# Patient Record
Sex: Female | Born: 1950 | Race: White | Hispanic: No | Smoking: Former smoker
Health system: Southern US, Community
[De-identification: ages and names within clinical notes are randomized; demographics above are authoritative.]

## PROBLEM LIST (undated history)

## (undated) DIAGNOSIS — I4891 Unspecified atrial fibrillation: Secondary | ICD-10-CM

## (undated) DIAGNOSIS — F419 Anxiety disorder, unspecified: Secondary | ICD-10-CM

## (undated) DIAGNOSIS — I495 Sick sinus syndrome: Secondary | ICD-10-CM

## (undated) DIAGNOSIS — C3490 Malignant neoplasm of unspecified part of unspecified bronchus or lung: Secondary | ICD-10-CM

## (undated) DIAGNOSIS — I219 Acute myocardial infarction, unspecified: Secondary | ICD-10-CM

## (undated) DIAGNOSIS — Z923 Personal history of irradiation: Secondary | ICD-10-CM

## (undated) DIAGNOSIS — J449 Chronic obstructive pulmonary disease, unspecified: Secondary | ICD-10-CM

## (undated) DIAGNOSIS — I2510 Atherosclerotic heart disease of native coronary artery without angina pectoris: Secondary | ICD-10-CM

## (undated) DIAGNOSIS — J45909 Unspecified asthma, uncomplicated: Secondary | ICD-10-CM

## (undated) DIAGNOSIS — C719 Malignant neoplasm of brain, unspecified: Secondary | ICD-10-CM

## (undated) DIAGNOSIS — T7840XA Allergy, unspecified, initial encounter: Secondary | ICD-10-CM

## (undated) DIAGNOSIS — E2740 Unspecified adrenocortical insufficiency: Secondary | ICD-10-CM

## (undated) DIAGNOSIS — R0602 Shortness of breath: Secondary | ICD-10-CM

## (undated) DIAGNOSIS — E782 Mixed hyperlipidemia: Secondary | ICD-10-CM

## (undated) DIAGNOSIS — I10 Essential (primary) hypertension: Secondary | ICD-10-CM

## (undated) DIAGNOSIS — I749 Embolism and thrombosis of unspecified artery: Secondary | ICD-10-CM

## (undated) HISTORY — DX: Allergy, unspecified, initial encounter: T78.40XA

## (undated) HISTORY — DX: Unspecified atrial fibrillation: I48.91

## (undated) HISTORY — DX: Sick sinus syndrome: I49.5

## (undated) HISTORY — DX: Essential (primary) hypertension: I10

## (undated) HISTORY — DX: Mixed hyperlipidemia: E78.2

## (undated) HISTORY — DX: Embolism and thrombosis of unspecified artery: I74.9

## (undated) HISTORY — DX: Malignant neoplasm of unspecified part of unspecified bronchus or lung: C34.90

## (undated) HISTORY — DX: Atherosclerotic heart disease of native coronary artery without angina pectoris: I25.10

## (undated) HISTORY — DX: Personal history of irradiation: Z92.3

## (undated) HISTORY — PX: TONSILLECTOMY AND ADENOIDECTOMY: SUR1326

## (undated) HISTORY — PX: APPENDECTOMY: SHX54

## (undated) HISTORY — DX: Chronic obstructive pulmonary disease, unspecified: J44.9

## (undated) HISTORY — DX: Acute myocardial infarction, unspecified: I21.9

## (undated) HISTORY — DX: Shortness of breath: R06.02

---

## 1973-10-08 HISTORY — PX: TOTAL ABDOMINAL HYSTERECTOMY: SHX209

## 1999-12-11 ENCOUNTER — Encounter: Payer: Self-pay | Admitting: *Deleted

## 1999-12-11 ENCOUNTER — Encounter: Admission: RE | Admit: 1999-12-11 | Discharge: 1999-12-11 | Payer: Self-pay | Admitting: *Deleted

## 2001-05-22 ENCOUNTER — Encounter: Payer: Self-pay | Admitting: *Deleted

## 2001-05-22 ENCOUNTER — Encounter: Admission: RE | Admit: 2001-05-22 | Discharge: 2001-05-22 | Payer: Self-pay | Admitting: Family Medicine

## 2006-11-19 ENCOUNTER — Inpatient Hospital Stay (HOSPITAL_COMMUNITY): Admission: EM | Admit: 2006-11-19 | Discharge: 2006-11-21 | Payer: Self-pay | Admitting: Emergency Medicine

## 2006-11-19 ENCOUNTER — Ambulatory Visit: Payer: Self-pay | Admitting: Internal Medicine

## 2006-11-19 ENCOUNTER — Encounter (INDEPENDENT_AMBULATORY_CARE_PROVIDER_SITE_OTHER): Payer: Self-pay | Admitting: Cardiology

## 2006-11-26 ENCOUNTER — Ambulatory Visit: Payer: Self-pay | Admitting: Cardiology

## 2006-11-28 ENCOUNTER — Ambulatory Visit: Payer: Self-pay

## 2006-12-04 ENCOUNTER — Ambulatory Visit: Payer: Self-pay | Admitting: Internal Medicine

## 2006-12-05 ENCOUNTER — Ambulatory Visit: Payer: Self-pay | Admitting: Cardiology

## 2006-12-19 ENCOUNTER — Ambulatory Visit: Payer: Self-pay | Admitting: Cardiology

## 2007-01-16 ENCOUNTER — Ambulatory Visit: Payer: Self-pay | Admitting: Cardiology

## 2007-02-21 ENCOUNTER — Emergency Department (HOSPITAL_COMMUNITY): Admission: EM | Admit: 2007-02-21 | Discharge: 2007-02-21 | Payer: Self-pay | Admitting: Emergency Medicine

## 2007-04-04 ENCOUNTER — Inpatient Hospital Stay (HOSPITAL_COMMUNITY): Admission: EM | Admit: 2007-04-04 | Discharge: 2007-04-08 | Payer: Self-pay | Admitting: Cardiology

## 2007-04-04 ENCOUNTER — Ambulatory Visit: Payer: Self-pay | Admitting: Internal Medicine

## 2007-04-30 ENCOUNTER — Ambulatory Visit: Payer: Self-pay

## 2007-05-16 ENCOUNTER — Ambulatory Visit: Payer: Self-pay | Admitting: Cardiovascular Disease

## 2007-05-25 ENCOUNTER — Ambulatory Visit: Payer: Self-pay | Admitting: Cardiovascular Disease

## 2007-05-29 ENCOUNTER — Ambulatory Visit: Payer: Self-pay | Admitting: Cardiovascular Disease

## 2007-07-03 ENCOUNTER — Ambulatory Visit: Payer: Self-pay | Admitting: Cardiovascular Disease

## 2007-08-22 ENCOUNTER — Ambulatory Visit: Payer: Self-pay | Admitting: Cardiovascular Disease

## 2007-09-11 ENCOUNTER — Ambulatory Visit: Payer: Self-pay | Admitting: Cardiology

## 2007-10-21 ENCOUNTER — Ambulatory Visit: Payer: Self-pay | Admitting: Cardiology

## 2007-10-30 ENCOUNTER — Ambulatory Visit (HOSPITAL_COMMUNITY): Admission: RE | Admit: 2007-10-30 | Discharge: 2007-10-30 | Payer: Self-pay | Admitting: Family Medicine

## 2007-11-18 ENCOUNTER — Ambulatory Visit: Payer: Self-pay | Admitting: Cardiology

## 2007-11-21 ENCOUNTER — Ambulatory Visit: Payer: Self-pay | Admitting: Cardiovascular Disease

## 2007-11-25 ENCOUNTER — Ambulatory Visit: Payer: Self-pay | Admitting: Cardiovascular Disease

## 2007-12-16 ENCOUNTER — Ambulatory Visit: Payer: Self-pay | Admitting: Cardiology

## 2007-12-20 ENCOUNTER — Ambulatory Visit: Payer: Self-pay | Admitting: *Deleted

## 2007-12-21 ENCOUNTER — Inpatient Hospital Stay (HOSPITAL_COMMUNITY): Admission: EM | Admit: 2007-12-21 | Discharge: 2007-12-21 | Payer: Self-pay | Admitting: Emergency Medicine

## 2007-12-25 ENCOUNTER — Ambulatory Visit: Payer: Self-pay | Admitting: Cardiology

## 2007-12-29 ENCOUNTER — Encounter (HOSPITAL_COMMUNITY): Admission: RE | Admit: 2007-12-29 | Discharge: 2008-01-28 | Payer: Self-pay | Admitting: Cardiology

## 2008-01-05 ENCOUNTER — Ambulatory Visit: Payer: Self-pay | Admitting: Cardiology

## 2008-01-14 ENCOUNTER — Ambulatory Visit: Payer: Self-pay | Admitting: Cardiology

## 2008-01-19 ENCOUNTER — Ambulatory Visit: Payer: Self-pay | Admitting: Cardiovascular Disease

## 2008-02-16 ENCOUNTER — Ambulatory Visit: Payer: Self-pay | Admitting: Cardiology

## 2008-02-19 ENCOUNTER — Ambulatory Visit: Payer: Self-pay | Admitting: Cardiovascular Disease

## 2008-03-15 ENCOUNTER — Ambulatory Visit: Payer: Self-pay | Admitting: Cardiology

## 2008-03-30 ENCOUNTER — Ambulatory Visit: Payer: Self-pay | Admitting: Cardiology

## 2008-03-30 ENCOUNTER — Inpatient Hospital Stay (HOSPITAL_COMMUNITY): Admission: EM | Admit: 2008-03-30 | Discharge: 2008-03-31 | Payer: Self-pay | Admitting: Emergency Medicine

## 2008-04-21 ENCOUNTER — Ambulatory Visit: Payer: Self-pay | Admitting: Cardiology

## 2008-05-12 ENCOUNTER — Ambulatory Visit: Payer: Self-pay | Admitting: Cardiology

## 2008-05-20 ENCOUNTER — Ambulatory Visit: Payer: Self-pay | Admitting: Cardiovascular Disease

## 2008-05-27 ENCOUNTER — Ambulatory Visit: Payer: Self-pay | Admitting: Cardiology

## 2008-06-07 ENCOUNTER — Ambulatory Visit: Payer: Self-pay | Admitting: Cardiology

## 2008-06-08 ENCOUNTER — Observation Stay (HOSPITAL_COMMUNITY): Admission: EM | Admit: 2008-06-08 | Discharge: 2008-06-08 | Payer: Self-pay | Admitting: Emergency Medicine

## 2008-06-17 ENCOUNTER — Ambulatory Visit: Payer: Self-pay | Admitting: Cardiology

## 2008-06-24 ENCOUNTER — Ambulatory Visit: Payer: Self-pay | Admitting: Cardiology

## 2008-06-28 ENCOUNTER — Encounter: Admission: RE | Admit: 2008-06-28 | Discharge: 2008-07-19 | Payer: Self-pay | Admitting: Physician Assistant

## 2008-06-29 ENCOUNTER — Emergency Department (HOSPITAL_COMMUNITY): Admission: EM | Admit: 2008-06-29 | Discharge: 2008-06-30 | Payer: Self-pay | Admitting: Emergency Medicine

## 2008-07-12 ENCOUNTER — Ambulatory Visit: Payer: Self-pay | Admitting: Cardiology

## 2008-08-05 ENCOUNTER — Ambulatory Visit: Payer: Self-pay | Admitting: Cardiology

## 2008-08-08 ENCOUNTER — Emergency Department (HOSPITAL_COMMUNITY): Admission: EM | Admit: 2008-08-08 | Discharge: 2008-08-08 | Payer: Self-pay | Admitting: Emergency Medicine

## 2008-08-10 ENCOUNTER — Emergency Department (HOSPITAL_COMMUNITY): Admission: EM | Admit: 2008-08-10 | Discharge: 2008-08-10 | Payer: Self-pay | Admitting: Emergency Medicine

## 2008-08-12 ENCOUNTER — Ambulatory Visit: Payer: Self-pay | Admitting: Cardiology

## 2008-08-17 ENCOUNTER — Ambulatory Visit: Payer: Self-pay | Admitting: Cardiology

## 2008-08-17 ENCOUNTER — Encounter (HOSPITAL_COMMUNITY): Admission: RE | Admit: 2008-08-17 | Discharge: 2008-09-16 | Payer: Self-pay | Admitting: Cardiology

## 2008-08-19 ENCOUNTER — Ambulatory Visit: Payer: Self-pay | Admitting: Cardiovascular Disease

## 2008-08-25 ENCOUNTER — Ambulatory Visit: Payer: Self-pay | Admitting: Cardiology

## 2008-09-01 ENCOUNTER — Ambulatory Visit: Payer: Self-pay | Admitting: Cardiology

## 2008-09-08 ENCOUNTER — Ambulatory Visit: Payer: Self-pay | Admitting: Cardiology

## 2008-09-13 ENCOUNTER — Ambulatory Visit: Payer: Self-pay | Admitting: Cardiology

## 2008-09-15 ENCOUNTER — Ambulatory Visit: Payer: Self-pay | Admitting: Internal Medicine

## 2008-11-01 ENCOUNTER — Ambulatory Visit: Payer: Self-pay | Admitting: Cardiology

## 2008-11-18 ENCOUNTER — Ambulatory Visit: Payer: Self-pay | Admitting: Cardiovascular Disease

## 2008-11-25 ENCOUNTER — Ambulatory Visit: Payer: Self-pay | Admitting: Cardiology

## 2008-11-25 DIAGNOSIS — I2510 Atherosclerotic heart disease of native coronary artery without angina pectoris: Secondary | ICD-10-CM | POA: Insufficient documentation

## 2008-11-25 DIAGNOSIS — I480 Paroxysmal atrial fibrillation: Secondary | ICD-10-CM | POA: Insufficient documentation

## 2008-11-29 ENCOUNTER — Ambulatory Visit: Payer: Self-pay | Admitting: Cardiology

## 2008-11-30 ENCOUNTER — Ambulatory Visit (HOSPITAL_COMMUNITY): Admission: RE | Admit: 2008-11-30 | Discharge: 2008-11-30 | Payer: Self-pay | Admitting: Family Medicine

## 2008-12-07 ENCOUNTER — Ambulatory Visit: Payer: Self-pay | Admitting: Internal Medicine

## 2009-01-06 ENCOUNTER — Ambulatory Visit: Payer: Self-pay | Admitting: Cardiology

## 2009-01-13 ENCOUNTER — Ambulatory Visit: Payer: Self-pay | Admitting: Cardiology

## 2009-01-24 ENCOUNTER — Encounter (INDEPENDENT_AMBULATORY_CARE_PROVIDER_SITE_OTHER): Payer: Self-pay | Admitting: *Deleted

## 2009-02-10 ENCOUNTER — Ambulatory Visit: Payer: Self-pay | Admitting: Cardiology

## 2009-02-18 ENCOUNTER — Encounter: Payer: Self-pay | Admitting: Cardiology

## 2009-02-23 ENCOUNTER — Ambulatory Visit: Payer: Self-pay | Admitting: Cardiovascular Disease

## 2009-03-10 ENCOUNTER — Ambulatory Visit: Payer: Self-pay | Admitting: Cardiology

## 2009-03-14 ENCOUNTER — Inpatient Hospital Stay (HOSPITAL_COMMUNITY): Admission: EM | Admit: 2009-03-14 | Discharge: 2009-03-15 | Payer: Self-pay | Admitting: Emergency Medicine

## 2009-03-14 ENCOUNTER — Ambulatory Visit: Payer: Self-pay | Admitting: Cardiology

## 2009-03-14 ENCOUNTER — Encounter (INDEPENDENT_AMBULATORY_CARE_PROVIDER_SITE_OTHER): Payer: Self-pay | Admitting: Internal Medicine

## 2009-03-31 ENCOUNTER — Emergency Department (HOSPITAL_COMMUNITY): Admission: EM | Admit: 2009-03-31 | Discharge: 2009-03-31 | Payer: Self-pay | Admitting: Emergency Medicine

## 2009-04-07 ENCOUNTER — Ambulatory Visit: Payer: Self-pay | Admitting: Cardiology

## 2009-04-18 ENCOUNTER — Telehealth (INDEPENDENT_AMBULATORY_CARE_PROVIDER_SITE_OTHER): Payer: Self-pay | Admitting: *Deleted

## 2009-04-20 ENCOUNTER — Telehealth (INDEPENDENT_AMBULATORY_CARE_PROVIDER_SITE_OTHER): Payer: Self-pay | Admitting: *Deleted

## 2009-04-25 ENCOUNTER — Ambulatory Visit: Payer: Self-pay | Admitting: Cardiology

## 2009-05-04 ENCOUNTER — Encounter: Payer: Self-pay | Admitting: Cardiovascular Disease

## 2009-05-05 ENCOUNTER — Encounter: Payer: Self-pay | Admitting: Cardiology

## 2009-05-23 ENCOUNTER — Encounter: Payer: Self-pay | Admitting: *Deleted

## 2009-05-25 ENCOUNTER — Encounter: Payer: Self-pay | Admitting: Physician Assistant

## 2009-05-25 ENCOUNTER — Ambulatory Visit: Payer: Self-pay | Admitting: Cardiovascular Disease

## 2009-05-25 ENCOUNTER — Ambulatory Visit: Payer: Self-pay | Admitting: Cardiology

## 2009-05-25 ENCOUNTER — Encounter: Payer: Self-pay | Admitting: Cardiology

## 2009-05-26 LAB — CONVERTED CEMR LAB
BUN: 16 mg/dL (ref 6–23)
Basophils Absolute: 0 10*3/uL (ref 0.0–0.1)
CO2: 31 meq/L (ref 19–32)
Calcium: 9.2 mg/dL (ref 8.4–10.5)
Creatinine, Ser: 0.8 mg/dL (ref 0.4–1.2)
Eosinophils Absolute: 0.3 10*3/uL (ref 0.0–0.7)
Eosinophils Relative: 2.8 % (ref 0.0–5.0)
GFR calc non Af Amer: 77.92 mL/min (ref >60)
Glucose, Bld: 111 mg/dL — ABNORMAL HIGH (ref 70–99)
Lymphocytes Relative: 16.6 % (ref 12.0–46.0)
Lymphs Abs: 2 10*3/uL (ref 0.7–4.0)
MCHC: 33.2 g/dL (ref 30.0–36.0)
Monocytes Absolute: 0.9 10*3/uL (ref 0.1–1.0)
Monocytes Relative: 7.1 % (ref 3.0–12.0)
Platelets: 176 10*3/uL (ref 150.0–400.0)
Prothrombin Time: 32.2 s — ABNORMAL HIGH (ref 9.1–11.7)
RBC: 4.51 M/uL (ref 3.87–5.11)
Sodium: 145 meq/L (ref 135–145)
WBC: 12.2 10*3/uL — ABNORMAL HIGH (ref 4.5–10.5)

## 2009-05-30 ENCOUNTER — Ambulatory Visit: Payer: Self-pay | Admitting: Cardiology

## 2009-05-31 ENCOUNTER — Inpatient Hospital Stay (HOSPITAL_BASED_OUTPATIENT_CLINIC_OR_DEPARTMENT_OTHER): Admission: RE | Admit: 2009-05-31 | Discharge: 2009-05-31 | Payer: Self-pay | Admitting: Cardiology

## 2009-05-31 ENCOUNTER — Ambulatory Visit: Payer: Self-pay | Admitting: Cardiology

## 2009-06-03 ENCOUNTER — Telehealth: Payer: Self-pay | Admitting: Cardiology

## 2009-06-15 ENCOUNTER — Ambulatory Visit: Payer: Self-pay | Admitting: Cardiovascular Disease

## 2009-06-15 ENCOUNTER — Ambulatory Visit: Payer: Self-pay | Admitting: Cardiology

## 2009-06-15 DIAGNOSIS — F17200 Nicotine dependence, unspecified, uncomplicated: Secondary | ICD-10-CM

## 2009-06-15 DIAGNOSIS — E663 Overweight: Secondary | ICD-10-CM | POA: Insufficient documentation

## 2009-06-15 LAB — CONVERTED CEMR LAB
ALT: 27 units/L (ref 0–35)
Alkaline Phosphatase: 62 units/L (ref 39–117)
Bilirubin, Direct: 0 mg/dL (ref 0.0–0.3)
HDL: 41.4 mg/dL (ref >39.00)
Total Protein: 6.4 g/dL (ref 6.0–8.3)
VLDL: 25 mg/dL (ref 0.0–40.0)

## 2009-06-17 ENCOUNTER — Encounter: Payer: Self-pay | Admitting: Cardiology

## 2009-06-17 ENCOUNTER — Ambulatory Visit: Payer: Self-pay | Admitting: Cardiology

## 2009-06-17 DIAGNOSIS — E785 Hyperlipidemia, unspecified: Secondary | ICD-10-CM

## 2009-07-28 ENCOUNTER — Encounter (INDEPENDENT_AMBULATORY_CARE_PROVIDER_SITE_OTHER): Payer: Self-pay | Admitting: Cardiology

## 2009-08-04 ENCOUNTER — Telehealth (INDEPENDENT_AMBULATORY_CARE_PROVIDER_SITE_OTHER): Payer: Self-pay | Admitting: *Deleted

## 2009-08-08 ENCOUNTER — Ambulatory Visit: Payer: Self-pay | Admitting: Cardiology

## 2009-08-08 LAB — CONVERTED CEMR LAB: POC INR: 3.3

## 2009-09-13 ENCOUNTER — Encounter: Payer: Self-pay | Admitting: Cardiology

## 2009-09-19 ENCOUNTER — Encounter (INDEPENDENT_AMBULATORY_CARE_PROVIDER_SITE_OTHER): Payer: Self-pay | Admitting: *Deleted

## 2009-09-19 ENCOUNTER — Encounter: Payer: Self-pay | Admitting: Cardiology

## 2009-09-19 LAB — CONVERTED CEMR LAB
ALT: 19 units/L
ALT: 19 units/L
Albumin: 4.3 g/dL
Cholesterol: 116 mg/dL
HDL: 41 mg/dL
LDL Cholesterol: 46 mg/dL
Triglycerides: 143 mg/dL

## 2009-09-20 LAB — CONVERTED CEMR LAB
Alkaline Phosphatase: 58 units/L (ref 39–117)
Cholesterol: 116 mg/dL (ref 0–200)
HDL: 41 mg/dL (ref >39)
Indirect Bilirubin: 0.3 mg/dL (ref 0.0–0.9)
Total Bilirubin: 0.4 mg/dL (ref 0.3–1.2)
Total CHOL/HDL Ratio: 2.8
Total Protein: 6.6 g/dL (ref 6.0–8.3)
VLDL: 29 mg/dL (ref 0–40)

## 2009-09-21 ENCOUNTER — Encounter (INDEPENDENT_AMBULATORY_CARE_PROVIDER_SITE_OTHER): Payer: Self-pay | Admitting: *Deleted

## 2009-09-22 ENCOUNTER — Encounter (INDEPENDENT_AMBULATORY_CARE_PROVIDER_SITE_OTHER): Payer: Self-pay | Admitting: Cardiology

## 2009-10-02 ENCOUNTER — Emergency Department (HOSPITAL_COMMUNITY): Admission: EM | Admit: 2009-10-02 | Discharge: 2009-10-02 | Payer: Self-pay | Admitting: Emergency Medicine

## 2009-10-03 ENCOUNTER — Emergency Department (HOSPITAL_COMMUNITY): Admission: EM | Admit: 2009-10-03 | Discharge: 2009-10-03 | Payer: Self-pay | Admitting: Emergency Medicine

## 2009-10-24 ENCOUNTER — Encounter (INDEPENDENT_AMBULATORY_CARE_PROVIDER_SITE_OTHER): Payer: Self-pay | Admitting: *Deleted

## 2009-10-26 ENCOUNTER — Encounter (INDEPENDENT_AMBULATORY_CARE_PROVIDER_SITE_OTHER): Payer: Self-pay | Admitting: Cardiology

## 2009-11-01 ENCOUNTER — Ambulatory Visit: Payer: Self-pay | Admitting: Cardiology

## 2009-11-01 DIAGNOSIS — J449 Chronic obstructive pulmonary disease, unspecified: Secondary | ICD-10-CM

## 2009-11-02 ENCOUNTER — Encounter: Payer: Self-pay | Admitting: Adult Health

## 2009-11-10 ENCOUNTER — Encounter: Payer: Self-pay | Admitting: Cardiology

## 2009-11-14 ENCOUNTER — Ambulatory Visit: Payer: Self-pay | Admitting: Cardiology

## 2009-11-14 LAB — CONVERTED CEMR LAB: POC INR: 2.9

## 2009-11-16 ENCOUNTER — Telehealth (INDEPENDENT_AMBULATORY_CARE_PROVIDER_SITE_OTHER): Payer: Self-pay | Admitting: *Deleted

## 2009-11-17 ENCOUNTER — Encounter (INDEPENDENT_AMBULATORY_CARE_PROVIDER_SITE_OTHER): Payer: Self-pay | Admitting: *Deleted

## 2009-11-25 ENCOUNTER — Encounter: Payer: Self-pay | Admitting: Cardiology

## 2009-11-25 ENCOUNTER — Ambulatory Visit: Payer: Self-pay | Admitting: Cardiology

## 2009-11-29 ENCOUNTER — Telehealth: Payer: Self-pay | Admitting: Cardiology

## 2009-11-29 ENCOUNTER — Ambulatory Visit: Payer: Self-pay | Admitting: Cardiology

## 2009-12-01 ENCOUNTER — Ambulatory Visit (HOSPITAL_COMMUNITY): Admission: RE | Admit: 2009-12-01 | Discharge: 2009-12-01 | Payer: Self-pay | Admitting: Cardiology

## 2009-12-05 ENCOUNTER — Encounter: Payer: Self-pay | Admitting: Cardiology

## 2009-12-05 ENCOUNTER — Ambulatory Visit: Payer: Self-pay | Admitting: Internal Medicine

## 2009-12-06 HISTORY — PX: INSERT / REPLACE / REMOVE PACEMAKER: SUR710

## 2009-12-08 LAB — CONVERTED CEMR LAB
BUN: 15 mg/dL (ref 6–23)
Chloride: 102 meq/L (ref 96–112)
Creatinine, Ser: 0.88 mg/dL (ref 0.40–1.20)
Eosinophils Absolute: 0.2 10*3/uL (ref 0.0–0.7)
Eosinophils Relative: 2 % (ref 0–5)
Glucose, Bld: 96 mg/dL (ref 70–99)
INR: 3.42 — ABNORMAL HIGH (ref <1.50)
Lymphocytes Relative: 20 % (ref 12–46)
Lymphs Abs: 2.2 10*3/uL (ref 0.7–4.0)
Monocytes Absolute: 0.7 10*3/uL (ref 0.1–1.0)
Monocytes Relative: 6 % (ref 3–12)
Neutro Abs: 8.3 10*3/uL — ABNORMAL HIGH (ref 1.7–7.7)
Neutrophils Relative %: 72 % (ref 43–77)
Platelets: 198 10*3/uL (ref 150–400)
RDW: 14.7 % (ref 11.5–15.5)
Sodium: 139 meq/L (ref 135–145)
WBC: 11.5 10*3/uL — ABNORMAL HIGH (ref 4.0–10.5)
aPTT: 47 s — ABNORMAL HIGH (ref 24–37)

## 2009-12-09 ENCOUNTER — Ambulatory Visit: Payer: Self-pay | Admitting: Internal Medicine

## 2009-12-09 ENCOUNTER — Inpatient Hospital Stay (HOSPITAL_COMMUNITY): Admission: RE | Admit: 2009-12-09 | Discharge: 2009-12-10 | Payer: Self-pay | Admitting: Internal Medicine

## 2009-12-10 ENCOUNTER — Encounter: Payer: Self-pay | Admitting: Internal Medicine

## 2009-12-19 ENCOUNTER — Ambulatory Visit: Payer: Self-pay | Admitting: Cardiology

## 2009-12-22 ENCOUNTER — Encounter: Payer: Self-pay | Admitting: Internal Medicine

## 2009-12-29 ENCOUNTER — Ambulatory Visit: Payer: Self-pay | Admitting: Cardiology

## 2009-12-29 ENCOUNTER — Ambulatory Visit: Payer: Self-pay | Admitting: Internal Medicine

## 2009-12-29 LAB — CONVERTED CEMR LAB: POC INR: 2.5

## 2010-01-23 ENCOUNTER — Ambulatory Visit: Payer: Self-pay | Admitting: Cardiovascular Disease

## 2010-01-23 LAB — CONVERTED CEMR LAB: POC INR: 2.5

## 2010-02-20 ENCOUNTER — Ambulatory Visit: Payer: Self-pay | Admitting: Cardiology

## 2010-02-21 ENCOUNTER — Encounter (INDEPENDENT_AMBULATORY_CARE_PROVIDER_SITE_OTHER): Payer: Self-pay | Admitting: *Deleted

## 2010-03-08 ENCOUNTER — Ambulatory Visit: Payer: Self-pay | Admitting: Cardiology

## 2010-03-15 ENCOUNTER — Ambulatory Visit: Payer: Self-pay | Admitting: Internal Medicine

## 2010-03-15 DIAGNOSIS — Z950 Presence of cardiac pacemaker: Secondary | ICD-10-CM | POA: Insufficient documentation

## 2010-03-16 ENCOUNTER — Encounter: Payer: Self-pay | Admitting: Internal Medicine

## 2010-03-27 ENCOUNTER — Encounter (INDEPENDENT_AMBULATORY_CARE_PROVIDER_SITE_OTHER): Payer: Self-pay | Admitting: *Deleted

## 2010-04-06 ENCOUNTER — Encounter (INDEPENDENT_AMBULATORY_CARE_PROVIDER_SITE_OTHER): Payer: Self-pay | Admitting: Pharmacist

## 2010-04-13 ENCOUNTER — Ambulatory Visit: Payer: Self-pay | Admitting: Cardiology

## 2010-04-13 DIAGNOSIS — I495 Sick sinus syndrome: Secondary | ICD-10-CM | POA: Insufficient documentation

## 2010-04-18 ENCOUNTER — Encounter (INDEPENDENT_AMBULATORY_CARE_PROVIDER_SITE_OTHER): Payer: Self-pay

## 2010-05-24 ENCOUNTER — Encounter (INDEPENDENT_AMBULATORY_CARE_PROVIDER_SITE_OTHER): Payer: Self-pay | Admitting: Pharmacist

## 2010-05-24 ENCOUNTER — Telehealth (INDEPENDENT_AMBULATORY_CARE_PROVIDER_SITE_OTHER): Payer: Self-pay

## 2010-06-14 ENCOUNTER — Encounter (HOSPITAL_COMMUNITY): Admission: RE | Admit: 2010-06-14 | Discharge: 2010-07-14 | Payer: Self-pay | Admitting: Orthopaedic Surgery

## 2010-06-16 ENCOUNTER — Telehealth: Payer: Self-pay | Admitting: Internal Medicine

## 2010-06-21 ENCOUNTER — Ambulatory Visit: Payer: Self-pay | Admitting: Cardiology

## 2010-06-21 LAB — CONVERTED CEMR LAB: POC INR: 2.1

## 2010-07-19 ENCOUNTER — Ambulatory Visit: Payer: Self-pay | Admitting: Cardiology

## 2010-07-19 LAB — CONVERTED CEMR LAB: POC INR: 2.4

## 2010-08-17 ENCOUNTER — Ambulatory Visit: Payer: Self-pay | Admitting: Cardiology

## 2010-09-14 ENCOUNTER — Ambulatory Visit: Payer: Self-pay | Admitting: Cardiology

## 2010-09-14 LAB — CONVERTED CEMR LAB: POC INR: 2.2

## 2010-10-16 ENCOUNTER — Ambulatory Visit: Admission: RE | Admit: 2010-10-16 | Discharge: 2010-10-16 | Payer: Self-pay | Source: Home / Self Care

## 2010-10-16 LAB — CONVERTED CEMR LAB: POC INR: 2.3

## 2010-10-29 ENCOUNTER — Encounter: Payer: Self-pay | Admitting: Internal Medicine

## 2010-10-31 ENCOUNTER — Other Ambulatory Visit (HOSPITAL_COMMUNITY): Payer: Self-pay | Admitting: Family Medicine

## 2010-10-31 ENCOUNTER — Other Ambulatory Visit (HOSPITAL_COMMUNITY): Payer: Self-pay | Admitting: *Deleted

## 2010-10-31 DIAGNOSIS — Z139 Encounter for screening, unspecified: Secondary | ICD-10-CM

## 2010-11-01 ENCOUNTER — Ambulatory Visit: Admit: 2010-11-01 | Payer: Self-pay | Admitting: Cardiology

## 2010-11-07 NOTE — Medication Information (Signed)
Summary: ccr-lr at pacer appt  Anticoagulant Therapy  Managed by: Vashti Hey, RN PCP: Dr. Margit Hanks MD: Diona Browner MD, Remi Deter Indication 1: Atrial Fibrillation (ICD-427.31) Lab Used: [facility] [place] Site: [place] INR POC 2.5  Dietary changes: no    Health status changes: no    Bleeding/hemorrhagic complications: no    Recent/future hospitalizations: no    Any changes in medication regimen? no    Recent/future dental: no  Any missed doses?: no       Is patient compliant with meds? yes       Allergies: No Known Drug Allergies  Anticoagulation Management History:      The patient is taking warfarin and comes in today for a routine follow up visit.  Negative risk factors for bleeding include an age less than 4 years old.  The bleeding index is 'low risk'.  Negative CHADS2 values include Age > 21 years old.  The start date was 11/01/2006.  Her last INR was 3.42.  Anticoagulation responsible provider: Diona Browner MD, Remi Deter.  INR POC: 2.5.  Cuvette Lot#: [id number].  Exp: 01/2011.    Anticoagulation Management Assessment/Plan:      The patient's current anticoagulation dose is Coumadin 5 mg tabs: Take 1 tablet by mouth as directed.  The target INR is 2 - 3.  The next INR is due 01/19/2010.  Anticoagulation instructions were given to patient.  Results were reviewed/authorized by Vashti Hey, RN.  She was notified by Vashti Hey RN.         Prior Anticoagulation Instructions: INR 3.5 Hold coumadin tonight then decrease dose to 5mg  once daily except 2.5mg  on Mondays  Current Anticoagulation Instructions: INR 2.5 Continue coumadin 5mg  once daily except 2.5mg  on Mondays

## 2010-11-07 NOTE — Medication Information (Signed)
Summary: 2 WK PROTIME PER CHECKUOT ON 11/01/09/TG  Anticoagulant Therapy  Managed by: Vashti Hey, RN PCP: Dr. Aleen Campi Supervising MD: Dietrich Pates MD, Molly Maduro Indication 1: Atrial Fibrillation (ICD-427.31) Lab Used: [facility] [place] Site: [place] INR POC 2.9  Dietary changes: no    Health status changes: no    Bleeding/hemorrhagic complications: no    Recent/future hospitalizations: no    Any changes in medication regimen? no    Recent/future dental: no  Any missed doses?: no       Is patient compliant with meds? yes       Allergies: No Known Drug Allergies  Anticoagulation Management History:      The patient is taking warfarin and comes in today for a routine follow up visit.  Negative risk factors for bleeding include an age less than 58 years old.  The bleeding index is 'low risk'.  Negative CHADS2 values include Age > 62 years old.  The start date was 11/01/2006.  Her last INR was 3.1 ratio.  Anticoagulation responsible provider: Dietrich Pates MD, Molly Maduro.  INR POC: 2.9.  Cuvette Lot#: [id number].  Exp: 01/2011.    Anticoagulation Management Assessment/Plan:      The patient's current anticoagulation dose is Coumadin 5 mg tabs: Take 1 tablet by mouth as directed.  The target INR is 2 - 3.  The next INR is due 12/12/2009.  Anticoagulation instructions were given to patient.  Results were reviewed/authorized by Vashti Hey, RN.  She was notified by Vashti Hey RN.         Prior Anticoagulation Instructions: INR 3.6 TODAY HOLD TODAYS DOSE THEN RESUME 5MG  DAILY   Current Anticoagulation Instructions: INR 2.9 Continue coumadin 5mg  once daily  Continue greens

## 2010-11-07 NOTE — Assessment & Plan Note (Signed)
Summary: F3M   Visit Type:  Follow-up Primary Provider:  Dr. Aleen Campi  CC:  no cardiology complaints .  History of Present Illness: [name] returns today for followup of her PAF and symptomatic tachy-brady syndrome.  The patient has  had a h/o CAD but no significant residual stenosis and preserved LV function.  She has begun having breakthrough palpitations as well as dizzy spells without frank syncope.  She wore a cardiac monitor and was found to have long pauses and rapid atrial fib with the pauses greater than 3 seconds and the ventricular rate over 120/min.  This was despite therapy with beta blockers and flecainide. She underwent PPM insertion several months ago and returns today for followup.  Her symptoms are much improved.  No c/p or sob. Minimal palpitations.  Current Medications (verified): 1)  Amlodipine Besylate 5 Mg Tabs (Amlodipine Besylate) .... Take 1 Tablet By Mouth Once A Day 2)  Coumadin 5 Mg Tabs (Warfarin Sodium) .... Take 1 Tablet By Mouth As Directed 3)  Klor-Con M20 20 Meq Cr-Tabs (Potassium Chloride Crys Cr) .... Take 1 Tablet By Mouth Two Times A Day 4)  Lisinopril 20 Mg Tabs (Lisinopril) .... Take 1 Tablet By Mouth Once A Day 5)  Metoprolol Tartrate 50 Mg Tabs (Metoprolol Tartrate) .... Take 1 Tablet By Mouth Three Times A Day 6)  Hydrochlorothiazide 25 Mg Tabs (Hydrochlorothiazide) .... Take One Daily 7)  Nitroglycerin 0.4 Mg Subl (Nitroglycerin) .... Take As Needed 8)  Albuterol Sulfate (5 Mg/ml) 0.5% Nebu (Albuterol Sulfate) .... As Needed 9)  Aspir-Low 81 Mg Tbec (Aspirin) .... Take One Daily 10)  Flecainide Acetate 100 Mg Tabs (Flecainide Acetate) .... Take 1 By Mouth Two Times A Day 11)  Antivert 25 Mg Tabs (Meclizine Hcl) .... Take 1 Tablet By Mouth Three Times A Day As Needed 12)  Crestor 40 Mg Tabs (Rosuvastatin Calcium) .... Take 1 Tablet By Mouth Once Daily 13)  Albuterol Sulfate (2.5 Mg/36ml) 0.083% Nebu (Albuterol Sulfate) .... Take 1 Treatment Three  Times A Day  Allergies (verified): No Known Drug Allergies  Past History:  Past Medical History: Last updated: 11/25/2008 Atrial Fibrillation CAD C O P D Hyperlipidemia Hypertension Myocardial Infarction  Past Surgical History: Last updated: 11/25/2008 Appendectomy TAH  Review of Systems  The patient denies chest pain, syncope, dyspnea on exertion, and peripheral edema.    Vital Signs:  Patient profile:   60 year old female Weight:      198 pounds BMI:     36.35 Pulse rate:   60 / minute BP sitting:   131 / 65  (right arm)  Vitals Entered By: Dreama Saa, CNA (March 15, 2010 1:43 PM)  Physical Exam  General:  Obese, well developed, well nourished, in no acute distress.  HEENT: normal Neck: supple. No JVD. Carotids 2+ bilaterally no bruits Cor: RRR no rubs, gallops or murmur Lungs: CTA with minimal basilar wheezes and rales. Well healed PPM incision. Ab: Obese, soft, nontender. nondistended. No HSM. Good bowel sounds Ext: warm. no cyanosis, clubbing or edema Neuro: alert and oriented. Grossly nonfocal. affect pleasant    PPM Specifications Following MD:  Lewayne Bunting, MD     PPM Vendor:  [employer] Jude     PPM Serial Number:  [id number] PPM DOI:  12/09/2009     PPM Implanting MD:  Lewayne Bunting, MD  Lead 1    Location: RA     DOI: 12/09/2009     Model #: [id number]  Serial #: [id number]     Status: active Lead 2    Location: RV     DOI: 12/09/2009     Model #: [id number]     Serial #: [id number]     Status: active  Magnet Response Rate:  BOL 100 ERI 85  Indications:  Tachy-brady syndrome   PPM Follow Up Remote Check?  No Battery Voltage:  2.98 V     Battery Est. Longevity:  9.3 years     Pacer Dependent:  No       PPM Device Measurements Atrium  Amplitude: 5 mV, Impedance: 460 ohms, Threshold: 0.5 V at 0.5 msec Right Ventricle  Amplitude: 12 mV, Impedance: 580 ohms, Threshold: 0.75 V at 0.5 msec  Episodes MS Episodes:  0     Percent Mode Switch:  0     Coumadin:   Yes Atrial Pacing:  66%     Ventricular Pacing:  <1%  Parameters Mode:  DDDR     Lower Rate Limit:  60     Upper Rate Limit:  120 Paced AV Delay:  200     Sensed AV Delay:  200 Next Cardiology Appt Due:  12/07/2010 Tech Comments:  Rate response activated.  RA reprogrammed for chronic thresholds and A cap confirm on.  ROV 3/12 with Dr. [name] in [facility]. Altha Harm, LPN  March 15, 6577 1:54 PM  MD Comments:  Agree with above.  Impression & Recommendations:  Problem # 1:  ATRIAL FIBRILLATION (ICD-427.31) She has had no atrial fib. since her PPM was interogated.  Continue meds as below. Her updated medication list for this problem includes:    Coumadin 5 Mg Tabs (Warfarin sodium) .[name]... Take 1 tablet by mouth as directed    Metoprolol Tartrate 50 Mg Tabs (Metoprolol tartrate) .[name]... Take 1 tablet by mouth three times a day    Aspir-low 81 Mg Tbec (Aspirin) .[name]... Take one daily    Flecainide Acetate 100 Mg Tabs (Flecainide acetate) .[name]... Take 1 by mouth two times a day  Problem # 2:  CARDIAC PACEMAKER IN SITU (ICD-V45.01) Her device is working normally.  Will recheck in several months.  Problem # 3:  TOBACCO ABUSE (ICD-305.1) I discussed the importance of smoking cessation.  She will try to cut back.  Problem # 4:  OVERWEIGHT/OBESITY (ICD-278.02) I discussed the importance of weight loss.  Ways to reduce her calorie intake were given.  Patient Instructions: 1)  Your physician recommends that you schedule a follow-up appointment in: 9 months

## 2010-11-07 NOTE — Progress Notes (Signed)
Summary: critical tracing from [employer]  Phone Note Outgoing Call   Call placed by: Teressa Lower RN,  November 16, 2009 4:38 PM Call placed to: Patient Details for Reason: s/s of arrthymia  Summary of Call: [employer] sent physcian notification for pauses on pt.  I called and pt has been feeling hot, short of breath , light headedness, nausea.  On 11/15/2009 the day of the critical notification pt had new and unusual pain behind right breast.  I showed the tracing to Dr. Dietrich Pates.  He stopped her metoprolol tart 50mg  three times a day, pt is scheduled to see KL on 11/29/2009.  I instructed no driving  and call for any further symptoms. If you have any other recommendations, please give me orders. Initial call taken by: Teressa Lower RN,  November 16, 2009 4:41 PM  Follow-up for Phone Call        Suggest cut metoprolol to 25 mg two times a day instead of total cessation, since also on Flecainide.  She has seen Dr. [name] for EP evaluation before.  Keep regular scheduled visit with [place] - can be referred back to Dr. [name] if needed. Follow-up by: Loreli Slot, MD, [facility],  November 17, 2009 11:58 AM  Additional Follow-up for Phone Call Additional follow up Details #1::        appt to see Dr. [name] 12/02/2009 11:15pm Additional Follow-up by: Teressa Lower RN,  November 17, 2009 12:56 PM    New/Updated Medications: METOPROLOL TARTRATE 25 MG TABS (METOPROLOL TARTRATE) Take one tablet by mouth twice a day

## 2010-11-07 NOTE — Miscellaneous (Signed)
Summary: labs lipid,liver 09/19/2009  Clinical Lists Changes  Observations: Added new observation of ALBUMIN: 4.3 g/dL (16/07/9603 54:09) Added new observation of PROTEIN, TOT: 6.6 g/dL (81/19/1478 29:56) Added new observation of SGPT (ALT): 19 units/L (09/19/2009 16:54) Added new observation of SGOT (AST): 17 units/L (09/19/2009 16:54) Added new observation of ALK PHOS: 58 units/L (09/19/2009 16:54) Added new observation of BILI DIRECT: 0.1 mg/dL (21/30/8657 84:69) Added new observation of LDL: 46 mg/dL (62/95/2841 32:44) Added new observation of HDL: 41 mg/dL (10/10/7251 66:44) Added new observation of TRIGLYC TOT: 143 mg/dL (03/47/4259 56:38) Added new observation of CHOLESTEROL: 116 mg/dL (75/64/3329 51:88)

## 2010-11-07 NOTE — Assessment & Plan Note (Signed)
Summary: rov more then 3 sec pauses   Visit Type:  Follow-up Primary Provider:  Dr. Aleen Campi  CC:  no complaints today.  History of Present Illness: [name] returns today for followup of her PAF and symptomatic tachy-brady syndrome.  The patient has  had a h/o CAD but no significant residual stenosis and preserved LV function.  She has begun having breakthrough palpitations as well as dizzy spells without frank syncope.  She wore a cardiac monitor and was found to have long pauses and rapid atrial fib with the pauses greater than 3 seconds and the ventricular rate over 120/min.  This was despite therapy with beta blockers and flecainide.  She is referred today for additional evaluation.  Current Medications (verified): 1)  Amlodipine Besylate 5 Mg Tabs (Amlodipine Besylate) .... Take 1 Tablet By Mouth Once A Day 2)  Coumadin 5 Mg Tabs (Warfarin Sodium) .... Take 1 Tablet By Mouth As Directed 3)  Klor-Con M20 20 Meq Cr-Tabs (Potassium Chloride Crys Cr) .... Take 1 Tablet By Mouth Two Times A Day 4)  Lisinopril 20 Mg Tabs (Lisinopril) .... Take 1 Tablet By Mouth Once A Day 5)  Metoprolol Tartrate 50 Mg Tabs (Metoprolol Tartrate) .... Take 1 Tablet By Mouth Three Times A Day 6)  Hydrochlorothiazide 25 Mg Tabs (Hydrochlorothiazide) .... Take One Daily 7)  Nitroglycerin 0.4 Mg Subl (Nitroglycerin) .... Take As Needed 8)  Albuterol Sulfate (5 Mg/ml) 0.5% Nebu (Albuterol Sulfate) .... As Needed 9)  Aspir-Low 81 Mg Tbec (Aspirin) .... Take One Daily 10)  Flecainide Acetate 100 Mg Tabs (Flecainide Acetate) .... Take 1 By Mouth Two Times A Day 11)  Antivert 25 Mg Tabs (Meclizine Hcl) .... Take 1 Tablet By Mouth Three Times A Day As Needed 12)  Crestor 40 Mg Tabs (Rosuvastatin Calcium) .... Take 1 Tablet By Mouth Once Daily 13)  Albuterol Sulfate (2.5 Mg/45ml) 0.083% Nebu (Albuterol Sulfate) .... Take 1 Treatment Three Times A Day 14)  Metoprolol Tartrate 25 Mg Tabs (Metoprolol Tartrate) .... Take One  Tablet By Mouth Twice A Day  Allergies (verified): No Known Drug Allergies  Past History:  Past Medical History: Last updated: 11/25/2008 Atrial Fibrillation CAD C O P D Hyperlipidemia Hypertension Myocardial Infarction  Past Surgical History: Last updated: 11/25/2008 Appendectomy TAH  Family History: Last updated: 11/25/2008 Family History of Coronary Artery Disease  Social History: Last updated: 11/25/2008 Tobacco Use - Yes.  Alcohol Use - no  Review of Systems       All systems reviewed and negative except as noted in the HPI.  Vital Signs:  Patient profile:   60 year old female Weight:      201 pounds Pulse rate:   57 / minute BP sitting:   130 / 58  (right arm)  Vitals Entered By: Dreama Saa, CNA (December 05, 2009 10:04 AM)  Physical Exam  General:  Obese, well developed, well nourished, in no acute distress.  HEENT: normal Neck: supple. No JVD. Carotids 2+ bilaterally no bruits Cor: RRR no rubs, gallops or murmur Lungs: CTA with minimal basilar wheezes and rales. Ab: Obese, soft, nontender. nondistended. No HSM. Good bowel sounds Ext: warm. no cyanosis, clubbing or edema Neuro: alert and oriented. Grossly nonfocal. affect pleasant    Event Monitor  Procedure date:  12/05/2009  Findings:      Normal sinus rhythm.  Pauses of over 3 seconds. Atrial fib with a RVR over 120/min.  Impression & Recommendations:  Problem # 1:  ATRIAL FIBRILLATION (ICD-427.31)  The patient has persistent symptoms and now brady and tachy with her atrial fib.  I have recommended proceeding with PPM.  The risks/benefits/goals/expectations of PPM have been discussed with the patient and she wishes to proceed. After her PPM, she will require uptitration of her AV nodal blocking drugs and will consider switching heart medications. Her updated medication list for this problem includes:    Coumadin 5 Mg Tabs (Warfarin sodium) .[name]... Take 1 tablet by mouth as directed     Metoprolol Tartrate 50 Mg Tabs (Metoprolol tartrate) .[name]... Take 1 tablet by mouth three times a day    Aspir-low 81 Mg Tbec (Aspirin) .[name]... Take one daily    Flecainide Acetate 100 Mg Tabs (Flecainide acetate) .[name]... Take 1 by mouth two times a day    Metoprolol Tartrate 25 Mg Tabs (Metoprolol tartrate) .[name]... Take one tablet by mouth twice a day  Orders: T-Basic Metabolic Panel [phone]) T-CBC w/Diff [phone]) T-Protime, Auto ([id number]) T-PTT ([id number]) Bi-V Pacer (Bi-V Pacer)  Problem # 2:  OVERWEIGHT/OBESITY (ICD-278.02) I have encouraged her reduced by mouth intake and exercise.  Problem # 3:  CORONARY ATHEROSCLEROSIS NATIVE CORONARY ARTERY (ICD-414.01) She has no symptoms at this time. Her updated medication list for this problem includes:    Amlodipine Besylate 5 Mg Tabs (Amlodipine besylate) .[name]... Take 1 tablet by mouth once a day    Coumadin 5 Mg Tabs (Warfarin sodium) .[name]... Take 1 tablet by mouth as directed    Lisinopril 20 Mg Tabs (Lisinopril) .[name]... Take 1 tablet by mouth once a day    Metoprolol Tartrate 50 Mg Tabs (Metoprolol tartrate) .[name]... Take 1 tablet by mouth three times a day    Nitroglycerin 0.4 Mg Subl (Nitroglycerin) .[name]... Take as needed    Aspir-low 81 Mg Tbec (Aspirin) .[name]... Take one daily    Metoprolol Tartrate 25 Mg Tabs (Metoprolol tartrate) .[name]... Take one tablet by mouth twice a day  Patient Instructions: 1)  Your physician recommends that you schedule a follow-up appointment in: after pacemaker 2)  Your physician has recommended that you have a pacemaker inserted.  A pacemaker is a small device that is placed under the skin of your chest or abdomen to help control abnormal heart rhythms. This device uses electrical pulses to prompt the heart to beat at a normal rate. Pacemakers are used to treat heart rhythms that are too slow. Wires (leads) are attached to the pacemaker that goes into the chambers of your heart. This is done in the hospital and  usually requires an overnight stay. Please see the instruction sheet given to you today for more information. 3)  You are scheduled for 03-04-2011for pacemaker insertion. Be there at 1:30pm.  4)  Your physician recommends that you return for lab work in: Today  Appended Document: rov more then 3 sec pauses All notes faxed to EP lab [phone]) and short stay ([phone]) for pacer placement scheduled for 12-26-09 @ 3:30 with Dr. [name].        {USER.REALNAME}  {DATETIMESTAMP()}

## 2010-11-07 NOTE — Medication Information (Signed)
Summary: ccr-lr  Anticoagulant Therapy  Managed by: Vashti Hey, RN PCP: Dr. Margit Hanks MD: Dietrich Pates MD, Molly Maduro Indication 1: Atrial Fibrillation (ICD-427.31) Lab Used: [facility] [place] Site: [place] INR POC 3.6  Dietary changes: no    Health status changes: no    Bleeding/hemorrhagic complications: no    Recent/future hospitalizations: no    Any changes in medication regimen? yes       Details: takin pain pills for knee  3 or more a day x 2 weeks  Recent/future dental: no  Any missed doses?: no       Is patient compliant with meds? yes       Allergies: No Known Drug Allergies  Anticoagulation Management History:      The patient is taking warfarin and comes in today for a routine follow up visit.  Negative risk factors for bleeding include an age less than 59 years old.  The bleeding index is 'low risk'.  Negative CHADS2 values include Age > 68 years old.  The start date was 11/01/2006.  Her last INR was 3.42.  Anticoagulation responsible provider: Dietrich Pates MD, Molly Maduro.  INR POC: 3.6.  Cuvette Lot#: [id number].  Exp: 01/2011.    Anticoagulation Management Assessment/Plan:      The patient's current anticoagulation dose is Coumadin 5 mg tabs: Take 1 tablet by mouth as directed.  The target INR is 2 - 3.  The next INR is due 03/08/2010.  Anticoagulation instructions were given to patient.  Results were reviewed/authorized by Vashti Hey, RN.  She was notified by Vashti Hey RN.         Prior Anticoagulation Instructions: INR 2.5 Continue coumadin 5mg  once daily except 2.5mg  on Mondays  Current Anticoagulation Instructions: INR 3.6 Hold coumadin tonight then decrease dose to 5mg  once daily except 2.5mg  on Mondays and Thursdays

## 2010-11-07 NOTE — Miscellaneous (Signed)
Summary: Device preload  Clinical Lists Changes  Observations: Added new observation of PPM INDICATN: Tachy-brady syndrome (12/22/2009 13:35) Added new observation of MAGNET RTE: BOL 100 ERI 85 (12/22/2009 13:35) Added new observation of PPMLEADSTAT2: active (12/22/2009 13:35) Added new observation of PPMLEADSER2: [id number] (12/22/2009 13:35) Added new observation of PPMLEADMOD2: [id number] (12/22/2009 13:35) Added new observation of PPMLEADLOC2: RV (12/22/2009 13:35) Added new observation of PPMLEADSTAT1: active (12/22/2009 13:35) Added new observation of PPMLEADSER1: [id number] (12/22/2009 13:35) Added new observation of PPMLEADMOD1: [id number] (12/22/2009 13:35) Added new observation of PPMLEADLOC1: RA (12/22/2009 13:35) Added new observation of PPMLEADDOI2: 12/09/2009 (12/22/2009 13:35) Added new observation of PPMLEADDOI1: 12/09/2009 (12/22/2009 13:35) Added new observation of PPM IMP MD: Lewayne Bunting, MD (12/22/2009 13:35) Added new observation of PPM DOI: 12/09/2009 (12/22/2009 13:35) Added new observation of PPM SERL#: [id number]  (12/22/2009 13:35) Added new observation of PACEMAKERMFG: St Jude  (12/22/2009 13:35) Added new observation of PACEMAKER MD: Lewayne Bunting, MD  (12/22/2009 13:35)      PPM Specifications Following MD:  Lewayne Bunting, MD     PPM Vendor:  [employer]     PPM Serial Number:  [id number] DOI:  12/09/2009     PPM Implanting MD:  Lewayne Bunting, MD  Lead 1    Location: [facility]     DOI: 12/09/2009     Model #: [id number]     Serial #: [id number]     Status: active Lead 2    Location: RV     DOI: 12/09/2009     Model #: [id number]     Serial #: [id number]     Status: active  Magnet Response Rate:  BOL 100 ERI 85  Indications:  Tachy-brady syndrome

## 2010-11-07 NOTE — Progress Notes (Signed)
Summary: refill med  Phone Note Refill Request Call back at Home Phone [phone] Message from:  Patient on June 16, 2010 3:22 PM  Refills Requested: Medication #1:  FLECAINIDE ACETATE 100 MG TABS Take 1 by mouth two times a day [facility] [phone]   Method Requested: Fax to Local Pharmacy Initial call taken by: Lorne Skeens,  June 16, 2010 3:22 PM    Prescriptions: FLECAINIDE ACETATE 100 MG TABS (FLECAINIDE ACETATE) Take 1 by mouth two times a day  #60 x 6   Entered by:   Laurance Flatten CMA   Authorized by:   Laren Boom, MD, [facility]   Signed by:   Laurance Flatten CMA on 06/16/2010   Method used:   Electronically to        [facility] (retail)       [address]       [place], [place]  [address]       Ph: [phone]       Fax: [phone]   RxID:   [id number]

## 2010-11-07 NOTE — Assessment & Plan Note (Signed)
Summary: 1 MTH F/U PER CHECKOUT ON 11/01/09/TG   Visit Type:  Follow-up Primary Provider:  Dr. Aleen Campi  CC:  palpitations.  History of Present Illness: [name] is a 60 CF patient of Dr. Diona Browner who is here for follow-up appointment. She has a history of Atrial fibrillation and is followed by Dr. [name].  On last visit, she was complaining of palpatations, racing HR, feeling bad.  I placed a cardionet on the patient to evaluate for tachyarrythmias.  Review of notes from nurses, showed that she had a sinus arrest (3.7 sec pause) that was symptomatic with chest pain, sob and flushing on 11/14/2009.  Dr. Dietrich Pates reviewed the strip and her metoprolol 50mg  three times a day was discontinued.  The information was routed to Dr. Diona Browner who ordered her to restart Metoprolol 25mg  two times a day on Nov 16, 2009.  Since restarting her metoprolol at the lower dose, she has complaints of HR racing with associated dizziness.  This occurs when she is laying on the couch or walking in her home.    Review of cardionet, since restarting Metoprolol at lower dose of atrial tachycardia HR > 100 bpm.  The report HAS NOT been officially read by cardiologist at the time of this documentation. The records are to be reevaluated by Dr. Diona Browner when he is in the office tomorrow.  I discussed this with Dr. Dietrich Pates who is here in the clinic today.  He states that Dr. Diona Browner should follow-up with the patient to make more recommendations, as he follows her primarily.  I asked if I should make sure that Dr. [name] sees her, but he did not think this was necessary.  I called Dr. Diona Browner to inform him of the patien'ts symptoms.  He orginially suggested that she be placed on Metoprolol 25mg  three times a day.  I informed him that she was placed on his schedule for tomorrow 11/30/2009 for him to see on follow-up. Althought he felt this was unnessary as we have just seen her today, he agreed to see her and review the cardionet.      I instructed the patient that she would return to see Dr. Diona Browner tomorrow and have made the cardionet documentation available for his review.  Current Medications (verified): 1)  Amlodipine Besylate 5 Mg Tabs (Amlodipine Besylate) .... Take 1 Tablet By Mouth Once A Day 2)  Coumadin 5 Mg Tabs (Warfarin Sodium) .... Take 1 Tablet By Mouth As Directed 3)  Klor-Con M20 20 Meq Cr-Tabs (Potassium Chloride Crys Cr) .... Take 1 Tablet By Mouth Two Times A Day 4)  Lisinopril 20 Mg Tabs (Lisinopril) .... Take 1 Tablet By Mouth Once A Day 5)  Metoprolol Tartrate 50 Mg Tabs (Metoprolol Tartrate) .... Take 1 Tablet By Mouth Three Times A Day 6)  Hydrochlorothiazide 25 Mg Tabs (Hydrochlorothiazide) .... Take One Daily 7)  Nitroglycerin 0.4 Mg Subl (Nitroglycerin) .... Take As Needed 8)  Albuterol Sulfate (5 Mg/ml) 0.5% Nebu (Albuterol Sulfate) .... As Needed 9)  Aspir-Low 81 Mg Tbec (Aspirin) .... Take One Daily 10)  Flecainide Acetate 100 Mg Tabs (Flecainide Acetate) .... Take 1 By Mouth Two Times A Day 11)  Antivert 25 Mg Tabs (Meclizine Hcl) .... Take 1 Tablet By Mouth Three Times A Day As Needed 12)  Crestor 40 Mg Tabs (Rosuvastatin Calcium) .... Take 1 Tablet By Mouth Once Daily 13)  Albuterol Sulfate (2.5 Mg/20ml) 0.083% Nebu (Albuterol Sulfate) .... Take 1 Treatment Three Times A Day 14)  Metoprolol Tartrate  25 Mg Tabs (Metoprolol Tartrate) .... Take One Tablet By Mouth Twice A Day  Allergies (verified): No Known Drug Allergies  Vital Signs:  Patient profile:   60 year old female Weight:      204 pounds Pulse rate:   71 / minute BP sitting:   143 / 66  (right arm)  Vitals Entered By: Dreama Saa, CNA (November 29, 2009 1:57 PM)  Appended Document: 1 MTH F/U PER CHECKOUT ON 11/01/09/TG

## 2010-11-07 NOTE — Medication Information (Signed)
Summary: ccr-lr  Anticoagulant Therapy  Managed by: Vashti Hey, RN PCP: Kristian Covey, PA-C Supervising MD: Daleen Squibb MD, Maisie Fus Indication 1: Atrial Fibrillation (ICD-427.31) Lab Used: [facility] [place] Site: [place] INR POC 2.2  Dietary changes: no    Health status changes: no    Bleeding/hemorrhagic complications: no    Recent/future hospitalizations: no    Any changes in medication regimen? no    Recent/future dental: no  Any missed doses?: no       Is patient compliant with meds? yes       Allergies: No Known Drug Allergies  Anticoagulation Management History:      The patient is taking warfarin and comes in today for a routine follow up visit.  Negative risk factors for bleeding include an age less than 36 years old.  The bleeding index is 'low risk'.  Negative CHADS2 values include Age > 73 years old.  The start date was 11/01/2006.  Her last INR was 3.42.  Anticoagulation responsible provider: Daleen Squibb MD, Maisie Fus.  INR POC: 2.2.  Cuvette Lot#: [id number].  Exp: 01/2011.    Anticoagulation Management Assessment/Plan:      The patient's current anticoagulation dose is Coumadin 5 mg tabs: 5mg  once daily except 2.5mg  on Tuesdays, Thursdays and Saturdays.  The target INR is 2 - 3.  The next INR is due 10/12/2010.  Anticoagulation instructions were given to patient.  Results were reviewed/authorized by Vashti Hey, RN.  She was notified by Vashti Hey RN.         Prior Anticoagulation Instructions: INR 2.8 Continue coumadin 5mg  once daily except 2.5mg  on Tuesdays, Thursdays and Saturdays  Current Anticoagulation Instructions: INR 2.2 Continue coumadin 5mg  once daily except 2.5mg  on Tuesdays, Thursdays and Saturdays

## 2010-11-07 NOTE — Medication Information (Signed)
Summary: ccr-lr  Anticoagulant Therapy  Managed by: Vashti Hey, RN PCP: Dr. Margit Hanks MD: Dietrich Pates MD, Molly Maduro Indication 1: Atrial Fibrillation (ICD-427.31) Lab Used: [facility] [place] Site: [place] INR POC 3.5  Dietary changes: no    Health status changes: yes       Details: decreased appetite  Bleeding/hemorrhagic complications: no    Recent/future hospitalizations: yes       Details: S/P pacer implant  Any changes in medication regimen? no    Recent/future dental: no  Any missed doses?: no       Is patient compliant with meds? yes       Allergies: No Known Drug Allergies  Anticoagulation Management History:      The patient is taking warfarin and comes in today for a routine follow up visit.  Negative risk factors for bleeding include an age less than 69 years old.  The bleeding index is 'low risk'.  Negative CHADS2 values include Age > 69 years old.  The start date was 11/01/2006.  Her last INR was 3.42.  Anticoagulation responsible provider: Dietrich Pates MD, Molly Maduro.  INR POC: 3.5.  Cuvette Lot#: [id number].  Exp: 01/2011.    Anticoagulation Management Assessment/Plan:      The patient's current anticoagulation dose is Coumadin 5 mg tabs: Take 1 tablet by mouth as directed.  The target INR is 2 - 3.  The next INR is due 12/29/2009.  Anticoagulation instructions were given to patient.  Results were reviewed/authorized by Vashti Hey, RN.  She was notified by Vashti Hey RN.         Prior Anticoagulation Instructions: INR 2.9 Continue coumadin 5mg  once daily  Continue greens  Current Anticoagulation Instructions: INR 3.5 Hold coumadin tonight then decrease dose to 5mg  once daily except 2.5mg  on Mondays

## 2010-11-07 NOTE — Letter (Signed)
Summary: Custom - Delinquent Coumadin 2  [facility]  [address], [place] [address]   Phone: [phone]  Fax: [phone]     October 26, 2009 MRN: [id number]   [facility] [address] [place], [place]  [address]   Dear Ms. Desrochers,  We have attempted to contact you by phone and letter on multiple occasions to contact our office for important blood work associated with the blood thinner, warfarin (Coumadin).  Warfarin is a very important drug that can cause life threatening side effects including, bleeding, and thus requires close laboratory monitoring.  We are unable to accept responsibility for blood thinner-related health problems you may develop because you have not followed our recommendations for appropriate monitoring.  These may include abnormal bleeding occurrences and/or development of blood clots (stroke, heart attack, blood clots in legs or lungs, etc.).  We need for you to contact this office at the number listed above to schedule and complete this very important blood work.  Thank you for your assistance in this urgent matter.  Sincerely, Vashti Hey RN [facility] Team   Please let our office know if you are no longer taking coumadin or if it is being managed by another physican so we can update our records.

## 2010-11-07 NOTE — Medication Information (Signed)
Summary: ccr-lr  Anticoagulant Therapy  Managed by: Vashti Hey, RN PCP: Kristian Covey, PA-C Supervising MD: Dietrich Pates MD, Molly Maduro Indication 1: Atrial Fibrillation (ICD-427.31) Lab Used: [facility] [place] Site: [place] INR POC 2.1  Dietary changes: no    Health status changes: no    Bleeding/hemorrhagic complications: no    Recent/future hospitalizations: yes       Details: had arthroscopic knee surgery on Rt  Any changes in medication regimen? no    Recent/future dental: no  Any missed doses?: yes     Details: Off coumadin x 5 days for procedure    Allergies: No Known Drug Allergies  Anticoagulation Management History:      The patient is taking warfarin and comes in today for a routine follow up visit.  Negative risk factors for bleeding include an age less than 46 years old.  The bleeding index is 'low risk'.  Negative CHADS2 values include Age > 19 years old.  The start date was 11/01/2006.  Her last INR was 3.42.  Anticoagulation responsible provider: Dietrich Pates MD, Molly Maduro.  INR POC: 2.1.  Cuvette Lot#: [id number].  Exp: 01/2011.    Anticoagulation Management Assessment/Plan:      The patient's current anticoagulation dose is Coumadin 5 mg tabs: Take 1 tablet by mouth as directed.  The target INR is 2 - 3.  The next INR is due 07/19/2010.  Anticoagulation instructions were given to patient.  Results were reviewed/authorized by Vashti Hey, RN.  She was notified by Vashti Hey RN.         Prior Anticoagulation Instructions: INR 3.7 Hold coumadin tonight then decrease dose to 5mg  once daily except 2.5mg  on Tuesdays, Thursdays and Saturdays  Current Anticoagulation Instructions: INR 2.1 Continue coumadin 5mg  once daily except 2.5mg  on Tuesday, Thursdays and Saturdays

## 2010-11-07 NOTE — Miscellaneous (Signed)
Summary: labs lipid,liver 09/19/2009  Clinical Lists Changes  Observations: Added new observation of ALBUMIN: 4.3 g/dL (29/56/2130 86:57) Added new observation of PROTEIN, TOT: 6.6 g/dL (84/69/6295 28:41) Added new observation of SGPT (ALT): 19 units/L (09/19/2009 16:18) Added new observation of SGOT (AST): 17 units/L (09/19/2009 16:18) Added new observation of ALK PHOS: 58 units/L (09/19/2009 16:18) Added new observation of BILI DIRECT: 0.1 mg/dL (32/44/0102 72:53) Added new observation of LDL: 46 mg/dL (66/44/0347 42:59) Added new observation of HDL: 41 mg/dL (56/38/7564 33:29) Added new observation of TRIGLYC TOT: 143 mg/dL (51/88/4166 06:30) Added new observation of CHOLESTEROL: 116 mg/dL (16/10/930 35:57)

## 2010-11-07 NOTE — Letter (Signed)
Summary: Appointment - Missed  [facility] [place]  [address], [place] [address]   Phone: [phone]  Fax: [phone]     March 27, 2010 MRN: [id number]   [facility] [place] [address] [place], [place]  [address]   Dear Ms. Fullenwider,  Our records indicate you missed your appointment on       03/27/10 COUMADIN CLINIC            It is very important that we reach you to reschedule this appointment. We look forward to participating in your health care needs. Please contact us at the number listed above at your earliest convenience to reschedule this appointment.     Sincerely,    Glass blower/designer

## 2010-11-07 NOTE — Progress Notes (Signed)
Summary: ASPRIN ORDER  Phone Note From Other Clinic Call back at [phone] X [phone]   Caller: Rodney Cruise Summary of Call: [phone] FAX   NEED ORDER FOR ASPRIN INSTRUCTION PT IS HAVING SURGERY NEXT WEEK AND THEY HAVE GOT THE ORDER ON COUMADIN AND WHEN TO STOP BUT NEED TIO KNOW ABOUT PT ASPRIN. Initial call taken by: Faythe Ghee,  May 24, 2010 2:29 PM  Follow-up for Phone Call        Left detailed message on voice mail that per K. Lawrence, NP, pt. is not to stop taking asa before planned elective right knee arthroscopic surgery.

## 2010-11-07 NOTE — Medication Information (Signed)
Summary: ccr-lr  Anticoagulant Therapy  Managed by: Vashti Hey, RN PCP: Dr. Margit Hanks MD: Dietrich Pates MD, Molly Maduro Indication 1: Atrial Fibrillation (ICD-427.31) Lab Used: [facility] [place] Site: [place] INR POC 3.7  Dietary changes: no    Health status changes: no    Bleeding/hemorrhagic complications: no    Recent/future hospitalizations: no    Any changes in medication regimen? no    Recent/future dental: no  Any missed doses?: no       Is patient compliant with meds? yes       Allergies: No Known Drug Allergies  Anticoagulation Management History:      The patient is taking warfarin and comes in today for a routine follow up visit.  Negative risk factors for bleeding include an age less than 89 years old.  The bleeding index is 'low risk'.  Negative CHADS2 values include Age > 12 years old.  The start date was 11/01/2006.  Her last INR was 3.42.  Anticoagulation responsible provider: Dietrich Pates MD, Molly Maduro.  INR POC: 3.7.  Cuvette Lot#: [id number].  Exp: 01/2011.    Anticoagulation Management Assessment/Plan:      The patient's current anticoagulation dose is Coumadin 5 mg tabs: Take 1 tablet by mouth as directed.  The target INR is 2 - 3.  The next INR is due 03/27/2010.  Anticoagulation instructions were given to patient.  Results were reviewed/authorized by Vashti Hey, RN.  She was notified by Vashti Hey RN.         Prior Anticoagulation Instructions: INR 3.6 Hold coumadin tonight then decrease dose to 5mg  once daily except 2.5mg  on Mondays and Thursdays  Current Anticoagulation Instructions: INR 3.7 Hold coumadin tonight then decrease dose to 5mg  once daily except 2.5mg  on Tuesdays, Thursdays and Saturdays

## 2010-11-07 NOTE — Miscellaneous (Signed)
Summary: Rehab Report/ CARDIAC REHAB PROGRESS REPORT  Rehab Report/ CARDIAC REHAB PROGRESS REPORT   Imported By: Dorise Hiss 11/11/2009 09:38:16  _____________________________________________________________________  External Attachment:    Type:   Image     Comment:   External Document

## 2010-11-07 NOTE — Progress Notes (Signed)
  Phone Note Outgoing Call   Call placed by: Dr. Diona Browner Call placed to: Dr. [name] Summary of Call: I spoke with [name] earlier today regarding her office visit today in [place] with [name].  The patient has been experiencing more palpitations on lowered dose metoprolol, and continues on flecainide and coumadin otherwise.  I am told that cardiac monitoring has picked up more rapid SVT/AF, and no pauses.  The patient had prior 3 second pause (no syncope) that was documented.  [name] planned to have the patient come back to the office tomorrow to see me and formulate a plan going forward.  I was able to speak with Dr. [name] regarding the patient and we discussed the case this afternoon.  Plan for now is to resume prior dose of metoprolol at 50 mg by mouth three times a day and continue other medications.  It was never entirely clear that the prior pause was symptomatic, and she will likely require more beta blocker dose while on flecainide.  She already has a visit scheduled to see Dr. [name] on 2/28 and at that point can discuss further options (such as change in antiarrhythmic versus ablation versus device therapy).  Will hold off return visit tomorrow for now.  Plan was conveyed to [name]. Initial call taken by: Kari Slot, MD, [facility],  November 29, 2009 4:31 PM     Appended Document:  I left message 11/29/2009. Today I spoke with pt, she verbalized understanding to increase metoprolol to three times a day, and continue all other meds, keep appt to see Dr. [name] on Monday 12/05/2009

## 2010-11-07 NOTE — Letter (Signed)
Summary: Appointment - Reminder 2  [facility] [place]. [address], [place] [address]   Phone: [phone]  Fax: [phone]     Feb 21, 2010 MRN: [id number]   [facility] [place] [address] [place], [place]  [address]   Dear [name],  Our records indicate that it is time to schedule a follow-up appointment.  Dr.   Diona Browner       recommended that you follow up with [place] in       12/2009     . It is very important that we reach you to schedule this appointment. We look forward to participating in your health care needs. Please contact us at the number listed above at your earliest convenience to schedule your appointment.  If you are unable to make an appointment at this time, give [place] a call so we can update our records.     Sincerely,   Glass blower/designer

## 2010-11-07 NOTE — Assessment & Plan Note (Signed)
Summary: ROV/SURG CLEARANCE   Visit Type:  Follow-up Referring Provider:  Dr. Norlene Campbell Primary Provider:  Kristian Covey, PA-C   History of Present Illness: 60 year old woman presents for followup and preoperative consultation. She is being considered for elective arthroscopic right knee surgery by Dr. Cleophas Dunker this summer. From a cardiac perspective she reports feeling "good" with no significant palpitations or chest pain. She has been stable status post pacemaker placement by Dr. [name] earlier in the year, as outlined below. Medical regimen was reviewed. She has had no bleeding problems on Coumadin.  Her most recent cardiac studies including echocardiogram and cardiac catheterization from last year were reviewed and outlined below. She had mild nonobstructive CAD at that time and overall normal LVEF.  She reports no new functional limitations, mainly being limited by bilateral knee pain.  Current Medications (verified): 1)  Amlodipine Besylate 5 Mg Tabs (Amlodipine Besylate) .... Take 1 Tablet By Mouth Once A Day 2)  Coumadin 5 Mg Tabs (Warfarin Sodium) .... Take 1 Tablet By Mouth As Directed 3)  Klor-Con M20 20 Meq Cr-Tabs (Potassium Chloride Crys Cr) .... Take 1 Tablet By Mouth Two Times A Day 4)  Lisinopril 20 Mg Tabs (Lisinopril) .... Take 1 Tablet By Mouth Once A Day 5)  Metoprolol Tartrate 50 Mg Tabs (Metoprolol Tartrate) .... Take 1 Tablet By Mouth Three Times A Day 6)  Hydrochlorothiazide 25 Mg Tabs (Hydrochlorothiazide) .... Take One Daily 7)  Nitroglycerin 0.4 Mg Subl (Nitroglycerin) .... Take As Needed 8)  Albuterol Sulfate (5 Mg/ml) 0.5% Nebu (Albuterol Sulfate) .... As Needed 9)  Aspir-Low 81 Mg Tbec (Aspirin) .... Take One Daily 10)  Flecainide Acetate 100 Mg Tabs (Flecainide Acetate) .... Take 1 By Mouth Two Times A Day 11)  Antivert 25 Mg Tabs (Meclizine Hcl) .... Take 1 Tablet By Mouth Three Times A Day As Needed 12)  Crestor 40 Mg Tabs (Rosuvastatin Calcium)  .... Take 1 Tablet By Mouth Once Daily 13)  Albuterol Sulfate (2.5 Mg/26ml) 0.083% Nebu (Albuterol Sulfate) .... Take 1 Treatment Three Times A Day  Allergies (verified): No Known Drug Allergies  Past History:  Social History: Last updated: 11/25/2008 Tobacco Use - Yes.  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 Jude dual-chamber pacemaker, 3/11 (Dr. [name])  Clinical Review Panels:  Echocardiogram Echocardiogram   Study Conclusions    1. Left ventricle: The cavity size was normal. There was mild      concentric hypertrophy. Systolic function was vigorous. The      estimated ejection fraction was in the range of 65% to 70%. Wall      motion was normal; there were no regional wall motion      abnormalities.   2. Aortic valve: Mild regurgitation.   3. Left atrium: The atrium was mildly dilated.   4. Right ventricle: The cavity size was normal. Wall thickness was      increased.   Impressions:    - Compared to the prior study of 11/19/06: no segmental wall motion     abnormality appreciated on the current study.   Echocardiography. M-mode, complete 2D, spectral Doppler, and color   Doppler. Patient status: Inpatient. Location: Bedside.   [place] Bing, MD   [id number] (03/14/2009)  Cardiac Imaging Cardiac Cath Findings   CONCLUSION:   1. Mild nonobstructive coronary artery disease with no significant  obstruction in the LAD, 20% narrowing in the proximal circumflex       artery, 20% to 30% narrowing in the proximal right coronary artery       and anterolateral wall hypokinesis with an estimated ejection       fraction of 60%.   2. IVUS study as part of the statin trial demonstrating mild plaque in       the proximal right coronary artery.  (05/31/2009)    Review of Systems       The patient complains of peripheral edema.  The patient denies anorexia, fever, chest pain, syncope, dyspnea on exertion, prolonged cough, headaches, hemoptysis, melena, and hematochezia.         Otherwise reviewed and negative except as outlined.  Vital Signs:  Patient profile:   60 year old female Weight:      200 pounds Pulse rate:   82 / minute BP sitting:   137 / 74  (right arm)  Vitals Entered By: Kari Saa, CNA (April 13, 2010 2:05 PM)  Physical Exam  Additional Exam:  Obese woman in no acute distress. HEENT: Conjunctivae and lids normal, oropharynx clear. Neck: Supple, no elevated jugular venous pressure or loud bruits. Lungs: Clear to auscultation, nonlabored. Cardiac: Indistinct PMI, regular rate and rhythm, no S3 gallop. Abdomen: Obese, unable to palpate liver edge, bowel sounds present, nontender. Extremities: Trace peripheral edema, distal pulses one plus. Skin: Warm and dry. Musculoskeletal: No kyphosis noted. Neuropsychiatric: Alert and oriented x3, affect appropriate.   EKG  Procedure date:  04/13/2010  Findings:      Sinus rhythm at 67 beats per minute, decreased anterior R-wave progression, nonspecific T wave changes.  PPM Specifications Following MD:  Lewayne Bunting, MD     PPM Vendor:  St Jude     PPM Serial Number:  [id number] PPM DOI:  12/09/2009     PPM Implanting MD:  Lewayne Bunting, MD  Lead 1    Location: RA     DOI: 12/09/2009     Model #: [id number]     Serial #: [id number]     Status: active Lead 2    Location: RV     DOI: 12/09/2009     Model #: [id number]     Serial #: [id number]     Status: active  Magnet Response Rate:  BOL 100 ERI 85  Indications:  Tachy-brady syndrome   PPM Follow Up Pacer Dependent:  No      Episodes Coumadin:  Yes  Parameters Mode:  DDDR     Lower Rate Limit:  60     Upper Rate Limit:  120 Paced AV Delay:  200     Sensed AV Delay:  200  Impression & Recommendations:  Problem  # 1:  PREOPERATIVE EXAMINATION (ICD-V72.84)  From a cardiac perspective [name] has been stable, with no anginal symptoms or significant palpitations. She has been stable status post placement of a St. Jude pacemaker by Dr. [name] back in March related to tachycardia-bradycardia syndrome with paroxysmal atrial fibrillation. She should be able to temporarily discontinue Coumadin for elective right knee arthroscopic surgery as requested. Would otherwise plan to continue her regular medications and would expect that she should have a relatively low perioperative cardiovascular risk. We can see her for perioperative consultation if needed, otherwise I will anticipate an office visit in the next 6 months.  Problem # 2:  ATRIAL FIBRILLATION (ICD-427.31)  Paroxysmal atrial fibrillation, well-controlled symptomatically on present regimen which includes  Flecainide and Coumadin.  Her updated medication list for this problem includes:    Coumadin 5 Mg Tabs (Warfarin sodium) .[name]... Take 1 tablet by mouth as directed    Metoprolol Tartrate 50 Mg Tabs (Metoprolol tartrate) .[name]... Take 1 tablet by mouth three times a day    Aspir-low 81 Mg Tbec (Aspirin) .[name]... Take one daily    Flecainide Acetate 100 Mg Tabs (Flecainide acetate) .[name]... Take 1 by mouth two times a day  Problem # 3:  BRADYCARDIA-TACHYCARDIA SYNDROME (ICD-427.81)  Stable status post placement of St. Jude pacemaker by Dr. [name] back in March of this year.  Her updated medication list for this problem includes:    Amlodipine Besylate 5 Mg Tabs (Amlodipine besylate) .[name]... Take 1 tablet by mouth once a day    Coumadin 5 Mg Tabs (Warfarin sodium) .[name]... Take 1 tablet by mouth as directed    Lisinopril 20 Mg Tabs (Lisinopril) .[name]... Take 1 tablet by mouth once a day    Metoprolol Tartrate 50 Mg Tabs (Metoprolol tartrate) .[name]... Take 1 tablet by mouth three times a day    Nitroglycerin 0.4 Mg Subl (Nitroglycerin) .[name]... Take as needed    Aspir-low 81 Mg  Tbec (Aspirin) .[name]... Take one daily    Flecainide Acetate 100 Mg Tabs (Flecainide acetate) .[name]... Take 1 by mouth two times a day  Problem # 4:  CORONARY ATHEROSCLEROSIS NATIVE CORONARY ARTERY (ICD-414.01)  Asymptomatic with documentation of mild, nonobstructive CAD at catheterization last year.  Her updated medication list for this problem includes:    Amlodipine Besylate 5 Mg Tabs (Amlodipine besylate) .[name]... Take 1 tablet by mouth once a day    Coumadin 5 Mg Tabs (Warfarin sodium) .[name]... Take 1 tablet by mouth as directed    Lisinopril 20 Mg Tabs (Lisinopril) .[name]... Take 1 tablet by mouth once a day    Metoprolol Tartrate 50 Mg Tabs (Metoprolol tartrate) .[name]... Take 1 tablet by mouth three times a day    Nitroglycerin 0.4 Mg Subl (Nitroglycerin) .[name]... Take as needed    Aspir-low 81 Mg Tbec (Aspirin) .[name]... Take one daily  Patient Instructions: 1)  Your physician recommends that you schedule a follow-up appointment in: 6 months 2)  Your physician recommends that you continue on your current medications as directed. Please refer to the Current Medication list given to you today.  Appended Document: ROV/SURG CLEARANCE Pre-operative form and all cardiac testing results faxed to [facility] attn: Dr. Cleophas Dunker at [phone].

## 2010-11-07 NOTE — Letter (Signed)
Summary: SURGICAL CLEARANCE  SURGICAL CLEARANCE   Imported By: Faythe Ghee 04/13/2010 16:15:20  _____________________________________________________________________  External Attachment:    Type:   Image     Comment:   External Document

## 2010-11-07 NOTE — Letter (Signed)
Summary: Implantable Device Instructions  [facility] [place]  [address], [place] [address]   Phone: [phone]  Fax: [phone]      Implantable Device Instructions  You are scheduled for:  ___X__ Permanent Transvenous Pacemaker _____ Implantable Cardioverter Defibrillator _____ Implantable Loop Recorder _____ Generator Change  on _03-04-2011___ with Dr. _Taylor____.  1.  Please arrive at the Short Stay Center at [facility] at __1:30___ on the day of your procedure.  2.  Do not eat or drink the night before your procedure.  3.  Do NOT take these medications for ____ days prior to your procedure:  _________________________.  Take your last dose of Coumadin on ________.  4.  Plan for an overnight stay.  Bring your insurance cards and a list of your medications.  5.  Wash your chest and neck with antibacterial soap (any brand) the evening before and the morning of your procedure.  Rinse well.  6.  Education material received:     Pacemaker __X___           ICD _____           Arrhythmia _____  *If you have ANY questions after you get home, please call the office [phone].  *Every attempt is made to prevent procedures from being rescheduled.  Due to the nauture of Electrophysiology, rescheduling can happen.  The physician is always aware and directs the staff when this occurs.

## 2010-11-07 NOTE — Letter (Signed)
Summary: Custom - Delinquent Coumadin 1  [facility]  [address] [place] [address]   Phone: [phone]  Fax: [phone]     April 06, 2010 MRN: [id number]   [facility] [place] [address] [place], [place]  [address]   Dear [name],  This letter is being sent to you as a reminder that it is necessary for you to get your INR/PT checked regularly so that we can optimize your care.  Our records indicate that you were scheduled to have a test done recently.  As of today, we have not received the results of this test.  It is very important that you have your INR checked.  Please call our office at the number listed above to schedule an appointment at your earliest convenience.    If you have recently had your protime checked or have discontinued this medication, please contact our office at the above phone number to clarify this issue.  Thank you for this prompt attention to this important health care matter.  Sincerely, Vashti Hey RN  [facility] Cardiovascular Risk Reduction [facility] Team

## 2010-11-07 NOTE — Medication Information (Signed)
Summary: protime/tg  Anticoagulant Therapy  Managed by: Vashti Hey, RN PCP: Dr. Margit Hanks MD: Eden Emms MD, Theron Arista Indication 1: Atrial Fibrillation (ICD-427.31) Lab Used: [facility] [place] Site: [place] INR POC 2.5  Dietary changes: no    Health status changes: no    Bleeding/hemorrhagic complications: no    Recent/future hospitalizations: no    Any changes in medication regimen? no    Recent/future dental: no  Any missed doses?: no       Is patient compliant with meds? yes       Allergies: No Known Drug Allergies  Anticoagulation Management History:      The patient is taking warfarin and comes in today for a routine follow up visit.  Negative risk factors for bleeding include an age less than 18 years old.  The bleeding index is 'low risk'.  Negative CHADS2 values include Age > 61 years old.  The start date was 11/01/2006.  Her last INR was 3.42.  Anticoagulation responsible provider: Eden Emms MD, Theron Arista.  INR POC: 2.5.  Cuvette Lot#: [id number].  Exp: 01/2011.    Anticoagulation Management Assessment/Plan:      The patient's current anticoagulation dose is Coumadin 5 mg tabs: Take 1 tablet by mouth as directed.  The target INR is 2 - 3.  The next INR is due 02/20/2010.  Anticoagulation instructions were given to patient.  Results were reviewed/authorized by Vashti Hey, RN.  She was notified by Vashti Hey RN.         Prior Anticoagulation Instructions: INR 2.5 Continue coumadin 5mg  once daily except 2.5mg  on Mondays  Current Anticoagulation Instructions: Same as Prior Instructions.

## 2010-11-07 NOTE — Medication Information (Signed)
Summary: CCR ADD ON SAME DAY/TMJ  Anticoagulant Therapy  Managed by: Teressa Lower, RN PCP: Dr. Aleen Campi Supervising MD: Dietrich Pates MD, Molly Maduro Indication 1: Atrial Fibrillation (ICD-427.31) Lab Used: [facility] [place] Site:  INR POC 3.6  Dietary changes: no    Health status changes: no    Bleeding/hemorrhagic complications: no    Recent/future hospitalizations: no    Any changes in medication regimen? no    Recent/future dental: no  Any missed doses?: no       Is patient compliant with meds? yes       Current Medications (verified): 1)  Amlodipine Besylate 5 Mg Tabs (Amlodipine Besylate) .... Take 1 Tablet By Mouth Once A Day 2)  Coumadin 5 Mg Tabs (Warfarin Sodium) .... Take 1 Tablet By Mouth As Directed 3)  Klor-Con M20 20 Meq Cr-Tabs (Potassium Chloride Crys Cr) .... Take 1 Tablet By Mouth Once Daily 4)  Lisinopril 20 Mg Tabs (Lisinopril) .... Take 1 Tablet By Mouth Once A Day 5)  Metoprolol Tartrate 50 Mg Tabs (Metoprolol Tartrate) .... Take 1 Tablet By Mouth Three Times A Day 6)  Hydrochlorothiazide 25 Mg Tabs (Hydrochlorothiazide) .... Take One Daily 7)  Nitroglycerin 0.4 Mg Subl (Nitroglycerin) .... Take As Needed 8)  Albuterol Sulfate (5 Mg/ml) 0.5% Nebu (Albuterol Sulfate) .... As Needed 9)  Aspir-Low 81 Mg Tbec (Aspirin) .... Take One Daily 10)  Flecainide Acetate 100 Mg Tabs (Flecainide Acetate) .... Take 1 By Mouth Two Times A Day 11)  Antivert 25 Mg Tabs (Meclizine Hcl) .... Take 1 Tablet By Mouth Three Times A Day As Needed 12)  Crestor 40 Mg Tabs (Rosuvastatin Calcium) .... Take 1 Tablet By Mouth Once Daily 13)  Coumadin 5 Mg Tabs (Warfarin Sodium) .... Sunday - 1 Tab, Monday - 1 Tab, Tuesday - 1 Tab, Wednesday - 1 Tab, Thursday - 1 Tab, Friday - 1 Tab, Saturday - 1 Tab  Allergies (verified): No Known Drug Allergies  Anticoagulation Management History:      The patient is taking warfarin and comes in today for a  routine follow up visit.  Negative risk factors for bleeding include an age less than 63 years old.  The bleeding index is 'low risk'.  Negative CHADS2 values include Age > 70 years old.  The start date was 11/01/2006.  Her last INR was 3.1 ratio.  Anticoagulation responsible provider: Dietrich Pates MD, Molly Maduro.  INR POC: 3.6.  Cuvette Lot#: [id number].  Exp: 01/2011.    Anticoagulation Management Assessment/Plan:      The patient's current anticoagulation dose is Coumadin 5 mg tabs: Take 1 tablet by mouth as directed, Coumadin 5 mg tabs: Sunday - 1 tab, Monday - 1 tab, Tuesday - 1 tab, Wednesday - 1 tab, Thursday - 1 tab, Friday - 1 tab, Saturday - 1 tab.  The target INR is 2 - 3.  The next INR is due 11/14/2009.  Anticoagulation instructions were given to patient.  Results were reviewed/authorized by Teressa Lower, RN.  She was notified by Teressa Lower RN.         Prior Anticoagulation Instructions: INR 3.3 today Take coumadin 1/2 tablet tonight then resume 1 tablet once daily   Current Anticoagulation Instructions: INR 3.6 TODAY HOLD TODAYS DOSE THEN RESUME 5MG  DAILY

## 2010-11-07 NOTE — Medication Information (Signed)
Summary: ccr-lr  Anticoagulant Therapy  Managed by: Vashti Hey, RN PCP: Kristian Covey, PA-C Supervising MD: Diona Browner MD, Remi Deter Indication 1: Atrial Fibrillation (ICD-427.31) Lab Used: [facility] [place] Site: [place] INR POC 2.8  Dietary changes: no    Health status changes: no    Bleeding/hemorrhagic complications: no    Recent/future hospitalizations: no    Any changes in medication regimen? no    Recent/future dental: no  Any missed doses?: no       Is patient compliant with meds? yes       Allergies: No Known Drug Allergies  Anticoagulation Management History:      The patient is taking warfarin and comes in today for a routine follow up visit.  Negative risk factors for bleeding include an age less than 88 years old.  The bleeding index is 'low risk'.  Negative CHADS2 values include Age > 65 years old.  The start date was 11/01/2006.  Her last INR was 3.42.  Anticoagulation responsible provider: Diona Browner MD, Remi Deter.  INR POC: 2.8.  Cuvette Lot#: [id number].  Exp: 01/2011.    Anticoagulation Management Assessment/Plan:      The patient's current anticoagulation dose is Coumadin 5 mg tabs: 5mg  once daily except 2.5mg  on Tuesdays, Thursdays and Saturdays.  The target INR is 2 - 3.  The next INR is due 09/14/2010.  Anticoagulation instructions were given to patient.  Results were reviewed/authorized by Vashti Hey, RN.  She was notified by Vashti Hey RN.         Prior Anticoagulation Instructions: INR 2.4 Continue coumadin 5mg  once daily except 2.5mg  on Tuesdays, Thursdays and Saturdays  Current Anticoagulation Instructions: INR 2.8 Continue coumadin 5mg  once daily except 2.5mg  on Tuesdays, Thursdays and Saturdays

## 2010-11-07 NOTE — Letter (Signed)
Summary: Custom - Delinquent Coumadin 1  [facility]  [address], [place] [address]   Phone: [phone]  Fax: [phone]     May 24, 2010 MRN: [id number]   [facility] [place] [address] [place], [place]  [address]   Dear [name],  This letter is being sent to you as a reminder that it is necessary for you to get your INR/PT checked regularly so that we can optimize your care.  Our records indicate that you were scheduled to have a test done recently.  As of today, we have not received the results of this test.  It is very important that you have your INR checked.  Please call our office at the number listed above to schedule an appointment at your earliest convenience.    If you have recently had your protime checked or have discontinued this medication, please contact our office at the above phone number to clarify this issue.  Thank you for this prompt attention to this important health care matter.  Sincerely, Kari Hey, RN  [facility] Cardiovascular Risk Reduction Clinic Team

## 2010-11-07 NOTE — Procedures (Signed)
Summary: WOUND CHECK   Current Medications (verified): 1)  Amlodipine Besylate 5 Mg Tabs (Amlodipine Besylate) .... Take 1 Tablet By Mouth Once A Day 2)  Coumadin 5 Mg Tabs (Warfarin Sodium) .... Take 1 Tablet By Mouth As Directed 3)  Klor-Con M20 20 Meq Cr-Tabs (Potassium Chloride Crys Cr) .... Take 1 Tablet By Mouth Two Times A Day 4)  Lisinopril 20 Mg Tabs (Lisinopril) .... Take 1 Tablet By Mouth Once A Day 5)  Metoprolol Tartrate 50 Mg Tabs (Metoprolol Tartrate) .... Take 1 Tablet By Mouth Three Times A Day 6)  Hydrochlorothiazide 25 Mg Tabs (Hydrochlorothiazide) .... Take One Daily 7)  Nitroglycerin 0.4 Mg Subl (Nitroglycerin) .... Take As Needed 8)  Albuterol Sulfate (5 Mg/ml) 0.5% Nebu (Albuterol Sulfate) .... As Needed 9)  Aspir-Low 81 Mg Tbec (Aspirin) .... Take One Daily 10)  Flecainide Acetate 100 Mg Tabs (Flecainide Acetate) .... Take 1 By Mouth Two Times A Day 11)  Antivert 25 Mg Tabs (Meclizine Hcl) .... Take 1 Tablet By Mouth Three Times A Day As Needed 12)  Crestor 40 Mg Tabs (Rosuvastatin Calcium) .... Take 1 Tablet By Mouth Once Daily 13)  Albuterol Sulfate (2.5 Mg/30ml) 0.083% Nebu (Albuterol Sulfate) .... Take 1 Treatment Three Times A Day  Allergies (verified): No Known Drug Allergies  PPM Specifications Following MD:  Lewayne Bunting, MD     PPM Vendor:  St Jude     PPM Serial Number:  [id number] PPM DOI:  12/09/2009     PPM Implanting MD:  Lewayne Bunting, MD  Lead 1    Location: RA     DOI: 12/09/2009     Model #: [id number]     Serial #: [id number]     Status: active Lead 2    Location: RV     DOI: 12/09/2009     Model #: [id number]     Serial #: [id number]     Status: active  Magnet Response Rate:  BOL 100 ERI 85  Indications:  Tachy-brady syndrome   PPM Follow Up Remote Check?  No Battery Voltage:  3.02 V     Battery Est. Longevity:  7.2 years     Pacer Dependent:  No       PPM Device Measurements Atrium  Amplitude: 5 mV, Impedance: 440 ohms, Threshold: 0.5 V at 0.5  msec Right Ventricle  Amplitude: 12 mV, Impedance: 530 ohms, Threshold: 0.625 V at 0.5 msec  Episodes MS Episodes:  0     Percent Mode Switch:  0     Coumadin:  Yes Atrial Pacing:  59%     Ventricular Pacing:  <1%  Parameters Mode:  DDD     Lower Rate Limit:  60     Upper Rate Limit:  120 Paced AV Delay:  200     Sensed AV Delay:  200 Next Cardiology Appt Due:  03/08/2010 Tech Comments:  Steri strips removed, no redness, minimal edema although the patient c/o some, along with soreness in the am if she sleeps on her left side.  Ventricular autocapture programmed on. Rate response somewhat blunted but adequate for her level of activity.  I have encouraged her to increase her activity and she is to start cardiac rehab.  ROV 3 months with Dr [name] in [place]. Altha Harm, LPN  December 29, 2009 10:28 AM  MD Comments:  Agree with above.

## 2010-11-07 NOTE — Cardiovascular Report (Signed)
Summary: Office Visit   Office Visit   Imported By: Roderic Ovens 01/13/2010 13:50:56  _____________________________________________________________________  External Attachment:    Type:   Image     Comment:   External Document

## 2010-11-07 NOTE — Assessment & Plan Note (Signed)
Summary: rov sob and nausia   Visit Type:  Follow-up   Preventive Screening-Counseling & Management  Alcohol-Tobacco     Alcohol drinks/day: 0     Smoking Status: current     Smoking Cessation Counseling: yes     Smoke Cessation Stage: precontemplative     Packs/Day: 0.5  Problems Prior to Update: 1)  Palpitations  (ICD-785.1) 2)  Hyperlipidemia  (ICD-272.4) 3)  Tobacco Abuse  (ICD-305.1) 4)  Overweight/obesity  (ICD-278.02) 5)  Dizziness  (ICD-780.4) 6)  Unspecified Chronic Ischemic Heart Disease  (ICD-414.9) 7)  Coronary Atherosclerosis Native Coronary Artery  (ICD-414.01) 8)  Atrial Fibrillation  (ICD-427.31)  Current Problems (verified): 1)  Palpitations  (ICD-785.1) 2)  Hyperlipidemia  (ICD-272.4) 3)  Tobacco Abuse  (ICD-305.1) 4)  Overweight/obesity  (ICD-278.02) 5)  Dizziness  (ICD-780.4) 6)  Unspecified Chronic Ischemic Heart Disease  (ICD-414.9) 7)  Coronary Atherosclerosis Native Coronary Artery  (ICD-414.01) 8)  Atrial Fibrillation  (ICD-427.31)  Current Medications (verified): 1)  Amlodipine Besylate 5 Mg Tabs (Amlodipine Besylate) .... Take 1 Tablet By Mouth Once A Day 2)  Coumadin 5 Mg Tabs (Warfarin Sodium) .... Take 1 Tablet By Mouth As Directed 3)  Klor-Con M20 20 Meq Cr-Tabs (Potassium Chloride Crys Cr) .... Take 1 Tablet By Mouth Once Daily 4)  Lisinopril 20 Mg Tabs (Lisinopril) .... Take 1 Tablet By Mouth Once A Day 5)  Metoprolol Tartrate 50 Mg Tabs (Metoprolol Tartrate) .... Take 1 Tablet By Mouth Three Times A Day 6)  Hydrochlorothiazide 25 Mg Tabs (Hydrochlorothiazide) .... Take One Daily 7)  Nitroglycerin 0.4 Mg Subl (Nitroglycerin) .... Take As Needed 8)  Albuterol Sulfate (5 Mg/ml) 0.5% Nebu (Albuterol Sulfate) .... As Needed 9)  Aspir-Low 81 Mg Tbec (Aspirin) .... Take One Daily 10)  Flecainide Acetate 100 Mg Tabs (Flecainide Acetate) .... Take 1 By Mouth Two Times A Day 11)  Antivert 25 Mg Tabs (Meclizine Hcl) .... Take 1 Tablet By Mouth  Three Times A Day As Needed 12)  Crestor 40 Mg Tabs (Rosuvastatin Calcium) .... Take 1 Tablet By Mouth Once Daily 13)  Albuterol Sulfate (2.5 Mg/62ml) 0.083% Nebu (Albuterol Sulfate) .... Take 1 Treatment Three Times A Day  Allergies (verified): No Known Drug Allergies  Past History:  Past medical, surgical, family and social histories (including risk factors) reviewed, and no changes noted (except as noted below).  Past Medical History: Reviewed history from 11/25/2008 and no changes required. Atrial Fibrillation CAD C O P D Hyperlipidemia Hypertension Myocardial Infarction  Past Surgical History: Reviewed history from 11/25/2008 and no changes required. Appendectomy TAH  Family History: Reviewed history from 11/25/2008 and no changes required. Family History of Coronary Artery Disease  Social History: Reviewed history from 11/25/2008 and no changes required. Tobacco Use - Yes.  Alcohol Use - no Packs/Day:  0.5 Alcohol drinks/day:  0  Review of Systems       palpatations, nausea, weight gain. All other systems have been reviewed and are negative unless stated above.   Vital Signs:  Patient profile:   60 year old female Weight:      203 pounds BMI:     37.26 Pulse rate:   70 / minute BP sitting:   129 / 74  (right arm)  Vitals Entered By: Dreama Saa, CNA (November 01, 2009 2:49 PM)  Physical Exam  General:  Well developed, well nourished, in no acute distress. Lungs:  Mild expiratory wheezes with prolonged expiratory phase.  Heart:  Non-displaced PMI, chest non-tender; regular rate  and rhythm, S1, S2 without murmurs, rubs or gallops. Carotid upstroke normal, no bruit. Normal abdominal aortic size, no bruits. Femorals normal pulses, no bruits. Pedals normal pulses. No edema, no varicosities. Abdomen:  Obese, NT 2+ BS Msk:  Back normal, normal gait. Muscle strength and tone normal. Extremities:  trace left pedal edema and trace right pedal edema.     Neurologic:  Alert and oriented x 3. Psych:  Normal affect.   EKG  Procedure date:  11/01/2009  Findings:      Normal sinus rhythm with rate of:  64bpm  Impression & Recommendations:  Problem # 1:  CHRONIC OBSTRUCTIVE ASTHMA UNSPECIFIED (ICD-493.20) Mrs. Wynns is newly placed on albuterol inhalers which is using two times a day to three times a day.  She states they have helped her breathing status.  But she fears that they have also caused her to gain weight. I reassured her that the steroids would play a part in weight gain. I have advised her to stop smoking. Her updated medication list for this problem includes:    Albuterol Sulfate (5 Mg/ml) 0.5% Nebu (Albuterol sulfate) .[name]... As needed    Albuterol Sulfate (2.5 Mg/8ml) 0.083% Nebu (Albuterol sulfate) .[name]... Take 1 treatment three times a day  Problem # 2:  PALPITATIONS (ICD-785.1) I believe this is multifactorial  She is on inhaled steriods which may be adding to irriatibility of heart rhythm. We will place a cardionet monitor on her for evaluation of frequency of palpations and morphology.  Smoking, using nicotine may also contribute.   EKG does not show any arrythmia at this time.  No changes in her medications until we evaluate the rhythum.   The following medications were removed from the medication list:    Coumadin 5 Mg Tabs (Warfarin sodium) ..... Sunday - 1 tab, monday - 1 tab, tuesday - 1 tab, wednesday - 1 tab, thursday - 1 tab, friday - 1 tab, saturday - 1 tab Her updated medication list for this problem includes:    Amlodipine Besylate 5 Mg Tabs (Amlodipine besylate) .[name]... Take 1 tablet by mouth once a day    Coumadin 5 Mg Tabs (Warfarin sodium) .[name]... Take 1 tablet by mouth as directed    Lisinopril 20 Mg Tabs (Lisinopril) .[name]... Take 1 tablet by mouth once a day    Metoprolol Tartrate 50 Mg Tabs (Metoprolol tartrate) .[name]... Take 1 tablet by mouth three times a day    Nitroglycerin 0.4 Mg Subl (Nitroglycerin) .[name]... Take as  needed    Aspir-low 81 Mg Tbec (Aspirin) .[name]... Take one daily    Flecainide Acetate 100 Mg Tabs (Flecainide acetate) .[name]... Take 1 by mouth two times a day  Orders: Cardionet/Event Monitor (Cardionet/Event)  Problem # 3:  ATRIAL FIBRILLATION (ICD-427.31) Pt INR was checked in the office today with elevated INR of 3.6.  Coumadin has been adjusted.  Hemoccult cards provided. The following medications were removed from the medication list:    Coumadin 5 Mg Tabs (Warfarin sodium) ..... Sunday - 1 tab, monday - 1 tab, tuesday - 1 tab, wednesday - 1 tab, thursday - 1 tab, friday - 1 tab, saturday - 1 tab Her updated medication list for this problem includes:    Coumadin 5 Mg Tabs (Warfarin sodium) .[name]... Take 1 tablet by mouth as directed    Metoprolol Tartrate 50 Mg Tabs (Metoprolol tartrate) .[name]... Take 1 tablet by mouth three times a day    Aspir-low 81 Mg Tbec (Aspirin) .[name]... Take one daily  Flecainide Acetate 100 Mg Tabs (Flecainide acetate) .[name]... Take 1 by mouth two times a day  Orders: Cardionet/Event Monitor (Cardionet/Event)  Patient Instructions: 1)  Your physician recommends that you schedule a follow-up appointment in: 1 month 2)  Your physician recommends that you continue on your current medications as directed. Please refer to the Current Medication list given to you today. 3)  Your physician has recommended that you wear an event monitor.  Event monitors are medical devices that record the heart's electrical activity. Doctors most often use these monitors to diagnose arrhythmias. Arrhythmias are problems with the speed or rhythm of the heartbeat. The monitor is a small, portable device. You can wear one while you do your normal daily activities. This is usually used to diagnose what is causing palpitations/syncope (passing out). 4)  Your physician discussed the hazards of tobacco use.  Tobacco use cessation is recommended and techniques and options to help you quit were discussed.

## 2010-11-07 NOTE — Procedures (Signed)
Summary: Holter and Event  Holter and Event   Imported By: Faythe Ghee 12/01/2009 09:39:13  _____________________________________________________________________  External Attachment:    Type:   Image     Comment:   External Document

## 2010-11-07 NOTE — Miscellaneous (Signed)
**Note De-Identified  Obfuscation** Summary: Orders Update: hemoccult  Clinical Lists Changes  Problems: Added new problem of COUMADIN THERAPY (ICD-V58.61) Orders: Added new Test order of Hemoccult Cards (Take Home) (Hemoccult Cards) - Signed

## 2010-11-07 NOTE — Medication Information (Signed)
Summary: ccr-lr  Anticoagulant Therapy  Managed by: Vashti Hey, RN PCP: Kristian Covey, PA-C Supervising MD: Dietrich Pates MD, Molly Maduro Indication 1: Atrial Fibrillation (ICD-427.31) Lab Used: [facility] Site:  INR POC 2.4  Dietary changes: no    Health status changes: no    Bleeding/hemorrhagic complications: no    Recent/future hospitalizations: no    Any changes in medication regimen? no    Recent/future dental: no  Any missed doses?: no       Is patient compliant with meds? yes       Allergies: No Known Drug Allergies  Anticoagulation Management History:      The patient is taking warfarin and comes in today for a routine follow up visit.  Negative risk factors for bleeding include an age less than 50 years old.  The bleeding index is 'low risk'.  Negative CHADS2 values include Age > 58 years old.  The start date was 11/01/2006.  Her last INR was 3.42.  Anticoagulation responsible [place]: Dietrich Pates MD, Molly Maduro.  INR POC: 2.4.  Cuvette Lot#: [id number].  Exp: 01/2011.    Anticoagulation Management Assessment/Plan:      The patient's current anticoagulation dose is Coumadin 5 mg tabs: Take 1 tablet by mouth as directed.  The target INR is 2 - 3.  The next INR is due 08/17/2010.  Anticoagulation instructions were given to patient.  Results were reviewed/authorized by Vashti Hey, RN.  She was notified by Vashti Hey RN.         Prior Anticoagulation Instructions: INR 2.1 Continue coumadin 5mg  once daily except 2.5mg  on Tuesday, Thursdays and Saturdays  Current Anticoagulation Instructions: INR 2.4 Continue coumadin 5mg  once daily except 2.5mg  on Tuesdays, Thursdays and Saturdays

## 2010-11-09 NOTE — Medication Information (Signed)
Summary: ccr-lr  Anticoagulant Therapy  Managed by: Vashti Hey, RN PCP: Kristian Covey, PA-C Supervising MD: Dietrich Pates MD, Molly Maduro Indication 1: Atrial Fibrillation (ICD-427.31) Lab Used: [facility] [place] Site: [place] INR POC 2.3  Dietary changes: no    Health status changes: no    Bleeding/hemorrhagic complications: no    Recent/future hospitalizations: no    Any changes in medication regimen? no    Recent/future dental: no  Any missed doses?: no       Is patient compliant with meds? yes       Allergies: No Known Drug Allergies  Anticoagulation Management History:      The patient is taking warfarin and comes in today for a routine follow up visit.  Negative risk factors for bleeding include an age less than 24 years old.  The bleeding index is 'low risk'.  Negative CHADS2 values include Age > 50 years old.  The start date was 11/01/2006.  Her last INR was 3.42.  Anticoagulation responsible provider: Dietrich Pates MD, Molly Maduro.  INR POC: 2.3.  Cuvette Lot#: [id number].  Exp: 01/2011.    Anticoagulation Management Assessment/Plan:      The patient's current anticoagulation dose is Coumadin 5 mg tabs: 5mg  once daily except 2.5mg  on Tuesdays, Thursdays and Saturdays.  The target INR is 2 - 3.  The next INR is due 11/13/2010.  Anticoagulation instructions were given to patient.  Results were reviewed/authorized by Vashti Hey, RN.  She was notified by Vashti Hey RN.         Prior Anticoagulation Instructions: INR 2.2 Continue coumadin 5mg  once daily except 2.5mg  on Tuesdays, Thursdays and Saturdays  Current Anticoagulation Instructions: INR 2.3 Continue coumadin 5mg  once daily except 2.5mg  on Tuesdays, Thursdays and Saturdays

## 2010-11-16 ENCOUNTER — Encounter (INDEPENDENT_AMBULATORY_CARE_PROVIDER_SITE_OTHER): Payer: Self-pay | Admitting: *Deleted

## 2010-11-23 NOTE — Letter (Signed)
Summary: Appointment - Missed  [facility] [place]  [address] [place] [address]   Phone: [phone]  Fax: [phone]     November 16, 2010 MRN: [id number]   [name] [address] [place], [place]  [address]   Dear Ms. Dunshee,  Our records indicate you missed your appointment on  11/13/10                      with Coumadin clinic      .                                    It is very important that we reach you to reschedule this appointment. We look forward to participating in your health care needs. Please contact us at the number listed above at your earliest convenience to reschedule this appointment.     Sincerely,    Glass blower/designer

## 2010-12-04 ENCOUNTER — Encounter: Payer: Self-pay | Admitting: Cardiology

## 2010-12-04 ENCOUNTER — Ambulatory Visit (INDEPENDENT_AMBULATORY_CARE_PROVIDER_SITE_OTHER): Payer: Medicaid Other | Admitting: Cardiology

## 2010-12-04 ENCOUNTER — Ambulatory Visit (HOSPITAL_COMMUNITY): Payer: Medicaid Other

## 2010-12-04 DIAGNOSIS — I495 Sick sinus syndrome: Secondary | ICD-10-CM

## 2010-12-04 DIAGNOSIS — I10 Essential (primary) hypertension: Secondary | ICD-10-CM

## 2010-12-04 DIAGNOSIS — I4891 Unspecified atrial fibrillation: Secondary | ICD-10-CM

## 2010-12-05 ENCOUNTER — Encounter: Payer: Self-pay | Admitting: Cardiology

## 2010-12-11 ENCOUNTER — Telehealth (INDEPENDENT_AMBULATORY_CARE_PROVIDER_SITE_OTHER): Payer: Self-pay

## 2010-12-14 NOTE — Assessment & Plan Note (Signed)
Summary: [name]   Visit Type:  Follow-up Primary Provider:  Kristian Covey, PA-C   History of Present Illness: 60 year old woman presents for followup. She missed her last scheduled visit. She was last seen in July 2011, for preoperative assessment prior to elective arthroscopic right knee surgery by Dr. Cleophas Dunker. She tells me that she underwent this procedure without cardiac difficulties, apparently has significant knee arthritis, being managed conservatively.  She reports no problems with significant palpitations. Continues to tolerate Coumadin. She has not had a device followup since last June.  She reports stable leg discoloration related to significant venous insufficiency, varicose veins. No claudication symptoms.  Current Medications (verified): 1)  Amlodipine Besylate 5 Mg Tabs (Amlodipine Besylate) .... Take 1 Tablet By Mouth Once A Day 2)  Coumadin 5 Mg Tabs (Warfarin Sodium) .... 5mg  Once Daily Except 2.5mg  On Tuesdays, Thursdays and Saturdays 3)  Klor-Con M20 20 Meq Cr-Tabs (Potassium Chloride Crys Cr) .... Take 1 Tablet Daily 4)  Lisinopril 20 Mg Tabs (Lisinopril) .... Take 1 Tablet By Mouth Once A Day 5)  Metoprolol Tartrate 50 Mg Tabs (Metoprolol Tartrate) .... Take 1 Tablet By Mouth Three Times A Day 6)  Hydrochlorothiazide 25 Mg Tabs (Hydrochlorothiazide) .... Take One Daily 7)  Nitroglycerin 0.4 Mg Subl (Nitroglycerin) .... Take As Needed 8)  Albuterol Sulfate (5 Mg/ml) 0.5% Nebu (Albuterol Sulfate) .... As Needed 9)  Aspir-Low 81 Mg Tbec (Aspirin) .... Take One Daily 10)  Flecainide Acetate 100 Mg Tabs (Flecainide Acetate) .... Take 1 By Mouth Two Times A Day 11)  Antivert 25 Mg Tabs (Meclizine Hcl) .... Take As Needed 12)  Crestor 40 Mg Tabs (Rosuvastatin Calcium) .... Take 1 Tablet By Mouth Once Daily 13)  Combivent 18-103 Mcg/act Aero (Ipratropium-Albuterol) .... 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 Jude dual-chamber pacemaker, 3/11 (Dr. [name])  Social History: Last updated: 11/25/2008 Tobacco Use - Yes.  Alcohol Use - no  Review of Systems  The patient denies anorexia, fever, chest pain, syncope, dyspnea on exertion, melena, and hematochezia.         Otherwise reviewed and negative except as outlined.  Vital Signs:  Patient profile:   60 year old female Weight:      197 pounds BMI:     36.16 Pulse rate:   84 / minute BP sitting:   113 / 76  (left arm)  Vitals Entered By: Dreama Saa, CNA (December 04, 2010 3:07 PM)  Physical Exam  Additional Exam:  Obese woman in no acute distress. HEENT: Conjunctivae and lids normal, oropharynx clear. Neck: Supple, no elevated jugular venous pressure or loud bruits. Lungs: Clear to auscultation, nonlabored. Cardiac: Indistinct PMI, regular rate and rhythm, no S3 gallop. Abdomen: Obese, unable to palpate liver edge, bowel sounds present, nontender. Extremities: Trace peripheral edema, distal pulses one plus. Skin: Warm and dry. Musculoskeletal: No kyphosis noted. Neuropsychiatric: Alert and oriented x3, affect appropriate.   EKG  Procedure date:  12/04/2010  Findings:      Atrial paced rhythm at 67 beats per minute. Poor R-wave progression.  PPM Specifications Following MD:  Lewayne Bunting, MD  PPM Vendor:  [employer]     PPM Serial Number:  [id number] PPM DOI:  12/09/2009     PPM Implanting MD:  Lewayne Bunting, MD  Lead 1    Location: [facility]     DOI: 12/09/2009     Model #: [id number]     Serial #: [id number]     Status:  active Lead 2    Location: RV     DOI: 12/09/2009     Model #: [id number]     Serial #: [id number]     Status: active  Magnet Response Rate:  BOL 100 ERI 85  Indications:  Tachy-brady syndrome   PPM Follow Up Pacer Dependent:  No      Episodes Coumadin:  Yes  Parameters Mode:  DDDR     Lower Rate Limit:  60     Upper Rate Limit:  120 Paced AV Delay:  200     Sensed AV Delay:  200  Impression & Recommendations:  Problem # 1:  ATRIAL FIBRILLATION (ICD-427.31)  Symptomatically stable, continues on Coumadin.  Her updated medication list for this problem includes:    Coumadin 5 Mg Tabs (Warfarin sodium) ..... 5mg  once daily except 2.5mg  on tuesdays, thursdays and saturdays    Metoprolol Tartrate 50 Mg Tabs (Metoprolol tartrate) .[name]... Take 1 tablet by mouth three times a day    Aspir-low 81 Mg Tbec (Aspirin) .[name]... Take one daily    Flecainide Acetate 100 Mg Tabs (Flecainide acetate) .[name]... Take 1 by mouth two times a day  Problem # 2:  BRADYCARDIA-TACHYCARDIA SYNDROME (ICD-427.81)  Status post pacemaker placement. Patient due for followup with Dr. [name] for device interrogation.  Her updated medication list for this problem includes:    Amlodipine Besylate 5 Mg Tabs (Amlodipine besylate) .[name]... Take 1 tablet by mouth once a day    Coumadin 5 Mg Tabs (Warfarin sodium) ..... 5mg  once daily except 2.5mg  on tuesdays, thursdays and saturdays    Lisinopril 20 Mg Tabs (Lisinopril) .[name]... Take 1 tablet by mouth once a day    Metoprolol Tartrate 50 Mg Tabs (Metoprolol tartrate) .[name]... Take 1 tablet by mouth three times a day    Nitroglycerin 0.4 Mg Subl (Nitroglycerin) .[name]... Take as needed    Aspir-low 81 Mg Tbec (Aspirin) .[name]... Take one daily    Flecainide Acetate 100 Mg Tabs (Flecainide acetate) .[name]... Take 1 by mouth two times a day  Problem # 3:  ESSENTIAL HYPERTENSION, BENIGN (ICD-401.1)  Blood pressure well controlled today.  Her updated medication list for this problem includes:     Amlodipine Besylate 5 Mg Tabs (Amlodipine besylate) .[name]... Take 1 tablet by mouth once a day    Lisinopril 20 Mg Tabs (Lisinopril) .[name]... Take 1 tablet by mouth once a day    Metoprolol Tartrate 50 Mg Tabs (Metoprolol tartrate) .[name]... Take 1 tablet by mouth three times a day    Hydrochlorothiazide 25 Mg Tabs (Hydrochlorothiazide) .[name]... Take one daily    Aspir-low 81 Mg Tbec (Aspirin) .[name]... Take one daily  Patient Instructions: 1)  Your physician recommends that you schedule a follow-up appointment in: 6 months 2)  Your physician recommends that you continue on your current medications as directed. Please refer to the Current Medication list given to you today.

## 2010-12-15 ENCOUNTER — Encounter: Payer: Self-pay | Admitting: *Deleted

## 2010-12-19 NOTE — Progress Notes (Signed)
**Note De-Identified [name] Obfuscation** Summary: PT OUT OF RX  Phone Note Call from Patient Call back at Home Phone [phone]   Caller: PT Reason for Call: Refill Medication Summary of Call: LISINOPRIL AND HTCZ NEEDS CALLED IN TO [facility] [place]. Initial call taken by: Faythe Ghee,  December 11, 2010 10:28 AM    Prescriptions: HYDROCHLOROTHIAZIDE 25 MG TABS (HYDROCHLOROTHIAZIDE) take one daily  #30 x 6   Entered by:   Larita Fife Damario Gillie LPN   Authorized by:   Loreli Slot, MD, [facility]   Signed by:   Larita Fife Enzio Buchler LPN on 14/78/2956   Method used:   Electronically to        [facility] (retail)       [address]       [place], [place]  [address]       Ph: [phone]       Fax: [phone]   RxID:   [id number] LISINOPRIL 20 MG TABS (LISINOPRIL) Take 1 tablet by mouth once a day  #30 x 6   Entered by:   Larita Fife Chevy Virgo LPN   Authorized by:   Loreli Slot, MD, [facility]   Signed by:   Larita Fife Telesha Deguzman LPN on 25/95/6387   Method used:   Electronically to        [name] (retail)       [address]       [place], [place]  [address]       Ph: [phone]       Fax: [phone]   RxID:   [id number]

## 2010-12-20 ENCOUNTER — Encounter: Payer: Self-pay | Admitting: Pharmacist

## 2010-12-26 NOTE — Letter (Signed)
Summary: [facility] - Delinquent Coumadin 1  [facility]  [address] [place] [address]   Phone: [phone]  Fax: [phone]     December 20, 2010 MRN: [id number]   [facility] [address] [place], [place]  [address]   Dear Ms. Jolley,  This letter is being sent to you as a reminder that it is necessary for you to get your INR/PT checked regularly so that we can optimize your care.  Our records indicate that you were scheduled to have a test done recently.  As of today, we have not received the results of this test.  It is very important that you have your INR checked.  Please call our office at the number listed above to schedule an appointment at your earliest convenience.    If you have recently had your protime checked or have discontinued this medication, please contact our office at the above phone number to clarify this issue.  Thank you for this prompt attention to this important health care matter.  Sincerely, Vashti Hey RN  [facility] Team

## 2010-12-31 LAB — PROTIME-INR
INR: 1.42 (ref 0.00–1.49)
INR: 1.58 — ABNORMAL HIGH (ref 0.00–1.49)
Prothrombin Time: 17.2 seconds — ABNORMAL HIGH (ref 11.6–15.2)

## 2011-01-01 ENCOUNTER — Other Ambulatory Visit (HOSPITAL_COMMUNITY): Payer: Self-pay | Admitting: *Deleted

## 2011-01-01 DIAGNOSIS — Z139 Encounter for screening, unspecified: Secondary | ICD-10-CM

## 2011-01-05 ENCOUNTER — Other Ambulatory Visit: Payer: Self-pay | Admitting: Cardiology

## 2011-01-08 ENCOUNTER — Other Ambulatory Visit: Payer: Self-pay

## 2011-01-08 LAB — BASIC METABOLIC PANEL
BUN: 9 mg/dL (ref 6–23)
CO2: 27 mEq/L (ref 19–32)
Calcium: 8.9 mg/dL (ref 8.4–10.5)
Calcium: 9.4 mg/dL (ref 8.4–10.5)
Chloride: 100 mEq/L (ref 96–112)
Creatinine, Ser: 0.8 mg/dL (ref 0.4–1.2)
GFR calc Af Amer: >60 mL/min (ref >60)
GFR calc non Af Amer: >60 mL/min (ref >60)
GFR calc non Af Amer: >60 mL/min (ref >60)
Sodium: 136 mEq/L (ref 135–145)

## 2011-01-08 LAB — POCT CARDIAC MARKERS
CKMB, poc: 3.3 ng/mL (ref 1.0–8.0)
Troponin i, poc: <0.05 ng/mL (ref 0.00–0.09)

## 2011-01-08 LAB — CBC
Hemoglobin: 13.4 g/dL (ref 12.0–15.0)
MCV: 93.4 fL (ref 78.0–100.0)
Platelets: 166 10*3/uL (ref 150–400)
RBC: 4.2 MIL/uL (ref 3.87–5.11)
WBC: 11.4 10*3/uL — ABNORMAL HIGH (ref 4.0–10.5)

## 2011-01-08 LAB — DIFFERENTIAL
Basophils Relative: 0 % (ref 0–1)
Eosinophils Absolute: 0.2 10*3/uL (ref 0.0–0.7)
Lymphocytes Relative: 12 % (ref 12–46)
Lymphs Abs: 0.8 10*3/uL (ref 0.7–4.0)
Lymphs Abs: 1.2 10*3/uL (ref 0.7–4.0)
Monocytes Absolute: 1.2 10*3/uL — ABNORMAL HIGH (ref 0.1–1.0)
Monocytes Relative: 11 % (ref 3–12)
Neutro Abs: 7.8 10*3/uL — ABNORMAL HIGH (ref 1.7–7.7)
Neutrophils Relative %: 81 % — ABNORMAL HIGH (ref 43–77)

## 2011-01-13 ENCOUNTER — Other Ambulatory Visit: Payer: Self-pay | Admitting: *Deleted

## 2011-01-13 MED ORDER — FLECAINIDE ACETATE 100 MG PO TABS: 100.0000 mg | ORAL_TABLET | Freq: Two times a day (BID) | ORAL | Status: DC

## 2011-01-15 LAB — DIFFERENTIAL
Basophils Absolute: 0.1 10*3/uL (ref 0.0–0.1)
Basophils Absolute: 0 10*3/uL (ref 0.0–0.1)
Basophils Relative: 0 % (ref 0–1)
Basophils Relative: 0 % (ref 0–1)
Eosinophils Absolute: 0 10*3/uL (ref 0.0–0.7)
Eosinophils Absolute: 0.2 10*3/uL (ref 0.0–0.7)
Eosinophils Relative: 0 % (ref 0–5)
Eosinophils Relative: 2 % (ref 0–5)
Lymphocytes Relative: 16 % (ref 12–46)
Lymphs Abs: 0.5 10*3/uL — ABNORMAL LOW (ref 0.7–4.0)
Monocytes Absolute: 0.8 10*3/uL (ref 0.1–1.0)
Monocytes Absolute: 0.9 10*3/uL (ref 0.1–1.0)
Monocytes Relative: 6 % (ref 3–12)
Monocytes Relative: 1 % — ABNORMAL LOW (ref 3–12)
Monocytes Relative: 8 % (ref 3–12)
Neutro Abs: 9.8 10*3/uL — ABNORMAL HIGH (ref 1.7–7.7)

## 2011-01-15 LAB — CBC
HCT: 37.7 % (ref 36.0–46.0)
Hemoglobin: 13.7 g/dL (ref 12.0–15.0)
Hemoglobin: 12.9 g/dL (ref 12.0–15.0)
Hemoglobin: 13.5 g/dL (ref 12.0–15.0)
MCHC: 35.3 g/dL (ref 30.0–36.0)
MCHC: 35.7 g/dL (ref 30.0–36.0)
MCV: 93.1 fL (ref 78.0–100.0)
RBC: 4.14 MIL/uL (ref 3.87–5.11)
RBC: 3.92 MIL/uL (ref 3.87–5.11)
RDW: 13.9 % (ref 11.5–15.5)
WBC: 14.8 10*3/uL — ABNORMAL HIGH (ref 4.0–10.5)

## 2011-01-15 LAB — BLOOD GAS, ARTERIAL
Bicarbonate: 23.4 mEq/L (ref 20.0–24.0)
O2 Content: 2 L/min
O2 Saturation: 98.5 %
O2 Saturation: 98.1 %
Patient temperature: 37
TCO2: 20.8 mmol/L (ref 0–100)
pCO2 arterial: 33 mmHg — ABNORMAL LOW (ref 35.0–45.0)
pO2, Arterial: 113 mmHg — ABNORMAL HIGH (ref 80.0–100.0)
pO2, Arterial: 124 mmHg — ABNORMAL HIGH (ref 80.0–100.0)

## 2011-01-15 LAB — BASIC METABOLIC PANEL
Calcium: 8.8 mg/dL (ref 8.4–10.5)
Creatinine, Ser: 0.73 mg/dL (ref 0.4–1.2)
GFR calc non Af Amer: >60 mL/min (ref >60)
Glucose, Bld: 106 mg/dL — ABNORMAL HIGH (ref 70–99)
Sodium: 137 mEq/L (ref 135–145)

## 2011-01-15 LAB — COMPREHENSIVE METABOLIC PANEL
ALT: 22 U/L (ref 0–35)
AST: 24 U/L (ref 0–37)
Alkaline Phosphatase: 57 U/L (ref 39–117)
CO2: 25 mEq/L (ref 19–32)
Calcium: 8.7 mg/dL (ref 8.4–10.5)
GFR calc Af Amer: >60 mL/min (ref >60)
GFR calc non Af Amer: >60 mL/min (ref >60)
Glucose, Bld: 202 mg/dL — ABNORMAL HIGH (ref 70–99)
Potassium: 3.9 mEq/L (ref 3.5–5.1)
Sodium: 142 mEq/L (ref 135–145)

## 2011-01-15 LAB — POCT I-STAT, CHEM 8
BUN: 17 mg/dL (ref 6–23)
Calcium, Ion: 1.1 mmol/L — ABNORMAL LOW (ref 1.12–1.32)
Chloride: 107 mEq/L (ref 96–112)
Creatinine, Ser: 0.8 mg/dL (ref 0.4–1.2)
TCO2: 23 mmol/L (ref 0–100)

## 2011-01-15 LAB — POCT CARDIAC MARKERS
CKMB, poc: <1 ng/mL — ABNORMAL LOW (ref 1.0–8.0)
Myoglobin, poc: 56 ng/mL (ref 12–200)
Troponin i, poc: <0.05 ng/mL (ref 0.00–0.09)

## 2011-01-15 LAB — BRAIN NATRIURETIC PEPTIDE: Pro B Natriuretic peptide (BNP): 97.9 pg/mL (ref 0.0–100.0)

## 2011-01-15 LAB — CK TOTAL AND CKMB (NOT AT ARMC)
CK, MB: 2.1 ng/mL (ref 0.3–4.0)
Total CK: 101 U/L (ref 7–177)

## 2011-01-15 LAB — TSH: TSH: 0.245 u[IU]/mL — ABNORMAL LOW (ref 0.350–4.500)

## 2011-01-15 LAB — CARDIAC PANEL(CRET KIN+CKTOT+MB+TROPI)
CK, MB: 2.2 ng/mL (ref 0.3–4.0)
CK, MB: 2.8 ng/mL (ref 0.3–4.0)
Relative Index: INVALID (ref 0.0–2.5)
Total CK: 90 U/L (ref 7–177)

## 2011-01-15 LAB — PROTIME-INR
INR: 2.5 — ABNORMAL HIGH (ref 0.00–1.49)
Prothrombin Time: 28.3 seconds — ABNORMAL HIGH (ref 11.6–15.2)

## 2011-01-18 ENCOUNTER — Encounter: Payer: Self-pay | Admitting: Cardiology

## 2011-01-18 DIAGNOSIS — Z7901 Long term (current) use of anticoagulants: Secondary | ICD-10-CM

## 2011-01-18 DIAGNOSIS — I4891 Unspecified atrial fibrillation: Secondary | ICD-10-CM

## 2011-01-23 ENCOUNTER — Ambulatory Visit (HOSPITAL_COMMUNITY)
Admission: RE | Admit: 2011-01-23 | Discharge: 2011-01-23 | Disposition: A | Discharge status: Home / Self Care | Payer: Medicare Other | Source: Ambulatory Visit | Attending: Family Medicine | Admitting: Family Medicine

## 2011-01-23 ENCOUNTER — Encounter: Payer: Self-pay | Admitting: Internal Medicine

## 2011-01-23 ENCOUNTER — Ambulatory Visit (INDEPENDENT_AMBULATORY_CARE_PROVIDER_SITE_OTHER): Payer: Medicare Other | Admitting: Internal Medicine

## 2011-01-23 DIAGNOSIS — Z1231 Encounter for screening mammogram for malignant neoplasm of breast: Secondary | ICD-10-CM | POA: Insufficient documentation

## 2011-01-23 DIAGNOSIS — I4891 Unspecified atrial fibrillation: Secondary | ICD-10-CM

## 2011-01-23 DIAGNOSIS — Z950 Presence of cardiac pacemaker: Secondary | ICD-10-CM

## 2011-01-23 DIAGNOSIS — F17200 Nicotine dependence, unspecified, uncomplicated: Secondary | ICD-10-CM

## 2011-01-23 DIAGNOSIS — I495 Sick sinus syndrome: Secondary | ICD-10-CM

## 2011-01-23 DIAGNOSIS — Z139 Encounter for screening, unspecified: Secondary | ICD-10-CM

## 2011-01-23 NOTE — Assessment & Plan Note (Signed)
Her symptoms are well controlled. Will continue her current meds as she is maintaining NSR very nicely.

## 2011-01-23 NOTE — Assessment & Plan Note (Signed)
Her device is working normally. Will recheck in several months. 

## 2011-01-23 NOTE — Progress Notes (Signed)
HPI [name] returns today for followup. She is a pleasant, obese woman with HTN, PAF, symptomatic bradycardia s/p PPM. She denies c/p or sob but has not been able to lose weight or stop smoking. No syncope. She has rare palpitations. No peripheral edema. No Known Allergies   Current Outpatient Prescriptions  Medication Sig Dispense Refill  . albuterol (PROVENTIL) (2.5 MG/3ML) 0.083% nebulizer solution Take 2.5 mg by nebulization every 6 (six) hours as needed.        [name] albuterol-ipratropium (COMBIVENT) 18-103 MCG/ACT inhaler Inhale 2 puffs into the lungs every 6 (six) hours as needed.        [name] amLODipine (NORVASC) 5 MG tablet Take 5 mg by mouth daily.        [name] aspirin 81 MG tablet Take 81 mg by mouth daily.        . flecainide (TAMBOCOR) 100 MG tablet Take 1 tablet (100 mg total) by mouth 2 (two) times daily.  60 tablet  5  . hydrochlorothiazide 25 MG tablet Take 25 mg by mouth daily.        [name] lisinopril (PRINIVIL,ZESTRIL) 20 MG tablet Take 20 mg by mouth daily.        . meclizine (ANTIVERT) 25 MG tablet Take 25 mg by mouth as needed.        . metoprolol (LOPRESSOR) 50 MG tablet Take 50 mg by mouth 3 (three) times daily.        . nitroGLYCERIN (NITROSTAT) 0.4 MG SL tablet Place 0.4 mg under the tongue every 5 (five) minutes as needed.        . potassium chloride SA (KLOR-CON M20) 20 MEQ tablet        . rosuvastatin (CRESTOR) 40 MG tablet Take 40 mg by mouth daily.        [name] warfarin (COUMADIN) 5 MG tablet Take 5 mg by mouth daily. As directed       . DISCONTD: KLOR-CON M20 20 MEQ tablet TAKE 1 TABLET BY MOUTH TWICE A DAY  60 tablet  2     Past Medical History  Diagnosis Date  . Arrhythmia     atrial fibrillation  . CAD (coronary artery disease)     non-obstructive LVEF 60%  . COPD (chronic obstructive pulmonary disease)   . Hyperlipidemia   . Hypertension   . Myocardial infarction   . Tachycardia-bradycardia syndrome     ROS:   All systems reviewed and negative except as noted  in the HPI.   Past Surgical History  Procedure Date  . Appendectomy   . Total abdominal hysterectomy   . Insert / replace / remove pacemaker 12/2009     St.Jude dual chamber pacemaker      No family history on file.   History   Social History  . Marital Status: Widowed    Spouse Name: N/A    Number of Children: N/A  . Years of Education: N/A   Occupational History  . unemployed    Social History Main Topics  . Smoking status: Current Everyday Smoker  . Smokeless tobacco: Never Used  . Alcohol Use: No  . Drug Use: No  . Sexually Active: Not on file   Other Topics Concern  . Not on file   Social History Narrative  . No narrative on file     BP 122/69  Pulse 75  Ht 5\' 2"  (1.575 m)  Wt 196 lb (88.905 kg)  BMI 35.85 kg/m2  Physical Exam:  Well appearing NAD  HEENT: Unremarkable Neck:  No JVD, no thyromegally Lymphatics:  No adenopathy Back:  No CVA tenderness Lungs:  Clear. Well healed PPM incision HEART:  Regular rate rhythm, no murmurs, no rubs, no clicks Abd:  Flat, positive bowel sounds, no organomegally, no rebound, no guarding Ext:  2 plus pulses, no edema, no cyanosis, no clubbing Skin:  No rashes no nodules Neuro:  CN II through XII intact, motor grossly intact  DEVICE  Normal device function.  See PaceArt for details.   Assess/Plan:

## 2011-01-23 NOTE — Assessment & Plan Note (Signed)
Today we discussed the importance of smoking cessation. She has agreed to try and reduce her intake.

## 2011-01-23 NOTE — Patient Instructions (Signed)
Your physician recommends that you schedule a follow-up appointment in: 1 year Dr. [name]  6 months Gunnar Fusi

## 2011-02-14 ENCOUNTER — Ambulatory Visit (INDEPENDENT_AMBULATORY_CARE_PROVIDER_SITE_OTHER): Payer: Medicare Other | Admitting: *Deleted

## 2011-02-14 DIAGNOSIS — I4891 Unspecified atrial fibrillation: Secondary | ICD-10-CM

## 2011-02-14 DIAGNOSIS — Z7901 Long term (current) use of anticoagulants: Secondary | ICD-10-CM

## 2011-02-16 ENCOUNTER — Encounter: Payer: Self-pay | Admitting: Adult Health

## 2011-02-17 ENCOUNTER — Encounter: Payer: Self-pay | Admitting: Adult Health

## 2011-02-20 NOTE — Assessment & Plan Note (Signed)
[facility]                       [place] CARDIOLOGY OFFICE NOTE   AUDRINNA, SHERMAN                         MRN:          [id number]  DATE:07/03/2007                            DOB:          1951-06-12    [name] returns today for followup. She has had PAF in the hospital  with palpitations. She had a cath with no significant coronary disease  and good LV function. She continues to have exertional dyspnea which is  probably functional secondary to her weight. We were concerned about  recurrent paroxysmal atrial fibrillation. She had an event monitor which  primarily showed PACs and PVCs. In general, her baseline heart rate  still remains high. I think she continues to need to be on Coumadin. We  will increase her metoprolol to 50 b.i.d. Otherwise, she is stable. Her  review of systems is remarkable for a lack of primary care followup. She  used to see Dr.  Sherral Hammers in [place], but has not been seen in over a  year and a half. I encouraged her to followup.   CURRENT MEDICATIONS:  1. Warfarin as directed.  2. Lisinopril 20 a day.  3. Metoprolol 50 b.i.d.  4. Klor-Con 20 a day.  5. Hydrochlorothiazide 25 a day.  6. Clonidine 0.1 b.i.d.   PHYSICAL EXAMINATION:  Is remarkable for an overweight white female in  no distress. Weight is 186, blood pressure 142/78. Pulse 80 and regular.  Respiratory rate is 14. She is afebrile.  HEENT: Is normal. Carotids are normal without bruits. No JVP elevation.  There is no thyromegaly. No lymphadenopathy.  LUNGS:  Are clear with good diaphragmatic motion. No wheezing.  There is an S1, S2 with normal heart sounds. PMI is normal.  ABDOMEN: Is benign. Bowel sounds positive. No hepatosplenomegaly. No  hepatojugular reflux. No tenderness.  Femorals are +3 bilaterally without bruits. PT's are +2. There is trace  lower extremity edema.  NEURO: Is nonfocal.  SKIN: Is warm and dry.  There is no muscular weakness. Her  baseline EKG shows sinus rhythm with  LVH.   IMPRESSION:  1. History of paroxysmal atrial fibrillation. Continue warfarin and      increase beta-blocker. CardioNet monitor showing recurrences but      merely PACs and PVCs. Given the fact that she does not have      coronary disease and has good left ventricular function, I think      all we need to do is increase her beta-blocker.  2. Hypertension, currently well-controlled, continue current      medications including clonidine 0.1 b.i.d. and low salt diet.  3. Trace lower extremity edema. Continue hydrochlorothiazide 25 a day.      Currently stable.  4. Exertional dyspnea, functional.  Good left ventricular function.      Nutrition consult. The patient      needs to lose weight. She knows that her heart is functionally      sound and she can increase her activity levels without worry. We      will see her back in six months. She will call us  if she has any      prolonged episodes of palpitations.     [name]. Eden Emms, MD, [facility]  Electronically Signed    PCN/MedQ  DD: 07/03/2007  DT: 07/03/2007  Job #: [id number]

## 2011-02-20 NOTE — Cardiovascular Report (Signed)
[name], [name]                ACCOUNT NO.:  [id number]   MEDICAL RECORD NO.:  [id number]          PATIENT TYPE:  INP   LOCATION:  2033                         FACILITY:  [facility]   PHYSICIAN:  Bruce R. Juanda Chance, MD, FACCDATE OF BIRTH:  [birth year]   DATE OF PROCEDURE:  04/07/2007  DATE OF DISCHARGE:                            CARDIAC CATHETERIZATION   CLINICAL HISTORY:  [name]  is 60 years old and has a history of MI 10  years ago treated with TPA with subsequent angiography which showed  normal coronary angiography.  She also has paroxysmal atrial  fibrillation and is on chronic Coumadin therapy for this.  She also has  COPD.  She is admitted the hospital with increasing chest pain and  palpitations, and her Coumadin was held. She was scheduled for  evaluation angiography.   PROCEDURE NOTE:  The procedure was performed via the right femoral using  arterial sheath 5-French preformed coronary catheters.  A front wall  arterial puncture was performed. Omnipaque contrast was used.  After  completing the angiographic study, we decided to enroll the patient in  the SATURN trial for which she had been previously consented.  We  switched to a 6-French sheath.  We used a 6-French JR-4 guiding catheter  with side holes and a Prowater wire.  The patient was given weight-  adjusted heparin for ACT greater than 20 seconds.  We passed a Prowater  wire down the right coronary without difficulty.  We then passed an  Atlantis IVUS catheter down the right coronary artery and did automatic  pullback for greater than 40 mm.  The IVUS catheter and wire were then  removed, and final diagnostic study was performed through the guiding  catheter.  Right femoral artery was closed with Angio-Seal at the end of  the procedure.  The patient tolerated the procedure well.  She did  develop hypotension and bradycardia after the diagnostic study just  prior to the IVUS study and prior to any wire placement.  This  was  treated with IV fluids and dopamine and responded and then resolved.   RESULTS:  Left main coronary artery:  The left main coronary artery was  free of significant disease.   Left anterior descending artery:  The left anterior descending artery  gave rise to two diagonal branch and three septal perforators.  The LAD  was irregular with no major obstruction.   Circumflex artery:  The circumflex artery gives rise to a marginal  branch and posterolateral branch.  There is 30% narrowing in the  proximal mid circumflex artery.   Right coronary artery:  The right side is a moderate-size vessel which  gives rise to a conus branch, a right ventricular branch, posterior  descending branch, and three posterolateral branches.  There was  irregularity and 30% narrowing in the proximal right coronary artery.   Left ventriculogram:  The left ventriculogram performed in the RAO  projection showed good wall motion with no areas of hypokinesis.   The intravascular ultrasound of the proximal right coronary artery  showed a mild to moderate amount of  plaque which was nonobstructive.   CONCLUSION:  Nonobstructive coronary artery disease with irregularities  in the left anterior descending artery, 30% narrowing in the proximal  mid circumflex artery, 30% narrowing in the proximal right coronary  artery, and normal LV function.   RECOMMENDATIONS:  Reassurance.  The patient was enrolled in the SATURN  trial, and hopefully she will qualify for randomization with either  Crestor or Lipitor.  We will plan discharge tomorrow.  The etiology of  her pain is not clear.  By history today, some is exertional,and she has  a borderline abnormal stress test, so it is possible it could be  microvascular angina.  She does not have typical symptoms of reflux and  may give an empiric trial of a proton pump inhibitor.   INDICATIONS:  Prudence Davidson Juanda Chance, MD, [facility]  Electronically Signed      BRB/MEDQ  D:  04/07/2007  T:  04/07/2007  Job:  [id number]   cc:   [name]. [name], MD  Cardiopulmonary Lab

## 2011-02-20 NOTE — Consult Note (Signed)
NAMEJANNET, [name]                ACCOUNT NO.:  [id number]   MEDICAL RECORD NO.:  [id number]          PATIENT TYPE:  INP   LOCATION:  A319                          FACILITY:  [facility]   PHYSICIAN:  Gerrit Friends. Dietrich Pates, MD, FACCDATE OF BIRTH:  1951-08-24   DATE OF CONSULTATION:  03/31/2008  DATE OF DISCHARGE:  03/31/2008                                 CONSULTATION   REFERRING PHYSICIAN:  Dr. Elige Radon.   HISTORY OF PRESENT ILLNESS:  A 59 year old woman with a history of  previous myocardial infarction without focal coronary disease and a  history of paroxysmal atrial fibrillation admitted with chest  discomfort.  [name] presented approximately 10 years ago with what  reportedly was an acute myocardial infarction.  She was treated with  thrombolytic therapy, but subsequent cardiac catheterization revealed no  focal coronary disease.  She subsequently did well until recent years  when she developed paroxysmal atrial fibrillation.  This has responded  well to medical therapy.  She was admitted to the hospital in February  of 2008 with severe chest discomfort.  Cardiac catheterization at that  time revealed no progression of disease.  Hypertension was out of  control and responded to an increase in medical therapy.  She was  evaluated for palpitations earlier this year and underwent a stress  Myoview study in March that was, for the most part, negative.  There  were small perfusion abnormalities that may have represented scarring or  artifact.  There was no ischemia.   She now returns with recurrent chest discomfort.  The onset was while  she was at work, but not associated with any substantial exertion.  The  pain was moderate in intensity, intermittent, had a squeezing quality,  was located in the left chest just to the left of the sternal border.  There was some associated nausea and diaphoresis and perhaps some mild  dyspnea.  Chest discomfort is quite unusual for her.  EMS was  summoned  and transported her to the emergency department, from whence she was  admitted.  Myocardial infarction has been ruled out.  No EKG  abnormalities were identified.  She was not given sublingual  nitroglycerin, but symptoms improved after she was given transdermal  nitroglycerin in the emergency department.   PAST MEDICAL HISTORY:  Notable for hypertension that has been fairly  well controlled.  She also is a cigarette smoker with a diagnosis of  COPD.  Her most recent echocardiogram was in February of 2008, at which  time she had mild hypokinesis of the mid and distal septum with a low  normal overall ejection fraction.  LVH was present.   PAST SURGICAL HISTORY:  Remote appendectomy, hysterectomy and  laparoscopy.   ALLERGIES:  None.   CURRENT MEDICATIONS:  Lisinopril 20 mg daily, metoprolol 50 mg b.i.d.,  KCl 20 mEq daily, HCTZ 25 mg daily, clonidine 0.1 mg b.i.d., warfarin  7.5 mg 2 days with 5 mg 5 days.   FAMILY HISTORY:  Mother suffered a myocardial infarction in her 57s,  which was fatal.  Father died as a result of complications  of spinal  surgery.  One brother has COPD and coronary artery disease.   SOCIAL HISTORY:  Widowed and lives alone in [place].  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  A 30-pack-year history of cigarette  smoking.  No excessive use of alcohol.   REVIEW OF SYSTEMS:  Class II-III dyspnea on exertion; eats a regular  diet; intermittent constipation; impaired vision requiring corrective  lenses continuously; remote history of depression treated with Paxil;  all other systems reviewed and are negative.   PHYSICAL EXAMINATION:  On exam a pleasant woman in no acute distress.  The temperature is 98.2, heart rate 55 and regular, respirations 20,  blood pressure 120/55, O2 saturation 96% on room air.  Weight 82.6 kg,  height 62 inches.  HEENT:  Anicteric sclerae; normal lids and conjunctivae; normal oral  mucosa.  NECK:  No jugular  venous distention; normal carotid upstrokes without  bruits.  CARDIAC:  Normal first and second heart sounds; no murmur nor gallop  appreciated.  LUNGS:  Clear to auscultation; resonant to percussion.  ABDOMEN:  Soft and nontender; no masses; no organomegaly.  EXTREMITIES:  No edema; distal pulses intact.  NEUROLOGICAL:  Symmetric strength and tone; normal cranial nerves.   EKG shows no acute abnormalities.  CBC, chemistry profile and cardiac  markers are negative.  TSH is normal.  The lipid profile is good in  terms of a very low cholesterol of 97 and LDL of 54, but HDL is 26.   IMPRESSION:  [name] has experienced an episode of recurrent chest  discomfort.  There is nothing to suggest recurrent atrial fibrillation.  With a negative catheterization 1 year ago and a negative stress test a  few months ago, the likelihood of progression of coronary disease is  remote.  Coronary spasm is a possibility.  A noncardiac etiology is more  likely.  She should stay to complete her rule out myocardial infarction  protocol.  Otherwise, she can be discharged for office followup for  adjustment of medical therapy.      Gerrit Friends. Dietrich Pates, MD, [facility]  Electronically Signed     RMR/MEDQ  D:  04/02/2008  T:  04/02/2008  Job:  [id number]

## 2011-02-20 NOTE — Procedures (Signed)
[facility]                              EXERCISE [name], Kari                       MRN:          [id number]  DATE:01/06/2009                            DOB:          [birth year]    CARDIOLOGIST:  Jonelle Sidle, MD   ELECTROPHYSIOLOGIST:  [name]. [name], MD   HISTORY:  [name] is a 60 year old female with a history of coronary  artery disease and paroxysmal atrial fibrillation.  She had a recent  cardiac catheterization in June 2008, with nonobstructive disease.  She  had a followup Myoview study in November 2009, that demonstrated no  ischemia and her ejection fraction was in the normal range.  She has  been plagued by paroxysmal atrial fibrillation and recently saw Dr.  [name] who placed her on flecainide.  She titrated up from 50 mg to 100  mg twice a day and presents today for an exercise treadmill test to rule  out exercise-induced ventricular arrhythmias.   EXERCISE TREADMILL TEST:  The patient exercised according to Bruce  protocol for 3 minutes and 1 second.  She achieved a work level of 4.6  mets.  Her resting heart rate rose from 55 beats per minute to a maximum  of 94 beats per minute.  This represented 58% of her maximal age-  predicted heart rate.  Her blood pressure rose from 132/72 to a maximum  of 152/60.  The test was stopped secondary to exhaustion.   ELECTROCARDIOGRAM:  She remained in sinus rhythm throughout the test.  She had no ventricular arrhythmias.  No PVCs were noted.   IMPRESSION:  Submaximal graded exercise treadmill test.  No exercise-  induced ventricular arrhythmias at maximal exercise for the patient  while on flecainide therapy.   RECOMMENDATIONS:  The patient had no exercise-induced ventricular  arrhythmias at her maximal exercise level while on flecainide.  She will  continue on her flecainide as prescribed and followup with Dr. [name] as  directed.     Tereso Newcomer, PA-C  Electronically Signed      Jonelle Sidle, MD  Electronically Signed   SW/MedQ  DD: 01/06/2009  DT: 01/07/2009  Job #: [id number]   cc:   [name]. [name], MD

## 2011-02-20 NOTE — Assessment & Plan Note (Signed)
[facility]                       [place] CARDIOLOGY OFFICE NOTE   [name], [name]                       MRN:          [id number]  DATE:08/25/2008                            DOB:          [birth year]    ELECTROPHYSIOLOGIST:  [name]. [name], MD   REASON FOR VISIT:  Followup symptomatic paroxysmal atrial fibrillation.   HISTORY OF PRESENT ILLNESS:  I saw [name] earlier in the month.  Her  history is detailed in the previous note.  I referred her for both  CardioNet monitor and ischemic testing via a Myoview, given symptoms of  intermittent palpitations, nausea, and dyspnea on exertion.  Fortunately, her Myoview looked quite good revealing normal left  ventricular systolic function with no clear evidence of ischemia.  Her  CardioNet did demonstrate paroxysmal rapid atrial fibrillation with  heart rates up to the 130-150 range briefly.  She did have one 4-beat  wide complex rhythm that I suspect is most likely aberrantly conducted  atrial fibrillation, given the fact that she had no frank ischemia  documented recently and normal left ventricular systolic function.  I do  see that she had lab work done with one of her emergency department  visits and on the August 10, 2008, showing a potassium of 2.9.  This  has not been followed up as best I can tell.  I reviewed this  information with the patient and we talked about some possible  medication adjustments.  We also discussed referral back to Dr. [name]  to discuss the possibility of admission for Tikosyn initiation and  perhaps even the possibility of her being a candidate for atrial  fibrillation ablation down the road if this is not effective.   PRESENT MEDICATIONS:  1. Coumadin as directed by the [facility].  2. Lisinopril 20 mg p.o. daily.  3. Klor-Con 20 mg p.o. daily.  4. Hydrochlorothiazide 25 mg p.o. daily.  5. Metoprolol succinate 50 mg p.o. t.i.d. (change made at last visit  -      the patient has not been taking it this way, however).  6. Amlodipine 10 mg p.o. daily.  7. SATURN study drug.  8. Sublingual nitroglycerin 0.4 mg p.r.n.   REVIEW OF SYSTEMS:  As described in the history of present illness.  Otherwise, negative.   PHYSICAL EXAMINATION:  VITAL SIGNS:  Blood pressure is 110/70, heart  rate is 66, and weight is 185 pounds.  GENERAL:  The patient is comfortable and in no acute distress.  NECK:  No elevated jugular venous pressure.  No loud bruits.  LUNGS:  Clear with diminished breath sounds.  CARDIAC:  Regular rate and rhythm.  Soft systolic murmur.  No  pericardial rub or S3 gallop.  ABDOMEN:  Soft, nontender, and nondistended.  EXTREMITIES:  Exhibit no frank pitting edema.  Distal pulses are 2+.  SKIN:  Warm and dry.  MUSCULOSKELETAL:  No kyphosis noted.  NEUROPSYCHIATRIC:  The patient is alert and oriented x3.  Affect is  appropriate.   IMPRESSION/RECOMMENDATIONS:  Symptomatic paroxysmal atrial fibrillation  in the setting of known cardiovascular disease, status post previous  anterior wall myocardial infarction.  She had a recent followup Myoview  that was reassuring demonstrating normal left ventricular systolic  function with no evidence of frank ischemia.  She continues on Coumadin.  We discussed the matter and we will plan to increase her metoprolol to  TID dosing as previously recommened.  We did look into the possiblity of  changing to Toprol XL at BID dosing. however she felt like the cost was  too much, even for the generic.  I will also schedule a visit to see Dr.  [name] as I suspect she will likely ultimately come to antiarrhythmic  therapy, possibly Tikosyn through hospital admission, or perhaps later  on to an atrial fibrillation ablation.  She remains symptomatic.  Otherwise, I will have her come back over the next few months.     Jonelle Sidle, MD  Electronically Signed    SGM/MedQ  DD: 08/25/2008  DT: 08/26/2008   Job #: [id number]   cc:   [name]. [name], MD

## 2011-02-20 NOTE — Assessment & Plan Note (Signed)
[name]                       [place] [facility] OFFICE NOTE   [name], [name]                       MRN:          [id number]  DATE:01/19/2008                            DOB:          1951/03/07    [name] is a patient who was recently hospitalized on November 27, 2007.   She has a history of distant anterior wall myocardial infarction.  Cath  last year by Dr. Juanda Chance showed no critical coronary artery disease.  She  was seen in the hospital by Dr. Daleen Squibb.  She had recurrent PAF, and her  beta blocker was increased.  She subsequently was seen by Dr. Jens Som  in the [place] office on the 19th, and this is the first time that I  have seen her.   I had to review quite a few records on her.  In talking to the patient,  she has been doing fairly well.  Her Coumadin levels have been  therapeutic.  She has hypertension which probably contributes to her  PAF.  She had a stress Myoview study done on January 05, 2008 which was  read by Dr. Dietrich Pates, and it was essentially read as a probably negative  nuclear stress test with an EF of 53%.   In talking to the patient, she has had occasional flutters, nothing  lasting any length of time.  She is improved since hospital discharge.  She is not having any significant chest pain, PND, orthopnea.  There is  no shortness of breath.  She is actively working.   REVIEW OF SYSTEMS:  Otherwise negative.   CURRENT MEDICATIONS:  1. Coumadin for PAF.  2. Lisinopril 20 a day.  3. Klor-Con 20 a day.  4. Hydrochlorothiazide 25 a day.  5. Clonidine 0.1 b.i.d.  6. Metoprolol 75 b.i.d.  7. An aspirin a day.   PHYSICAL EXAMINATION:  GENERAL:  An overweight middle-aged white female  in no distress.  VITAL SIGNS:  Weight is 187.  Blood pressure 130/74, pulse 16 and  regular, respiratory 14, afebrile.  HEENT:  Unremarkable.  NECK:  Carotids are without bruit.  No lymphadenopathy, thyromegaly, JVP  elevation.  LUNGS:  Clear to diaphragmatic motion.  No wheezing.  CARDIOVASCULAR:  S1-S2, normal heart sounds.  PMI normal.  ABDOMEN:  Benign.  Bowel sounds positive.  No AAA, no tenderness.  No  hepatosplenomegaly or hepatojugular reflux.  EXTREMITIES:  Distal pulses are intact.  No edema.  NEUROLOGIC:  Nonfocal.  SKIN:  Warm and dry.  No muscular weakness.   IMPRESSION:  1. Coronary disease, distant history of anterior wall myocardial      infarction, ejection fraction preserved.  Catheterization in June      of 2008 showing no significant disease.  Recent stress test being      low-risk per Dr. Dietrich Pates.  2. Hypertension, currently well-controlled.  Continue current dose of      lisinopril and hydrochlorothiazide.  Consider combination drug in      the future.  3. Paroxysmal atrial fibrillation, infrequent recurrences.  I did talk      to her bit about  either using antiarrhythmics in the future.      Hopefully, she will not need these.  We will continue her high-dose      of beta blocker.  She will be seen in the Coumadin clinic.  4. In terms of her risk factors, the patient needs a lipid and liver      profile at some point in the future.   We will see her back in 3 months to see how her palpitations are.     [name]. Eden Emms, MD, [facility]  Electronically Signed    PCN/MedQ  DD: 01/19/2008  DT: 01/19/2008  Job #: [id number]

## 2011-02-20 NOTE — Discharge Summary (Signed)
Kari [name], [name]                ACCOUNT NO.:  [id number]   MEDICAL RECORD NO.:  [id number]          PATIENT TYPE:  INP   LOCATION:  A319                          FACILITY:  [facility]   PHYSICIAN:  Skeet Latch, DO    DATE OF BIRTH:  Jun 27, 1951   DATE OF ADMISSION:  03/30/2008  DATE OF DISCHARGE:  06/24/2009LH                               DISCHARGE SUMMARY   DISCHARGE DIAGNOSIS:  1. Chest pain.  2. Tobacco abuse.  3. History of paroxysmal atrial fibrillation.  4. History of previous myocardial infarction.  5. History of chronic obstructive pulmonary disease.   BRIEF HOSPITAL COURSE:  A 60 year old Caucasian female with history of  proximal atrial fibrillation, previous myocardial infarction,  palpitations, PACs, and PVCs who presented to [name] with chest  pain.  The patient states she started having chest discomfort while at  work.  She did not have any strenuous activities and started to have a  chest discomfort.  At the time of acute pain, she states it is 5 out of  10, and her coworkers called 911.  The patient did have some nausea and  diaphoresis and squeezing sensation on the left side of the chest when  the pain started.  By the time I saw the patient, her chest pain had  resolved.  She was on a nitroglycerin patch.  Initial EKG showed normal  sinus rhythm, rate of 78 bpm.  Repeat EKG showed sinus bradycardia with  questionable septal infarct, age undetermined.  Initial labs showed a  white count 8.6, hemoglobin 12.4, hematocrit 35.9.  Troponin was 0.01,  total CK 144, CK-MB 2.0.  Her electrolytes were unremarkable.  Her BUN  13, creatinine was 1.67.  Chest x-ray was unremarkable.  She was  admitted for chest pain.  Cardiology consult was obtained.  They felt  there is nothing that suggests recurrent atrial fibrillation.  She did  have a negative catheterization 1 year prior and negative stress test a  few months prior, so we thought the likelihood of progression of  coronary artery disease was remote.  She could have a coronary spasm.  The patient stayed overnight.  Again, they felt with a negative cath and  negative stress test, the patient was okay to be discharged.  She has a  followup cardiology appointment within the next 1-2 weeks.   VITALS ON DISCHARGE:  Temperature is 98.2, pulse 53, respirations 20,  blood pressure 119/54.   LABS ON DISCHARGE:  TSH was 0.580.  PT was 15.6, INR 1.2, sodium 139,  potassium 3.7, chloride 104, CO2 29, glucose 103, BUN 19, creatinine  0.78, white count 8.6, hemoglobin 12.4, hematocrit 35.9, platelet count  137.   MEDICATIONS ON DISCHARGE:  1. Hydrochlorothiazide 25 mg once a day.  2. Lisinopril 20 mg daily.  3. Metoprolol 50 mg 1/2 tablet twice daily.  4. Potassium chloride 20 mEq daily.  5. Norvasc 1 tablet daily.  6. Aspirin 325 mg daily.   CONDITION ON DISCHARGE:  Stable.   DISPOSITION:  The patient was discharged to home.   DISCHARGE INSTRUCTIONS:  The patient  to maintain a heart-healthy diet.  She is told to follow with her primary care physician and also told to  follow with [name] on April 12, 2008, for followup  appointment.  The patient was told to return to emergency room if she  had any severe recurrence of chest pain or call 911.      Skeet Latch, DO  Electronically Signed     SM/MEDQ  D:  04/17/2008  T:  04/18/2008  Job:  [id number]

## 2011-02-20 NOTE — Discharge Summary (Signed)
[name], [name]                ACCOUNT NO.:  [id number]   MEDICAL RECORD NO.:  [id number]          PATIENT TYPE:  INP   LOCATION:  2033                         FACILITY:  [facility]   PHYSICIAN:  Jesse Sans. Wall, MD, FACCDATE OF BIRTH:  1951/05/05   DATE OF ADMISSION:  04/04/2007  DATE OF DISCHARGE:  04/08/2007                               DISCHARGE SUMMARY   PRIMARY CARDIOLOGIST:  Dr. Lewayne Bunting   DISCHARGE DIAGNOSIS:  Chest pain.   SECONDARY DIAGNOSES:  1. Nonobstructive coronary artery disease.  2. Paroxysmal atrial fibrillation on chronic Coumadin therapy.  3. Prior history of myocardial infarction status post thrombolytic      therapy at age 67.  4. Tobacco abuse.  5. Chronic obstructive pulmonary disease.  6. Hypertension.  7. Status post appendectomy.  8. Hyperlipidemia.   ALLERGIES:  No known drug allergies.   PROCEDURES:  Left heart cardiac catheterization.   HISTORY OF PRESENT ILLNESS:  A 60 year old Caucasian female with a prior  history of MI approximately ten years ago which was treated successfully  with thrombolytic therapy.  She was in her usual state of health until  the morning of April 04, 2007 when she awoke from sleep with substernal  chest discomfort associated with nausea and diaphoresis as well as  shortness of breath.  She took some aspirin and oral Lopressor at home  and then called 911.  She was taken to the [facility] Emergency Room  where she was in sinus tachycardia with a blood pressure of 197/97.  She  was treated with nitroglycerin, morphine, heparin and then nitroglycerin  infusion with improvement in symptoms.  She was transferred to [facility] for further evaluation.   HOSPITAL COURSE:  Ms. Ruffino ruled out for MI by cardiac markers.  Her  admission INR was therapeutic at 2.0 and decision was made to pursue  left heart cardiac catheterization and; therefore, her Coumadin was held  over the weekend.  She underwent left heart cardiac  catheterization June  30 revealing nonobstructive coronary artery disease with an EF of 55%.  There was no evidence of coronary vasospasm on catheterization.  She was  reinitiated on her Coumadin and also placed on proton pump inhibitor  therapy without recurrent symptoms.  She was also enrolled in the [facility], comparing rosuvastatin to atorvastatin, and is otherwise being  discharged home today in satisfactory condition.   DISCHARGE LABS:  Hemoglobin 12.4, hematocrit 36.7, WBC 9.0, platelets  172, MCV 91.4.  Sodium 148, potassium 4.2, chloride 103, CO2 29, BUN 16,  creatinine 0.89, glucose 107.  PT 13.3, INR 1.0.  Total bilirubin 0.4,  alkaline phosphatase 69, AST 26, ALT 24, albumin 3.3.  CK 140, MB 30.2,  troponin I 0.03.  Calcium 8.9.  BNP was less than 30.  TSH 0.951.   DISPOSITION:  Patient is being discharged home today in good condition.   FOLLOWUP PLANS AND APPOINTMENTS:  She is to follow up with Dr. Lubertha Basque  nurse practitioner on July 11 at 3:45 p.m.   DISCHARGE MEDICATIONS:  1. SAT[employer] Research study drug.  2.  Omeprazole OTC 20 mg q.d.  3. Coumadin as previously prescribed.  4. Lisinopril 20 mg q.d.  5. HCTZ 25 mg q.d.  6. Klor-Con 20 mEq q.d.  7. Lopressor 50 mg b.i.d.  8. Clonidine 0.1 mg b.i.d.  9. Nitroglycerin 0.4 mg sublingual p.r.n. chest pain.   OUTSTANDING LABS/STUDIES:  None.   Duration of discharge encounter 41 minutes including physician time.      [name], [name]      Jesse Sans. Daleen Squibb, MD, [facility]  Electronically Signed    CB/MEDQ  D:  04/08/2007  T:  04/08/2007  Job:  [id number]

## 2011-02-20 NOTE — Assessment & Plan Note (Signed)
[facility]                         ELECTROPHYSIOLOGY OFFICE NOTE   SANYIAH, KANZLER                       MRN:          [id number]  DATE:12/07/2008                            DOB:          September 03, 1951    Ms. Dethlefs returns today for followup.  She is a very pleasant middle-aged  woman with a history of paroxysmal atrial fibrillation, hypertension,  and palpitations.  The patient returns today for followup.  I saw her  several months ago, and at that time because of her symptomatic AFib I  recommend that she start flecainide therapy.  With this done, she is  here today for followup.  The patient notes that she did not start  taking her medication because of inability to pay for her flecainide  which was over 50 dollars a month.  Otherwise, she notes that she  continues to have palpitations and bursts of atrial fibrillation.   Her medications include:  1. Warfarin as directed.  2. Lisinopril 20 a day.  3. Potassium 20 a day.  4. Hydrochlorothiazide 25 a day.  5. Metoprolol 50 three times a day.  6. Amlodipine 5 a day.  7. Aspirin 81 a day.   On physical examination, she is a pleasant middle-aged woman in no  distress.  Blood pressure was 140/78, the pulse 66 and regular,  respirations were 18.  The weight was 180 pounds.  Neck revealed no  jugular venous distention.  The lungs were clear bilaterally to  auscultation.  No wheezes, rales, or rhonchi are present.  There was no  increased work of breathing.  The cardiovascular exam revealed a regular  rate and rhythm.  Normal S1 and S2.  There are no murmurs, rubs, or  gallops present.  Abdominal exam was soft and nontender.  There was no  organomegaly.  Extremities demonstrated no cyanosis, clubbing, or edema.  The pulses were 2+ and symmetric.   IMPRESSION:  1. Paroxysmal atrial fibrillation.  2. Hypertension.  3. Obesity.   DISCUSSION:  Ms. Botello is currently trying to obtain disability, and  if  she does I have recommended that she start on flecainide, and I have  given her instructions about starting this at 50 twice a day and  increasing of 100 twice a day followed by exercise treadmill testing 2  weeks after initiation of flecainide.  If she by chance goes out of  rhythm and she is not on  flecainide, she can take extra metoprolol as needed.  I will see the  patient back in several months.     [name]. [name], MD  Electronically Signed    GWT/MedQ  DD: 12/07/2008  DT: 12/08/2008  Job #: [id number]

## 2011-02-20 NOTE — H&P (Signed)
[name], [name]                ACCOUNT NO.:  [id number]   MEDICAL RECORD NO.:  [id number]          PATIENT TYPE:  INP   LOCATION:  2913                         FACILITY:  [facility]   PHYSICIAN:  [name]. [name], MD    DATE OF BIRTH:  1951-01-23   DATE OF ADMISSION:  04/04/2007  DATE OF DISCHARGE:                              HISTORY & PHYSICAL   CODE STATUS:  The patient is a full code.   CARDIOLOGIST:  Dr. Lewayne Bunting.   HISTORIAN:  The patient; she was a good historian.   TOTAL VISIT TIME:  Approximately 45 minutes.   CHIEF COMPLAINT:  Chest pain.   HISTORY OF PRESENT ILLNESS:  [name] is a 60 year old female with  history of coronary artery disease, status post myocardial infarction by  medical report in December 1998 and the patient had lytic therapy at  that time with subsequent left heart catheterization procedure showing  no critical stenosis.  She has a history of atrial fibrillation,  paroxysmal episode, in February 2008.  She has a history of  anticoagulation for atrial fibrillation.  She presents with chest pain  and palpitation.  Since her myocardial infarction, which at the time was  associated with terrible chest pain that radiated to bilateral shoulders  and down the arms and was associated with diaphoresis, she notes since  that chest pain episode associated with her MI, a chest pain once a week  on average that is mild, associated with over-exertion.  She notes that  her chest pain woke her up from sleep approximately midnight on the  morning of admission and it was described as a hurt that she could not  specify any further; this was associated with nausea and sweats.  It was  substernal with no radiation and was associated with shortness of  breath.  The patient took 12.5 mg of Lopressor by mouth and she also  took an aspirin, a full dose, that did not help the pain much.  The pain  was associated with significant tachy-palpitations and she felt that she  was going to pass out.  She call 911 at approximately 12:30 a.m.  The  patient notes she checked her blood pressure and it was 197/97 by a  machine and her pulse was greater than 120.  The patient, in the  emergency room at [facility], once transferred by EMS, was given  sublingual nitroglycerin, morphine, heparin drip and nitroglycerin drip.  She was given a heparin bolus prior to the nitroglycerin drip.  The  patient was also given Lopressor 25 mg by mouth.  The patient notes that  she has been having what sounds like intermittent paroxysmal nocturnal  dyspnea that has not worsened and she has baseline longstanding  orthopnea.  The patient had 10/10 pain initially when the chest pain  began and it was 9/10 in the emergency room at [facility] 4/10 chest  pain here; she was given 5 of Lopressor IV x3, with chest pain  decreasing to 0/10.  The patient notes a chronic cough and notes feeling  feverish today.  PAST MEDICAL AND SURGICAL HISTORY:  1. Coronary artery disease, status post MI at the age of 52; she was      given a lytic, then catheterized with no residual stenosis      subsequently.  2. She has a history of chest palpitations and paroxysmal atrial      fibrillation episode in February 2008.  She has a history of      anticoagulant therapy.  3. History of hypertension.  4. History of smoking, history long-standing.  5. History of COPD.  6. She is status post appendectomy.  7. Her echocardiogram on November 19, 2006 showed left ventricular      ejection fraction of 55%, possibly mildly reduced, hypokinetic,      mid, distal and septal wall.  Left ventricular hypertrophy was      noted and the left atrium was mildly dilated at 41 mm.   ALLERGIES:  No known drug allergies.   MEDICATIONS:  1. Warfarin 5 mg by mouth daily.  2. Klor-Con M20 daily.  3. Hydrochlorothiazide 25 mg daily.  4. Lisinopril 20 mg by mouth daily.  5. Metoprolol 50 mg twice a day.  6. Clonidine 0.1 mg  twice a day.   SOCIAL HISTORY:  She lives in [place].  She lives alone.  She is a  widow.  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  She  smokes 1 pack per day and has been doing so for the last 30 years.  No  alcohol or illicit drug use.   FAMILY HISTORY:  Positive for the mother who passed away of an MI at the  age of 60.  Father passed away due to complications of a spinal surgery  at 48.  One brother with COPD and coronary artery disease.   REVIEW OF SYSTEMS:  Ten-point review of systems is negative, other than  stated above.   PHYSICAL EXAMINATION:  VITAL SIGNS:  Temperature is 99.1 with a blood  pressure of 156/76, respiratory rate of 14, pulse of 64 and SATs of 99%  on room air.  GENERAL:  She is an obese female lying in bed in no acute distress with  incline on the bed at 30 degrees.  HEENT:  Eyes:  Her pupils are equal, round and reactive to light.  Extraocular movements are intact.  Oropharynx:  Shows no oropharyngeal  lesions.  NECK:  Supple with no lymphadenopathy or thyromegaly.  CARDIOVASCULAR:  Exam discloses a regular rhythm and rate on  auscultation with no murmurs, rubs, or gallops, normal S1 and S2, no S3  or S4, no carotid bruits, no jugulovenous distention, no radial pulse  abnormalities.  Pulses are 2+ bilaterally.  Her dorsalis pedis pulses  are 2+ bilaterally as well with no asymmetry.  ABDOMEN:  Obese, soft, nontender and non-distended with normoactive  bowel sounds.  LUNGS:  Clear to auscultation bilaterally.  EXTREMITIES:  No clubbing, cyanosis, or edema.  NEUROLOGIC:  She is alert and oriented x4 with cranial nerves II-XII  grossly intact.  Strength and sensation grossly intact.   OUTSIDE HOSPITAL LABORATORY DATA:  White count of 8.9 with a hemoglobin  of 13.1, segs of 66%, hematocrit is 37.8, platelets 182,000.  Sodium 137  with a potassium of 3.4, chloride 103, bicarb 26, BUN 13, creatinine  0.63, glucose 121.  INR of 2.0, PT 23.9.   BNP less than 30.   EKG in May 2007 was sinus rhythm at 66 with a short P-R interval  with  questionable septal infarct.  There are concave ST-T segments  inferolaterally with PVCs noted.   EKG performed at the ER at time of arrival there showed normal sinus  rhythm at 75 with a short P-R interval, normal axis, normal intervals  with concave ST segments inferolaterally.   EKG here showed normal sinus rhythm at 71 with a short P-R interval with  inferolateral ST abnormalities, possible slight ST depression in those  areas.   Her chest x-ray is pending.   ASSESSMENT AND PLAN:  This is a patient with a history of coronary  artery disease, status post myocardial infarction, with a history of  paroxysmal atrial fibrillation and now chest pain and palpitations, with  concerns for acute coronary syndrome and/or atrial fibrillation with  rapid ventricular response, since resolved and undocumented.   1. For coronary syndrome, we will rule out myocardial infarction with      cardiac markers x3.  We will place her on a beta blocker therapy      orally.  She will be on aspirin.  Lipid panel will be ordered and      we will start a statin.  We will continue her lisinopril.  Heparin      drip and nitroglycerin drip will be weaned.  She will be held      nothing-by-mouth for possible evaluation of her coronary blood      flow.  2. For her palpitations, we will place her on telemetry.  She is      status post Lopressor intravenously x3, five milligrams each.  We      will place her on 50 mg p.o. q.6 h.  We will check a TSH.  3. For her tobacco abuse, she was counseled concerning discontinuing      tobacco use.  4. For her hypokalemia, we will replete, as this may be lower her      threshold for arrhythmias.  5. Her gastrointestinal prophylaxis will be with Protonix.  6. For deep venous thrombosis prophylaxis, she is on heparin drip.      Darryl D. Prime, MD   Electronically Signed      ______________________________  [name]. [name], MD    DDP/MEDQ  D:  04/04/2007  T:  04/04/2007  Job:  [id number]

## 2011-02-20 NOTE — Consult Note (Signed)
NAMEHIRAL, [name]                ACCOUNT NO.:  [id number]   MEDICAL RECORD NO.:  [id number]          PATIENT TYPE:  INP   LOCATION:  A319                          FACILITY:  [facility]   PHYSICIAN:  Gerrit Friends. Dietrich Pates, MD, FACCDATE OF BIRTH:  January 01, 1951   DATE OF CONSULTATION:  03/31/2008  DATE OF DISCHARGE:  03/31/2008                                 CONSULTATION   REFERRING PHYSICIAN:  Dr. Dorris Singh, DO.   PRIMARY CARE PHYSICIAN:  [facility] Department.   PRIMARY CARDIOLOGIST:  Dr. Theron Arista C. Eden Emms, MD, [facility].      Gerrit Friends. Dietrich Pates, MD, [facility]  Electronically Signed     RMR/MEDQ  D:  04/02/2008  T:  04/02/2008  Job:  [id number]

## 2011-02-20 NOTE — Consult Note (Signed)
[name], [name]                ACCOUNT NO.:  [id number]   MEDICAL RECORD NO.:  [id number]          PATIENT TYPE:  OBV   LOCATION:  IC06                          FACILITY:  [facility]   PHYSICIAN:  Gerrit Friends. Dietrich Pates, MD, FACCDATE OF BIRTH:  May 19, 1951   DATE OF CONSULTATION:  06/08/2008  DATE OF DISCHARGE:  06/08/2008                                 CONSULTATION   REFERRING PHYSICIAN:  Dr. Lilian Kapur of [facility].   CARDIOLOGIST:  She was previously followed by Dr. Charlton Haws.  She  will be followed in our [place] office now by Dr. Simona Huh.   PRIMARY CARE PHYSICIAN:  None.   REASON FOR CONSULTATION:  Chest pain.   HISTORY OF PRESENT ILLNESS:  [name] is a 60 year old female patient  with a history of coronary artery disease who presents to [facility] now with recurrent chest pain, palpitations and dizziness.  Her  history includes a myocardial infarction in 1998 treated with TPA.  She  did undergo recent cardiac catheterization in June 2008, that  demonstrated a proximal to mid 30% lesion in the circumflex  and a  proximal 30% lesion in the RCA.  She subsequently underwent a Myoview  study in March 2009 that revealed no ischemia and an EF of 53%.  She is  also on Coumadin therapy for a history of paroxysmal atrial  fibrillation.  She has had extensive workup recently with CardioNet  monitoring that revealed PACs and PVCs and nonsustained ventricular  tachycardia.  Dr. Eden Emms apparently reviewed this with Dr. [name].  Given the fact that she had normal LV function, it was not felt that  further workup was necessary for her nonsustained ventricular  tachycardia.   She now presents with recurrent chest pain, palpitations and dizziness.  She was at rest yesterday when her symptoms began.  She noted associated  shortness of breath but denied any chest pain or arm or jaw pain.  She  did feel somewhat nauseated and diaphoretic.  She took one sublingual  nitroglycerin with relief.  Her most troubling symptom is that of  dizziness.  She describes a spinning sensation made worse with turning  her head to the right for over a year now.  She says she feels like she  is going to fall.  She seems to relate her palpitations and chest  discomfort to this symptom as well.   PAST MEDICAL HISTORY:  As outlined above.  In addition:  1. COPD.  2. Hypertension.  3. Hyperlipidemia.  4. She is status post appendectomy and total abdominal hysterectomy.   MEDICATIONS PRIOR TO ADMISSION:  1. Aspirin 161 mg daily.  2. Coumadin as directed.  3. Metoprolol 75 mg b.i.d.  4. Potassium 20 mEq b.i.d.  5. Amlodipine 5 mg daily.  6. Lisinopril 20 mg daily.  7. Hydrochlorothiazide 25 mg daily.   ALLERGIES:  No known drug allergies.   SOCIAL HISTORY:  The patient lives in [place], [place].  She is  a widow.  She has a 40-pack-year history of smoking, continues to  smoke  cigarettes per  day.  She works at a [employer].   FAMILY HISTORY:  Significant for CAD.  Her mother died from myocardial  infarction.  Her father is deceased, did have a history of hypertension.   REVIEW OF SYSTEMS:  Please see HPI.  Denies fever, chills, headache,  sore throat, rash, dysuria, hematuria, melena, hematochezia, dysphagia.  She does have a nonproductive cough that is chronic.  She denies  orthopnea or PND.  She denies pedal edema.  She does note shortness of  breath with exertion and describes NYHA class IIB to III symptoms.  She  also notes occasional chest pain with overexertion.  This is chronic  without change over the last several years.  Rest of the review of  systems are negative.   PHYSICAL EXAM:  GENERAL APPEARANCE:  She is a well-nourished, well-  developed female in no acute distress.  VITAL SIGNS:  Blood pressure is 132/61, pulse 60, respirations 15,  temperature 97.6, oxygen saturation 97% room air.  HEENT:  Normal.  NECK:  Without JVD.  LYMPHS:   Without lymphadenopathy.  ENDOCRINE:  Without thyromegaly.  CARDIAC:  Normal S1 and S2.  Regular rate and rhythm without murmur.  No  gallops.  LUNGS:  Decreased breath sounds bilaterally.  Expiratory wheezes  throughout.  SKIN:  Without rash.  ABDOMEN:  Soft, nontender with normoactive bowel sounds, no  organomegaly.  EXTREMITIES:  Without clubbing, cyanosis or edema.  MUSCULOSKELETAL:  Without joint deformity.  NEUROLOGIC:  She is alert and oriented x3.  Cranial nerves II-XII  grossly intact.  Modified Dix-Hallpike maneuver does elicit dizziness  when she turns her  head to the right.  I cannot appreciate any  nystagmus.   Chest x-ray reveals no acute disease.   EKG reveals normal sinus rhythm with heart rate of 62, normal axis, no  acute changes.   LABORATORY DATA:  White count 10,800, hemoglobin 12.7, platelet count  164,000.  Potassium 3.5, creatinine 0.66, glucose 118.  D-dimer 0.31.  INR 3.2.  Cardiac markers negative x3.   ASSESSMENT/PLAN:  1. Chest pain, palpitations and dizziness in a 60 year old female with      a history of myocardial infarction in 1998, treated with TPA and a      recent cardiac catheterization revealing nonobstructive coronary      artery disease and recent Myoview study negative for ischemia.  As      outlined above, she has had a thorough workup recently.  In the      hospital, she has had some bradycardia noted on telemetry monitor      with heart rates in the 30s, but this was while she was asleep,      possible possibly related to apnea.  No medication changes will be      made at this time.  She should be okay for discharge to home if her      subsequent cardiac markers are negative.  Her dizziness is most      consistent with vertigo.  Meclizine will be added to her medical      regimen at 12.5 mg 3 times a day.  We will also try to refer her      for vestibular rehabilitation.  The patient will be brought back in      follow-up in our  office in the next 1 month.  2. Dizziness.  As outlined above.  3. Paroxysmal atrial fibrillation on Coumadin therapy.  She will  continue follow-up in the Coumadin clinic.  4. Hyperlipidemia.  She has been enrolled in the SATURN study in the      past.  She will continue with this.   DISPOSITION:  Thank you very much for this consultation.  I will happy  to follow the patient throughout the remainder of this admission.      Tereso Newcomer, PA-C      Gerrit Friends. Dietrich Pates, MD, [facility]  Electronically Signed    SW/MEDQ  D:  06/09/2008  T:  06/09/2008  Job:  [id number]

## 2011-02-20 NOTE — Assessment & Plan Note (Signed)
[facility]                       [place] CARDIOLOGY OFFICE NOTE   [name], [name]                       MRN:          [id number]  DATE:08/12/2008                            DOB:          Nov 19, 1950    ELECTROPHYSIOLOGIST:  [name]. [name], MD   REASON FOR VISIT:  Worsening palpitations.   HISTORY OF PRESENT ILLNESS:  I just recently met [name] in the office  back in September.  Her history is detailed in my previous note.  She  has subsequently had increasing episodes of palpitations resulting in  evaluation in the emergency department.  She states that she begins to  feel tired followed by a flushed sensation in her face and then a sense  of rapid palpitations, followed ultimately by nausea.  These are  sporadic, perhaps lasting several minutes at a time at most and  occurring once weekly at least.  She has had no frank syncope.  She also  has more breathlessness with activity.  In reviewing her chart, I see an  electrocardiogram done from emergency department stay recently in which  she was in sinus rhythm.  Electrocardiogram today is also showing sinus  rhythm at 77 beats per minute with evidence of previous anteroseptal  infarct.  She has had a CardioNet monitor done several months ago that  demonstrated paroxysmal atrial fibrillation, but she states that her  symptoms are much worse since she underwent that monitoring.   MEDICATIONS:  1. Coumadin as directed by the Coumadin Clinic.  2. Lisinopril 20 mg p.o. daily.  3. Klor-Con 20 mEq p.o. daily.  4. Hydrochlorothiazide 25 mg p.o. daily.  5. Metoprolol 75 mg p.o. b.i.d.  6. Amlodipine 5 mg p.o. daily.  7. SATURN study drug.  8. Sublingual nitroglycerin 0.4 mg p.r.n.   REVIEW OF SYSTEMS:  As per in the history of present illness.  Otherwise, negative.   PHYSICAL EXAMINATION:  VITAL SIGNS:  Blood pressure is 140/80, heart  rate is 77, and weight is 184 pounds.  GENERAL:  The  patient is in no acute distress.  HEENT:  Conjunctiva is normal.  Oropharynx is clear.  NECK:  Supple.  No elevated jugular venous pressure.  No loud bruits.  No thyromegaly noted.  LUNGS:  Clear with diminished breath sounds.  CARDIAC:  Regular rate and rhythm.  Soft systolic murmur.  No  pericardial rub or S3 gallop.  ABDOMEN:  Soft and nontender.  EXTREMITIES:  Exhibit no frank pitting edema. Distal pulses are 2+.  SKIN:  Warm and dry.  MUSCULOSKELETAL:  No kyphosis noted.  NEUROPSYCHIATRIC:  The patient is alert and oriented x3.  Affect is  appropriate.   IMPRESSION AND RECOMMENDATIONS:  1. Worsening palpitations with previously documented paroxysmal atrial      fibrillation, although recent electrocardiograms are showing sinus      rhythm.  I suspect it is likely she is having breakthrough rapid      atrial fibrillation, although I would like to document this,      particularly as the possibility of an antiarrhythmic would be a  reasonable next step for symptom management.  She is already on      Coumadin.  I have asked her to change her metoprolol dosing to 50      mg p.o. t.i.d.  A CardioNet monitor will also be provided.  If she      is clearly having symptomatic breakthrough atrial fibrillation, I      will get her back to see Dr. [name] and likely have her considered      for admission to initiate Tikosyn therapy.  Failing this, an atrial      fibrillation ablation could be considered.  2. Known cardiovascular disease status post previous anterior wall      myocardial infarction with preserved left ventricular systolic      function and mild atherosclerosis at followup angiography in 2008.      She had a Myoview done very early in the year, although does report      worsening shortness of breath.  As we work through her potential      arrhythmia issues, we will plan a followup Myoview to exclude any      obvious progression and ischemic burden.  3. Followup will be  over the next few weeks.     Jonelle Sidle, MD  Electronically Signed    SGM/MedQ  DD: 08/12/2008  DT: 08/13/2008  Job #: [id number]

## 2011-02-20 NOTE — H&P (Signed)
NAMEFARHA, DANO                ACCOUNT NO.:  [id number]   MEDICAL RECORD NO.:  [id number]          PATIENT TYPE:  INP   LOCATION:  3703                         FACILITY:  [facility]   PHYSICIAN:  Rod Holler, MD     DATE OF BIRTH:  [birth year]   DATE OF ADMISSION:  12/20/2007  DATE OF DISCHARGE:  12/21/2007                              HISTORY & PHYSICAL   PRIMARY CARDIOLOGIST:  Charlton Haws, M.D.   CHIEF COMPLAINT:  Palpitations.   HISTORY OF PRESENT ILLNESS:  Ms. Clingenpeel is a 60 year old female with  history of paroxysmal atrial fibrillation, history of palpitations in  the past with PACs and PVCs who presented to the emergency department  with complaint of palpitations.  Tonight, she had one hour of  palpitations that she described as symptoms that were similar to her  previous episodes of atrial fibrillation.  During this episode, she had  a squeezing sensation in her heart, diaphoresis, nausea and shortness of  breath.  After arrival of EMS, she began to feel improved.  Currently,  she is at her baseline.  She has had no recent chest pain, no PND or  orthopnea, no lower extremity swelling, no syncope or presyncope.  She  is wearing a heart monitor due to recurrent complaints of palpitations.   PAST MEDICAL HISTORY:  1. History of paroxysmal atrial fibrillation, heart monitor in 2008      with PACs and PVCs.  2. History of MI, status post lytics, subsequent cardiac      catheterization at that time showed no residual stenosis, last      catheterization in June 2008 with 30% proximal circumflex, 30%      proximal RCA.  3. Hypertension.  4. Tobacco use.  5. COPD.  6. Status post appendectomy.   MEDICINES:  1. Lisinopril 20 mg p.o. daily.  2. Lopressor 50 mg p.o. b.i.d.  3. Potassium chloride 20 mEq p.o. daily.  4. Hydrochlorothiazide 25 mg p.o. daily.  5. Clonidine 0.1 mg p.o. b.i.d.  6. Coumadin 7.5 mg on Tuesday and Thursday, 5 mg every other day of      the week.   ALLERGIES:  No known drug allergies.   SOCIAL HISTORY:  The patient smokes one-half pack per day.   FAMILY HISTORY:  Mother with history of MI in her 10s.   REVIEW OF SYSTEMS:  All systems reviewed in detail and are negative  except as noted in the history of present illness.   PHYSICAL EXAMINATION:  VITAL SIGNS:  Temperature 97.4, blood pressure  115/53, heart rate 66, respiratory rate 40, oxygen saturation 98%.  GENERAL:  Obese female, alert and oriented x3, no apparent distress.  HEENT:  Atraumatic, normocephalic.  Pupils equal, round and react to  light.  Extraocular movements intact.  NECK:  Supple.  No adenopathy, no JVD, no carotid bruits.  CHEST:  Decreased breath sounds throughout with faint wheezing  throughout.  CORONARY:  Regular rhythm, normal rate.  Normal S1, S2.  No murmurs,  rubs or gallops.  ABDOMEN:  Soft, nontender, nondistended.  Active bowel sounds.  EXTREMITIES:  Trace lower extremity edema without clubbing or cyanosis.  NEUROLOGIC:  No focal deficits.   EKG shows normal sinus rhythm with nonspecific ST changes.   LABORATORIES:  White blood cell count 14.3, hematocrit 38, platelet  count 167.  CK-MB 1.9, troponin less than 0.05, myoglobin 110.  INR 2.8,  PTT 45.  Sodium 141, potassium 3.6, chloride 106, bicarb 30, BUN 9,  creatinine 0.8, glucose 113.   IMPRESSION AND PLAN:  A 60 year old female with a history of paroxysmal  atrial fibrillation who presented with 1 hour of palpitations similar to  previous atrial fibrillation symptoms.  The patient has on a heart  monitor but has not transmitted this episode.   PLAN:  1. Cardiovascular:  Admit the patient to a telemetry bed, home      cardiovascular medicines, rule out with serial cardiac enzymes,      daily EKG, transmit episode tonight for review.  2. Pulmonary:  Tobacco cessation consult.  3. Hematologic:  Daily INR.  4. Fluids, electrolytes nutrition:  Cardiac diet.      Rod Holler, MD   Electronically Signed     TRK/MEDQ  D:  12/20/2007  T:  12/21/2007  Job:  [id number]

## 2011-02-20 NOTE — Assessment & Plan Note (Signed)
[facility]                       [place] CARDIOLOGY OFFICE NOTE   THERESIA, [name]                         MRN:          [id number]  DATE:04/30/2007                            DOB:          August 06, 1951    [name] is seen today as a new patient by me.  She has previously been  seen by Dr. [name].  She was just discharged from the hospital about 2-  1/2 weeks ago.  The patient has a history of paroxysmal atrial  fibrillation on chronic Coumadin.  She has hypertension, mild lower  extremity edema.  She was admitted with substernal chest pain.  She was  cathed by Dr. Juanda Chance.  She had no significant epicardial coronary  disease.  Kari was a question of microvascular disease.   The patient's echo showed some left ventricular hypertrophy with  diastolic dysfunction, but she had good left ventricular function.   She was discharged from the hospital on her current medications  including Lopressor 25 b.i.d.  Since leaving the hospital, the patient  has had continued complaints.  First of all, she was not quite sure why  she was given nitroglycerin if her arteries looked fine.  She actually  wanted to see her coronary arteriogram.  It took me approximately 15  minutes to pull her pictures up on the computer, and we actually went  over them together.  She was very happy about this.   I explained to her that Dr. Juanda Chance thought she may have small vessel  disease, and that is why he gave her nitroglycerin.  Personally, I am  not sure of the diagnosis, and I did not encourage her to take  nitroglycerin if she had chest pain.   The patient also complains of continued dyspnea.  She does have some LVH  from her hypertension and may have some diastolic dysfunction.  Her  mitral inflow pattern suggested this.  She is currently on low-dose  hydrochlorothiazide, and this may need to be changed in the future to  Lasix.   She also continues to complain of  intermittent palpitations.  She say  that she continues to go in and out of atrial fibrillation.  It seems as  though she had a short burst of atrial fibrillation while in the  hospital.  I told her that this was important to know.  She is protected  from having a stroke on chronic Coumadin; however, I think she may be a  candidate for antiarrhythmics, possibly Betapace, and she does not have  coronary artery disease.   I told her the best approach to this would be to give her an event  monitor.   REVIEW OF SYSTEMS:  Otherwise negative.   CURRENT MEDICATIONS:  1. Warfarin as directed.  She needs a follow up appointment in 3      weeks.  2. Lisinopril 20 a day.  3. Lopressor 25 b.i.d.  4. Klor-Con 20 a day.  5. Hydrochlorothiazide 25 a day.  6. Clonidine 0.1 b.i.d.   PHYSICAL EXAMINATION:  VITAL SIGNS:  Weight of 182.  Blood pressure  132/78,  pulse 78 and regular.  She is afebrile.  Respiratory rate is 14.  HEENT:  Normal.  NECK:  Thyroid is normal without nodules.  Kari is no JVP elevation.  No bruits.  No lymphadenopathy.  LUNGS:  Clear with good diaphragmatic motion.  No wheezing.  HEART:  Kari is an S1 and S2 with normal heart sounds.  PMI is normal.  ABDOMEN:  Benign.  Bowel sounds positive.  Kari are no renal bruits.  No abdominal aortic aneurysm.  No tenderness.  No hepatosplenomegaly, no  hepatojugular reflux.  EXTREMITIES:  Distal pulses are intact.  Kari is mild edema with  varicosities, particular spider veins in the ankles and feet.  PTs are  +3.  NEUROLOGIC:  Nonfocal.  Kari is no muscular weakness.   ELECTROCARDIOGRAM:  Her current EKG is essentially normal, in sinus  rhythm, with LVH.   IMPRESSION:  1. Recent hospitalization for chest pain.  No epicardial coronary      disease.  She does have nitroglycerin to take in case she gets      chest pain.  I doubt that she has spasm or microvascular disease,      as this is a fairly rare diagnosis.  Overall, her  risk of problems      in the future are low.  2. Exertional dyspnea.  Question left ventricular hypertrophy with      diastolic dysfunction.  Continue low-dose diuretic and therapy for      blood pressure.  Possibly check BNP in the future.  3. Paroxysmal atrial fibrillation.  Currently stable in sinus rhythm      on beta blocker.  CardioNet monitor for the next 8 weeks.  If she      has recurrent paroxysmal atrial fibrillation at home, she will need      to be hospitalized for possible institution of antiarrhythmic      therapy.  4. Hypertension, currently under good control.  Continue beta blocker,      lisinopril and diuretic, as well as clonidine.  5. Lower extremity edema, likely secondary to salt intake, as well as      varicosities and spider veins.  Continue elevation of the legs at      the end of the day.  Low salt diet.  Continue hydrochlorothiazide      with potassium replacement.   I will see the patient back in 8-10 weeks to further assess her rhythm  by CardioNet monitor.     Kari Pick. Eden Emms, MD, [facility]  Electronically Signed    PCN/MedQ  DD: 04/30/2007  DT: 05/01/2007  Job #: [id number]

## 2011-02-20 NOTE — Assessment & Plan Note (Signed)
[facility]                       [place] CARDIOLOGY OFFICE NOTE   ZAHRIA, DING                       MRN:          [id number]  DATE:11/25/2008                            DOB:          Dec 04, 1950    REASON FOR VISIT:  Routine scheduled followup.   HISTORY OF PRESENT ILLNESS:  I have last saw Ms. Homesley back in November  2009.  She has a history of cardiovascular disease, status post previous  anterior wall myocardial infarction with overall normal left ventricular  systolic function noted in followup as well as mild atherosclerosis,  based on angiography from June 2008.  She had no residual ischemia on  followup Myoview in November 2009 with an ejection fraction in the  normal range.  Symptomatically, she has not been bothered by any  substantial degree of angina.  Her main complaints over the last few  months has been progressive palpitations with a known history of  paroxysmal atrial fibrillation on Coumadin and beta-blocker therapy.  I  referred her back to see Dr. [name] in December and he discussed placing  her on low-dose flecainide 50 mg p.o. b.i.d. to see if this would lead  to better symptom control.  Ms. Sagona states that she has not started the  medication and reports only 2 brief episodes of breakthrough  palpitations, since I saw her.  She actually states that she feels  fairly good in terms of her symptom control at this point on the present  regimen.  She is due to see Dr. [name] back in follow up in March.  Otherwise, she continues on Coumadin through our Coumadin Clinic with an  INR of 3.4 in late January.  She had a fall on the recent snow and had  an ecchymosis in her buttock area, which is resolving.  She had no major  bleeding problems, otherwise.  Today's electrocardiogram shows sinus  bradycardia at 55 beats per minute with evidence of previous  anteroseptal myocardial infarction.   ALLERGIES:  No known drug  allergies.   MEDICATIONS:  1. Coumadin as directed by the Coumadin Clinic to achieve an goal INR      of 2.0-3.0.  2. Lisinopril 20 mg p.o. daily.  3. Klor-Con 20 mEq p.o. daily.  4. Hydrochlorothiazide 25 mg p.o. daily.  5. Metoprolol 50 mg p.o. t.i.d.  6. Amlodipine 5 mg p.o. daily.  7. Nitroglycerin 0.4 mg p.r.n.   REVIEW OF SYSTEMS:  As outlined above.  She has occasional headaches.  No orthopnea or PND.  No claudication.  Otherwise, negative.   PHYSICAL EXAMINATION:  VITAL SIGNS:  Blood pressure is 140/80, heart  rate is 55, weight is 179 pounds, down from 183 in December.  GENERAL:  The patient is in no acute distress.  NECK:  No elevated jugular venous pressure.  No loud bruits.  No  thyromegaly.  LUNGS:  Diminished breath sounds.  No wheezing.  CARDIAC:  Regular rate and rhythm with a soft systolic murmur at the  base.  No S3 gallop.  EXTREMITIES:  No frank pitting edema and with 2+ distal pulses.  IMPRESSION AND RECOMMENDATIONS:  1. Cardiovascular disease, status post previous anterior wall      myocardial infarction with subsequent reassuring evaluation      including mild atherosclerosis at angiography in 2008 as well as no      ischemia by Myoview in November 2009.  Left ventricular ejection      fraction has been normal.  Medical therapy is recommended.  Smoking      cessation has also been discussed overtime.  She is to start back      on a 81 mg aspirin daily.  Otherwise, continue her medical regimen      with follow up over the next 6 months.  She has been in the SATURN      study for lipid management.  2. Paroxysmal atrial fibrillation, less bothered by symptoms of the      last few months.  She never initiated flecainide 50 mg p.o. b.i.d.      as was recommended by Dr. [name] in December.  We talked about this      some today and she will plan to keep her follow up with him in      March.  Otherwise, she is to follow up with the Coumadin Clinic.      Jonelle Sidle, MD  Electronically Signed    SGM/MedQ  DD: 11/25/2008  DT: 11/25/2008  Job #: [id number]   cc:   [name]. [name], MD

## 2011-02-20 NOTE — H&P (Signed)
[name], [name]                ACCOUNT NO.:  [id number]   MEDICAL RECORD NO.:  [id number]          PATIENT TYPE:  INP   LOCATION:  A319                          FACILITY:  [facility]   PHYSICIAN:  Skeet Latch, DO    DATE OF BIRTH:  05/12/1951   DATE OF ADMISSION:  03/30/2008  DATE OF DISCHARGE:  LH                              HISTORY & PHYSICAL   CHIEF COMPLAINT:  Chest pain.   PRIMARY CARDIOLOGIST:  [name], MD.   HISTORY OF PRESENT ILLNESS:  [name] is a 59 year old Caucasian female  who has a history of paroxysmal atrial fibrillation, previous myocardial  infarction, palpitations, PACs and PVCs, who presented with chest pain.  The patient states that she began having chest discomfort while at work.  The patient states that she was not having any strenuous activity during  the episode of chest pain.  She states it was approximately 5/10 when it  started.  The patient states at that time she sat down and her co-  workers called 9-1-1.  The patient did have some nausea and diaphoresis  and some squeezing sensation in her left chest when the pain started.  The patient stated that it waxes and wanes.  It is located in the left  side of her chest.  The patient denies any radiation and, as stated,  describes it as a 5/10.  At this time the patient states that her chest  pain is resolved.  The patient was given a nitroglycerin patch in the  emergency room.   PAST MEDICAL HISTORY:  1. Paroxysmal atrial fibrillation with a heart monitor in 2008 with      PACs and PVCs.  2. History of myocardial infarction approximately 10 years prior.  3. History of hypertension.  4. History of tobacco abuse.  5. History of COPD.   PAST SURGICAL HISTORY:  1. Cardiac catheterization, the last one in June 2008 with 30%      proximal circumflex, 30% proximal RCA.  2. Appendectomy.   SOCIAL HISTORY:  The patient was a 40+ year smoker, states that she quit  approximately 12 weeks prior.  No  history of alcohol or illicit drug  user.   HOME MEDICATIONS:  1. Lisinopril 20 mg daily.  2. Lopressor 50 mg twice a day.  3. Potassium chloride 20 mEq daily.  4. Hydrochlorothiazide 25 mg daily.  5. Clonidine 0.1 mg twice a day.  6. Coumadin 7.5 mg on Tuesday and Thursday, 5 mg every other day.   ALLERGIES:  No known drug allergies.   FAMILY HISTORY:  Mother with history of MI in her 64s.   REVIEW OF SYSTEMS:  Negative except for:  CARDIOVASCULAR:  Positive for  some chest pain.  GI:  She did have some nausea.  No vomiting.   PHYSICAL EXAM:  VITAL SIGNS:  Temperature is 98.0, pulse 61,  respirations 20, blood pressure 118/61.  She is saturating 94% on room  air.  GENERAL:  She is well-nourished, well-hydrated, well-developed, in no  acute distress.  She is alert and oriented x3.  HEENT:  Head is atraumatic, normocephalic.  Pupils are PERRLA, EOMI.  NECK:  Soft, supple, nontender, nondistended.  CARDIOVASCULAR:  Regular rate and rhythm.  No rubs, gallops or murmurs.  RESPIRATORY:  Lungs are clear to auscultation bilaterally.  No rales,  rhonchi or wheezing.  ABDOMEN:  Soft, nontender, nondistended.  Positive bowel sounds.  No  rigidity or guarding.  EXTREMITIES:  No clubbing, cyanosis, or edema.  NEUROLOGIC:  She is alert and oriented x3.  Cranial nerves II-XII are  grossly intact.   Initial EKG showed normal sinus rhythm with a rate of 78 bpm.  Repeat  EKG showed sinus bradycardia with a questionable septal infarct, age  undetermined.   LABS:  CBC:  White count 8.6, hemoglobin 12.4, hematocrit 35.9, platelet  count is 137,000.  Last troponin was less than 0.01, total CK 144, CK-MB  2.0.  PT 16.9, INR is 1.3.  Sodium 139, potassium 3.9, chloride 101, CO2  32, glucose 128, BUN 13, creatinine 1.67.   RADIOLOGIC STUDIES:  Chest x-ray was normal.   IMPRESSION:  1. Chest pain.  2. History of tobacco abuse.  3. History of paroxysmal atrial fibrillation.  4. History of  previous myocardial infarction.   PLAN:  1. The patient will be admitted to the service of [facility] to a      telemetry bed.  2. For her chest pain, the patient continues to have nitroglycerin      paste in place.  It seems to be improving her chest discomfort.      The patient will be continued on a beta blocker at this time.  We      will obtain a [facility] consult also at this time.  The      patient did have a previous, I believe, negative stress study back      in March 2009.  3. The patient's cardiac enzymes seem to be within normal limits.  We      will await any recommendations with cardiology at this time.  4. For hypertension, the patient will be placed on her home clonidine,      hydrochlorothiazide and her ACE      inhibitor.  5. The patient will be continued on DVT and GI prophylaxis at this      time.  6. The patient needs to be informed she probably needs a primary care      physician in the near future.      Skeet Latch, DO  Electronically Signed     SM/MEDQ  D:  03/31/2008  T:  03/31/2008  Job:  [id number]

## 2011-02-20 NOTE — H&P (Signed)
[name], [name]                ACCOUNT NO.:  [id number]   MEDICAL RECORD NO.:  [id number]          PATIENT TYPE:  INP   LOCATION:  A304                          FACILITY:  [facility]   PHYSICIAN:  Lonia Blood, M.D.      DATE OF BIRTH:  1951-03-29   DATE OF ADMISSION:  03/14/2009  DATE OF DISCHARGE:  LH                              HISTORY & PHYSICAL   PRIMARY CARE PHYSICIAN:  The patient is unassigned but follows with  cardiology.   PRESENTING COMPLAINT:  Shortness of breath.   HISTORY OF PRESENT ILLNESS:  The patient is a 60 year old female with  known history of coronary artery disease and atrial fibrillation who is  being followed by cardiology who now presents to the emergency room with  history of shortness of breath.  She has had some apparent atrial  fibrillation that was there transiently but resolved before she came to  the emergency room.  She felt the symptoms getting worse when she laid  down but no chest pain.  She has had some diaphoresis and palpitations.  She denies any cough.  No nausea, vomiting or diarrhea.  The patient was  also wheezing.  She tried some inhaler's but did not work.  She received  some breathing treatments in the emergency room that seemed to have  helped some.   PAST MEDICAL HISTORY:  Her past medical history is significant for:  1. Coronary artery disease.  2. Hypertension.  3. Atrial fibrillation.  4. Emphysema.  5. Dyslipidemia.  6. History of hypokalemia.  7. Status post hysterectomy.   ALLERGIES:  She has no known drug allergies.   MEDICATIONS:  1. Coumadin, currently at 5 mg daily.  2. Lipitor one tablet daily.  3. Potassium chloride 20 mEq twice a day.  4. Aspirin 81 mg daily.  5. Flecainide 100 mg b.i.d.  6. Metoprolol 50 mg 3 tablets daily.  7. Lisinopril 20 mg daily.  8. Hydrochlorothiazide 25 mg daily.  9. Amlodipine 5 mg daily.   SOCIAL HISTORY:  The patient lives in town in [place]. She smokes  about 1/2 to 1 pack  per day.  Denies any alcohol or IV drug use.   FAMILY HISTORY:  Noncontributory, mainly hypertension.   REVIEW OF SYSTEMS:  A 14 point review of systems is negative except in  HPI.   PHYSICAL EXAMINATION:  VITAL SIGNS:  Temperature is 98.9, blood pressure  130/56, pulse 54, respiratory rate 20, saturation 99% on room air.  GENERAL:  She is awake, alert, oriented, in no acute distress.  HEENT: PERL, EOMI.  NECK:  Supple.  No JVD.  No lymphadenopathy.  RESPIRATORY:  Shows fair air entry bilaterally with mild expiratory  wheezes.  CARDIOVASCULAR:  S1, S2, no murmurs.  ABDOMEN:  Soft, nontender.  Positive bowel sounds.  EXTREMITIES:  No cyanosis, clubbing or edema.   LABORATORY DATA:  White count 10.8 with a left shift. ANC of 8.2.  Hemoglobin 10.5, platelet count 143,000.  ABG showed pH 7.375, pCO2  48.9, pO2 124.  Initial cardiac enzymes are negative.  Chest x-ray shows  questionable COPD but no acute abnormalities.   ASSESSMENT:  This is a 60 year old female with known history of chronic  tobacco smoking and emphysema presenting with what appears to be acute  chronic obstructive pulmonary disease exacerbation.  The patient is  apparently not on any formal therapy for her chronic obstructive  pulmonary disease.  No chest pain.  Although she has history of coronary  artery disease, there does not appear to be any evidence of an acute  myocardial infarction.   PLAN:  1. Chronic obstructive pulmonary disease exacerbation.  Will admit the      patient and start her on nebulizer's, IV antibiotics and steroids.      Put her on some oxygen.  Once she is getting better we will titrate      her steroids off.  2. Hypertension.  Will continue her home medicines as much as      possible.  3. Atrial fibrillation.  Will continue with her Coumadin and also rate      control medications.  4. Coronary artery disease.  I will cycle her cardiac enzymes,      although she has no chest pain.  5.  Thrombocytopenia.  Seems to be stable and will continue to monitor      her closely.  6. Tobacco abuse.  The patient has been counseled and will offer her a      Nicotine patch in addition.  7. Bradycardia.  The patient's bradycardia probably is related to      medications.  She is on tons of medicines, including 3 pills of      metoprolol.  Will hold that and see how her heart rate does.  If      she becomes symptomatic, we will get cardiology to see her.      Lonia Blood, M.D.  Electronically Signed     LG/MEDQ  D:  03/15/2009  T:  03/15/2009  Job:  [id number]

## 2011-02-20 NOTE — Cardiovascular Report (Signed)
NAMEENRIKA, AGUADO                ACCOUNT NO.:  [id number]   MEDICAL RECORD NO.:  [id number]          PATIENT TYPE:  OIB   LOCATION:  1961                         FACILITY:  [facility]   PHYSICIAN:  Bruce R. Juanda Chance, MD, FACCDATE OF BIRTH:  Jan 18, 1951   DATE OF PROCEDURE:  05/31/2009  DATE OF DISCHARGE:                            CARDIAC CATHETERIZATION   CLINICAL HISTORY:  Ms. Maund is 60 years old and had a history of an MI  in 1998 treated with tPA with subsequent normal coronary angiography.  She also has paroxysmal atrial fibrillation on Coumadin.  Two years ago,  she was evaluated with angiography for chest pain and had nonobstructive  disease.  She was enrolled in the statin trial, which randomizes her to  Crestor 40 mg versus atorvastatin 80 mg.  She now returns for followup  angiography.   PROCEDURE:  Procedure was performed via the right femoral artery using  arterial sheath and 6-French preformed coronary catheters.  A front wall  arterial puncture was performed and Omnipaque contrast was used.  We did  the diagnostic study of the left coronary artery and then the left  ventriculogram and then we proceeded with a JR-4 guiding catheter with  side holes.  The patient was given weight adjusted heparin following ACT  greater than 200 seconds.  We passed a Prowater wire down the right  coronary artery without difficulty.  After giving intracoronary  nitroglycerin, we passed an Atlantis IVUS catheter down to the distal  vessel just around the ends and then we did automatic pullback.  Final  diagnostic study was then performed using guiding catheter.  The right  femoral artery was closed with Angio-Seal at the end of the procedure.  The patient tolerated the procedure well and left the laboratory in  satisfactory condition.   RESULTS:  Left main coronary artery.  The left main coronary artery is  free of significant disease.   Left anterior descending artery.  The left anterior  descending artery  gave rise to first diagonal branch, septal perforators, and several more  septal perforators, and small diagonal branches.  This vessel was  removed with no significant obstruction.   The circumflex artery.  The circumflex artery was a moderate-sized  vessel that gave rise to a marginal branch and a posterolateral branch.  There was a 20% stenosis in the proximal portion of the vessel.  This  was described as 30% on the previous study.   The right coronary artery.  The right coronary artery is a moderate-  sized vessel that gave rise to a conus branch, right ventricular branch,  posterior branch, and posterolateral branch.  There was 20% to 30%  narrowing in the proximal right coronary artery.  The vessel beds were  smooth and free of obstruction.   The left ventriculogram.  The left ventriculogram performed in the RAO  projection showed a small area of hypokinesis of the anterolateral wall.  The overall wall motion was good and the estimated ejection fraction was  60%.   The intravascular ultrasound study showed some mild-to-moderate  nonobstructive plaque in the  right coronary artery.  The rest of the  artery appeared mostly free of plaque.   CONCLUSION:  1. Mild nonobstructive coronary artery disease with no significant      obstruction in the LAD, 20% narrowing in the proximal circumflex      artery, 20% to 30% narrowing in the proximal right coronary artery      and anterolateral wall hypokinesis with an estimated ejection      fraction of 60%.  2. IVUS study as part of the statin trial demonstrating mild plaque in      the proximal right coronary artery.   RECOMMENDATIONS:  Reassurance.  We will need to transition the patient  to a prescribed statin following completion of the statin study.      Bruce Elvera Lennox Juanda Chance, MD, [facility]  Electronically Signed     BRB/MEDQ  D:  05/31/2009  T:  05/31/2009  Job:  [id number]

## 2011-02-20 NOTE — H&P (Signed)
NAMEMERIDA, Kari                ACCOUNT NO.:  [id number]   MEDICAL RECORD NO.:  [id number]          PATIENT TYPE:  OBV   LOCATION:  IC06                          FACILITY:  [facility]   PHYSICIAN:  [name], MD     DATE OF BIRTH:  November 25, 1950   DATE OF ADMISSION:  06/07/2008  DATE OF DISCHARGE:  LH                              HISTORY & PHYSICAL   CARDIOLOGIST:  Theron Arista C. Eden Emms, M.D., [facility]   PRIMARY CARE PHYSICIAN:  The patient does not have a primary medical  doctor.   ADMISSION DIAGNOSES:  1. Chest pain likely secondary to acid reflux disease.  2. Rule out acute coronary syndrome.  3. History of coronary artery disease.  4. History of emphysema.  5. Hypertension.   CHIEF COMPLAINT:  Chest pain and palpitations since this evening.   HISTORY OF THE PRESENT ILLNESS:  The patient is a 60 year old Caucasian  female with a past medical history of coronary artery disease more than  10 years ago, which resolved with thromboembolics.  She does not have  any focal CAD.  She also has a history of paroxysmal atrial fibrillation  for which she is on Coumadin.  The patient has a history of chronic  chest pain for which she has been evaluated in the form of a cardiac  cath one year ago, which was negative for any obstruction.  She had a  stress test  in March of this year, which was a low-risk study.  The  patient was again admitted in June of this year with chest pains and was  seen by a cardiologist.   The patient presented today after she experienced pressure-like  sensation in the retrosternal area of about 6/10 in intensity this last  evening at about 8:30 P.M.  This was a nonexertional pain.  There were  no other aggravating or alleviating factors.  The did tell that she does  have acid reflux because of the potassium pills that she takes on a  daily basis.  The patient did not experience any shortness of breath  that is more than usual.  She did not have any lightheadedness or  dizziness.  She did have some nausea.  She did have some palpitations,  which is chronic for her.   MEDICATIONS:  The medications at home that she is taking include:  1. Aspirin 162 mg once a day.  2. Coumadin 5 mg once a day.  3. Metoprolol 75 mg twice a day.  4. Klor-Con 20 mEq daily.  5. Amlodipine 5 mg daily.  6. Lisinopril 20 mg daily.  7. Hydrochlorothiazide 25 mg daily.  8. The patient is on a statin study by [facility] for statin.   ALLERGIES:  No known drug allergies.   PAST MEDICAL HISTORY:  The past medical history is  positive for:  1. Myocardial infarction more than 10 years ago with nonocclusive      coronary artery disease.  Cardiac cath in June 2008 showed mild      narrowing of about 30%, but nothing of concern.  She also had a low-  risk stress test in March 2009.  2. The patient has paroxysmal atrial fibrillation,  3. Emphysema; and,  4. Hypertension as well.   PAST SURGICAL HISTORY:  The patient's surgical history includes:  1. Hysterectomy.  2. Appendectomy.   SOCIAL HISTORY:  The patient lives in [place].  She smoke less than  one pack of cigarettes on a daily basis.  No alcohol use.  No illicit  drug use.   FAMILY HISTORY:  The family history is positive for heart disease, lung  disease and hypertension.   REVIEW OF SYSTEMS:  CONSTITUTIONAL:  The review of systems is positive  for a chronic cough.  HEENT:  Unremarkable.  CARDIOVASCULAR:  As noted  in the HPI.  RESPIRATORY:  As noted in the HPI.  GASTROINTESTINAL:  Unremarkable, except for heartburn.  GENITOURINARY:  Unremarkable.  NEUROLOGIC:  Unremarkable.  PSYCHIATRIC:.  Unremarkable.  DERMATOLOGIC:  Unremarkable.  MUSCULOSKELETAL:  Unremarkable.  LYMPHATICS:  Unremarkable.  ENDOCRINE:  Unremarkable.  All other systems at  unremarkable.   PHYSICAL EXAMINATION:  VITAL SIGNS:  Temperature 98.1, blood pressure  114/75, heart rate 74, respiratory rate 20, and saturation 98% on room  air.  GENERAL  APPEARANCE: On general exam this is an overweight white female  in no distress.  HEENT:  There is no pallor.  No icterus.  Oral mucous membranes are  moist.  No oral lesions are noted.  LUNGS:  The lungs are clear to auscultation bilaterally.  No wheezes,  rales or rhonchi.  HEART:  Cardiovascular - S1 and S2 are normal.  No murmurs are  appreciated.  No S3 or S4.  No rubs.  No bruits.  ABDOMEN:  The abdomen is soft, nontender and nondistended.  Bowel sounds  are present.  No mass or organomegaly is appreciated.  EXTREMITIES:  The extremities show no edema.  Peripheral pulses are  palpable.  NEUROLOGIC EXAMINATION:  No focal neurological deficit is present.  GENITALIA:  The genital is unremarkable.  The remainder of the examination is unremarkable.   LABORATORY DATA:  White count is 10.8 and the rest of the parameters are  normal.  INR is 3.4.  D-dimer is 0.31.  Potassium is 3.1 and glucose is  112.  Cardiac markers are negative times two.  LFTs are not available at  this time.  Her chest x-ray did not show any acute cardiopulmonary  process.  EKG shows T wave inversion in V1 and V2, otherwise sinus  rhythm with normal axis.  Intervals are in the normal range.  There are  no other concerning ST changes are noted.   ASSESSMENT:  This is a 60 year old white female who presents with chest  pain and palpitations.  These symptoms are chronic.  She has been  extensively tested in the form of a cardiac catheterization one year ago  as well as a low-rise stress test earlier this year.  I think her chest  pressure is noncardiac.  She does report heartburn, so this could be  related to acid reflux.  Liver function tests will be checked to make  sure that there is not a biliary process involved here.   PLAN:  1. Chest pain.  I have reassured the patient.  I was trying to send      this patient home.  I discussed the case with Dr. Oralia Rud with      [facility].  He prefers the patient be  seen by a      cardiologist before she  goes home.  So we will admit the patient to      the hospital on an observation basis.  We will consult [facility] in the morning to see this patient.  In the meantime I      will start her on Prilosec, check LFTs and repeat EKG in the      morning.  2. History of paroxysmal atrial fibrillation.  Coumadin will be held      tonight.  INR will be checked tomorrow morning.  3. Hypokalemia.  This will be repeated.  4. Hypertension.  We will continue with her antihypertensive agents.   I am anticipating this patient will be able to go home by tomorrow  evening once she rules out for an acute coronary syndrome.   NOTE:  This is a preliminary report until signing of this dictation.      [name], MD  Electronically Signed     GK/MEDQ  D:  06/08/2008  T:  06/08/2008  Job:  [id number]   cc:   Gerrit Friends. Dietrich Pates, MD, [facility]  [address]  [place], [place] [address]   [name], MD, [facility]  [address]  [place]  [place] [address]

## 2011-02-20 NOTE — Assessment & Plan Note (Signed)
[facility]                        CARDIOLOGY OFFICE NOTE   [name], [name]                       MRN:          [id number]  DATE:06/17/2008                            DOB:          12-01-1950    ELECTROPHYSIOLOGIST:  Kari Canning. [name], MD   REASON FOR VISIT:  Routine followup.   HISTORY OF PRESENT ILLNESS:  This is my first meeting with [name].  She was last seen in the office in April 2009 by Dr. Eden [name].  Her  history includes paroxysmal atrial fibrillation maintained with a  strategy of heart rate control on anticoagulation, hypertension, and  cardiovascular disease status post previous anterior wall myocardial  infarction with normal ejection fraction and followup angiography in  June 2008 demonstrating mild coronary atherosclerosis.  She is in the  SATURN study and continues followup through our [facility].  She  was recently admitted to the hospital with chest pain and ruled out for  myocardial infarction.  She was seen by Dr. Dietrich [name] in consultation and  comes in today for a followup.  Her last ischemic assessment was via  Myoview in March 2009, which demonstrated no frank ischemia and an  ejection fraction of 53%.  She states that her chest pain has resolved.  She also tells that she was referred for physical therapy to address  vertigo later in the month.  I reviewed with her the long-term issues  associated with atrial fibrillation.  I note that she wore event  recorder earlier in the year that demonstrated paroxysms of atrial  fibrillation and atrial tachycardia associated with palpitations.  Obviously, antiarrhythmics or perhaps even an ablation could be a  consideration if her symptoms progress, and we are unable to adequately  manage these with conservative methods.  In that case, I would refer her  back to Dr. Ladona [name].  She seems most comfortable with the present course  at this time.   MEDICATIONS:  1. Coumadin  as directed by the [facility].  2. Lisinopril 20 mg p.o. daily.  3. Klor-Con 20 mEq p.o. daily.  4. Hydrochlorothiazide 25 mg p.o. daily.  5. Metoprolol 75 mg p.o. b.i.d.  6. Aspirin 81 mg p.o. b.i.d.  7. Amlodipine 5 mg p.o. daily.  8. Omeprazole 20 mg p.o. daily.  9. SATURN study drug.  10.Sublingual nitroglycerin 0.4 mg p.r.n.   REVIEW OF SYSTEMS:  As per history of present illness.  No bleeding  problems.  Otherwise, negative.   PHYSICAL EXAMINATION:  VITAL SIGNS:  Blood pressure today 108/68, heart  rate is 60 and regular, weight is 182 pounds.  GENERAL:  The patient is comfortable, in no acute distress.  NECK:  No elevated jugular venous pressure.  No loud bruits.  No  thyromegaly is noted.  LUNGS:  Clear without labored breathing at rest.  CARDIAC:  Regular rate and rhythm.  Soft systolic murmur.  No S3 or  pericardial rub.  EXTREMITIES:  Exhibit no significant pitting edema.   IMPRESSION AND RECOMMENDATIONS:  1. Paroxysmal atrial fibrillation, well documented.  At this point, we      will continue  present medications including Coumadin as well as      metoprolol for heart rate control.  If she manifests progressive      palpitations that we are not able to manage with conservative      measures, I will refer her back to see Dr. Ladona [name] and discuss      antiarrhythmics or the possibility of an ablation.  2. History of coronary artery disease status post previous anterior      wall myocardial infarction with overall normal left ventricular      systolic function, mild atherosclerosis by followup angiography      last year, and no marked ischemia by Myoview earlier this year.  3. Recent presentation with chest pain resulted in no clear evidence      of acute coronary artery syndrome and normal cardiac markers.  We      will plan to continue medical therapy and I will see her back in      the next 6 months.     Kari Sidle, MD  Electronically Signed     SGM/MedQ  DD: 06/17/2008  DT: 06/18/2008  Job #: [id number]   cc:   Kari Canning. [name], MD

## 2011-02-20 NOTE — Discharge Summary (Signed)
NAMEIASIA, [name]                ACCOUNT NO.:  [id number]   MEDICAL RECORD NO.:  [id number]          PATIENT TYPE:  INP   LOCATION:  3703                         FACILITY:  [facility]   PHYSICIAN:  Thomas C. Wall, MD, FACCDATE OF BIRTH:  06-10-1951   DATE OF ADMISSION:  12/20/2007  DATE OF DISCHARGE:  12/20/2007                               DISCHARGE SUMMARY   PROCEDURES:  None.   PRIMARY FINAL DISCHARGE:  Paroxysmal atrial fibrillation with  palpitations.   SECONDARY DIAGNOSES:  1. Status post cardiac catheterization in June of 2008 with left main      and left anterior descending no significant disease, circumflex      30%, right coronary artery 30% and preserved ejection fraction.  2. History of myocardial infarction in 1998 treated at [facility] with      lytic therapy.  3. Chronic anticoagulation with Coumadin.  4. Ongoing tobacco use.  5. Hypertension.  6. Chronic obstructive pulmonary disease.  7. Family history of coronary artery disease  8. Status post hysterectomy and right oophorectomy.   TIME OF DISCHARGE:  Thirty-four 34 minutes.   HOSPITAL COURSE:  [name] is a 60 year old female with a history of  paroxysmal atrial fibrillation on Coumadin and MI.  She reported  tachypalpitations.  EMS was called, and she was significantly  hypertensive, although no further details are available.  She also  complained of chest pain and was given sublingual nitroglycerin and  transported to the emergency room.  In the emergency room, cardiac  markers were negative, and vital signs were within normal limits.  Her  EKG on arrival showed sinus rhythm.  She was admitted overnight.   Her cardiac enzymes remained negative serially.  Her symptoms resolved.  Her telemetry showed only sinus rhythm.  Her INR was therapeutic at 2.8.  Her white count was slightly elevated at 14,000, but she was afebrile  and had no dysuria.  A chest x-ray showed no active disease.   On December 21, 2007, she  was evaluated by Dr. Daleen Squibb.  He felt that since  her cardiac enzymes remained negative and her symptoms had resolved, she  could be safely discharged home and is to keep all outpatient follow-up  appointments.  She was strongly encouraged to quit smoking and stick to  a heart-healthy diet.   DISCHARGE INSTRUCTIONS:  Her activity level is to be as tolerated.  She  is to stick to a low-sodium heart-healthy diet and not use tobacco.  She  is to follow up with Dr. Eden Emms on December 29, 2007 as scheduled and with  the [facility] Department as needed.   DISCHARGE MEDICATIONS:  1. Metoprolol 50 mg 1-1/2 tablets b.i.d.  2. Lisinopril 20 mg a day.  3. Aspirin 81 mg 2 tablets daily.  4. Clonidine 0.1 mg b.i.d.  5. Coumadin 5 mg 1 tablet daily except 1-1/2 tablets on Tuesday and      Thursday.  6. HCTZ 25 mg a day.  7. Potassium 20 mEq a day.  8. Sublingual nitroglycerin p.r.n.      Theodore Demark, PA-C  Thomas C. Daleen Squibb, MD, [facility]  Electronically Signed    RB/MEDQ  D:  12/21/2007  T:  12/21/2007  Job:  [id number]   cc:   [facility]

## 2011-02-20 NOTE — Assessment & Plan Note (Signed)
[facility]                            CARDIOLOGY OFFICE NOTE   [name], [name]                       MRN:          [id number]  DATE:12/24/2007                            DOB:          [birth year]    [name] was recently hospitalized and cardiac catheterization was  done.  She had preserved LV function.  I do not know the exact  ejection fraction.  She had only minimal coronary disease.  There is  history of an MI in 1998 treated in [facility] with lytic therapy.   The patient is wearing an CardioNet recorder.  There are strips that I  am seeing at this time that shows what appears to be one burst of  supraventricular tachycardia.  There is another episode with a wider  complex tachycardia that may be ventricular tachycardia.  There are  approximately 8 beats.  The rate is in the range of 180.  The patient  did not have any symptoms documented related to this episode.  She had  had some feeling of lightheadedness during the 24-hour period and had  sinus rhythm with sinus tachycardia at that time.   We are in touch with the patient as I am reviewing these strips.  It is  possible that this could be ventricular tachycardia.  Because she has no  high-grade coronary disease and because her LV function is reported as  normal, and because she is already on a beta-blocker, I feel that it is  safe for her to be at home today and to be seen in the office tomorrow.  It is possible with this rhythm that she may need an electrophysiology  study with documented coronary disease.  However, I believe that this is  stable and she will be seen in the office tomorrow in [place].     Luis Abed, MD, [facility]  Electronically Signed    JDK/MedQ  DD: 12/24/2007  DT: 12/24/2007  Job #: [id number]

## 2011-02-20 NOTE — Assessment & Plan Note (Signed)
[place] [facility]                         ELECTROPHYSIOLOGY OFFICE NOTE   Kari, [name]                       MRN:          [id number]  DATE:09/15/2008                            DOB:          1951-09-24    [name] returns today for followup.  She is a very pleasant middle-aged  woman who I saw last year and half ago with palpitations and documented  atrial fibrillation.  She also has hypertension and ongoing tobacco use.  She is on Coumadin therapy.  She returns today for followup.  Dr.  Diona Browner saw her back a month ago and she was having increasingly  frequent episodes of palpitations and atrial fibrillation.  The patient  states that in the last week her palpitations have improvement, but in  general she is very symptomatic from these.  She has never had any frank  syncope from them, but continues to be bothered by her spells.  No other  complaints to speak of today.  She does continue to work on trying to  stop smoking.   Current medications include:  1. Warfarin as directed.  2. Lisinopril 20 a day.  3. Potassium 20 a day.  4. Hydrochlorothiazide 25 a day.  5. Metoprolol 50 mg 3 times a day.  6. Amlodipine 5 mg daily.   On physical exam, she is a pleasant well-appearing middle-aged woman no  distress.  Blood pressure today was 123/65, the pulse 60 and regular,  the respirations were 18, and the weight was 193 pounds.  Neck revealed  no jugular venous distention.  Lungs clear bilaterally to auscultation.  No wheezes, rales, or rhonchi are present.  There is no increased work  of breathing.  The cardiovascular exam revealed a regular rate and  rhythm.  Normal S1 and S2.  The abdominal exam was soft and nontender.  Extremities demonstrated no edema.   IMPRESSION:  1. Paroxysmal atrial fibrillation.  2. Hypertension.   DISCUSSION:  The patient's symptoms of AFib have increased in frequency  and severity.  I have recommend we start  low-dose flecainide 50 mg twice  daily.  We will see her back in approximately 2 months.  If she has  continuation of her symptoms, that is if flecainide at low dose does not  help then we will plan to increase to 100 twice a day and have her  undergo exercise treadmill testing to rule out proarrhythmic.     [name]. [name], MD  Electronically Signed    GWT/MedQ  DD: 09/15/2008  DT: 09/16/2008  Job #: [id number]

## 2011-02-20 NOTE — Discharge Summary (Signed)
NAMEPURVI, [name]                ACCOUNT NO.:  [id number]   MEDICAL RECORD NO.:  [id number]          PATIENT TYPE:  OBV   LOCATION:  IC06                          FACILITY:  [facility]   PHYSICIAN:  [name], MD     DATE OF BIRTH:  Dec 12, 1950   DATE OF ADMISSION:  06/08/2008  DATE OF DISCHARGE:  09/01/2009LH                               DISCHARGE SUMMARY   Please review my H&P dictated earlier today for details regarding the  patient's presenting illness.   DISCHARGE DIAGNOSES:  1. Chest pain, likely secondary to noncardiac issue, possible      gastroesophageal reflux disease.  2. History of myocardial infarction in the past.  3. History of emphysema.  4. Hypertension.   BRIEF HOSPITAL COURSE:  Briefly, this is a 60 year old Caucasian female  who presented with chest pressure-like sensation for the past 1 day.  The patient has been having these symptoms on and off for many weeks  together.  She has had extensive cardiac testing on the last year and  half including a negative or a low risk of headache cardiac cath 1 year  ago, as well as a low risk stress test earlier this year in March.  The  patient did not have any acute EKG findings.  Her D-dimer was normal.  Her chest x-ray was unremarkable.  The patient was observed in the  hospital overnight.  She was seen by [facility] in the morning,  and she was cleared for discharge.  Her cardiac enzymes were negative.  All of her cardiac markers are negative.  She was sent home.   She also has a history of paroxysmal AFib for which she is on Coumadin.  She was asked to not take a Coumadin tonight because of an elevated INR  and was asked to resume her usual dose from tomorrow.   It is felt that her chest pain could be from acid reflux disease, so she  was prescribed omeprazole.  It is also felt that emphysema could be  playing a role, so she was written a prescription for albuterol inhaler  to be used as needed.  The  patient was having some wheezing.  She was  also told to quit smoking.  Cardiology also felt that some of the  patient's symptoms could be from vertigo, so they prescribed her  meclizine.   So, on the day of discharge, the patient is feeling better except for  some wheezing for which we gave a nebulizer treatment.  Her vital signs  otherwise remained stable.  Chest pressure has resolved with 1 dose of  nitroglycerin earlier yesterday.  So, the patient was stable for  discharge.   DISCHARGE MEDICATIONS:  1. Albuterol MDI 2 puffs every 4-6 hours as needed for wheezing or      cough.  2. Omeprazole 20 mg once daily for a month.  3. I have told her to discuss her use of aspirin with her cardiologist      before continuing it.  Otherwise, she may continue Coumadin 5 mg      from tomorrow.  She is also on 7.5 mg alternating with 5 mg.  4. Metoprolol 75 mg b.i.d.  5. Klor-Con 20 mEq daily.  6. Amlodipine 5 mg daily.  7. Lisinopril 20 mg daily.  8. Hydrochlorothiazide 25 mg daily.  9. She is on a statin study on by [facility].   Followup with Charlton Haws as scheduled sometime this month.  Diet as  before.  Physical activity as before.   The patient was counseled on smoking cessation.   She was also hypokalemic, which was corrected by p.o. potassium.   Total time on this discharge encounter less than 30 minutes.      [name], MD  Electronically Signed     GK/MEDQ  D:  06/08/2008  T:  06/09/2008  Job:  [id number]   cc:   [name], MD, [facility]  [address]  [address]  [place]  [place] [address]

## 2011-02-20 NOTE — Assessment & Plan Note (Signed)
[facility]                       [facility] CARDIOLOGY OFFICE NOTE   [name]                       MRN:          [id number]  DATE:12/25/2007                            DOB:          [birth year]    [name] is a pleasant patient of Dr. Fabio Bering who is 60 years old and  has a history of atrial fibrillation as well as myocardial infarction.  Her cardiac history dates back to approximately ten years ago. She  apparently presented with a myocardial infarction in [place] in  December 1998 and was treated with lytic therapy. Left heart  catheterization showed no critical stenosis. She had a heart  catheterization last performed on April 07, 2007. At that time she was  having chest pain and shortness of breath. She was found to have no  obstructive coronary disease, and her LV function was normal. Her most  recent echocardiogram was performed on November 19, 2006. At that time  she was found to have normal LV function. She was recently admitted to  [facility] with complaints of palpitations. She was  hypertensive when EMS arrived and ultimately was admitted. Her cardiac  markers apparently were negative. She was discharged and outpatient  cardiac monitor was arranged. Note at the time of her admission she  states that she was eating and felt a warm sensation all over with mild  dizziness and then her heart was fluttering. She has had minimal  fluttering since then. She does not have exertional chest pain or  shortness of breath. There is no pedal edema. She does state that her  chest feels heavy at times, but this improves with activities. On her  CardioNet monitor, it was noted that she was having episodes of wide  complex tachycardia. One episode was nine beats, and she also had a run  of PAT. Because of the wide complex tachycardia, Dr. Myrtis Ser added her onto  the schedule today.   MEDICATIONS:  1. Coumadin as directed.  2. Lisinopril  20 mg p.o. every day.  3. Potassium 20 mEq p.o. every day.  4. Hydrochlorothiazide 25 mg p.o. every day.  5. Clonidine 0.1 mg p.o. b.i.d.  6. Lopressor 75 mg p.o. b.i.d.  7. Aspirin 81 mg p.o. every day.   PHYSICAL EXAMINATION:  VITAL SIGNS:  Blood pressure 120/80, and her  pulse is 60. She weighs 186 pounds.  HEENT:  Normal.  NECK:  Supple.  CHEST:  Clear.  CARDIOVASCULAR:  Regular rate.  ABDOMINAL:  No tenderness.  EXTREMITIES:  No edema.   Her electrocardiogram today shows a sinus rhythm at a rate of 60. A  prior septal infarct cannot be excluded. There are no ST changes noted.   DIAGNOSES:  1. Wide complex tachycardia. I did review the strips with Dr. [name].      It appears to be possibly nonsustained ventricular tachycardia.      However, I have also reviewed the patient's previous monitor from      August. She also had brief nonsustained ventricular tachycardia at      that time. Her left ventricular function is  normal, and her      catheterization back in the summer showed no obstructive coronary      disease. We will continue with her beta-blocker for now, and I will      also check electrolytes including a magnesium and potassium. We      could consider increasing her Lopressor in the future, but at      present her baseline heart rate is 60.  2. Atypical chest pain. The patient is having mild chest heaviness.      However, she states this is nothing like her previous infarct pain.      It also improves with exertion. She feels that it may be stress      related. Note she does not have exertional chest pain. I will      schedule her to have a Myoview to reassess her left ventricular      function and also to exclude ischemia. We will then have her follow      up with Dr. Eden Emms in 2-4 weeks to review her symptoms and her      above studies.  3. History of atrial fibrillation. She will continue on her Lopressor      as well as her Coumadin.  4. Hypertension. Her blood  pressure is adequately controlled on her      present medications.     [name], MD, [facility]  Electronically Signed    BSC/MedQ  DD: 12/25/2007  DT: 12/25/2007  Job #: [id number]

## 2011-02-23 NOTE — Discharge Summary (Signed)
[name], [name]                ACCOUNT NO.:  [id number]   MEDICAL RECORD NO.:  [id number]          PATIENT TYPE:  INP   LOCATION:  3707                         FACILITY:  [facility]   PHYSICIAN:  Isidor Holts, M.D.  DATE OF BIRTH:  November 10, 1950   DATE OF ADMISSION:  11/18/2006  DATE OF DISCHARGE:  11/21/2006                               DISCHARGE SUMMARY   PRIMARY M.D.:  Dr. Consuello Masse, [place], [place].   DISCHARGE DIAGNOSES:  1. Paroxysmal atrial fibrillation.  2. Anticoagulation treatment.  3. Hypertension.  4. Smoking history.  5. COPD.  6. History of coronary artery disease.   DISCHARGE MEDICATIONS:  1. Metoprolol 25 mg p.o. b.i.d.  2. Coumadin per INR. Currently on 5 mg p.o. q.6 p.m.  3. Enteric-coated Aspirin 325 mg p.o. daily.  4. Hydrochlorothiazide 25 mg p.o. daily.  5. K-Dur 20 mEq p.o. daily.  6. Lisinopril 20 mg p.o. daily.  7. Clonidine 0.1 mg p.o. b.i.d.  8. Nicoderm CQ (21 mg/24 hours) one patch to the skin daily.   PROCEDURES:  1. Portable chest x-ray dated November 19, 2006. This showed no active      cardiopulmonary disease.  2. 2D echocardiogram dated November 19, 2006. This showed left      ventricular size at upper limits of normal. LV function was normal.      EF 55%. Findings suggestive of hypokinesis of the mid distal septal      wall. LV thickness was moderately increased. Doppler parameters      were consistent with high left ventricular filling pressure. There      was trivial aortic valvular regurgitation. Left atrium was mildly      dilated.   CONSULTATIONS:  Dr. [name], [facility] cardiology.   For admission History, see H&P notes of November 19, 2006 dictated by  Dr. Elliot Cousin. However, in brief this is a 60 year old female, with  known history of hypertension, COPD, coronary artery disease status post  previous MI, smoking history, who presents with chest pressure and  palpitations which commenced when she got up from the  couch to go to the  kitchen. She sat back down. Symptoms abated somewhat. She called her  sister-in-law who called EMS, who arrived, found her to have tachycardia  of 180 per minute which was successfully cardioverted with intravenous  adenosine. By the time she arrived in the emergency department she was  back in normal sinus rhythm with heart rate 68 per minute. She was  admitted for evaluation, investigation, and management.   CLINICAL COURSE:  1. Paroxysmal atrial fibrillation.  For details of presentation, refer      to above admission history. Patient had no further recurrences of      supraventricular tachyarrhythmia throughout the course of her      hospitalization. Review of 12-lead EKG demonstrated a somewhat      short PR interval necessitating calling cardiology consultation,      which was kindly provided by Dr. [name], [facility], who      has reviewed both admission telemetric rhythm strip as well as 12-  lead EKG, and concluded that patient did indeed have paroxysmal      atrial fibrillation and no evidence of WPW syndrome. Continued beta      blocker treatment and anticoagulation, was recommended. A stress      Myoview is recommended on an outpatient basis. As of November 21, 2006, the patient has remained in sinus rhythm, has had no      recurrence of chest pain and was not clinically in heart failure.   1. Smoking history.  The patient has been counseled appropriately, and      commenced on Nicoderm CQ patch.   1. Hypertension.  At time of presentation, patient was found to be      hypertensive with a blood pressure of 171/83 mmHg. BP has been      addressed with a combination of beta blocker, HCTZ, ACE inhibitor,      and Clonidine. We expect the patient's primary MD to continue to      follow up her blood pressure, and titrate medications as indicated.   1. History of COPD. There were no symptoms referable to this, during      the course of  patient's hospitalization.   DISPOSITION:  The patient was considered sufficiently clinically  recovered and stable to be discharged on November 21, 2006.   DIET:  Healthy heart diet.   ACTIVITY:  As tolerated.   WOUND CARE:  Not applicable.   FOLLOWUP:  The patient is instructed to follow up with Dr. [name],  [facility], on January 03, 2007 at 10:45 a.m. She is to follow up  with her primary M.D., Dr. Consuello Masse, in [place], [place],  within two weeks of discharge. In addition the following followup  arrangements have been made for the patient, i.e., Coumadin clinic at  the [facility] office on Tuesday, February 19, at 9:30 a.m., stress  Myoview on Thursday, February 21, at 1:30 a.m. at the [place] office.  She has been instructed to have nothing to eat after midnight on  November 27, 2006, although she may have clear liquids until 7 a.m.  November 28, 2006. All this have been communicated to the patient. She  verbalized understanding.      Isidor Holts, M.D.  Electronically Signed     CO/MEDQ  D:  11/21/2006  T:  11/21/2006  Job:  [id number]   cc:   Mora Bellman, M.D.  [name]. [name], MD

## 2011-02-23 NOTE — H&P (Signed)
[name], [name]                ACCOUNT NO.:  [id number]   MEDICAL RECORD NO.:  [id number]          PATIENT TYPE:  EMS   LOCATION:  MAJO                         FACILITY:  [facility]   PHYSICIAN:  Elliot Cousin, M.D.    DATE OF BIRTH:  1950/10/22   DATE OF ADMISSION:  11/19/2006  DATE OF DISCHARGE:                              HISTORY & PHYSICAL   PRIMARY CARE PHYSICIAN:  Dr. Shelah Lewandowsky, [place].   CHIEF COMPLAINT:  Chest pain and palpitations.   HISTORY OF PRESENT ILLNESS:  The patient is a 60 year old woman with a  past medical history significant for a myocardial infarction  approximately 10 years ago, hypertension, and COPD, who presents to the  emergency department with a chief complaint of chest pain and  palpitations.  Her symptoms started last night.  She was sitting on the  couch and got up to go to the kitchen.  On the way back, she felt  lightheaded, as if she was going to pass out.  She felt a sudden onset  of chest pain in the central chest area and associated shortness of  breath.  The chest pain was described as  pressure-like pain and as if  someone was sitting on my chest.  The patient sat back down and the  symptoms abated a little.  However, she called her sister-in-law, who  then called EMS.  When EMS arrived, the telemetry monitor revealed that  she had a heart rate of 180 beats per minute and supraventricular  tachycardia.  The patient was subsequently given 6 mg followed by 12 mg  of adenosine.  When the patient arrived to the emergency department, an  EKG was performed and revealed normal sinus rhythm with a heart rate of  63 beats per minute.  She was given 1 sublingual nitroglycerin and 5 mg  of IV metoprolol.  Currently, the patient is chest-pain-free and has no  complaints, other than being a little frightened.   In her review of systems, the patient admits to intermittent  palpitations throughout the last 2-3 years.  She has also had  accompanying chest pain that was always transient.  The patient drinks  two 8-ounce cups of coffee daily.  She smokes not quite a pack of  cigarettes per day.  With her symptoms above, she did have some  transient nausea and shortness of breath, but no radiation of the pain.   PAST MEDICAL HISTORY:  1. Myocardial infarction, status post thrombolytics approximately 10      years ago.  2. History of chest palpitations.  3. Hypertension (not medically treated).  4. COPD with ongoing tobacco use.  5. Status post hysterectomy and right oophorectomy secondary to      ovarian cyst in the past.   MEDICATIONS:  Aspirin 325 mg daily.   ALLERGIES:  No known drug allergies.   SOCIAL HISTORY:  The patient is widowed.  [place]  She has 1 child.  She is employed as a Firefighter at [employer].  She smokes three-quarters of a  pack of  cigarettes per day and has been doing so for more than 30 years.  She  denies alcohol and illicit drug use.   FAMILY HISTORY:  Her mother died of a heart attack at 52 years of age.  Her father died at 48 years of age secondary to complications of spinal  surgery.  She has 1 brother who is relatively healthy and another  brother who has emphysema and coronary artery disease.   REVIEW OF SYSTEMS:  As above in the history of present illness.   PHYSICAL EXAMINATION:  VITAL SIGNS:  Temperature 98.3, blood pressure  171/83, pulse 74, respiratory rate 21, oxygen saturation 99% on 2 L of  nasal cannula oxygen.  GENERAL:  The patient is a pleasant, overweight 60 year old Caucasian  woman who is currently lying in bed in no acute distress.  HEENT:  Head is normocephalic and nontraumatic.  Pupils are equal, round  and reactive to light.  Extraocular movements are intact.  Conjunctivae  are clear.  Sclerae are white.  Nasal mucosa is moist.  No sinus  tenderness.  Oropharynx reveals upper dentures.  Mucous membranes are   mildly dry.  No posterior exudates or erythema.  NECK:  Supple.  No adenopathy, no thyromegaly, no bruit, no JVD.  LUNGS:  Rare wheezes in the upper lobes and decreased breath sounds in  the bases.  Breathing is nonlabored.  HEART:  S1 and S2 with an ectopic beat.  ABDOMEN:  Obese.  Positive bowel sounds.  Soft, nontender and non-  distended.  No hepatosplenomegaly.  No masses palpated.  EXTREMITIES:  She has several varicosities of her lower extremities,  particularly around her ankles bilaterally.  No pretibial edema and no  pedal edema.  Pedal pulses are 2+ bilaterally.  NEUROLOGIC:  The patient is alert and oriented x3.  Cranial nerves II-  XII are intact.  Strength is 5/5 throughout.  Sensation is intact.   ADMISSION LABORATORY DATA:  Chest x-ray reveals no active  cardiopulmonary disease.   EKG reveals normal sinus rhythm with heart rate of 63 beats per minute  and nonspecific ST-T wave abnormalities in the inferior and lateral  leads.   WBC 8.9, hemoglobin 14, platelets 176,000.  CK-MB 3.4, troponin I less  than 0.05, myoglobin 87.7.  Sodium 143, potassium 3.4, chloride 105, CO2  25, glucose 152, BUN 8, creatinine 0.67, total bilirubin 0.4, alkaline  phosphatase 77, SGOT 28, SGPT 28, total protein 5.8, albumin 3.5,  calcium 8.8.   ASSESSMENT:  1. Supraventricular tachycardia.  As indicated above, the patient's      heart rate was 180 beats per minute per the EKG obtained by      emergency medical technician prior to the patient's arrival to the      hospital.  She is status post a total of 18 mg of adenosine.  She      is currently in normal sinus rhythm and she is hemodynamically      stable.  2. Chest pain.  The chest pain may be simply a consequence of      supraventricular tachycardia.  However, given her history of      coronary artery disease, myocardial infarction will need to be      ruled out. 3. Hypertension.  The patient's blood pressure is 171/83.  She says       that she has not been treated with an antihypertensive medication      in years.  The patient admits  that she has not seen her primary      care physician in over 1 year.  4. Hypokalemia.  The patient's serum potassium is 3.4.  Magnesium      deficiency will need to be ruled out.  5. Chronic obstructive pulmonary disease with ongoing tobacco abuse.      The patient has rare bronchospasms on exam.  Her breathing is      nonlabored.  6. Hyperglycemia.  The patient's venous glucose is 152.  She gives no      history of diabetes mellitus.  The patient may have been given      dextrose by the emergency medical technician en route to the      emergency department.   PLAN:  1. The patient will be admitted for further evaluation and management.  2. We will check cardiac enzymes q.8 h. x3 to evaluate for myocardial      infarction.  3. We will check a TSH to rule out thyroid disease.  We will also      check a 2-D echocardiogram to evaluate for valvular abnormalities,      etc.  4. We will check a magnesium level to rule out magnesium deficiency.      We will replete potassium chloride in the IV fluids and orally.  5. We will consult Cardiology if needed.  6. We will add Toprol-XL 12.5 mg b.i.d.  We will discontinue Toprol if      the patient begins to have more bronchospasms.  7. We will place a nicotine patch.  Tobacco cessation counseling.  8. We will treat the patient's pain with as-needed nitroglycerin and      as-needed morphine.  9. We will add prophylactic Lovenox for now.  10.Obviously, if the patient rules in for a myocardial infarction, she      will be placed on standing-dose nitroglycerin and full-dose      Lovenox.      Elliot Cousin, M.D.  Electronically Signed     DF/MEDQ  D:  11/19/2006  T:  11/19/2006  Job:  [id number]

## 2011-02-23 NOTE — Assessment & Plan Note (Signed)
[facility]                         ELECTROPHYSIOLOGY OFFICE NOTE   [name], [name]                         MRN:          [id number]  DATE:12/04/2006                            DOB:          Dec 10, 1950    [name] returns today for followup. She is a very pleasant middle-aged  woman with coronary disease status post anterior MI many years ago with  mild LV dysfunction. She has a history of atrial fibrillation, tobacco  abuse, COPD, obesity and diabetes. The patient was in the emergency room  approximately two weeks ago with atrial fibrillation and rapid  ventricular response and return to sinus rhythm spontaneously. At that  time, a 2-D echocardiogram demonstrated preserved LV function with  trivial AI and moderate LV thickness. The patient has had no significant  tachy palpitations since discharge from the hospital. She notes that her  main complaint today is that of heaviness in her left and right arm. She  notes that several days ago she did some extensive work around her house  with her arms and since then has felt the heaviness. She denies chest  pain. She denies worsening shortness of breath.   On physical exam, she is a pleasant, obese, middle-aged woman in no  acute distress. The blood pressure was 155/87. The pulse was 67 and  regular. The respirations were 18. The weight was 178 pounds.  The neck revealed no jugular venous distention.  LUNGS:  Were clear bilaterally to auscultation.  CARDIOVASCULAR EXAM:  With a regular rate and rhythm with normal S1 and  S2.  The extremities demonstrated no edema.   Her medications include:  1. Warfarin as directed.  2. Lisinopril 20 a day.  3. Metoprolol 50 half tablet twice daily.  4. Potassium.  5. Hydrochlorothiazide.  6. Clonidine.   IMPRESSION:  1. Paroxysmal atrial fibrillation.  2. Hypertension.  3. Chronic obstructive pulmonary disease.  4. Tobacco abuse.  5. Chronic Coumadin  therapy.   DISCUSSION:  I have discussed treatment options with the patient. I have  recommended that we continue her on her beta blocker therapy and her  Coumadin. If she has breakthrough of atrial fibrillation, I have  recommended that she increase her metoprolol, taking a whole extra  tablet daily. She will continue her other medications. We also talked  about the possibility of antiarrhythmic drug therapy for this patient.  Because of her known coronary disease, sotalol or Tikosyn will be the  only other options besides the amiodarone, and I have offered her  Tikosyn if  her palpitations increase in frequency or severity. We will plan to see  her back in several  months. Of note, the patient did have a stress test on the 21th of  February demonstrating prior small anterior scar with very minimal peri-  infarct ischemia.     [name]. [name], MD  Electronically Signed    GWT/MedQ  DD: 12/04/2006  DT: 12/04/2006  Job #: [id number]

## 2011-02-23 NOTE — H&P (Signed)
NAMEVERNIE, [name]                ACCOUNT NO.:  [id number]   MEDICAL RECORD NO.:  [id number]          PATIENT TYPE:  INP   LOCATION:  3707                         FACILITY:  [facility]   PHYSICIAN:  [name]. [name], MD    DATE OF BIRTH:  Sep 26, 1951   DATE OF ADMISSION:  11/19/2006  DATE OF DISCHARGE:  11/21/2006                              HISTORY & PHYSICAL   PRIMARY CARE GIVER:  Dr. Roxan Hockey in [place].   ELECTROPHYSIOLOGY CONSULT:  Dr. Lewayne Bunting.   ALLERGIES:  The patient has no known drug allergies.   HISTORY OF PRESENT ILLNESS:  [name] is a 60 year old female who had a  myocardial infarction at age 27.  She had thrombus which responded to  living therapy.  Subsequent catheterization then showed arteries without  residual stenosis.  She was discharged on aspirin and Plavix and took  the Plavix until her prescription ran out.  Her past medical history  also includes hypertension, COPD, ongoing tobacco and family history of  coronary artery disease.   The patient has been aware of racing events in her chest for the last 3-  4 weeks.  She always feel them when she is in bed.  Never when she is up  exercising walking around.  They last a couple of hours.  The patient  when she felt these coming on would relax, fall asleep and then she  would be fine in the morning.   On the evening, February 11, the patient had gotten a drink from her  kitchen and brought it into the living room.  As she sat down she felt  as if she had gotten kicked by a [place].  She felt her heart racing.  She actually saw her shirt vibrating.  She was extremely lightheaded.  She had an increased work of breathing.  She had chest heaviness with no  diaphoresis.  She said, I thought I was going to die.  Managed to call  911.  When they arrived striped showed a rapid rate with heart rates in  the 160-170 range per minute.  She was given 6 mg of IV adenosine and 12  mg.  This was done at home even before  placing her in the ambulance.  The strips actually showed atrial fibrillation with rapid ventricular  rate during adenosine challenge, AV block did not show flutter wings.  At some time during transport to [facility] the patient lapsed  into sinus rhythm and at the time she came to the emergency room she was  feeling much better.  At the current time she does continue to have  hypertension.  She is on metoprolol 25 mg b.i.d.  She had an  echocardiogram November 19, 2001 showing an ejection fraction of 55%  with hypokinesis in the mid to distal septal wall.   MEDICATIONS:  1. Aspirin 325 mg daily.  2. She is on Lovenox 40 mg every 24 hours.  3. Protonix 40 mg daily.  4. Nicoderm transdermal patch 21 mg for 24 hours.  5. Potassium chloride 20 mEq a day.  6.  Metoprolol 25 mg b.i.d.  7. Hydrochlorothiazide 25 mg daily.  8. ACE, lisinopril 20 mg daily.   SOCIAL HISTORY:  Patient lives in [place].  She is a widow.  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  She does smoke almost 1-pack-per-  day and has done so for 30 years.  She does not partake of alcoholic  beverages or recreational drugs.   FAMILY HISTORY:  Mother died of myocardial infarction at age 30.  Her  father died of complications of spinal surgery at age 72.  She has 1  brother who is healthy and 1 brother with emphysema and history of  coronary artery disease.   REVIEW OF SYSTEMS:  The patient is not experiencing fevers, chills,  night sweats or weight change in the last 6 months.  HEENT:  No  hepatosis.  No hoariness.  No vertigo.  No photophobia.  INTEGUMENT:  No  rashes or unhealing ulcerations to the lower extremities.  CARDIOPULMONARY:  Patient had chest heaviness not particularly chest  pain.  Patient describes chest pain with her myocardial infarction as  being across the shoulders and radiating to the arms and also having a  profuse diaphoresis.  This time she describes the experience with the   palpitation as a heaviness and a difficulty getting a deep breath.  She  is not particularly orthopneic.  Nor does she has paroxysmal nocturnal  dyspnea.  She is not complaining of peripheral edema and she has never  had an episode of syncope although she felt very presyncopal at the time  of the onset of palpitations Monday evening February 11.  UROGENITAL:  She did not having any dysuria, hematuria or nocturia.  NEUROLOGIC:  No  weakness, numbness, depression or anxiety.  GASTROINTESTINAL:  No  reflux.  No history of GI bleeding.  ENDOCRINE:  No history of thyroid  problems or diabetes.  In fact her thyroid simulating hormone this  admission is 1.273.  Her hemoglobin A1c is 5.9.  She is not complaining  of arthralgias or effusions in the joints.  All other systems are  negative.   PHYSICAL EXAMINATION:  GENERAL:  This is an alert, oriented x3 female in  no acute distress.  Mildly obese.  Temperature 98.5.  Pulse is 70 and  regular.  Blood pressure is 185/94.  Respirations rate 20 and oxygen  saturation 96% on room air.  Her systolic blood pressures have run like  this [id number].  HEENT:  Normocephalic, atraumatic.  Eyes pupils equal round and reactive  to light.  Extraocular movements intact.  Sclera are clear.  NECK:  Is  supple without carotid bruits.  No thyromegaly.  No jugular venous  distention.  HEART:  Regular rate and rhythm without murmur.  LUNGS:  Clear to auscultation bilaterally.  ABDOMEN:  Soft, mildly obese.  No guarding or rebound.  No  hepatosplenomegaly.  The abdominal aorta is not palpated.  EXTREMITIES:  Show no evidence of clubbing, cyanosis or edema.  She has  dorsalis pedis pulses 4/4 bilaterally.  Radial pulses are 4/4  bilaterally.  MUSCULOSKELETAL:  Showed no joint deformity or effusions.  NEUROLOGIC:  Cranial nerves II-XII are grossly intact.  No neurologic deficits are evidenced.  Telemetry strips taken by EMS show atrial  fibrillation, rapid  ventricular rate.  Electrocardiogram November 19, 2006 shows sinus rhythm.  Her PR-interval is short, 120 milliseconds but  there is no preexcitation.  There is evidence of left ventricular  hypertrophy.  LABORATORY STUDIES:  The troponin studies were 0.06, then 0.05, 0.04.  PT was 14.2.  INR 1.1.  PTT was 35.  White cells 8.9.  Hemoglobin 14.  Hematocrit 40.3 and platelets are 176.  Serum electrolytes:  Sodium 137.  Potassium 3.9.  Chloride 106.  Carbonate 27.  BUN 9.  Creatinine 0.73  and fasting glucose 91.  Alkaline phosphatase is 77.  SGOT is 28.  SGPT  is 28.  The BNP is 230.  TSH 1.273.  HgbA1c 5.9.   IMPRESSION:  1. Admitted with palpitations/dizziness/dyspnea.      a.     Atrial fibrillation rapid ventricular rate.      b.     History of palpitations for the last 3-4 weeks.  2. Hypertension requiring 3 antihypertensives so far.  3. History of myocardial infarction.  She underwent lytic therapy for      a thrombus and was placed on Plavix and aspirin.  A follow up      catheterization showed no evidence of flow limiting stenosis.  4. Echocardiogram November 19, 2006.  Ejection fraction 55%.  5. Ongoing tobacco/chronic obstructive pulmonary disease.  Patient      says that she will quit.  6. Status post hysterectomy and right oophorectomy.   PLAN:  As formulated by Dr. [name] is that the patient will stay on  metoprolol 25 mg twice daily.  She will stay on aspirin 325 mg daily.  She will start Coumadin 5 mg daily.  She will have an outpatient Myoview  study at [facility].  Dr. [name] mentioned that she could be  discharged without a therapeutic INR.  Her duration of atrial  fibrillation was less than 24 hours.  She will also see Dr. [name] in  the outpatient clinic after the stress Myoview.      Maple Mirza, PA      [name]. [name], MD  Electronically Signed    GM/MEDQ  D:  11/20/2006  T:  11/21/2006  Job:  [id number]

## 2011-02-26 ENCOUNTER — Ambulatory Visit (INDEPENDENT_AMBULATORY_CARE_PROVIDER_SITE_OTHER): Payer: Medicare Other | Admitting: Adult Health

## 2011-02-26 ENCOUNTER — Encounter: Payer: Self-pay | Admitting: Adult Health

## 2011-02-26 DIAGNOSIS — I2510 Atherosclerotic heart disease of native coronary artery without angina pectoris: Secondary | ICD-10-CM

## 2011-02-26 DIAGNOSIS — I10 Essential (primary) hypertension: Secondary | ICD-10-CM

## 2011-02-26 DIAGNOSIS — I4891 Unspecified atrial fibrillation: Secondary | ICD-10-CM

## 2011-02-26 DIAGNOSIS — I495 Sick sinus syndrome: Secondary | ICD-10-CM

## 2011-02-26 DIAGNOSIS — J449 Chronic obstructive pulmonary disease, unspecified: Secondary | ICD-10-CM

## 2011-02-26 NOTE — Patient Instructions (Signed)
**Note De-Identified  Obfuscation** Your physician has requested that you have an echocardiogram. Echocardiography is a painless test that uses sound waves to create images of your heart. It provides your doctor with information about the size and shape of your heart and how well your heart's chambers and valves are working. This procedure takes approximately one hour. There are no restrictions for this procedure.  Your physician has recommended that you have a pulmonary function test. Pulmonary Function Tests are a group of tests that measure how well air moves in and out of your lungs.  Your physician recommends that you schedule a follow-up appointment in: March 15, 2011

## 2011-02-26 NOTE — Assessment & Plan Note (Signed)
She has a diagnosis of COPD and is on inhalers.  She is having worsening symptoms subjectively. Will repeat PFT's to evaluate for worsening lung fx and possible need for pulmonary referral. She has stopped smoking as of 9 days ago, but states she is feeling worse.

## 2011-02-26 NOTE — Assessment & Plan Note (Signed)
Her symptoms do not appear to be cardiac related, but more pulmonary as she complains most of fatigue and increased work of breathing.  It has been 2 years since last ECHO 6/10 at which time she had normal EF of 60%.  Will repeat this to evaluate for changes in LV fx, increased pulmonary pressures, or changes in valvular structure.  She will follow-up with Dr. Diona Browner to discuss results.

## 2011-02-26 NOTE — Progress Notes (Signed)
HPI:  [name] is a 60 y/o CF patient of Dr. 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  She was recently seen at [facility] for recurrent chest pressure and fatigue with DOE on 5/211/2012.  She was ruled out for MI and diagnosed with atypical chest pain.  She was to follow-up with cardiology as an outpatient.  Since discharge she continues to have DOE, unable to complete chores at home without fatigue. She quit smoking 9 days ago after 1/2 ppd for over 71yrs.  She states that she feel worse concerning her breathing after stopping smoking than when she was smoking. She also complains of LEE.  No further chest pain, but pressure with shortness of breath.  No Known Allergies  Current Outpatient Prescriptions  Medication Sig Dispense Refill  . albuterol (PROVENTIL) (2.5 MG/3ML) 0.083% nebulizer solution Take 2.5 mg by nebulization every 6 (six) hours as needed.        [name] albuterol-ipratropium (COMBIVENT) 18-103 MCG/ACT inhaler Inhale 2 puffs into the lungs every 6 (six) hours as needed.        [name] amLODipine (NORVASC) 5 MG tablet Take 5 mg by mouth daily.        [name] aspirin 81 MG tablet Take 81 mg by mouth daily.        . flecainide (TAMBOCOR) 100 MG tablet Take 1 tablet (100 mg total) by mouth 2 (two) times daily.  60 tablet  5  . hydrochlorothiazide 25 MG tablet Take 25 mg by mouth daily.        [name] lisinopril (PRINIVIL,ZESTRIL) 20 MG tablet Take 20 mg by mouth daily.        . meclizine (ANTIVERT) 25 MG tablet Take 25 mg by mouth as needed.        . metoprolol (LOPRESSOR) 50 MG tablet Take 50 mg by mouth 3 (three) times daily.        . nitroGLYCERIN (NITRO-DUR) 0.2 mg/hr Place 1 patch onto the skin daily.        . nitroGLYCERIN (NITROSTAT) 0.4 MG SL tablet Place 0.4 mg under the tongue every 5 (five) minutes as  needed.        . potassium chloride SA (KLOR-CON M20) 20 MEQ tablet Take 20 mEq by mouth daily.       . rosuvastatin (CRESTOR) 40 MG tablet Take 40 mg by mouth daily.        [name] warfarin (COUMADIN) 5 MG tablet Take 5 mg by mouth daily. As directed         Past Medical History  Diagnosis Date  . Arrhythmia     atrial fibrillation  . CAD (coronary artery disease)     non-obstructive LVEF 60%  . COPD (chronic obstructive pulmonary disease)   . Hyperlipidemia   . Hypertension   . Myocardial infarction   . Tachycardia-bradycardia syndrome     Past Surgical History  Procedure Date  . Appendectomy   . Total abdominal hysterectomy   . Insert / replace / remove pacemaker 12/2009     St.Jude dual chamber pacemaker     ZOX:[id number] of systems complete and found to be negative unless listed abovePHYSICAL EXAM BP 136/75  Pulse 88  Ht 5\' 2"  (1.575 m)  Wt 193 lb (87.544 kg)  BMI 35.30 kg/m2  SpO2 95%  General: Well developed, well nourished, in no acute  distress Head: Eyes PERRLA, No xanthomas.   Normal cephalic and atramatic  Lungs:  Mild bilateral wheezes, without rales. Mildly prolonged expiratory phase Heart: HRRR S1 S2,  Pulses are 2+ & equal.            No carotid bruit. No JVD.  No abdominal bruits. No femoral bruits. Abdomen: Bowel sounds are positive, abdomen soft and non-tender without masses or                  Hernia's noted. Msk:  Back normal, normal gait. Normal strength and tone for age. Extremities: + clubbing,  No cyanosis or edema.  DP +1 Neuro: Alert and oriented X 3. Psych:  Good affect, responds appropriately EKG: Paced rhythm  ASSESSMENT AND PLAN

## 2011-02-28 ENCOUNTER — Encounter: Payer: Self-pay | Admitting: Adult Health

## 2011-02-28 ENCOUNTER — Ambulatory Visit (HOSPITAL_COMMUNITY)
Admission: RE | Admit: 2011-02-28 | Discharge: 2011-02-28 | Disposition: A | Discharge status: Home / Self Care | Payer: Medicare Other | Source: Ambulatory Visit | Attending: Cardiology | Admitting: Cardiology

## 2011-02-28 ENCOUNTER — Encounter: Payer: Self-pay | Admitting: Cardiology

## 2011-02-28 ENCOUNTER — Other Ambulatory Visit (HOSPITAL_COMMUNITY): Payer: Medicare Other

## 2011-02-28 DIAGNOSIS — R0609 Other forms of dyspnea: Secondary | ICD-10-CM | POA: Insufficient documentation

## 2011-02-28 DIAGNOSIS — R0989 Other specified symptoms and signs involving the circulatory and respiratory systems: Secondary | ICD-10-CM | POA: Insufficient documentation

## 2011-02-28 LAB — PULMONARY FUNCTION TEST

## 2011-02-28 LAB — BLOOD GAS, ARTERIAL
Acid-Base Excess: 1.5 mmol/L (ref 0.0–2.0)
Bicarbonate: 25.3 mEq/L — ABNORMAL HIGH (ref 20.0–24.0)
TCO2: 22 mmol/L (ref 0–100)
pCO2 arterial: 38.2 mmHg (ref 35.0–45.0)
pH, Arterial: 7.436 — ABNORMAL HIGH (ref 7.350–7.400)

## 2011-03-06 ENCOUNTER — Ambulatory Visit (HOSPITAL_COMMUNITY)
Admission: RE | Admit: 2011-03-06 | Discharge: 2011-03-06 | Disposition: A | Discharge status: Home / Self Care | Payer: Medicare Other | Source: Ambulatory Visit | Attending: Cardiology | Admitting: Cardiology

## 2011-03-06 DIAGNOSIS — I359 Nonrheumatic aortic valve disorder, unspecified: Secondary | ICD-10-CM

## 2011-03-06 DIAGNOSIS — I10 Essential (primary) hypertension: Secondary | ICD-10-CM | POA: Insufficient documentation

## 2011-03-06 DIAGNOSIS — I4891 Unspecified atrial fibrillation: Secondary | ICD-10-CM | POA: Insufficient documentation

## 2011-03-09 ENCOUNTER — Other Ambulatory Visit: Payer: Self-pay | Admitting: *Deleted

## 2011-03-09 MED ORDER — AMLODIPINE BESYLATE 5 MG PO TABS: 5.0000 mg | ORAL_TABLET | Freq: Every day | ORAL | Status: DC

## 2011-03-14 ENCOUNTER — Encounter: Payer: Medicare Other | Admitting: *Deleted

## 2011-03-15 ENCOUNTER — Ambulatory Visit (INDEPENDENT_AMBULATORY_CARE_PROVIDER_SITE_OTHER): Payer: Medicare Other | Admitting: *Deleted

## 2011-03-15 ENCOUNTER — Ambulatory Visit (INDEPENDENT_AMBULATORY_CARE_PROVIDER_SITE_OTHER): Payer: Medicare Other | Admitting: Cardiology

## 2011-03-15 ENCOUNTER — Encounter: Payer: Self-pay | Admitting: Cardiology

## 2011-03-15 VITALS — BP 132/71 | HR 88 | Wt 194.0 lb

## 2011-03-15 DIAGNOSIS — I4891 Unspecified atrial fibrillation: Secondary | ICD-10-CM

## 2011-03-15 DIAGNOSIS — F17200 Nicotine dependence, unspecified, uncomplicated: Secondary | ICD-10-CM

## 2011-03-15 DIAGNOSIS — I495 Sick sinus syndrome: Secondary | ICD-10-CM

## 2011-03-15 DIAGNOSIS — I2510 Atherosclerotic heart disease of native coronary artery without angina pectoris: Secondary | ICD-10-CM

## 2011-03-15 DIAGNOSIS — Z7901 Long term (current) use of anticoagulants: Secondary | ICD-10-CM

## 2011-03-15 DIAGNOSIS — J449 Chronic obstructive pulmonary disease, unspecified: Secondary | ICD-10-CM

## 2011-03-15 LAB — POCT INR: INR: 3.7

## 2011-03-15 MED ORDER — NITROGLYCERIN 0.2 MG/HR TD PT24: 1.0000 | MEDICATED_PATCH | Freq: Every day | TRANSDERMAL | Status: DC

## 2011-03-15 NOTE — Progress Notes (Signed)
Clinical Summary [name] is a 60 y.o.female presenting for followup. She was seen most recently by Dr. [name], then Ms. Lawrence. She was referred for followup testing, noted below.  She has evidence of significant COPD with bronchodilator improvement. She has not however had formal Pulmonary consultation and management over the years. She continues to struggle with complete smoking cessation. She has cut back significantly, and goes periods of time without smoking any cigarettes.  Today we reviewed the results of her echocardiogram which were overall reassuring, also the pulmonary function tests.  She denies any significant palpitations, has had good rhythm control on the medicines outlined below. No reported bleeding problems on Coumadin.   No Known Allergies  Current outpatient prescriptions:albuterol (PROVENTIL) (2.5 MG/3ML) 0.083% nebulizer solution, Take 2.5 mg by nebulization every 6 (six) hours as needed.  , Disp: , Rfl: ;  albuterol-ipratropium (COMBIVENT) 18-103 MCG/ACT inhaler, Inhale 2 puffs into the lungs every 6 (six) hours as needed.  , Disp: , Rfl: ;  amLODipine (NORVASC) 5 MG tablet, Take 1 tablet (5 mg total) by mouth daily., Disp: 30 tablet, Rfl: 6 aspirin 81 MG tablet, Take 81 mg by mouth daily.  , Disp: , Rfl: ;  flecainide (TAMBOCOR) 100 MG tablet, Take 1 tablet (100 mg total) by mouth 2 (two) times daily., Disp: 60 tablet, Rfl: 5;  hydrochlorothiazide 25 MG tablet, Take 25 mg by mouth daily.  , Disp: , Rfl: ;  lisinopril (PRINIVIL,ZESTRIL) 20 MG tablet, Take 20 mg by mouth daily.  , Disp: , Rfl: ;  meclizine (ANTIVERT) 25 MG tablet, Take 25 mg by mouth as needed.  , Disp: , Rfl:  metoprolol (LOPRESSOR) 50 MG tablet, Take 50 mg by mouth 3 (three) times daily.  , Disp: , Rfl: ;  nitroGLYCERIN (NITRO-DUR) 0.2 mg/hr, Place 1 patch onto the skin daily.  , Disp: , Rfl: ;  nitroGLYCERIN (NITROSTAT) 0.4 MG SL tablet, Place 0.4 mg under the tongue every 5 (five) minutes as needed.  ,  Disp: , Rfl: ;  potassium chloride SA (KLOR-CON M20) 20 MEQ tablet, Take 20 mEq by mouth daily. , Disp: , Rfl:  rosuvastatin (CRESTOR) 40 MG tablet, Take 40 mg by mouth daily.  , Disp: , Rfl: ;  warfarin (COUMADIN) 5 MG tablet, Take 5 mg by mouth daily. As directed , Disp: , Rfl:   Past Medical History  Diagnosis Date  . Atrial fibrillation   . Coronary atherosclerosis of native coronary artery     Nonobstructive, LVEF 60%  . COPD (chronic obstructive pulmonary disease)   . Mixed hyperlipidemia   . Essential hypertension, benign   . Myocardial infarction   . Tachycardia-bradycardia syndrome     Past Surgical History  Procedure Date  . Appendectomy   . Total abdominal hysterectomy   . Insert / replace / remove pacemaker 3/11     St. Jude - Dr. [name]    Family History  Problem Relation Age of Onset  . Hypertension      Social History [name] reports that she has been smoking Cigarettes.  She has quit using smokeless tobacco. [name] reports that she does not drink alcohol.  Review of Systems No palpitations or chest pain. Occasional dizziness. No syncope. Otherwise reviewed and negative.  Physical Examination Filed Vitals:   03/15/11 1304  BP: 132/71  Pulse: 88  Additional Exam: Obese woman in no acute distress.  HEENT: Conjunctivae and lids normal, oropharynx clear.  Neck: Supple, no elevated jugular venous pressure  or loud bruits.  Lungs: Clear to auscultation, nonlabored.  Cardiac: Indistinct PMI, regular rate and rhythm, no S3 gallop.  Abdomen: Obese, unable to palpate liver edge, bowel sounds present, nontender.  Extremities: Trace peripheral edema, distal pulses one plus.  Skin: Warm and dry.  Musculoskeletal: No kyphosis noted.  Neuropsychiatric: Alert and oriented x3, affect appropriate.   Studies Echocardiogram 03/06/2011: - Left ventricle: The cavity size was normal. Wall thickness was     normal. Systolic function was normal. The estimated ejection      fraction was 55%. Wall motion was normal; there were no regional     wall motion abnormalities.   - Aortic valve: Mildly calcified annulus. Trileaflet; mildly     thickened leaflets. Very mild regurgitation.   - Mitral valve: Calcified annulus.   - Left atrium: The atrium was mildly dilated.   - Right ventricle: The cavity size was normal. Wall thickness was     mildly to moderately increased.   - Atrial septum: No defect or patent foramen ovale was identified.  Pulmonary function tests 03/01/2011:  1. Spirometry shows a severe ventilatory defect with evidence of       airflow obstruction.   2. Lung volumes show air trapping.   3. DLCO is moderately to severely reduced.   4. Arterial blood gas is normal.   5. There is significant bronchodilator improvement.   6. This study is consistent with the clinical diagnosis of COPD.  Problem List and Plan

## 2011-03-15 NOTE — Progress Notes (Signed)
Addended by: Augusto Gamble on: 03/15/2011 01:32 PM   Modules accepted: Orders

## 2011-03-15 NOTE — Assessment & Plan Note (Signed)
Nonobstructive by prior assessment, normal LVEF, no active angina.

## 2011-03-15 NOTE — Assessment & Plan Note (Signed)
Status post St. Jude pacemaker placement by Dr. [name].

## 2011-03-15 NOTE — Assessment & Plan Note (Signed)
Reportedly significant based on recent coronary function tests. Importance of smoking cessation was reviewed as noted. She does have nebulizer and MDI therapies available. Formal consultation with Pulmonary medicine is also being requested.

## 2011-03-15 NOTE — Assessment & Plan Note (Signed)
Symptomatically well controlled on present regimen including antiarrhythmic therapy and Coumadin. No changes made today.

## 2011-03-15 NOTE — Assessment & Plan Note (Signed)
We continue to discuss complete smoking cessation.

## 2011-03-15 NOTE — Patient Instructions (Signed)
Your physician recommends that you schedule a follow-up appointment in:6 months Referral to [facility]monology- we will contact you with an appt

## 2011-03-26 ENCOUNTER — Telehealth: Payer: Self-pay | Admitting: Cardiology

## 2011-03-26 MED ORDER — ROSUVASTATIN CALCIUM 40 MG PO TABS: 40.0000 mg | ORAL_TABLET | Freq: Every day | ORAL | Status: DC

## 2011-03-29 ENCOUNTER — Other Ambulatory Visit: Payer: Self-pay | Admitting: Cardiology

## 2011-04-02 ENCOUNTER — Other Ambulatory Visit: Payer: Self-pay | Admitting: *Deleted

## 2011-04-02 ENCOUNTER — Encounter: Payer: Self-pay | Admitting: Pulmonary Disease

## 2011-04-02 MED ORDER — WARFARIN SODIUM 5 MG PO TABS: ORAL_TABLET | ORAL | Status: DC

## 2011-04-03 ENCOUNTER — Institutional Professional Consult (permissible substitution): Payer: Medicare Other | Admitting: Pulmonary Disease

## 2011-04-05 ENCOUNTER — Ambulatory Visit (INDEPENDENT_AMBULATORY_CARE_PROVIDER_SITE_OTHER): Payer: Medicare Other | Admitting: *Deleted

## 2011-04-05 DIAGNOSIS — I4891 Unspecified atrial fibrillation: Secondary | ICD-10-CM

## 2011-04-05 DIAGNOSIS — Z7901 Long term (current) use of anticoagulants: Secondary | ICD-10-CM

## 2011-04-25 ENCOUNTER — Institutional Professional Consult (permissible substitution): Payer: Medicare Other | Admitting: Pulmonary Disease

## 2011-05-03 ENCOUNTER — Encounter: Payer: Medicare Other | Admitting: *Deleted

## 2011-05-07 ENCOUNTER — Ambulatory Visit (INDEPENDENT_AMBULATORY_CARE_PROVIDER_SITE_OTHER): Payer: Medicare Other | Admitting: *Deleted

## 2011-05-07 DIAGNOSIS — Z7901 Long term (current) use of anticoagulants: Secondary | ICD-10-CM

## 2011-05-07 DIAGNOSIS — I4891 Unspecified atrial fibrillation: Secondary | ICD-10-CM

## 2011-05-10 ENCOUNTER — Other Ambulatory Visit: Payer: Self-pay | Admitting: Medical

## 2011-06-04 ENCOUNTER — Ambulatory Visit (INDEPENDENT_AMBULATORY_CARE_PROVIDER_SITE_OTHER): Payer: Medicare Other | Admitting: *Deleted

## 2011-06-04 DIAGNOSIS — I4891 Unspecified atrial fibrillation: Secondary | ICD-10-CM

## 2011-06-04 DIAGNOSIS — Z7901 Long term (current) use of anticoagulants: Secondary | ICD-10-CM

## 2011-06-29 ENCOUNTER — Other Ambulatory Visit: Payer: Self-pay | Admitting: Cardiology

## 2011-07-02 ENCOUNTER — Ambulatory Visit (INDEPENDENT_AMBULATORY_CARE_PROVIDER_SITE_OTHER): Payer: Medicare Other | Admitting: *Deleted

## 2011-07-02 DIAGNOSIS — Z7901 Long term (current) use of anticoagulants: Secondary | ICD-10-CM

## 2011-07-02 DIAGNOSIS — I4891 Unspecified atrial fibrillation: Secondary | ICD-10-CM

## 2011-07-02 LAB — POCT CARDIAC MARKERS
CKMB, poc: 1.9
CKMB, poc: 2.1
Myoglobin, poc: 110
Operator id: 151321
Operator id: 222501
Troponin i, poc: <0.05
Troponin i, poc: <0.05
Troponin i, poc: <0.05

## 2011-07-02 LAB — CARDIAC PANEL(CRET KIN+CKTOT+MB+TROPI)
Relative Index: 2.1
Total CK: 159
Troponin I: <0.01

## 2011-07-02 LAB — CBC
HCT: 38.1
Hemoglobin: 13
MCV: 90.8
RBC: 4.2
WBC: 14.3 — ABNORMAL HIGH

## 2011-07-02 LAB — URINALYSIS, ROUTINE W REFLEX MICROSCOPIC
Bilirubin Urine: NEGATIVE
Hgb urine dipstick: NEGATIVE
Ketones, ur: NEGATIVE
Nitrite: NEGATIVE
Protein, ur: NEGATIVE
Specific Gravity, Urine: 1.008
Urobilinogen, UA: 1

## 2011-07-02 LAB — PROTIME-INR
INR: 2.8 — ABNORMAL HIGH
Prothrombin Time: 30.9 — ABNORMAL HIGH

## 2011-07-02 LAB — CK TOTAL AND CKMB (NOT AT ARMC)
Relative Index: 1.9
Total CK: 175

## 2011-07-02 LAB — TROPONIN I: Troponin I: <0.01

## 2011-07-02 LAB — DIFFERENTIAL
Basophils Absolute: 0.4 — ABNORMAL HIGH
Basophils Relative: 3 — ABNORMAL HIGH
Eosinophils Absolute: 0.1
Monocytes Relative: 6
Neutro Abs: 11.6 — ABNORMAL HIGH
Neutrophils Relative %: 81 — ABNORMAL HIGH

## 2011-07-02 LAB — COMPREHENSIVE METABOLIC PANEL
ALT: 28
Alkaline Phosphatase: 64
BUN: 9
CO2: 30
Calcium: 9.1
GFR calc non Af Amer: >60
Glucose, Bld: 113 — ABNORMAL HIGH
Potassium: 3.6
Total Protein: 5.9 — ABNORMAL LOW

## 2011-07-02 LAB — URINE CULTURE

## 2011-07-02 LAB — APTT: aPTT: 45 — ABNORMAL HIGH

## 2011-07-05 LAB — BASIC METABOLIC PANEL
BUN: 17
BUN: 19
CO2: 32
Calcium: 8.8
Creatinine, Ser: 0.78
Creatinine, Ser: 1.27 — ABNORMAL HIGH
GFR calc Af Amer: 52 — ABNORMAL LOW
GFR calc non Af Amer: >60
Glucose, Bld: 103 — ABNORMAL HIGH

## 2011-07-05 LAB — POCT CARDIAC MARKERS
CKMB, poc: 2.1
Operator id: 221061
Troponin i, poc: <0.05
Troponin i, poc: <0.05

## 2011-07-05 LAB — DIFFERENTIAL
Basophils Absolute: 0
Basophils Absolute: 0
Basophils Relative: 0
Eosinophils Absolute: 0.2
Lymphocytes Relative: 30
Lymphs Abs: 2.6
Monocytes Relative: 7
Neutro Abs: 7.7
Neutrophils Relative %: 59
Neutrophils Relative %: 75

## 2011-07-05 LAB — CBC
MCHC: 34.7
Platelets: 137 — ABNORMAL LOW
Platelets: 162
RBC: 4.08
RDW: 14.2
WBC: 8.6

## 2011-07-05 LAB — LIPID PANEL
Cholesterol: 97
HDL: 26 — ABNORMAL LOW
Triglycerides: 85

## 2011-07-05 LAB — PROTIME-INR
INR: 1.2
INR: 1.3
Prothrombin Time: 15.6 — ABNORMAL HIGH
Prothrombin Time: 16.9 — ABNORMAL HIGH

## 2011-07-05 LAB — TSH: TSH: 0.58

## 2011-07-05 LAB — CK TOTAL AND CKMB (NOT AT ARMC)
CK, MB: 2
Relative Index: 1.4
Total CK: 144

## 2011-07-05 LAB — TROPONIN I: Troponin I: <0.01

## 2011-07-09 ENCOUNTER — Other Ambulatory Visit: Payer: Self-pay | Admitting: *Deleted

## 2011-07-09 LAB — POCT I-STAT, CHEM 8
BUN: 18
Calcium, Ion: 1.05 — ABNORMAL LOW
Chloride: 100
Creatinine, Ser: 0.9
Glucose, Bld: 122 — ABNORMAL HIGH

## 2011-07-09 LAB — POCT CARDIAC MARKERS: Myoglobin, poc: 91

## 2011-07-09 MED ORDER — LISINOPRIL 20 MG PO TABS: 20.0000 mg | ORAL_TABLET | Freq: Every day | ORAL | Status: DC

## 2011-07-09 MED ORDER — HYDROCHLOROTHIAZIDE 25 MG PO TABS: 25.0000 mg | ORAL_TABLET | Freq: Every day | ORAL | Status: DC

## 2011-07-10 LAB — BASIC METABOLIC PANEL
CO2: 27
Calcium: 8.7
GFR calc Af Amer: >60
GFR calc non Af Amer: >60
Potassium: 2.9 — ABNORMAL LOW
Potassium: 4.1
Sodium: 134 — ABNORMAL LOW
Sodium: 138

## 2011-07-10 LAB — DIFFERENTIAL
Basophils Relative: 1
Lymphocytes Relative: 18
Monocytes Absolute: 0.8
Monocytes Relative: 10
Neutro Abs: 5.9

## 2011-07-10 LAB — POCT CARDIAC MARKERS
CKMB, poc: 1.4
CKMB, poc: 2.1
Myoglobin, poc: 107
Myoglobin, poc: 73.5

## 2011-07-10 LAB — CBC
Hemoglobin: 12.9
MCHC: 34.9
RBC: 4.05

## 2011-07-10 LAB — PROTIME-INR: INR: 2.2 — ABNORMAL HIGH

## 2011-07-10 LAB — MAGNESIUM: Magnesium: 1.8

## 2011-07-10 LAB — TROPONIN I: Troponin I: <0.01

## 2011-07-11 LAB — CARDIAC PANEL(CRET KIN+CKTOT+MB+TROPI)
CK, MB: 3.3
CK, MB: 3.3
Relative Index: 1.8
Relative Index: 1.9
Total CK: 185 — ABNORMAL HIGH
Troponin I: 0.01
Troponin I: 0.01

## 2011-07-11 LAB — HEPATIC FUNCTION PANEL
Bilirubin, Direct: <0.1
Indirect Bilirubin: 0.6

## 2011-07-11 LAB — POCT CARDIAC MARKERS
CKMB, poc: 2.6
Troponin i, poc: <0.05

## 2011-07-11 LAB — BASIC METABOLIC PANEL
BUN: 8
Chloride: 107
Glucose, Bld: 118 — ABNORMAL HIGH
Potassium: 3.5

## 2011-07-11 LAB — APTT: aPTT: 57 — ABNORMAL HIGH

## 2011-07-19 ENCOUNTER — Other Ambulatory Visit: Payer: Self-pay | Admitting: *Deleted

## 2011-07-19 MED ORDER — NITROGLYCERIN 0.2 MG/HR TD PT24: 1.0000 | MEDICATED_PATCH | Freq: Every day | TRANSDERMAL | Status: DC

## 2011-07-24 LAB — BASIC METABOLIC PANEL
BUN: 16
CO2: 29
Chloride: 103
Creatinine, Ser: 0.89
GFR calc non Af Amer: >60
Glucose, Bld: 107 — ABNORMAL HIGH

## 2011-07-24 LAB — CBC
MCHC: 33.8
RDW: 13.5

## 2011-07-24 LAB — PROTIME-INR
INR: 1
Prothrombin Time: 13.3

## 2011-07-25 LAB — COMPREHENSIVE METABOLIC PANEL
Alkaline Phosphatase: 69
BUN: 12
CO2: 24
GFR calc non Af Amer: >60
Glucose, Bld: 129 — ABNORMAL HIGH
Potassium: 4.3
Total Bilirubin: 0.4
Total Protein: 5.8 — ABNORMAL LOW

## 2011-07-25 LAB — BASIC METABOLIC PANEL
BUN: 7
CO2: 26
Calcium: 9
Chloride: 103
Chloride: 104
GFR calc Af Amer: >60
Glucose, Bld: 121 — ABNORMAL HIGH
Glucose, Bld: 122 — ABNORMAL HIGH
Potassium: 3.7
Sodium: 137
Sodium: 140

## 2011-07-25 LAB — DIFFERENTIAL
Basophils Absolute: 0
Basophils Absolute: 0
Basophils Absolute: 0
Basophils Relative: 0
Basophils Relative: 0
Eosinophils Absolute: 0.1
Eosinophils Absolute: 0.3
Eosinophils Relative: 1
Eosinophils Relative: 3
Lymphocytes Relative: 20
Lymphs Abs: 2.1
Monocytes Absolute: 0.9 — ABNORMAL HIGH
Monocytes Absolute: 1 — ABNORMAL HIGH
Monocytes Relative: 11
Monocytes Relative: 6
Neutro Abs: 6.8
Neutrophils Relative %: 75

## 2011-07-25 LAB — LIPID PANEL
HDL: 35 — ABNORMAL LOW
LDL Cholesterol: 120 — ABNORMAL HIGH
Total CHOL/HDL Ratio: 5.1
Triglycerides: 115
VLDL: 23

## 2011-07-25 LAB — CBC
HCT: 35.4 — ABNORMAL LOW
HCT: 35.7 — ABNORMAL LOW
HCT: 37.8
Hemoglobin: 12.2
Hemoglobin: 12.3
Hemoglobin: 13.1
MCHC: 34.6
MCV: 90.7
MCV: 91
Platelets: 158
RBC: 4.15
RDW: 13.7
RDW: 13.7
RDW: 13.8

## 2011-07-25 LAB — HEPARIN LEVEL (UNFRACTIONATED)
Heparin Unfractionated: 0.48
Heparin Unfractionated: 0.51
Heparin Unfractionated: 0.54

## 2011-07-25 LAB — CARDIAC PANEL(CRET KIN+CKTOT+MB+TROPI): Relative Index: 2.1

## 2011-07-25 LAB — PROTIME-INR
INR: 2 — ABNORMAL HIGH
Prothrombin Time: 23.9 — ABNORMAL HIGH
Prothrombin Time: 24 — ABNORMAL HIGH

## 2011-07-25 LAB — POCT CARDIAC MARKERS: Myoglobin, poc: 106

## 2011-07-25 LAB — MAGNESIUM: Magnesium: 2

## 2011-07-27 ENCOUNTER — Other Ambulatory Visit: Payer: Self-pay | Admitting: Medical

## 2011-07-30 ENCOUNTER — Encounter: Payer: Medicare Other | Admitting: *Deleted

## 2011-07-31 NOTE — Telephone Encounter (Signed)
Rx refill

## 2011-07-31 NOTE — Telephone Encounter (Signed)
Patient was notified that [name] could not refill her medication until she had an OV here in [place]. CLS

## 2011-08-16 ENCOUNTER — Telehealth: Payer: Self-pay | Admitting: Cardiology

## 2011-08-16 NOTE — Telephone Encounter (Signed)
Left message for patient to call to reschedule missed appt. / tg

## 2011-08-27 ENCOUNTER — Ambulatory Visit (INDEPENDENT_AMBULATORY_CARE_PROVIDER_SITE_OTHER): Payer: Medicare Other | Admitting: *Deleted

## 2011-08-27 DIAGNOSIS — I4891 Unspecified atrial fibrillation: Secondary | ICD-10-CM

## 2011-08-27 DIAGNOSIS — Z7901 Long term (current) use of anticoagulants: Secondary | ICD-10-CM

## 2011-08-27 LAB — POCT INR: INR: 2.7

## 2011-09-03 ENCOUNTER — Ambulatory Visit (INDEPENDENT_AMBULATORY_CARE_PROVIDER_SITE_OTHER): Payer: Medicare Other | Admitting: Internal Medicine

## 2011-09-03 ENCOUNTER — Encounter: Payer: Self-pay | Admitting: Internal Medicine

## 2011-09-03 DIAGNOSIS — L7682 Other postprocedural complications of skin and subcutaneous tissue: Secondary | ICD-10-CM

## 2011-09-03 DIAGNOSIS — R209 Unspecified disturbances of skin sensation: Secondary | ICD-10-CM

## 2011-09-03 DIAGNOSIS — I495 Sick sinus syndrome: Secondary | ICD-10-CM

## 2011-09-03 DIAGNOSIS — I4891 Unspecified atrial fibrillation: Secondary | ICD-10-CM

## 2011-09-03 DIAGNOSIS — Z950 Presence of cardiac pacemaker: Secondary | ICD-10-CM

## 2011-09-03 DIAGNOSIS — I259 Chronic ischemic heart disease, unspecified: Secondary | ICD-10-CM

## 2011-09-03 LAB — PACEMAKER DEVICE OBSERVATION
AL AMPLITUDE: 5 mv
DEVICE MODEL PM: 7115296
RV LEAD AMPLITUDE: 12 mv
RV LEAD IMPEDENCE PM: 450 Ohm
RV LEAD THRESHOLD: 1 V
VENTRICULAR PACING PM: 1

## 2011-09-03 NOTE — Assessment & Plan Note (Signed)
Her device is working normally. No evidence of overt infection. Will follow.

## 2011-09-03 NOTE — Assessment & Plan Note (Signed)
The patient's pain around her PPM site is concerning but she has no demonstrative evidence of infection. I have asked her to undergo a period of watchful waiting. Will review her most recent lab work which I have asked her to obtain and give to [place] for review.

## 2011-09-03 NOTE — Progress Notes (Signed)
HPI [name] returns today for an unscheduled followup. She is a pleasant 60 yo woman with a h/o symptomatic bradycardia, s/p PPM. She was well until several weeks ago when she began to experience pain around her PPM site. She denies fever, chills, or night sweats. No drainage or swelling. She took low dose hydrocodone without improvement.  No Known Allergies   Current Outpatient Prescriptions  Medication Sig Dispense Refill  . albuterol (PROVENTIL) (2.5 MG/3ML) 0.083% nebulizer solution Take 2.5 mg by nebulization every 6 (six) hours as needed.        [name] albuterol-ipratropium (COMBIVENT) 18-103 MCG/ACT inhaler Inhale 2 puffs into the lungs every 6 (six) hours as needed.        [name] amLODipine (NORVASC) 5 MG tablet Take 1 tablet (5 mg total) by mouth daily.  30 tablet  6  . aspirin 81 MG tablet Take 81 mg by mouth daily.        . flecainide (TAMBOCOR) 100 MG tablet Take 1 tablet (100 mg total) by mouth 2 (two) times daily.  60 tablet  5  . hydrochlorothiazide (HYDRODIURIL) 25 MG tablet Take 1 tablet (25 mg total) by mouth daily.  30 tablet  8  . HYDROcodone-acetaminophen (LORTAB) 7.5-500 MG per tablet Take 1 tablet by mouth every 6 (six) hours as needed.        [name] KLOR-CON M20 20 MEQ tablet TAKE 1 TABLET BY MOUTH TWICE A DAY  60 tablet  2  . lisinopril (PRINIVIL,ZESTRIL) 20 MG tablet Take 1 tablet (20 mg total) by mouth daily.  30 tablet  8  . meclizine (ANTIVERT) 25 MG tablet Take 25 mg by mouth as needed.        . metoprolol (LOPRESSOR) 50 MG tablet TAKE 1 TABLET BY MOUTH 3 TIMES A DAY  90 tablet  5  . nitroGLYCERIN (NITRO-DUR) 0.2 mg/hr Place 1 patch (0.2 mg total) onto the skin daily.  30 patch  3  . nitroGLYCERIN (NITROSTAT) 0.4 MG SL tablet Place 0.4 mg under the tongue every 5 (five) minutes as needed.        . rosuvastatin (CRESTOR) 40 MG tablet Take 1 tablet (40 mg total) by mouth daily.  30 tablet  3  . warfarin (COUMADIN) 5 MG tablet 5mg  daily except 2.5mg  on Tuesdays, Thursdays and  Saturdays or as directed by anticoagulation clinic  45 tablet  2     Past Medical History  Diagnosis Date  . Atrial fibrillation   . Coronary atherosclerosis of native coronary artery     Nonobstructive, LVEF 60%  . COPD (chronic obstructive pulmonary disease)   . Mixed hyperlipidemia   . Essential hypertension, benign   . Myocardial infarction   . Tachycardia-bradycardia syndrome     ROS:   All systems reviewed and negative except as noted in the HPI.   Past Surgical History  Procedure Date  . Appendectomy   . Total abdominal hysterectomy   . Insert / replace / remove pacemaker 3/11     St. Jude - Dr. [name]     Family History  Problem Relation Age of Onset  . Hypertension       History   Social History  . Marital Status: Widowed    Spouse Name: N/A    Number of Children: N/A  . Years of Education: N/A   Occupational History  . Unemployed    Social History Main Topics  . Smoking status: Current Some Day Smoker    Types: Cigarettes  .  Smokeless tobacco: Former Neurosurgeon  . Alcohol Use: No  . Drug Use: No  . Sexually Active: Not on file   Other Topics Concern  . Not on file   Social History Narrative  . No narrative on file     BP 151/79  Pulse 61  Ht 5\' 2"  (1.575 m)  Wt 87.544 kg (193 lb)  BMI 35.30 kg/m2  Physical Exam:  Well appearing, obese, middle aged woman, NAD HEENT: Unremarkable Neck:  No JVD, no thyromegally Lymphatics:  No adenopathy Back:  No CVA tenderness Lungs:  Clear with no wheezes, rales, or rhonchi. Well healed PPM incision. No erythema or swelling. Minimally tender. HEART:  Regular rate rhythm, no murmurs, no rubs, no clicks Abd:  soft, positive bowel sounds, no organomegally, no rebound, no guarding Ext:  2 plus pulses, no edema, no cyanosis, no clubbing Skin:  No rashes no nodules Neuro:  CN II through XII intact, motor grossly intact  DEVICE  Normal device function.  See PaceArt for details.   Assess/Plan:

## 2011-09-03 NOTE — Assessment & Plan Note (Signed)
She appears to be maintaining NSR. Continue current medical therapy.

## 2011-09-03 NOTE — Patient Instructions (Signed)
Your physician has recommended you make the following change in your medication: start taking Aleve as directed for chest pain  Your physician recommends that you schedule a follow-up appointment in: 2 months

## 2011-09-13 ENCOUNTER — Encounter: Payer: Self-pay | Admitting: Cardiology

## 2011-09-18 ENCOUNTER — Other Ambulatory Visit: Payer: Self-pay | Admitting: *Deleted

## 2011-09-18 MED ORDER — WARFARIN SODIUM 5 MG PO TABS: ORAL_TABLET | ORAL | Status: DC

## 2011-09-20 ENCOUNTER — Ambulatory Visit: Payer: Medicare Other | Admitting: Cardiology

## 2011-09-24 ENCOUNTER — Ambulatory Visit (INDEPENDENT_AMBULATORY_CARE_PROVIDER_SITE_OTHER): Payer: Medicare Other | Admitting: Cardiology

## 2011-09-24 ENCOUNTER — Encounter: Payer: Self-pay | Admitting: Cardiology

## 2011-09-24 VITALS — BP 151/85 | HR 60 | Resp 16 | Ht 62.0 in | Wt 194.0 lb

## 2011-09-24 DIAGNOSIS — I2510 Atherosclerotic heart disease of native coronary artery without angina pectoris: Secondary | ICD-10-CM

## 2011-09-24 DIAGNOSIS — Z950 Presence of cardiac pacemaker: Secondary | ICD-10-CM

## 2011-09-24 DIAGNOSIS — I4891 Unspecified atrial fibrillation: Secondary | ICD-10-CM

## 2011-09-24 DIAGNOSIS — R079 Chest pain, unspecified: Secondary | ICD-10-CM | POA: Insufficient documentation

## 2011-09-24 LAB — BASIC METABOLIC PANEL
BUN: 14 mg/dL (ref 6–23)
CO2: 30 mEq/L (ref 19–32)
Chloride: 100 mEq/L (ref 96–112)
Creat: 0.8 mg/dL (ref 0.50–1.10)

## 2011-09-24 NOTE — Patient Instructions (Signed)
Your physician recommends that you schedule a follow-up appointment in: 6 months  Non-Cardiac CT scanning, (CAT scanning), is a noninvasive, special x-ray that produces cross-sectional images of the body using x-rays and a computer. CT scans help physicians diagnose and treat medical conditions. For some CT exams, a contrast material is used to enhance visibility in the area of the body being studied. CT scans provide greater clarity and reveal more details than regular x-ray exams.  Your physician recommends that you return for lab work in: Today

## 2011-09-24 NOTE — Progress Notes (Signed)
Clinical Summary [name] is a 60 y.o.female presenting for followup. I saw her in June. She has had more recent followup with Mr. Tysinger and also Dr. [name] related to atypical left-sided chest pain. She reports a 6 week history of a "dull, throbbing" left-sided chest pain, also noted in the shoulder, around her pacemaker site, and in the left side of her neck and back. She states that she simply woke up one morning feeling this way. No exertional component. Does seem to feel worse if she presses on her left chest, also when she turns over on her left side in the bed. It has gotten no better.  She had a recent device interrogation in November that demonstrated no problems. She has had no recent imaging of her chest. Chest x-ray from May of this year demonstrated no pneumothorax, no edema or infiltrates, dual-chamber pacer in place. She denies any aggressive cough, fevers or chills. No rash or erythema around her pacemaker pocket. No drainage.   No Known Allergies  Medication list reviewed.  Past Medical History  Diagnosis Date  . Atrial fibrillation   . Coronary atherosclerosis of native coronary artery     Nonobstructive, LVEF 60%  . COPD (chronic obstructive pulmonary disease)   . Mixed hyperlipidemia   . Essential hypertension, benign   . Myocardial infarction   . Tachycardia-bradycardia syndrome     Past Surgical History  Procedure Date  . Appendectomy   . Total abdominal hysterectomy   . Insert / replace / remove pacemaker 3/11     St. Jude - Dr. [name]    Family History  Problem Relation Age of Onset  . Hypertension      Social History Ms. Kinchen reports that she has been smoking Cigarettes.  She has quit using smokeless tobacco. Ms. Rowton reports that she does not drink alcohol.  Review of Systems No palpitations or progressive shortness of breath. Otherwise negative except as outlined.  Physical Examination Filed Vitals:   09/24/11 1035  BP: 151/85  Pulse: 60    Resp: 16   Obese woman in no acute distress.  HEENT: Conjunctivae and lids normal, oropharynx clear.  Neck: Supple, no elevated jugular venous pressure or loud bruits.  Lungs: Clear to auscultation, nonlabored.  Thorax: Well-healed pacemaker pocket site, no erythema or drainage. Can is stable. Cardiac: Indistinct PMI, regular rate and rhythm, no S3 gallop.  Abdomen: Obese, bowel sounds present, nontender.  Extremities: Trace peripheral edema, distal pulses one plus.  Skin: Warm and dry.  Musculoskeletal: No kyphosis noted.  Neuropsychiatric: Alert and oriented x3, affect appropriate.   ECG Atrial paced rhythm at 60.    Problem List and Plan

## 2011-09-24 NOTE — Assessment & Plan Note (Signed)
Prior documentation of nonobstructive disease, present symptoms are very atypical for ischemia.

## 2011-09-24 NOTE — Assessment & Plan Note (Signed)
Followed by Dr. Taylor 

## 2011-09-24 NOTE — Assessment & Plan Note (Signed)
Symptomatically well-controlled on antiarrhythmic therapy and Coumadin.

## 2011-09-24 NOTE — Assessment & Plan Note (Signed)
Described above, etiology not clear. Very atypical for ischemia. ECG without acute ST changes with atrial pacing. Patient's pacemaker pocket site looks well healed without erythema or drainage. No recent fevers or chills. Our plan is to obtain a CT scan of the chest to obtain more objective information. Plain films of the chest from May demonstrated no acute findings.

## 2011-09-25 ENCOUNTER — Other Ambulatory Visit: Payer: Self-pay | Admitting: *Deleted

## 2011-09-25 DIAGNOSIS — R52 Pain, unspecified: Secondary | ICD-10-CM

## 2011-09-26 ENCOUNTER — Ambulatory Visit (HOSPITAL_COMMUNITY): Payer: Medicare Other

## 2011-09-26 ENCOUNTER — Telehealth: Payer: Self-pay | Admitting: *Deleted

## 2011-09-26 ENCOUNTER — Ambulatory Visit (HOSPITAL_COMMUNITY)
Admission: RE | Admit: 2011-09-26 | Discharge: 2011-09-26 | Disposition: A | Discharge status: Home / Self Care | Payer: Medicare Other | Source: Ambulatory Visit | Attending: Cardiology | Admitting: Cardiology

## 2011-09-26 DIAGNOSIS — K7689 Other specified diseases of liver: Secondary | ICD-10-CM | POA: Insufficient documentation

## 2011-09-26 DIAGNOSIS — M25519 Pain in unspecified shoulder: Secondary | ICD-10-CM | POA: Insufficient documentation

## 2011-09-26 DIAGNOSIS — J4489 Other specified chronic obstructive pulmonary disease: Secondary | ICD-10-CM | POA: Insufficient documentation

## 2011-09-26 DIAGNOSIS — R599 Enlarged lymph nodes, unspecified: Secondary | ICD-10-CM | POA: Insufficient documentation

## 2011-09-26 DIAGNOSIS — R079 Chest pain, unspecified: Secondary | ICD-10-CM | POA: Insufficient documentation

## 2011-09-26 DIAGNOSIS — J449 Chronic obstructive pulmonary disease, unspecified: Secondary | ICD-10-CM | POA: Insufficient documentation

## 2011-09-26 MED ORDER — IOHEXOL 300 MG/ML  SOLN
80.0000 mL | Freq: Once | INTRAMUSCULAR | Status: AC | PRN
  Administered 2011-09-26: 80 mL via INTRAVENOUS

## 2011-09-26 NOTE — Telephone Encounter (Signed)
Results called to patient.  Referral made to [facility] Pulmonology.  Appt is Friday December 21st @ 9:15.  Copy of result sent to PCP.

## 2011-09-27 ENCOUNTER — Other Ambulatory Visit: Payer: Self-pay | Admitting: Cardiology

## 2011-09-28 ENCOUNTER — Ambulatory Visit (INDEPENDENT_AMBULATORY_CARE_PROVIDER_SITE_OTHER)
Admission: RE | Admit: 2011-09-28 | Discharge: 2011-09-28 | Disposition: A | Discharge status: Home / Self Care | Payer: Medicare Other | Source: Ambulatory Visit | Attending: Internal Medicine | Admitting: Internal Medicine

## 2011-09-28 ENCOUNTER — Ambulatory Visit (INDEPENDENT_AMBULATORY_CARE_PROVIDER_SITE_OTHER): Payer: Medicare Other | Admitting: Internal Medicine

## 2011-09-28 ENCOUNTER — Encounter: Payer: Self-pay | Admitting: Internal Medicine

## 2011-09-28 VITALS — BP 128/64 | HR 71 | Temp 97.2°F | Ht 62.0 in | Wt 198.0 lb

## 2011-09-28 DIAGNOSIS — R918 Other nonspecific abnormal finding of lung field: Secondary | ICD-10-CM

## 2011-09-28 DIAGNOSIS — R079 Chest pain, unspecified: Secondary | ICD-10-CM

## 2011-09-28 DIAGNOSIS — J449 Chronic obstructive pulmonary disease, unspecified: Secondary | ICD-10-CM

## 2011-09-28 DIAGNOSIS — I10 Essential (primary) hypertension: Secondary | ICD-10-CM

## 2011-09-28 MED ORDER — AZILSARTAN-CHLORTHALIDONE 40-25 MG PO TABS: 1.0000 | ORAL_TABLET | Freq: Every day | ORAL | Status: DC

## 2011-09-28 MED ORDER — TRAMADOL HCL 50 MG PO TABS: 50.0000 mg | ORAL_TABLET | Freq: Four times a day (QID) | ORAL | Status: DC

## 2011-09-28 NOTE — Assessment & Plan Note (Addendum)
GOLD II and yet Symptoms are markedly disproportionate to objective findings and not clear this is all a  lung problem (especially the left should pain which does not correlate with ct findings)  but pt does appear to have difficult airway management issues.   DDX of  difficult airways managment all start with A and  include Adherence, Ace Inhibitors, Acid Reflux, Active Sinus Disease, Alpha 1 Antitripsin deficiency, Anxiety masquerading as Airways dz,  ABPA,  allergy(esp in young), Aspiration (esp in elderly), Adverse effects of DPI,  Active smokers, plus two Bs  = Bronchiectasis and Beta blocker use..and one C= CHF  Adherence is always the initial "prime suspect" and is a multilayered concern that requires a "trust but verify" approach in every patient - starting with knowing how to use medications, especially inhalers, correctly, keeping up with refills and understanding the fundamental difference between maintenance and prns vs those medications only taken for a very short course and then stopped and not refilled. Needs to use the neb more consistently when can't catch her breath  ? ACEI > try off x 4 weeks for new baseline in terms of symptom control  Active smoking> encouraged to maintain abstinence

## 2011-09-28 NOTE — Assessment & Plan Note (Signed)
Although there are clearly multiple abnormalities on CT scan, they should probably be considered "microscopic" since not obvious on plain cxr .     In the setting of obvious "macroscopic" health issues, will try to address these first and proceed with a PET Scan to look at the largest of the MPN's which is macroscopic in that it is obvious on plain cxr and was absent in 12/2009.

## 2011-09-28 NOTE — Patient Instructions (Addendum)
Please remember to go to the  x-ray department downstairs for your tests - we will call you with the results when they are available.  Congratulations on stopping smoking  Please see patient coordinator before you leave today  to schedule a PET scan   Start edarby/hct one daily in place of lisinopril and hydrochlorothiazide  Take Tramadol 50 mg 4 x daily and supplement with your pain medication as needed  Please schedule a follow up office visit in 4 weeks, sooner if needed

## 2011-09-28 NOTE — Progress Notes (Signed)
Quick Note:  Spoke with pt and notified of results per Dr. Wert. Pt verbalized understanding and denied any questions.  ______ 

## 2011-09-28 NOTE — Assessment & Plan Note (Signed)
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

## 2011-09-28 NOTE — Progress Notes (Signed)
  Subjective:    Patient ID: [name], female    DOB: [birth year], 60 y.o.   MRN: [id number]  HPI  89 yowf quit smoking 09/26/11 referred by Dr Diona Browner 09/28/2011 for eval of MPN's by CT 09/25/11   09/28/2011 1st pulmonary eval cc acute onset L shoulder pain and Ant CP x 6 weeks gradually worse only some better x p 7.5 mg of Hydrocodone,  Somewhat positional, worse with coughing, worse with abduction of L arm,  Assoc  with more productive cough x sev months > clear mucus, no blood.  Also now worse Doe x 50 feet.  Sleeps on L side x years but horizontal - breathing much better p neb.    Denies any obvious fluctuation of symptoms with weather or environmental changes or other aggravating or alleviating factors except as outlined above   Review of Systems  Constitutional: Negative for fever, chills and unexpected weight change.  HENT: Positive for congestion. Negative for ear pain, nosebleeds, sore throat, rhinorrhea, sneezing, trouble swallowing, dental problem, voice change, postnasal drip and sinus pressure.   Eyes: Negative for visual disturbance.  Respiratory: Positive for cough and shortness of breath. Negative for choking.   Cardiovascular: Positive for leg swelling. Negative for chest pain.  Gastrointestinal: Positive for abdominal pain. Negative for vomiting and diarrhea.  Genitourinary: Negative for difficulty urinating.  Musculoskeletal: Positive for arthralgias.  Skin: Negative for rash.  Neurological: Negative for tremors, syncope and headaches.  Hematological: Does not bruise/bleed easily.       Objective:   Physical Exam Anxious amb wf nad with mild pseudowheeze Wt  198 09/28/2011  HEENT mild turbinate edema.  Oropharynx no thrush or excess pnd or cobblestoning.  No JVD or cervical adenopathy. Mild accessory muscle hypertrophy. Trachea midline, nl thryroid. Chest was hyperinflated by percussion with diminished breath sounds and moderate increased exp time without  wheeze. Hoover sign positive at mid inspiration. Regular rate and rhythm without murmur gallop or rub or increase P2 or edema.  Abd: no hsm, nl excursion. Ext warm without cyanosis or clubbing.  MS Pos L shoulder pain worse with abd beyond 60degrees with mod crepitance.   CXR  09/28/2011 :  New ( since 12/10/09)  conspicuousRight middle lobe nodule which may represent a primary or secondary malignancy. Additional nodules are not well evaluated on the chest x-ray.         Assessment & Plan:

## 2011-10-05 ENCOUNTER — Encounter (HOSPITAL_COMMUNITY)
Admission: RE | Admit: 2011-10-05 | Discharge: 2011-10-05 | Disposition: A | Discharge status: Home / Self Care | Payer: Medicare Other | Source: Ambulatory Visit | Attending: Internal Medicine | Admitting: Internal Medicine

## 2011-10-05 ENCOUNTER — Encounter: Payer: Self-pay | Admitting: Internal Medicine

## 2011-10-05 DIAGNOSIS — K5730 Diverticulosis of large intestine without perforation or abscess without bleeding: Secondary | ICD-10-CM | POA: Insufficient documentation

## 2011-10-05 DIAGNOSIS — R918 Other nonspecific abnormal finding of lung field: Secondary | ICD-10-CM

## 2011-10-05 DIAGNOSIS — K7689 Other specified diseases of liver: Secondary | ICD-10-CM | POA: Insufficient documentation

## 2011-10-05 DIAGNOSIS — J984 Other disorders of lung: Secondary | ICD-10-CM | POA: Insufficient documentation

## 2011-10-05 DIAGNOSIS — Z90710 Acquired absence of both cervix and uterus: Secondary | ICD-10-CM | POA: Insufficient documentation

## 2011-10-05 DIAGNOSIS — M25519 Pain in unspecified shoulder: Secondary | ICD-10-CM | POA: Insufficient documentation

## 2011-10-05 DIAGNOSIS — R222 Localized swelling, mass and lump, trunk: Secondary | ICD-10-CM | POA: Insufficient documentation

## 2011-10-05 MED ORDER — FLUDEOXYGLUCOSE F - 18 (FDG) INJECTION
18.8000 | Freq: Once | INTRAVENOUS | Status: AC | PRN
  Administered 2011-10-05: 18.8 via INTRAVENOUS

## 2011-10-08 ENCOUNTER — Ambulatory Visit (INDEPENDENT_AMBULATORY_CARE_PROVIDER_SITE_OTHER): Payer: Medicare Other | Admitting: *Deleted

## 2011-10-08 DIAGNOSIS — I4891 Unspecified atrial fibrillation: Secondary | ICD-10-CM

## 2011-10-08 DIAGNOSIS — Z7901 Long term (current) use of anticoagulants: Secondary | ICD-10-CM

## 2011-10-08 LAB — POCT INR: INR: 4.2

## 2011-10-10 ENCOUNTER — Other Ambulatory Visit: Payer: Self-pay

## 2011-10-10 ENCOUNTER — Telehealth: Payer: Self-pay | Admitting: Internal Medicine

## 2011-10-10 MED ORDER — AMLODIPINE BESYLATE 5 MG PO TABS: 5.0000 mg | ORAL_TABLET | Freq: Every day | ORAL | Status: DC

## 2011-10-10 NOTE — Telephone Encounter (Signed)
Spoke with pt. She is requesting the results of PET scan. I advised will forward msg to MW so he is aware she is wanting these results. She is aware he is not back in the office until 10/11/11.

## 2011-10-12 NOTE — Telephone Encounter (Signed)
Pt aware of results and does not need copy sent to her.Carron Curie, CMA

## 2011-10-12 NOTE — Telephone Encounter (Signed)
Ok to give copy but I discussed results with her by phone 10/11/11 > regroiup in office to determine next step

## 2011-10-24 ENCOUNTER — Ambulatory Visit (INDEPENDENT_AMBULATORY_CARE_PROVIDER_SITE_OTHER): Payer: Medicare Other | Admitting: *Deleted

## 2011-10-24 DIAGNOSIS — Z7901 Long term (current) use of anticoagulants: Secondary | ICD-10-CM

## 2011-10-24 DIAGNOSIS — I4891 Unspecified atrial fibrillation: Secondary | ICD-10-CM

## 2011-10-24 LAB — POCT INR: INR: 2.2

## 2011-10-25 ENCOUNTER — Telehealth: Payer: Self-pay | Admitting: Internal Medicine

## 2011-10-25 MED ORDER — AZILSARTAN-CHLORTHALIDONE 40-25 MG PO TABS: 1.0000 | ORAL_TABLET | Freq: Every day | ORAL | Status: DC

## 2011-10-25 NOTE — Telephone Encounter (Signed)
Pt saw MW for a consult on 09/28/11.  MW changed pt from Lisinopril and HCTZ to Edarby/HCT.  Pt calling for refill on this.  Pt has pending appt with MW on 10/29/11 to regroup.  Therefore rx called into pharmacy.  Called and spoke with pt and informed her of this and reminded her to keep her pending with MW to regroup.  Pt verbalized understanding and will keep appt.

## 2011-10-29 ENCOUNTER — Ambulatory Visit (INDEPENDENT_AMBULATORY_CARE_PROVIDER_SITE_OTHER): Payer: Medicare Other | Admitting: Internal Medicine

## 2011-10-29 ENCOUNTER — Encounter: Payer: Self-pay | Admitting: Internal Medicine

## 2011-10-29 VITALS — BP 130/72 | HR 61 | Temp 98.2°F | Ht 62.0 in | Wt 195.2 lb

## 2011-10-29 DIAGNOSIS — J449 Chronic obstructive pulmonary disease, unspecified: Secondary | ICD-10-CM

## 2011-10-29 DIAGNOSIS — M25519 Pain in unspecified shoulder: Secondary | ICD-10-CM

## 2011-10-29 DIAGNOSIS — R05 Cough: Secondary | ICD-10-CM

## 2011-10-29 DIAGNOSIS — R918 Other nonspecific abnormal finding of lung field: Secondary | ICD-10-CM

## 2011-10-29 DIAGNOSIS — I10 Essential (primary) hypertension: Secondary | ICD-10-CM

## 2011-10-29 DIAGNOSIS — F17200 Nicotine dependence, unspecified, uncomplicated: Secondary | ICD-10-CM

## 2011-10-29 MED ORDER — NEBIVOLOL HCL 10 MG PO TABS: 10.0000 mg | ORAL_TABLET | Freq: Two times a day (BID) | ORAL | Status: DC

## 2011-10-29 MED ORDER — OLMESARTAN-AMLODIPINE-HCTZ 40-5-25 MG PO TABS: 1.0000 | ORAL_TABLET | Freq: Every day | ORAL | Status: DC

## 2011-10-29 NOTE — Patient Instructions (Signed)
Stop norvasc edarby and lopressor  Start Tribenzor daily and bystolic 10 mg twice daily in their place  Please see patient coordinator before you leave today  to schedule orthopedic evaluation for your L Shoulder  Please schedule a follow up office visit in 4 weeks, sooner if needed with PFTs

## 2011-10-29 NOTE — Progress Notes (Signed)
  Subjective:    Patient ID: [name], female    DOB: [birth year], 61 y.o.   MRN: [id number]  HPI  32 yowf smoker GOLD III COPD  referred by Dr Diona Browner 09/28/2011 for eval of MPN's by CT 09/25/11   09/28/2011 1st pulmonary eval cc acute onset L shoulder pain and Ant CP x 6 weeks gradually worse only some better x p 7.5 mg of Hydrocodone,  Somewhat positional, worse with coughing, worse with abduction of L arm,  Assoc  with more productive cough x sev months > clear mucus, no blood.  Also now worse Doe x 50 feet.  Sleeps on L side x years but horizontal - breathing much better p neb.   rec Please remember to go to the  x-ray department downstairs for your tests - we will call you with the results when they are available.  Congratulations on stopping smoking  Please see patient coordinator before you leave today  to schedule a PET scan > only POS RML/RLL  Start edarby/hct one daily in place of lisinopril and hydrochlorothiazide  Take Tramadol 50 mg 4 x daily and supplement with your pain medication as needed        10/29/2011 f/u ov/[name] cc cough much better until caught a cold 7 d Prior to day of OV  And smoking again, no purulent sputum or hemoptysis. No change in L shoulder pain, remains positional, some better on tramadol but needing to take it qid. Needing also lots of albuterol for breathing "helps someP  Sleeping ok without nocturnal  or early am exacerbation  of respiratory  c/o's or need for noct saba. Also denies any obvious fluctuation of symptoms with weather or environmental changes or other aggravating or alleviating factors except as outlined above    ROS  At present neg for  any significant sore throat, dysphagia, itching, sneezing,   fever, chills, sweats, unintended wt loss, classic pleuritic or exertional cp, hempoptysis, orthopnea pnd or leg swelling.  Also denies presyncope, palpitations, heartburn, abdominal pain, nausea, vomiting, diarrhea  or change in bowel  or urinary habits, dysuria,hematuria,  rash, arthralgias, visual complaints, headache, numbness weakness or ataxia.            Objective:   Physical Exam Anxious amb wf nad with no longer pseudowheeze Wt  198 09/28/2011 > 10/29/2011  195 HEENT mild turbinate edema.  Oropharynx no thrush or excess pnd or cobblestoning.  No JVD or cervical adenopathy. Mild accessory muscle hypertrophy. Trachea midline, nl thryroid. Chest was hyperinflated by percussion with diminished breath sounds and moderate increased exp time without wheeze. Hoover sign positive at mid inspiration. Regular rate and rhythm without murmur gallop or rub or increase P2 or edema.  Abd: no hsm, nl excursion. Ext warm without cyanosis or clubbing.  MS Pos L shoulder pain worse with abd beyond 60degrees with mod crepitance.   CXR  09/28/2011 :  New ( since 12/10/09)  Conspicuous Right middle lobe nodule which may represent a primary or secondary malignancy. Additional nodules are not well evaluated on the chest x-ray.         Assessment & Plan:

## 2011-10-30 NOTE — Assessment & Plan Note (Signed)
>   3 min discussion with pt  I emphasized that although we never turn away smokers from the pulmonary clinic, we do ask that they understand that the recommendations that we make  won't work nearly as well in the presence of continued cigarette exposure.  In fact, we may very well  reach a point where we can't promise to help the patient if he/she can't quit smoking. (We can and will promise to try to help, we just can't promise what we recommend will really work)

## 2011-10-30 NOTE — Assessment & Plan Note (Signed)
Until we make a decision re severity of copd, would like to avoid any antihypertensive that muddies the water in terms of interpretation of symptoms so will maintain on tribenzor and bystolic until returns for new baseline pft's in 4 weeks

## 2011-10-30 NOTE — Assessment & Plan Note (Signed)
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

## 2011-10-30 NOTE — Assessment & Plan Note (Signed)
Plan for now is to maximize rx for copd then refer to the [facility] clinic for evaluation but at this point not an operable lesion

## 2011-10-30 NOTE — Assessment & Plan Note (Signed)
Gold III copd not amenable to rml surgery even if proves to have Stage I lung ca, which is possible but PET scan suggests may have second primary in RLL and Bilobectomy is completely out of the question so will work on optimizing lung function then bring back for repeat pft's  DDX of  difficult airways managment all start with A and  include Adherence, Ace Inhibitors, Acid Reflux, Active Sinus Disease, Alpha 1 Antitripsin deficiency, Anxiety masquerading as Airways dz,  ABPA,  allergy(esp in young), Aspiration (esp in elderly), Adverse effects of DPI,  Active smokers, plus two Bs  = Bronchiectasis and Beta blocker use..and one C= CHF  Active smoking the greatest concern, discussed  Ace inhibitors need to remain off for now  Beta blockers also problematic since she feels so much better p albuterol and is on high dose lopressor:  Strongly prefer in this setting: Bystolic, the most beta -1  selective Beta blocker available in sample form, with bisoprolol the most selective generic choice  on the market.

## 2011-11-07 ENCOUNTER — Ambulatory Visit (INDEPENDENT_AMBULATORY_CARE_PROVIDER_SITE_OTHER): Payer: Medicare Other | Admitting: Internal Medicine

## 2011-11-07 ENCOUNTER — Encounter: Payer: Self-pay | Admitting: Internal Medicine

## 2011-11-07 VITALS — BP 138/76 | HR 77 | Resp 18 | Ht 62.0 in | Wt 188.0 lb

## 2011-11-07 DIAGNOSIS — Z950 Presence of cardiac pacemaker: Secondary | ICD-10-CM

## 2011-11-07 DIAGNOSIS — I4891 Unspecified atrial fibrillation: Secondary | ICD-10-CM

## 2011-11-07 DIAGNOSIS — I10 Essential (primary) hypertension: Secondary | ICD-10-CM

## 2011-11-07 DIAGNOSIS — I495 Sick sinus syndrome: Secondary | ICD-10-CM

## 2011-11-07 LAB — PACEMAKER DEVICE OBSERVATION
AL AMPLITUDE: 5 mv
AL IMPEDENCE PM: 437.5 Ohm
BATTERY VOLTAGE: 2.9629 V
RV LEAD AMPLITUDE: 12 mv

## 2011-11-07 NOTE — Assessment & Plan Note (Signed)
Her blood pressure is well controlled. She will continue her current meds and I have asked her to maintain a low sodium diet.

## 2011-11-07 NOTE — Assessment & Plan Note (Signed)
Her device is working normally and her incisional pain has resolved.

## 2011-11-07 NOTE — Assessment & Plan Note (Signed)
She is maintaining NSR on flecainide and beta blockers. These will be continued.

## 2011-11-07 NOTE — Progress Notes (Signed)
HPI Mrs. Chaudhary returns today for followup. She is a pleasant 61 yo woman with a h/o symptomatic bradycardia, s/p PPM, HTN, and PAF. She saw me 4 months ago with severe incisional pain. This has resolved. She has subsequently been diagnosed with lung CA. She is pending treatment. No sob or chest pain. No cough. She has copd and is pending a decision about whether to proceed with surgery. Allergies  Allergen Reactions  . Latex     "pulls skin off"     Current Outpatient Prescriptions  Medication Sig Dispense Refill  . albuterol (PROVENTIL) (2.5 MG/3ML) 0.083% nebulizer solution Take 2.5 mg by nebulization every 6 (six) hours as needed.        [name] albuterol-ipratropium (COMBIVENT) 18-103 MCG/ACT inhaler Inhale 2 puffs into the lungs every 6 (six) hours as needed.        [name] aspirin 81 MG tablet Take 81 mg by mouth daily.        . flecainide (TAMBOCOR) 100 MG tablet Take 1 tablet (100 mg total) by mouth 2 (two) times daily.  60 tablet  5  . HYDROcodone-acetaminophen (LORTAB) 7.5-500 MG per tablet Take 1 tablet by mouth every 6 (six) hours as needed.        [name] ipratropium (ATROVENT) 0.02 % nebulizer solution Take 500 mcg by nebulization 4 (four) times daily.        [name] KLOR-CON M20 20 MEQ tablet TAKE 1 TABLET BY MOUTH TWICE A DAY  60 tablet  2  . nebivolol (BYSTOLIC) 10 MG tablet Take 1 tablet (10 mg total) by mouth 2 (two) times daily.      . nitroGLYCERIN (NITRO-DUR) 0.2 mg/hr Place 1 patch (0.2 mg total) onto the skin daily.  30 patch  3  . Olmesartan-Amlodipine-HCTZ (TRIBENZOR) 40-5-25 MG TABS Take 1 tablet by mouth daily.  30 tablet    . rosuvastatin (CRESTOR) 40 MG tablet Take 1 tablet (40 mg total) by mouth daily.  30 tablet  3  . traMADol (ULTRAM) 50 MG tablet Take 1 tablet (50 mg total) by mouth 4 (four) times daily. 1-2 every 4 hours as needed for cough or pain  120 tablet  0  . warfarin (COUMADIN) 5 MG tablet 5mg  daily except 2.5mg  on Tuesdays, Thursdays and Saturdays or as directed by  anticoagulation clinic  45 tablet  2     Past Medical History  Diagnosis Date  . Atrial fibrillation   . Coronary atherosclerosis of native coronary artery     Nonobstructive, LVEF 60%  . COPD (chronic obstructive pulmonary disease)   . Mixed hyperlipidemia   . Essential hypertension, benign   . Myocardial infarction   . Tachycardia-bradycardia syndrome   . Embolism - blood clot     ROS:   All systems reviewed and negative except as noted in the HPI.   Past Surgical History  Procedure Date  . Appendectomy   . Total abdominal hysterectomy   . Insert / replace / remove pacemaker 3/11     [employer] - Dr. [name]     Family History  Problem Relation Age of Onset  . Hypertension Mother   . Emphysema Brother     smoker  . Asthma Father   . Asthma Brother   . Lung cancer Paternal Grandmother     was a smoker     History   Social History  . Marital Status: Widowed    Spouse Name: N/A    Number of Children: 1  . Years  of Education: N/A   Occupational History  . Unemployed    Social History Main Topics  . Smoking status: Former Smoker -- 2.0 packs/day for 43 years    Types: Cigarettes    Quit date: 09/26/2011  . Smokeless tobacco: Never Used  . Alcohol Use: No  . Drug Use: No  . Sexually Active: Not on file   Other Topics Concern  . Not on file   Social History Narrative  . No narrative on file     BP 138/76  Pulse 77  Resp 18  Ht 5\' 2"  (1.575 m)  Wt 85.276 kg (188 lb)  BMI 34.39 kg/m2  Physical Exam:  Obese appearing middle aged woman NAD HEENT: Unremarkable Neck:  No JVD, no thyromegally Lungs:  Clear with no wheezes. HEART:  Regular rate rhythm, no murmurs, no rubs, no clicks Abd:  soft, positive bowel sounds, no organomegally, no rebound, no guarding Ext:  2 plus pulses, no edema, no cyanosis, no clubbing Skin:  No rashes no nodules Neuro:  CN II through XII intact, motor grossly intact  DEVICE  Normal device function.  See PaceArt  for details.   Assess/Plan:

## 2011-11-07 NOTE — Patient Instructions (Signed)
Your physician recommends that you schedule a follow-up appointment in: 6 months for device check and in 1 year with Dr. Taylor  

## 2011-11-08 ENCOUNTER — Encounter: Payer: Self-pay | Admitting: Internal Medicine

## 2011-11-09 ENCOUNTER — Other Ambulatory Visit: Payer: Self-pay | Admitting: *Deleted

## 2011-11-09 MED ORDER — ROSUVASTATIN CALCIUM 40 MG PO TABS: 40.0000 mg | ORAL_TABLET | Freq: Every day | ORAL | Status: DC

## 2011-11-12 ENCOUNTER — Other Ambulatory Visit: Payer: Self-pay | Admitting: Cardiology

## 2011-11-19 ENCOUNTER — Telehealth: Payer: Self-pay | Admitting: Internal Medicine

## 2011-11-19 NOTE — Telephone Encounter (Signed)
Pt was last seen by MW on 10/29/11 and started on Tribenzor 40/5/25 and Bystolic 10mg .  Pt scheduled 4 week f/u with PFTs on 12/07/11.  LMOM for  Pt informing her  samples of both meds left at front desk for pt to pick up to last until her upcoming appt with MW, where at that time she can discuss with him staying on meds or not and refills to pharmacy.

## 2011-11-21 ENCOUNTER — Ambulatory Visit (INDEPENDENT_AMBULATORY_CARE_PROVIDER_SITE_OTHER): Payer: Medicare Other | Admitting: *Deleted

## 2011-11-21 DIAGNOSIS — I4891 Unspecified atrial fibrillation: Secondary | ICD-10-CM

## 2011-11-21 DIAGNOSIS — Z7901 Long term (current) use of anticoagulants: Secondary | ICD-10-CM

## 2011-12-06 ENCOUNTER — Telehealth: Payer: Self-pay | Admitting: Internal Medicine

## 2011-12-06 DIAGNOSIS — M25519 Pain in unspecified shoulder: Secondary | ICD-10-CM

## 2011-12-06 MED ORDER — OLMESARTAN-AMLODIPINE-HCTZ 40-5-25 MG PO TABS: 1.0000 | ORAL_TABLET | Freq: Every day | ORAL | Status: DC

## 2011-12-06 MED ORDER — NEBIVOLOL HCL 10 MG PO TABS: 10.0000 mg | ORAL_TABLET | Freq: Two times a day (BID) | ORAL | Status: DC

## 2011-12-06 NOTE — Telephone Encounter (Signed)
Pt aware refills have been sent to her pharmacy and to keep ov on 01/03/12 with Mw. Pt verbalized understanding.

## 2011-12-07 ENCOUNTER — Ambulatory Visit: Payer: Medicare Other | Admitting: Internal Medicine

## 2011-12-19 ENCOUNTER — Ambulatory Visit (INDEPENDENT_AMBULATORY_CARE_PROVIDER_SITE_OTHER): Payer: Medicare Other | Admitting: *Deleted

## 2011-12-19 DIAGNOSIS — I4891 Unspecified atrial fibrillation: Secondary | ICD-10-CM

## 2011-12-19 DIAGNOSIS — Z7901 Long term (current) use of anticoagulants: Secondary | ICD-10-CM

## 2011-12-31 ENCOUNTER — Other Ambulatory Visit: Payer: Self-pay | Admitting: *Deleted

## 2011-12-31 MED ORDER — POTASSIUM CHLORIDE CRYS ER 20 MEQ PO TBCR: 20.0000 meq | EXTENDED_RELEASE_TABLET | Freq: Every day | ORAL | Status: DC

## 2012-01-03 ENCOUNTER — Ambulatory Visit: Payer: Medicare Other | Admitting: Internal Medicine

## 2012-01-08 ENCOUNTER — Other Ambulatory Visit (HOSPITAL_COMMUNITY): Payer: Self-pay | Admitting: Family Medicine

## 2012-01-08 DIAGNOSIS — Z139 Encounter for screening, unspecified: Secondary | ICD-10-CM

## 2012-01-21 ENCOUNTER — Ambulatory Visit (INDEPENDENT_AMBULATORY_CARE_PROVIDER_SITE_OTHER): Payer: Medicare Other | Admitting: *Deleted

## 2012-01-21 DIAGNOSIS — Z7901 Long term (current) use of anticoagulants: Secondary | ICD-10-CM

## 2012-01-21 DIAGNOSIS — I4891 Unspecified atrial fibrillation: Secondary | ICD-10-CM

## 2012-01-21 DIAGNOSIS — I495 Sick sinus syndrome: Secondary | ICD-10-CM

## 2012-01-21 LAB — POCT INR: INR: 1.6

## 2012-01-24 ENCOUNTER — Ambulatory Visit (HOSPITAL_COMMUNITY): Payer: Medicare Other

## 2012-01-28 ENCOUNTER — Ambulatory Visit (HOSPITAL_COMMUNITY)
Admission: RE | Admit: 2012-01-28 | Discharge: 2012-01-28 | Disposition: A | Discharge status: Home / Self Care | Payer: Medicare Other | Source: Ambulatory Visit | Attending: Family Medicine | Admitting: Family Medicine

## 2012-01-28 DIAGNOSIS — Z139 Encounter for screening, unspecified: Secondary | ICD-10-CM

## 2012-01-28 DIAGNOSIS — Z1231 Encounter for screening mammogram for malignant neoplasm of breast: Secondary | ICD-10-CM | POA: Insufficient documentation

## 2012-02-04 ENCOUNTER — Other Ambulatory Visit: Payer: Self-pay | Admitting: Internal Medicine

## 2012-02-05 ENCOUNTER — Other Ambulatory Visit: Payer: Self-pay | Admitting: Internal Medicine

## 2012-02-05 MED ORDER — FLECAINIDE ACETATE 100 MG PO TABS: 100.0000 mg | ORAL_TABLET | Freq: Two times a day (BID) | ORAL | Status: DC

## 2012-02-06 ENCOUNTER — Ambulatory Visit (INDEPENDENT_AMBULATORY_CARE_PROVIDER_SITE_OTHER)
Admission: RE | Admit: 2012-02-06 | Discharge: 2012-02-06 | Disposition: A | Discharge status: Home / Self Care | Payer: Medicare Other | Source: Ambulatory Visit | Attending: Internal Medicine | Admitting: Internal Medicine

## 2012-02-06 ENCOUNTER — Ambulatory Visit (INDEPENDENT_AMBULATORY_CARE_PROVIDER_SITE_OTHER): Payer: Medicare Other | Admitting: Internal Medicine

## 2012-02-06 ENCOUNTER — Encounter: Payer: Self-pay | Admitting: Internal Medicine

## 2012-02-06 VITALS — BP 128/82 | HR 96 | Temp 98.3°F | Ht 62.0 in | Wt 196.0 lb

## 2012-02-06 DIAGNOSIS — R918 Other nonspecific abnormal finding of lung field: Secondary | ICD-10-CM

## 2012-02-06 DIAGNOSIS — F17200 Nicotine dependence, unspecified, uncomplicated: Secondary | ICD-10-CM

## 2012-02-06 DIAGNOSIS — I10 Essential (primary) hypertension: Secondary | ICD-10-CM

## 2012-02-06 DIAGNOSIS — J449 Chronic obstructive pulmonary disease, unspecified: Secondary | ICD-10-CM

## 2012-02-06 LAB — PULMONARY FUNCTION TEST

## 2012-02-06 NOTE — Patient Instructions (Signed)
You must committ to no smoking at all for 2 straight weeks before considering any surgery  Please remember to go to the x-ray department downstairs for your tests - we will call you with the results when they are available.

## 2012-02-06 NOTE — Assessment & Plan Note (Signed)
>   3 min discussion  I reviewed the Flethcher curve with patient that basically indicates  if you quit smoking when your best day FEV1 is still well preserved(which hers turns out to be) it is highly unlikely you will progress to severe disease and informed the patient there was no medication on the market that has proven to change the curve or the likelihood of progression.  Therefore stopping smoking and maintaining abstinence is the most important aspect of care, not choice of inhalers or for that matter, doctors.

## 2012-02-06 NOTE — Progress Notes (Signed)
Subjective:    Patient ID: [name], female    DOB: 1951-06-24, 61 y.o.   MRN: [id number]  HPI  76 yowf smoker GOLD III COPD  referred by Dr Diona Browner 09/28/2011 for eval of MPN's by CT 09/25/11   09/28/2011 1st pulmonary eval cc acute onset L shoulder pain and Ant CP x 6 weeks gradually worse only some better x p 7.5 mg of Hydrocodone,  Somewhat positional, worse with coughing, worse with abduction of L arm,  Assoc  with more productive cough x sev months > clear mucus, no blood.  Also now worse Doe x 50 feet.  Sleeps on L side x years but horizontal - breathing much better p neb.   rec Please remember to go to the  x-ray department downstairs for your tests - we will call you with the results when they are available.  Congratulations on stopping smoking  Please see patient coordinator before you leave today  to schedule a PET scan > only POS RML/RLL  Start edarby/hct one daily in place of lisinopril and hydrochlorothiazide  Take Tramadol 50 mg 4 x daily and supplement with your pain medication as needed   10/29/2011 f/u ov/[name] cc cough much better until caught a cold 7 d Prior to day of OV  And smoking again, no purulent sputum or hemoptysis. No change in L shoulder pain, remains positional, some better on tramadol but needing to take it qid. Needing also lots of albuterol for breathing "helps some" rec Stop norvasc edarby and lopressor  Start Tribenzor daily and bystolic 10 mg twice daily in their place  Please see patient coordinator before you leave today  to schedule orthopedic evaluation for your L Shoulder  Please schedule a follow up office visit in 4 weeks, sooner if needed with PFTs    02/06/2012 f/u ov/[name] still smoking cc worse doe assoc with nasal congestion/itching sneezing x one month and pain across upper post chest comes and goes positional, bilateral, fleeting for a min or two then gone, extending from L to R across midline.  No increase in mild am cough >  mucoid sputum only, no hemoptysis. No increase daytime saba need.  Sleeping ok without nocturnal  or early am exacerbation  of respiratory  c/o's or need for noct saba. Also denies any obvious fluctuation of symptoms with weather or environmental changes or other aggravating or alleviating factors except as outlined above    ROS  At present neg for  any significant sore throat, dysphagia, itching, sneezing,   fever, chills, sweats, unintended wt loss, classic pleuritic or exertional cp, orthopnea pnd or leg swelling.  Also denies presyncope, palpitations, heartburn, abdominal pain, nausea, vomiting, diarrhea  or change in bowel or urinary habits, dysuria,hematuria,  rash, arthralgias, visual complaints, headache, numbness weakness or ataxia.            Objective:   Physical Exam Anxious amb wf nad with no longer pseudowheeze Wt  198 09/28/2011 >   02/06/2012  196  HEENT mild turbinate edema.  Oropharynx no thrush or excess pnd or cobblestoning.  No JVD or cervical adenopathy. Mild accessory muscle hypertrophy. Trachea midline, nl thryroid. Chest was hyperinflated by percussion with diminished breath sounds and moderate increased exp time without wheeze. Hoover sign positive at mid inspiration. Regular rate and rhythm without murmur gallop or rub or increase P2 or edema.  Abd: no hsm, nl excursion. Ext warm without cyanosis or clubbing.  MS Pos L shoulder pain worse with abd  beyond 60degrees with mod crepitance.   CXR  09/28/2011 :  New ( since 12/10/09)  Conspicuous Right middle lobe nodule which may represent a primary or secondary malignancy. Additional nodules are not well evaluated on the chest x-ray.   CXR  02/06/2012 :   Interval enlargement of mass lesion within the posterior aspect of the right middle lobe. Stable appearance of small area of nodularity projecting over the upper mid thoracic spine area. Hyperinflation configuration consistent with COPD.        Assessment & Plan:

## 2012-02-06 NOTE — Assessment & Plan Note (Addendum)
-   PFT's 09/28/2011  FEV1  .72 (32%) ratio 41    -  PFT's 02/06/2012      FEV1  1.23 (60%) ratio 49 and no better p B2, DLC0 48% corrects to 75%    - Try off ACEI  09/28/2011 >>> pseudowheeze resolved  Marked improvement from previous studies, now off ACEI and  still significant copd though technically only GOLD II, she could tolerate a RMLobectomy if this lesion is resectable for cure but must stop smoking for 2 weeks preop if wants to be cleared by pulmonary for possible resection.

## 2012-02-06 NOTE — Assessment & Plan Note (Signed)
-   See CT Chest 09/26/11 (not present on plain cxr from 12/10/09 and seen on plain cxr 09/28/2011 )    - PET Scan 10/05/11 1. Hypermetabolic right middle lobe lung nodule, highly suspicious for primary bronchogenic carcinoma. 2. No evidence of thoracic nodal metastasis. 3. Right lower lobe mixed ground-glass and soft tissue nodule is mildly hypermetabolic. Suspicious for a synchronous primary bronchogenic carcinoma. Metastasis could look similar. 4. Other smaller pulmonary nodules are below the resolution of PET.  May now be a candidate for RMlobectomy though concerned if any growth in any of the other nodules, whether mets or second primaries, [name] could not tolerate and probably would not benefit from bilobectomy on R  Discussed in detail all the  indications, usual  risks and alternatives  relative to the benefits with patient who agrees to proceed with repeat CT and then consider T surgery eval if can stop smoking for 2 weeks preop

## 2012-02-06 NOTE — Assessment & Plan Note (Signed)
Off ACEI  09/28/11 for pseudowheeze > resolved Changed lopressor to bystolic 10/29/11 due to albuterol dependence  Adequate control on present rx, reviewed need to stay off acei and avoid non-specific Beta blockers here given dramatic overall improvement in functional status.

## 2012-02-06 NOTE — Progress Notes (Signed)
PFT done today. 

## 2012-02-07 ENCOUNTER — Telehealth: Payer: Self-pay | Admitting: Internal Medicine

## 2012-02-07 DIAGNOSIS — R911 Solitary pulmonary nodule: Secondary | ICD-10-CM

## 2012-02-07 NOTE — Telephone Encounter (Signed)
Spoke with pt and notified of results per Dr. Sherene Sires. Pt verbalized understanding and denied any questions. Order sent to [facility] for ct chest to be scheduled and I advised pt will call her with the results of this once MW reviews.

## 2012-02-07 NOTE — Progress Notes (Signed)
Quick Note:  Spoke with pt and notified of results per Dr. Wert. Pt verbalized understanding and denied any questions.  ______ 

## 2012-02-08 ENCOUNTER — Other Ambulatory Visit: Payer: Medicare Other

## 2012-02-11 ENCOUNTER — Other Ambulatory Visit: Payer: Self-pay | Admitting: Internal Medicine

## 2012-02-11 ENCOUNTER — Encounter: Payer: Self-pay | Admitting: Internal Medicine

## 2012-02-11 ENCOUNTER — Ambulatory Visit (INDEPENDENT_AMBULATORY_CARE_PROVIDER_SITE_OTHER): Payer: Medicare Other | Admitting: *Deleted

## 2012-02-11 ENCOUNTER — Ambulatory Visit (INDEPENDENT_AMBULATORY_CARE_PROVIDER_SITE_OTHER)
Admission: RE | Admit: 2012-02-11 | Discharge: 2012-02-11 | Disposition: A | Discharge status: Home / Self Care | Payer: Medicare Other | Source: Ambulatory Visit | Attending: Internal Medicine | Admitting: Internal Medicine

## 2012-02-11 DIAGNOSIS — Z7901 Long term (current) use of anticoagulants: Secondary | ICD-10-CM

## 2012-02-11 DIAGNOSIS — I4891 Unspecified atrial fibrillation: Secondary | ICD-10-CM

## 2012-02-11 DIAGNOSIS — R911 Solitary pulmonary nodule: Secondary | ICD-10-CM

## 2012-02-11 MED ORDER — PREDNISONE 10 MG PO TABS: ORAL_TABLET | ORAL | Status: DC

## 2012-02-11 MED ORDER — DOXYCYCLINE HYCLATE 100 MG PO TABS: 100.0000 mg | ORAL_TABLET | Freq: Two times a day (BID) | ORAL | Status: AC

## 2012-02-12 NOTE — Telephone Encounter (Signed)
Dr. Sherene Sires, is this okay to refill or should she f/u with PCP for refills on BP meds from now on? Pls advise, thanks!

## 2012-02-18 ENCOUNTER — Ambulatory Visit (INDEPENDENT_AMBULATORY_CARE_PROVIDER_SITE_OTHER): Payer: Medicare Other | Admitting: *Deleted

## 2012-02-18 DIAGNOSIS — I4891 Unspecified atrial fibrillation: Secondary | ICD-10-CM

## 2012-02-18 DIAGNOSIS — Z7901 Long term (current) use of anticoagulants: Secondary | ICD-10-CM

## 2012-02-19 ENCOUNTER — Telehealth: Payer: Self-pay | Admitting: Internal Medicine

## 2012-02-19 NOTE — Telephone Encounter (Signed)
I spoke with Maralyn Sago and she states that pt may not be able to do bronch tomorrow bc she may not have a ride but is going to try to find one. Maralyn Sago is going to call pt back about 3:15 to see if pt needs to scheduled this or not. ATC pt but NA and unable to leave VM. WCB

## 2012-02-19 NOTE — Telephone Encounter (Signed)
Maralyn Sago called pt to discuss pre-op w/ pt regarding bronch.  Pt stated she was not notified by our office of this.  Maralyn Sago asks that we call the pt as well as her at [phone].  Maralyn Sago stated that Marcelino Duster will answer & ask to be transferred to [facility].  Bronch is set for 02/20/2012.

## 2012-02-19 NOTE — Telephone Encounter (Signed)
Discussed with pt, rescheduled for 5/117

## 2012-02-19 NOTE — Telephone Encounter (Signed)
Dr. Sherene Sires do you want this pt to have bronch tomorrow? According to last phone note the pt was to have CT and then you would review and decide on biopsy. Bronch is scheduled for tomorrow and they called to advise the pt of instructions, but the pt was not aware this was to be done. Please advise Carron Curie, CMA

## 2012-02-20 ENCOUNTER — Encounter (HOSPITAL_COMMUNITY): Payer: Medicare Other

## 2012-02-22 ENCOUNTER — Ambulatory Visit (HOSPITAL_COMMUNITY): Payer: Medicare Other

## 2012-02-22 ENCOUNTER — Encounter (HOSPITAL_COMMUNITY)
Admission: RE | Disposition: A | Discharge status: Home / Self Care | Payer: Self-pay | Source: Ambulatory Visit | Attending: Internal Medicine

## 2012-02-22 ENCOUNTER — Ambulatory Visit (HOSPITAL_COMMUNITY)
Admission: RE | Admit: 2012-02-22 | Discharge: 2012-02-22 | Disposition: A | Discharge status: Home / Self Care | Payer: Medicare Other | Source: Ambulatory Visit | Attending: Internal Medicine | Admitting: Internal Medicine

## 2012-02-22 DIAGNOSIS — J4489 Other specified chronic obstructive pulmonary disease: Secondary | ICD-10-CM | POA: Insufficient documentation

## 2012-02-22 DIAGNOSIS — R031 Nonspecific low blood-pressure reading: Secondary | ICD-10-CM | POA: Insufficient documentation

## 2012-02-22 DIAGNOSIS — I10 Essential (primary) hypertension: Secondary | ICD-10-CM | POA: Insufficient documentation

## 2012-02-22 DIAGNOSIS — J449 Chronic obstructive pulmonary disease, unspecified: Secondary | ICD-10-CM | POA: Insufficient documentation

## 2012-02-22 DIAGNOSIS — R222 Localized swelling, mass and lump, trunk: Secondary | ICD-10-CM | POA: Insufficient documentation

## 2012-02-22 DIAGNOSIS — R918 Other nonspecific abnormal finding of lung field: Secondary | ICD-10-CM

## 2012-02-22 DIAGNOSIS — R911 Solitary pulmonary nodule: Secondary | ICD-10-CM | POA: Insufficient documentation

## 2012-02-22 HISTORY — PX: VIDEO BRONCHOSCOPY: SHX5072

## 2012-02-22 HISTORY — PX: LUNG BIOPSY: SHX232

## 2012-02-22 SURGERY — BRONCHOSCOPY, WITH FLUOROSCOPY
Anesthesia: Moderate Sedation | Laterality: Bilateral

## 2012-02-22 MED ORDER — MIDAZOLAM HCL 10 MG/2ML IJ SOLN
INTRAMUSCULAR | Status: DC | PRN
  Administered 2012-02-22 (×2): 2.5 mg via INTRAVENOUS

## 2012-02-22 MED ORDER — SODIUM CHLORIDE 0.9 % IV BOLUS (SEPSIS): 500.0000 mL | Freq: Once | INTRAVENOUS | Status: DC

## 2012-02-22 MED ORDER — ALBUTEROL SULFATE (5 MG/ML) 0.5% IN NEBU
2.5000 mg | INHALATION_SOLUTION | Freq: Once | RESPIRATORY_TRACT | Status: AC
  Administered 2012-02-22: 2.5 mg via RESPIRATORY_TRACT

## 2012-02-22 MED ORDER — MEPERIDINE HCL 25 MG/ML IJ SOLN
INTRAMUSCULAR | Status: DC | PRN
  Administered 2012-02-22: 25 mg via INTRAVENOUS

## 2012-02-22 MED ORDER — MIDAZOLAM HCL 5 MG/ML IJ SOLN: 1.0000 mg | Freq: Once | INTRAMUSCULAR | Status: DC

## 2012-02-22 MED ORDER — MEPERIDINE HCL 100 MG/ML IJ SOLN: 100.0000 mg | Freq: Once | INTRAMUSCULAR | Status: DC

## 2012-02-22 MED ORDER — MIDAZOLAM HCL 10 MG/2ML IJ SOLN
INTRAMUSCULAR | Status: AC
  Filled 2012-02-22: qty 4

## 2012-02-22 MED ORDER — MEPERIDINE HCL 100 MG/ML IJ SOLN
INTRAMUSCULAR | Status: AC
  Filled 2012-02-22: qty 2

## 2012-02-22 MED ORDER — SODIUM CHLORIDE 0.9 % IV SOLN: INTRAVENOUS | Status: DC

## 2012-02-22 MED ORDER — IPRATROPIUM BROMIDE 0.02 % IN SOLN
0.5000 mg | Freq: Once | RESPIRATORY_TRACT | Status: AC
  Administered 2012-02-22: 0.5 mg via RESPIRATORY_TRACT

## 2012-02-22 MED ORDER — PHENYLEPHRINE HCL 0.25 % NA SOLN
NASAL | Status: DC | PRN
  Administered 2012-02-22: 2 via NASAL

## 2012-02-22 MED ORDER — LIDOCAINE HCL 2 % EX GEL
CUTANEOUS | Status: DC | PRN
  Administered 2012-02-22: 1

## 2012-02-22 MED ORDER — LIDOCAINE HCL 1 % IJ SOLN
INTRAMUSCULAR | Status: DC | PRN
  Administered 2012-02-22: 6 mL via RESPIRATORY_TRACT

## 2012-02-22 NOTE — Discharge Instructions (Signed)
Bronchoscopy Care After These instructions give you information on caring for yourself after your procedure. Your doctor may also give you specific instructions. Call your doctor if you have any problems or questions after your procedure. HOME CARE  Do not eat or drink anything for 2 hours after your test. Your nose and throat was numbed by medicine. If you try to eat or drink before the medicine wears off, food or drink could go into your lungs.   For the rest of the first day, eat soft food and drink liquids slowly.   On the day after the test, you can go back to eating your usual food.   Do not drive or sign important papers the day of the test.   Take it easy for the next 2 days. Do not do any heavy work, exercise, or activities.   Only take medicine as told by your doctor. Do not take aspirin.   You may be drowsy for the next 24 hours.   You may see traces of blood in your spit for 1 to 2 days.  Finding out the results of your test Ask when your test results will be ready. Make sure you get your test results. GET HELP RIGHT AWAY IF:  You have breathing problems.   You have a bad sore throat for more than 1 week.   You see traces of blood in your spit for more than 3 days.   You start coughing up blood.   You have a temperature of 102 F (38.9 C) or higher.  MAKE SURE YOU:  Understand these instructions.   Will watch your condition.   Will get help right away if you are not doing well or get worse.  Document Released: 07/22/2009 Document Revised: 09/13/2011 Document Reviewed: 07/22/2009 [facility] Patient Information 2012 [place], [place].  Hold aspirin and coumadin until Sunday 5/19 then resume as before but hold for any bleeding

## 2012-02-22 NOTE — Progress Notes (Signed)
Increased FIO2 to 6 liters

## 2012-02-22 NOTE — Op Note (Signed)
Bronchoscopy Procedure Note  Date of Operation: 02/22/2012  Pre-op Diagnosis: RML Mass  Post-op Diagnosis: Same  Surgeon: [name]  Anesthesia: Monitored Local Anesthesia with Sedation  Operation: Video Flexible fiberoptic bronchoscopy, diagnostic   Findings: moderate bilateral retained secretions  Specimen: RML lavage, TBBX  Estimated Blood Loss: Minimal  Complications: Transient hypotension, responded to NS bolus post op   Indications and History: The patient is a 61 y.o. female with Multiple RLL RML nodules.  The risks, benefits, complications, treatment options and expected outcomes were discussed with the patient.  The possibilities of reaction to medication, pulmonary aspiration, perforation of a viscus, bleeding, failure to diagnose a condition and creating a complication requiring transfusion or operation were discussed with the patient who freely signed the consent.    Description of Procedure: The patient was re-examined in the bronchoscopy suite and the site of surgery properly noted/marked.  The patient was identified as [name] and the procedure verified as Flexible Fiberoptic Bronchoscopy.  A Time Out was held and the above information confirmed.   After the induction of topical nasopharyngeal anesthesia, the patient was positioned  and the bronchoscope was passed through the R naris. The vocal cords were visualized and  1% buffered lidocaine 5 ml was topically placed onto the cords. The cords were nl. The scope was then passed into the trachea.  1% buffered lidocaine 5 ml was used topically and the entire tracheobronchial tree was explored bilaterally to the subsegmental level  Findings:  Moderate mucoid secretion retention diffusely. No endobronchial lesions   Procedure:  BAL of RML for cytology  Procedure #2 Video guided Transbronchial bx RML nodule with only tangential bx obtainable adjacent to the mass seen on fluoro.  4 bx's obtained and sent for  surgical pathology.    The Patient was taken to the Endoscopy Recovery area in satisfactory condition but required 1 liter NS bolus for post op hypotension and responded well.  Will hold asa and coumadin until 5/19 or all bleeding stops, whichever comes first.  Attestation: I performed the procedure.   [name], MD Pulmonary and Critical Care Medicine [facility] Cell [phone]

## 2012-02-22 NOTE — Progress Notes (Signed)
Cuff off trying to obtain an IV

## 2012-02-22 NOTE — H&P (Signed)
Kari Hughs, MD 02/06/2012 11:25 PM Signed  Subjective:   Patient ID: [name], female DOB: August 22, 1951, 61 y.o. MRN: [id number]  HPI  89 yowf smoker GOLD III COPD referred by Dr Diona Browner 09/28/2011 for eval of MPN's by CT 09/25/11  09/28/2011 1st pulmonary eval cc acute onset L shoulder pain and Ant CP x 6 weeks gradually worse only some better x p 7.5 mg of Hydrocodone, Somewhat positional, worse with coughing, worse with abduction of L arm, Assoc with more productive cough x sev months > clear mucus, no blood. Also now worse Doe x 50 feet. Sleeps on L side x years but horizontal - breathing much better p neb.  rec  Please remember to go to the x-ray department downstairs for your tests - we will call you with the results when they are available.  Congratulations on stopping smoking  Please see patient coordinator before you leave today to schedule a PET scan > only POS RML/RLL  Start edarby/hct one daily in place of lisinopril and hydrochlorothiazide  Take Tramadol 50 mg 4 x daily and supplement with your pain medication as needed  10/29/2011 f/u ov/[name] cc cough much better until caught a cold 7 d Prior to day of OV And smoking again, no purulent sputum or hemoptysis. No change in L shoulder pain, remains positional, some better on tramadol but needing to take it qid. Needing also lots of albuterol for breathing "helps some"  rec  Stop norvasc edarby and lopressor  Start Tribenzor daily and bystolic 10 mg twice daily in their place  Please see patient coordinator before you leave today to schedule orthopedic evaluation for your L Shoulder  Please schedule a follow up office visit in 4 weeks, sooner if needed with PFTs  02/06/2012 f/u ov/[name] still smoking cc worse doe assoc with nasal congestion/itching sneezing x one month and pain across upper post chest comes and goes positional, bilateral, fleeting for a min or two then gone, extending from L to R across midline. No increase in mild am  cough > mucoid sputum only, no hemoptysis. No increase daytime saba need.  Sleeping ok without nocturnal or early am exacerbation of respiratory c/o's or need for noct saba. Also denies any obvious fluctuation of symptoms with weather or environmental changes or other aggravating or alleviating factors except as outlined above  ROS At present neg for any significant sore throat, dysphagia, itching, sneezing, fever, chills, sweats, unintended wt loss, classic pleuritic or exertional cp, orthopnea pnd or leg swelling. Also denies presyncope, palpitations, heartburn, abdominal pain, nausea, vomiting, diarrhea or change in bowel or urinary habits, dysuria,hematuria, rash, arthralgias, visual complaints, headache, numbness weakness or ataxia.  Objective:   Physical Exam  Anxious amb wf nad with no longer pseudowheeze  Wt 198 09/28/2011 > 02/06/2012 196  HEENT mild turbinate edema. Oropharynx no thrush or excess pnd or cobblestoning. No JVD or cervical adenopathy. Mild accessory muscle hypertrophy. Trachea midline, nl thryroid. Chest was hyperinflated by percussion with diminished breath sounds and moderate increased exp time without wheeze. Hoover sign positive at mid inspiration. Regular rate and rhythm without murmur gallop or rub or increase P2 or edema. Abd: no hsm, nl excursion. Ext warm without cyanosis or clubbing. MS Pos L shoulder pain worse with abd beyond 60degrees with mod crepitance.  CXR 09/28/2011 :  New ( since 12/10/09) Conspicuous Right middle lobe nodule which may represent a primary or secondary malignancy. Additional nodules are not well evaluated on the chest x-ray.  CXR  02/06/2012 :  Interval enlargement of mass lesion within the posterior aspect of the right middle lobe. Stable appearance of small area of nodularity projecting over the upper mid thoracic spine area. Hyperinflation configuration consistent with COPD.  Assessment & Plan:    COPD (chronic obstructive pulmonary disease)  - Kari Hughs, MD 02/06/2012 11:18 PM Addendum  - PFT's 09/28/2011 FEV1 .72 (32%) ratio 41  - PFT's 02/06/2012 FEV1 1.23 (60%) ratio 49 and no better p B2, DLC0 48% corrects to 75%  - Try off ACEI 09/28/2011 >>> pseudowheeze resolved  Marked improvement from previous studies, now off ACEI and still significant copd though technically only GOLD II, she could tolerate a RMLobectomy if this lesion is resectable for cure but must stop smoking for 2 weeks preop if wants to be cleared by pulmonary for possible resection. Previous Version  Pulmonary nodules - Kari Hughs, MD 02/06/2012 11:20 PM Signed  - See CT Chest 09/26/11 (not present on plain cxr from 12/10/09 and seen on plain cxr 09/28/2011 )  - PET Scan 10/05/11 1. Hypermetabolic right middle lobe lung nodule, highly suspicious for primary bronchogenic carcinoma. 2. No evidence of thoracic nodal metastasis. 3. Right lower lobe mixed ground-glass and soft tissue nodule is mildly hypermetabolic. Suspicious for a synchronous primary bronchogenic carcinoma. Metastasis could look similar. 4. Other smaller pulmonary nodules are below the resolution of PET.  May now be a candidate for RMlobectomy though concerned if any growth in any of the other nodules, whether mets or second primaries, she could not tolerate and probably would not benefit from bilobectomy on R  Discussed in detail all the indications, usual risks and alternatives relative to the benefits with patient who agrees to proceed with repeat CT and then consider T surgery eval if can stop smoking for 2 weeks preop Smoker - Kari Hughs, MD 02/06/2012 11:22 PM Signed  > 3 min discussion  I reviewed the Flethcher curve with patient that basically indicates if you quit smoking when your best day FEV1 is still well preserved(which hers turns out to be) it is highly unlikely you will progress to severe disease and informed the patient there was no medication on the market that has proven to change the  curve or the likelihood of progression. Therefore stopping smoking and maintaining abstinence is the most important aspect of care, not choice of inhalers or for that matter, doctors.  Essential hypertension, benign - Kari Hughs, MD 02/06/2012 11:24 PM Signed  Off ACEI 09/28/11 for pseudowheeze > resolved  Changed lopressor to bystolic 10/29/11 due to albuterol dependence  Adequate control on present rx, reviewed need to stay off acei and avoid non-specific Beta blockers here given dramatic overall improvement in functional status.

## 2012-02-22 NOTE — Progress Notes (Signed)
Video Bronchoscopy Procedure  Intervention washing done Intervention Biopsy done  Procedure Tolerated well

## 2012-02-22 NOTE — OR Nursing (Signed)
Dr Sherene Sires aware of BP 77/45. 500cc fluid bolus of N.S. Started per M.D.

## 2012-02-25 ENCOUNTER — Telehealth: Payer: Self-pay | Admitting: Internal Medicine

## 2012-02-25 ENCOUNTER — Encounter (HOSPITAL_COMMUNITY): Payer: Self-pay | Admitting: Internal Medicine

## 2012-02-25 NOTE — Telephone Encounter (Signed)
LMTCBx1.Byrd Terrero, CMA  

## 2012-02-26 ENCOUNTER — Encounter: Payer: Self-pay | Admitting: Internal Medicine

## 2012-02-26 MED ORDER — ALBUTEROL SULFATE HFA 108 (90 BASE) MCG/ACT IN AERS: 2.0000 | INHALATION_SPRAY | Freq: Four times a day (QID) | RESPIRATORY_TRACT | Status: DC | PRN

## 2012-02-26 NOTE — Telephone Encounter (Signed)
Will resume bystolic. Changed combivent to proair prn  Fu w/in one month to decide re: bx

## 2012-02-26 NOTE — Telephone Encounter (Signed)
lmomtcb x 2  

## 2012-02-26 NOTE — Telephone Encounter (Signed)
Correction bystolic is just once daily

## 2012-02-26 NOTE — Telephone Encounter (Signed)
Patient returning call.

## 2012-02-26 NOTE — Telephone Encounter (Signed)
Called and spoke with pt. She states that she just read her instructions p bronch and they advised her to stop taking her bystolic. She just realized this last night, and so did not take med last night or today. She is asking when to start this back. She also states since bronch, she has noticed streaks of blood in her sputum. Started off dark red and now bright red. She states sometimes sputum is completely clear. She started back on blood thinners 02-24-12. Please advise thanks

## 2012-02-26 NOTE — Telephone Encounter (Signed)
Ok to restart

## 2012-02-26 NOTE — Telephone Encounter (Signed)
Dr. Sherene Sires, does pt need to be concerned about the blood?

## 2012-02-27 ENCOUNTER — Other Ambulatory Visit (HOSPITAL_COMMUNITY): Payer: Self-pay | Admitting: Respiratory Therapy

## 2012-02-28 ENCOUNTER — Ambulatory Visit (INDEPENDENT_AMBULATORY_CARE_PROVIDER_SITE_OTHER): Payer: Medicare Other | Admitting: *Deleted

## 2012-02-28 DIAGNOSIS — I4891 Unspecified atrial fibrillation: Secondary | ICD-10-CM

## 2012-02-28 DIAGNOSIS — Z7901 Long term (current) use of anticoagulants: Secondary | ICD-10-CM

## 2012-02-28 LAB — POCT INR: INR: 1.6

## 2012-03-10 ENCOUNTER — Ambulatory Visit (INDEPENDENT_AMBULATORY_CARE_PROVIDER_SITE_OTHER): Payer: Medicare Other | Admitting: *Deleted

## 2012-03-10 DIAGNOSIS — I4891 Unspecified atrial fibrillation: Secondary | ICD-10-CM

## 2012-03-10 DIAGNOSIS — Z7901 Long term (current) use of anticoagulants: Secondary | ICD-10-CM

## 2012-03-10 LAB — POCT INR: INR: 2.1

## 2012-03-27 ENCOUNTER — Encounter: Payer: Self-pay | Admitting: Internal Medicine

## 2012-03-27 ENCOUNTER — Ambulatory Visit (INDEPENDENT_AMBULATORY_CARE_PROVIDER_SITE_OTHER): Payer: Medicare Other | Admitting: Internal Medicine

## 2012-03-27 VITALS — BP 104/56 | HR 65 | Temp 98.1°F | Ht 62.0 in | Wt 194.8 lb

## 2012-03-27 DIAGNOSIS — R918 Other nonspecific abnormal finding of lung field: Secondary | ICD-10-CM

## 2012-03-27 DIAGNOSIS — F17200 Nicotine dependence, unspecified, uncomplicated: Secondary | ICD-10-CM

## 2012-03-27 DIAGNOSIS — J449 Chronic obstructive pulmonary disease, unspecified: Secondary | ICD-10-CM

## 2012-03-27 NOTE — Assessment & Plan Note (Signed)
Strongly rec commitment to completely quit

## 2012-03-27 NOTE — Assessment & Plan Note (Signed)
-   PFT's 09/28/2011  FEV1  .72 (32%) ratio 41    -  PFT's 02/06/2012      FEV1  1.23 (60%) ratio 49 and no better p B2, DLC0 48% corrects to 75%    - Try off ACEI  09/28/2011 >>> pseudowheeze resolved  GOLD II with sign doe and still smoking, discussed separately.

## 2012-03-27 NOTE — Progress Notes (Signed)
Subjective:    Patient ID: [name], female    DOB: 16-Feb-1951, 61 y.o.   MRN: [id number]  HPI  22 yowf smoker GOLD III COPD  referred by Dr Diona Browner 09/28/2011 for eval of MPN's by CT 09/25/11   09/28/2011 1st pulmonary eval cc acute onset L shoulder pain and Ant CP x 6 weeks gradually worse only some better x p 7.5 mg of Hydrocodone,  Somewhat positional, worse with coughing, worse with abduction of L arm,  Assoc  with more productive cough x sev months > clear mucus, no blood.  Also now worse Doe x 50 feet.  Sleeps on L side x years but horizontal - breathing much better p neb.   rec Please remember to go to the  x-ray department downstairs for your tests - we will call you with the results when they are available.  Congratulations on stopping smoking  Please see patient coordinator before you leave today  to schedule a PET scan > only POS RML/RLL  Start edarby/hct one daily in place of lisinopril and hydrochlorothiazide  Take Tramadol 50 mg 4 x daily and supplement with your pain medication as needed   10/29/2011 f/u ov/[name] cc cough much better until caught a cold 7 d Prior to day of OV  And smoking again, no purulent sputum or hemoptysis. No change in L shoulder pain, remains positional, some better on tramadol but needing to take it qid. Needing also lots of albuterol for breathing "helps some" rec Stop norvasc edarby and lopressor  Start Tribenzor daily and bystolic 10 mg twice daily in their place  Please see patient coordinator before you leave today  to schedule orthopedic evaluation for your L Shoulder    02/06/2012 f/u ov/[name] still smoking cc worse doe assoc with nasal congestion/itching sneezing x one month and pain across upper post chest comes and goes positional, bilateral, fleeting for a min or two then gone, extending from L to R across midline.  No increase in mild am cough > mucoid sputum only, no hemoptysis. No increase daytime saba need. rec You must  committ to no smoking at all for 2 straight weeks before considering any surgery   FOB 02/22/12 technically difficult anatomy for tbbx RML, path and cyt completely neg.  03/27/2012 f/u ov/[name] re MPN cc doe x one flight of steps or carrying anything like groceries,  occ min hemoptysis since bx on coumadin for afib. No excess or easy bruising.   Sleeping ok without nocturnal  or early am exacerbation  of respiratory  c/o's or need for noct saba. Also denies any obvious fluctuation of symptoms with weather or environmental changes or other aggravating or alleviating factors except as outlined above    ROS  At present neg for  any significant sore throat, dysphagia, itching, sneezing,   fever, chills, sweats, unintended wt loss, classic pleuritic or exertional cp, orthopnea pnd or leg swelling.  Also denies presyncope, palpitations, heartburn, abdominal pain, nausea, vomiting, diarrhea  or change in bowel or urinary habits, dysuria,hematuria,  rash, arthralgias, visual complaints, headache, numbness weakness or ataxia.            Objective:   Physical Exam Anxious amb wf nad with no longer pseudowheeze Wt  198 09/28/2011 >   02/06/2012  196 > 03/27/2012  194  HEENT mild turbinate edema.  Oropharynx no thrush or excess pnd or cobblestoning.  No JVD or cervical adenopathy. Mild accessory muscle hypertrophy. Trachea midline, nl thryroid. Chest was hyperinflated  by percussion with diminished breath sounds and moderate increased exp time without wheeze. Hoover sign positive at mid inspiration. Regular rate and rhythm without murmur gallop or rub or increase P2 or edema.  Abd: no hsm, nl excursion. Ext warm without cyanosis or clubbing.  MS Pos L shoulder pain worse with abd beyond 60degrees with mod crepitance.   CXR  09/28/2011 :  New ( since 12/10/09)  Conspicuous Right middle lobe nodule which may represent a primary or secondary malignancy. Additional nodules are not well evaluated on the  chest x-ray.   CXR  02/06/2012 :   Interval enlargement of mass lesion within the posterior aspect of the right middle lobe. Stable appearance of small area of nodularity projecting over the upper mid thoracic spine area. Hyperinflation configuration consistent with COPD.        Assessment & Plan:

## 2012-03-27 NOTE — Patient Instructions (Addendum)
The key is to stop smoking completely before smoking completely stops you!   If you notice significant coughing up blood, stop the blood thinner   I will call you with a recommendation from our interventionalist (Dr Herma Carson)  to see whether we can do the biopsies you need without major surgery

## 2012-03-27 NOTE — Assessment & Plan Note (Signed)
-   See CT Chest 09/26/11 (not present on plain cxr from 12/10/09 and seen on plain cxr 09/28/2011 )    - PET Scan 10/05/11 1. Hypermetabolic right middle lobe lung nodule, highly suspicious for primary bronchogenic carcinoma. 2. No evidence of thoracic nodal metastasis. 3. Right lower lobe mixed ground-glass and soft tissue nodule is mildly hypermetabolic. Suspicious for a synchronous primary bronchogenic carcinoma. Metastasis could look similar. - CT chest 02/11/2012 > all nodules have increased in size c/w 2 synchronous or met ca, either way not resectable based on severity of copd  - Fob for bx 02/22/12 > nl airways, bx techniqually difficult> neg path, neg cytology > pt informed 02/26/2012   Since these are all new from 2011 most likely she has met ca or multiple primaries but needs tissue dx   I had an extended discussion with the patient today lasting 15 to 20 minutes of a 25 minute visit on the following issues:   wiill have Dr Herma Carson review re whether EBUS feasible to increase yield or whether she should have T surgery eval at this point

## 2012-03-28 ENCOUNTER — Other Ambulatory Visit: Payer: Self-pay | Admitting: Internal Medicine

## 2012-03-28 DIAGNOSIS — R918 Other nonspecific abnormal finding of lung field: Secondary | ICD-10-CM

## 2012-04-03 ENCOUNTER — Ambulatory Visit: Payer: Medicare Other | Admitting: Cardiology

## 2012-04-03 ENCOUNTER — Ambulatory Visit (INDEPENDENT_AMBULATORY_CARE_PROVIDER_SITE_OTHER): Payer: Medicare Other | Admitting: *Deleted

## 2012-04-03 DIAGNOSIS — I4891 Unspecified atrial fibrillation: Secondary | ICD-10-CM

## 2012-04-03 DIAGNOSIS — Z7901 Long term (current) use of anticoagulants: Secondary | ICD-10-CM

## 2012-04-05 ENCOUNTER — Other Ambulatory Visit: Payer: Self-pay | Admitting: Internal Medicine

## 2012-04-07 ENCOUNTER — Other Ambulatory Visit: Payer: Self-pay | Admitting: Cardiology

## 2012-04-07 MED ORDER — WARFARIN SODIUM 5 MG PO TABS: ORAL_TABLET | ORAL | Status: DC

## 2012-04-08 NOTE — Progress Notes (Signed)
This is in review in radiology and pt will call them back when ready to schedule this [name]

## 2012-04-22 ENCOUNTER — Telehealth: Payer: Self-pay | Admitting: Cardiology

## 2012-04-22 ENCOUNTER — Ambulatory Visit (INDEPENDENT_AMBULATORY_CARE_PROVIDER_SITE_OTHER): Payer: Medicare Other | Admitting: Adult Health

## 2012-04-22 ENCOUNTER — Encounter: Payer: Self-pay | Admitting: Adult Health

## 2012-04-22 ENCOUNTER — Other Ambulatory Visit: Payer: Self-pay | Admitting: Internal Medicine

## 2012-04-22 VITALS — BP 118/60 | HR 78 | Ht 62.0 in | Wt 194.8 lb

## 2012-04-22 DIAGNOSIS — I10 Essential (primary) hypertension: Secondary | ICD-10-CM

## 2012-04-22 DIAGNOSIS — I4891 Unspecified atrial fibrillation: Secondary | ICD-10-CM

## 2012-04-22 NOTE — Progress Notes (Signed)
HPI: [name] is a 61 y/o patient  of Dr.Taylor we are following for ongoing assessment and treatment of symptomatic bradycardia,s/p PPM (St. Jude), hypertension, PAF  and recently diagnosed lung CA. She is due for biopsy per radiology next week. She is aware that she will need to stop coumadin prior to procedure. She complains of hypotension and dizziness in the morning hours but feels fine in the rest of the day. She denies chest pain or DOE. She is nervous about pending biopsy.   Allergies  Allergen Reactions  . Latex     "pulls skin off"    Current Outpatient Prescriptions  Medication Sig Dispense Refill  . albuterol (PROAIR HFA) 108 (90 BASE) MCG/ACT inhaler Inhale 2 puffs into the lungs every 6 (six) hours as needed for wheezing.  1 Inhaler  0  . albuterol (PROVENTIL) (2.5 MG/3ML) 0.083% nebulizer solution Take 2.5 mg by nebulization every 6 (six) hours as needed.        [name] aspirin 81 MG tablet Take 81 mg by mouth daily.        . flecainide (TAMBOCOR) 100 MG tablet Take 1 tablet (100 mg total) by mouth 2 (two) times daily.  60 tablet  5  . HYDROcodone-acetaminophen (LORTAB) 7.5-500 MG per tablet Take 1 tablet by mouth every 6 (six) hours as needed.        [name] ipratropium (ATROVENT) 0.02 % nebulizer solution Take 500 mcg by nebulization 4 (four) times daily as needed.       . nebivolol (BYSTOLIC) 10 MG tablet Take 10 mg by mouth daily.      . potassium chloride SA (KLOR-CON M20) 20 MEQ tablet Take 1 tablet (20 mEq total) by mouth daily.  60 tablet  2  . rosuvastatin (CRESTOR) 40 MG tablet Take 1 tablet (40 mg total) by mouth daily.  30 tablet  6  . traMADol (ULTRAM) 50 MG tablet Take 1 tablet (50 mg total) by mouth 4 (four) times daily. 1-2 every 4 hours as needed for cough or pain  120 tablet  0  . TRIBENZOR 40-5-25 MG TABS take 1 tablet once daily  30 tablet  5  . warfarin (COUMADIN) 5 MG tablet 5mg  daily except 2.5mg  on Tuesdays, Thursdays and Saturdays or as directed by  anticoagulation clinic  45 tablet  2    Past Medical History  Diagnosis Date  . Atrial fibrillation   . Coronary atherosclerosis of native coronary artery     Nonobstructive, LVEF 60%  . COPD (chronic obstructive pulmonary disease)   . Mixed hyperlipidemia   . Essential hypertension, benign   . Myocardial infarction   . Tachycardia-bradycardia syndrome   . Embolism - blood clot     Past Surgical History  Procedure Date  . Appendectomy   . Total abdominal hysterectomy   . Insert / replace / remove pacemaker 3/11     St. Jude - Dr. [name]  . Video bronchoscopy 02/22/2012    Procedure: VIDEO BRONCHOSCOPY WITH FLUORO;  Surgeon: Nyoka Cowden, MD;  Location: [facility];  Service: Cardiopulmonary;  Laterality: Bilateral;    MVH:QIONGE of systems complete and found to be negative unless listed above  PHYSICAL EXAM BP 118/60  Pulse 78  Ht 5\' 2"  (1.575 m)  Wt 194 lb 12.8 oz (88.361 kg)  BMI 35.63 kg/m2  General: Well developed, well nourished, in no acute distress, obsese Head: Eyes PERRLA, No xanthomas.   Normal cephalic and atramatic  Lungs: Clear bilaterally to auscultation  and percussion. Heart: HRRR S1 S2, without MRG.  Pulses are 2+ & equal.            No carotid bruit. No JVD.  No abdominal bruits. No femoral bruits. Abdomen: Bowel sounds are positive, abdomen soft and non-tender without masses or                  Hernia's noted. Msk:  Back normal, normal gait. Normal strength and tone for age. Extremities: No clubbing, cyanosis or edema.  DP +1 Neuro: Alert and oriented X 3. Psych:  Good affect, responds appropriately    ASSESSMENT AND PLAN

## 2012-04-22 NOTE — Assessment & Plan Note (Signed)
She is on a nitroglycerine patch over night, which was place over a year ago when she was in [facility] hospital for chest pain. I am going to stop this, as she is having hypotension and dizziness in the morning and even after taking off the patch. She is not having chest pain. This should be helpful with her symptoms.

## 2012-04-22 NOTE — Assessment & Plan Note (Signed)
Rate is controlled. She is already set up to see Vashti Hey RN, for coumadin follow up after biopsy and will follow her closely for therapeutic dosing. No other changes in medications.

## 2012-04-22 NOTE — Telephone Encounter (Signed)
Scheduled for lung Bx on 7/23.  Was told to take last dose of coumadin on 7/18.  After procedure she will resume coumadn 1 tablet x 3 days then resume current dose.  Pt verbalized understanding.

## 2012-04-22 NOTE — Patient Instructions (Signed)
Your physician has recommended you make the following change in your medication: stop taking Nitroglycerin  Your physician recommends that you schedule a follow-up appointment in: 6 months

## 2012-04-22 NOTE — Telephone Encounter (Signed)
Patient is having a biopsy done(Dr. Sherene Sires) on 04/24/2012 and needs coumadin to be stopped.  Please send instruction regarding coumadin dose and this procedure.  Fax:  [phone] Phone:  [phone]

## 2012-04-23 ENCOUNTER — Other Ambulatory Visit: Payer: Self-pay | Admitting: Physician Assistant

## 2012-04-29 ENCOUNTER — Encounter (HOSPITAL_COMMUNITY): Payer: Self-pay

## 2012-04-29 ENCOUNTER — Ambulatory Visit (HOSPITAL_COMMUNITY)
Admission: RE | Admit: 2012-04-29 | Discharge: 2012-04-29 | Disposition: A | Discharge status: Home / Self Care | Payer: Medicare Other | Source: Ambulatory Visit | Attending: Internal Medicine | Admitting: Internal Medicine

## 2012-04-29 ENCOUNTER — Ambulatory Visit (HOSPITAL_COMMUNITY)
Admission: RE | Admit: 2012-04-29 | Discharge: 2012-04-29 | Disposition: A | Discharge status: Home / Self Care | Payer: Medicare Other | Source: Ambulatory Visit | Attending: Diagnostic Radiology | Admitting: Diagnostic Radiology

## 2012-04-29 DIAGNOSIS — J4489 Other specified chronic obstructive pulmonary disease: Secondary | ICD-10-CM | POA: Insufficient documentation

## 2012-04-29 DIAGNOSIS — R918 Other nonspecific abnormal finding of lung field: Secondary | ICD-10-CM

## 2012-04-29 DIAGNOSIS — J449 Chronic obstructive pulmonary disease, unspecified: Secondary | ICD-10-CM | POA: Insufficient documentation

## 2012-04-29 DIAGNOSIS — Z86718 Personal history of other venous thrombosis and embolism: Secondary | ICD-10-CM | POA: Insufficient documentation

## 2012-04-29 DIAGNOSIS — I10 Essential (primary) hypertension: Secondary | ICD-10-CM | POA: Insufficient documentation

## 2012-04-29 DIAGNOSIS — C3430 Malignant neoplasm of lower lobe, unspecified bronchus or lung: Secondary | ICD-10-CM | POA: Insufficient documentation

## 2012-04-29 DIAGNOSIS — E782 Mixed hyperlipidemia: Secondary | ICD-10-CM | POA: Insufficient documentation

## 2012-04-29 DIAGNOSIS — I252 Old myocardial infarction: Secondary | ICD-10-CM | POA: Insufficient documentation

## 2012-04-29 DIAGNOSIS — I2510 Atherosclerotic heart disease of native coronary artery without angina pectoris: Secondary | ICD-10-CM | POA: Insufficient documentation

## 2012-04-29 DIAGNOSIS — I4891 Unspecified atrial fibrillation: Secondary | ICD-10-CM | POA: Insufficient documentation

## 2012-04-29 HISTORY — PX: LUNG BIOPSY: SHX232

## 2012-04-29 LAB — CBC
HCT: 38.5 % (ref 36.0–46.0)
MCHC: 33.8 g/dL (ref 30.0–36.0)
Platelets: 184 10*3/uL (ref 150–400)
RDW: 14.6 % (ref 11.5–15.5)
WBC: 12.3 10*3/uL — ABNORMAL HIGH (ref 4.0–10.5)

## 2012-04-29 LAB — PROTIME-INR: INR: 1.08 (ref 0.00–1.49)

## 2012-04-29 LAB — APTT: aPTT: 34 seconds (ref 24–37)

## 2012-04-29 MED ORDER — ALBUTEROL SULFATE (5 MG/ML) 0.5% IN NEBU
INHALATION_SOLUTION | RESPIRATORY_TRACT | Status: AC
  Filled 2012-04-29: qty 0.5

## 2012-04-29 MED ORDER — FENTANYL CITRATE 0.05 MG/ML IJ SOLN
INTRAMUSCULAR | Status: AC
  Filled 2012-04-29: qty 2

## 2012-04-29 MED ORDER — MIDAZOLAM HCL 2 MG/2ML IJ SOLN
INTRAMUSCULAR | Status: AC
  Filled 2012-04-29: qty 6

## 2012-04-29 MED ORDER — FENTANYL CITRATE 0.05 MG/ML IJ SOLN
INTRAMUSCULAR | Status: AC
  Filled 2012-04-29: qty 4

## 2012-04-29 MED ORDER — SODIUM CHLORIDE 0.9 % IV SOLN
INTRAVENOUS | Status: DC
  Administered 2012-04-29: 08:00:00 via INTRAVENOUS

## 2012-04-29 MED ORDER — FENTANYL CITRATE 0.05 MG/ML IJ SOLN
INTRAMUSCULAR | Status: AC | PRN
  Administered 2012-04-29: 50 ug via INTRAVENOUS
  Administered 2012-04-29: 100 ug via INTRAVENOUS
  Administered 2012-04-29: 50 ug via INTRAVENOUS

## 2012-04-29 MED ORDER — HYDROCODONE-ACETAMINOPHEN 5-325 MG PO TABS: 1.0000 | ORAL_TABLET | ORAL | Status: DC | PRN

## 2012-04-29 MED ORDER — MIDAZOLAM HCL 5 MG/5ML IJ SOLN
INTRAMUSCULAR | Status: AC | PRN
  Administered 2012-04-29 (×2): 1 mg via INTRAVENOUS

## 2012-04-29 MED ORDER — ALBUTEROL SULFATE (5 MG/ML) 0.5% IN NEBU
2.5000 mg | INHALATION_SOLUTION | Freq: Once | RESPIRATORY_TRACT | Status: AC
  Administered 2012-04-29: 2.5 mg via RESPIRATORY_TRACT
  Filled 2012-04-29: qty 0.5

## 2012-04-29 NOTE — Progress Notes (Signed)
CXR report "negative for pneumothorax" called to Dr. Lowella Dandy. Ok for patient now to eat per order.

## 2012-04-29 NOTE — Progress Notes (Signed)
Patient states she feels "much better" after breathing treatment. VS 109/82, 60 HR, respirations 18, sats 95% on room air. No wheezing. Respiratory therapy has administered breathing treatment. Paged Dr.  Lowella Dandy at 1255 and told him patient is feeling much better and he stated she may be discharged. Home with sister in law now.

## 2012-04-29 NOTE — H&P (Signed)
[name] is an 61 y.o. female.   Chief Complaint: chest pain in November known right lung masses x 8 months; enlarging Bronchoscopy in 5/13; negative; "unsuccessful to reach lesion" HPI: afib - off coumadin x 1 week; CAD/MI; COPD; HTN  Past Medical History  Diagnosis Date  . Atrial fibrillation   . Coronary atherosclerosis of native coronary artery     Nonobstructive, LVEF 60%  . COPD (chronic obstructive pulmonary disease)   . Mixed hyperlipidemia   . Essential hypertension, benign   . Myocardial infarction   . Tachycardia-bradycardia syndrome   . Embolism - blood clot     Past Surgical History  Procedure Date  . Appendectomy   . Total abdominal hysterectomy   . Insert / replace / remove pacemaker 3/11     [employer] - Dr. [name]  . Video bronchoscopy 02/22/2012    Procedure: VIDEO BRONCHOSCOPY WITH FLUORO;  Surgeon: Nyoka Cowden, MD;  Location: [facility];  Service: Cardiopulmonary;  Laterality: Bilateral;    Family History  Problem Relation Age of Onset  . Hypertension Mother   . Emphysema Brother     smoker  . Asthma Father   . Asthma Brother   . Lung cancer Paternal Grandmother     was a smoker   Social History:  reports that she has been smoking Cigarettes.  She has a 21.5 pack-year smoking history. She has never used smokeless tobacco. She reports that she does not drink alcohol or use illicit drugs.  Allergies:  Allergies  Allergen Reactions  . Latex     "pulls skin off"     (Not in a hospital admission)  No results found for this or any previous visit (from the past 48 hour(s)). No results found.  Review of Systems  Constitutional: Negative for fever.  Respiratory: Positive for shortness of breath.   Cardiovascular: Positive for chest pain.  Gastrointestinal: Negative for nausea and vomiting.  Musculoskeletal: Positive for back pain.  Neurological: Negative for headaches.  Psychiatric/Behavioral: The patient is nervous/anxious.     Blood  pressure 126/59, pulse 70, temperature 98.6 F (37 C), temperature source Oral, resp. rate 16, height 5\' 2"  (1.575 m), weight 195 lb (88.451 kg), SpO2 98.00%. Physical Exam  Constitutional: She is oriented to person, place, and time. She appears well-developed and well-nourished.  Cardiovascular: Normal rate, regular rhythm and normal heart sounds.   No murmur heard. Respiratory: Effort normal and breath sounds normal. She has no wheezes.  GI: Soft. Bowel sounds are normal. There is no tenderness.  Musculoskeletal: Normal range of motion.       Bilat weak knees  Neurological: She is alert and oriented to person, place, and time.  Skin: Skin is warm and dry.  Psychiatric: She has a normal mood and affect. Her behavior is normal. Judgment and thought content normal.     Assessment/Plan Pt developed chest pain in Nov 2012; workup showed pulm nodules in Rt lung Noted enlargement in 5/13: bronchoscopy: negative Scheduled now for biopsy of Rt lung mass Pt aware of procedure benefits and risks and agreeable to proceed. Consent in chart  [name] A 04/29/2012, 8:08 AM

## 2012-04-29 NOTE — Progress Notes (Signed)
At 1205 patient ready for discharge and was out of breath/"winded" sitting on side of bed after getting dressed. Stated that is "normal" for her as her breathing treatment from this am was wearing off. Dr. Lowella Dandy walking by and assessed patient. RN ambulated patient in hall. VS 130/44 HR 66 and sats 94%. Sitting on side of bed and states she feels like she needs breathing treatment. MD aware and ordered. Respiratory called and coming to give patient treatment shortly. Currently patient states she feels much better and back to baseline. Continue to monitor.

## 2012-04-29 NOTE — Procedures (Signed)
CT-guided core biopsies of right lower lobe nodule.  3 cores obtained and no immediate complication.

## 2012-04-30 ENCOUNTER — Encounter: Payer: Self-pay | Admitting: Internal Medicine

## 2012-05-01 ENCOUNTER — Telehealth: Payer: Self-pay | Admitting: Internal Medicine

## 2012-05-01 DIAGNOSIS — C3490 Malignant neoplasm of unspecified part of unspecified bronchus or lung: Secondary | ICD-10-CM

## 2012-05-01 NOTE — Telephone Encounter (Signed)
Make sure we have started the process to refer her to oncology asap for Stage IV Lung Ca

## 2012-05-01 NOTE — Telephone Encounter (Signed)
Dr. Sherene Sires did you call this patient. She can be reached at the number listed above. Carron Curie, CMA

## 2012-05-01 NOTE — Telephone Encounter (Signed)
Referral faxed to oncology they will contact pt with this appt [name]

## 2012-05-02 ENCOUNTER — Telehealth: Payer: Self-pay | Admitting: Internal Medicine

## 2012-05-02 ENCOUNTER — Telehealth: Payer: Self-pay | Admitting: *Deleted

## 2012-05-02 NOTE — Telephone Encounter (Signed)
S/w pt re appt for 8/7 @ 9:30 am.

## 2012-05-02 NOTE — Telephone Encounter (Signed)
Spoke with pt regarding appt for MTOC 8/1.  She stated she is unable to make any day but Wednesday.  I will rescheudle

## 2012-05-05 ENCOUNTER — Telehealth: Payer: Self-pay | Admitting: Internal Medicine

## 2012-05-05 NOTE — Telephone Encounter (Signed)
Referred by Dr. Sherene Sires Dx-Lung. NP packet mailed out.

## 2012-05-07 ENCOUNTER — Ambulatory Visit (INDEPENDENT_AMBULATORY_CARE_PROVIDER_SITE_OTHER): Payer: Medicare Other | Admitting: *Deleted

## 2012-05-07 DIAGNOSIS — I4891 Unspecified atrial fibrillation: Secondary | ICD-10-CM

## 2012-05-07 DIAGNOSIS — Z7901 Long term (current) use of anticoagulants: Secondary | ICD-10-CM

## 2012-05-08 ENCOUNTER — Telehealth: Payer: Self-pay | Admitting: Internal Medicine

## 2012-05-08 ENCOUNTER — Ambulatory Visit (INDEPENDENT_AMBULATORY_CARE_PROVIDER_SITE_OTHER): Payer: Medicare Other | Admitting: *Deleted

## 2012-05-08 ENCOUNTER — Encounter: Payer: Self-pay | Admitting: Internal Medicine

## 2012-05-08 DIAGNOSIS — I495 Sick sinus syndrome: Secondary | ICD-10-CM

## 2012-05-08 LAB — PACEMAKER DEVICE OBSERVATION
AL AMPLITUDE: 5 mv
AL THRESHOLD: 0.5 V
ATRIAL PACING PM: 72
BAMS-0001: 180 {beats}/min
DEVICE MODEL PM: 7115296
RV LEAD IMPEDENCE PM: 575 Ohm
RV LEAD THRESHOLD: 1 V
VENTRICULAR PACING PM: 1

## 2012-05-08 MED ORDER — TRAMADOL HCL 50 MG PO TABS: 50.0000 mg | ORAL_TABLET | Freq: Four times a day (QID) | ORAL | Status: AC | PRN

## 2012-05-08 NOTE — Telephone Encounter (Signed)
Called, spoke with pt who states Dr. Sherene Sires asked her to call back if she needed any pain medication as she was recently dx with ca. Pt states she is having 6-7/10 sharp pain in the center of right back; is worse when breathing in.  Pt states she still has to work and would like something to help with the pain while working and qhs.  Reports she has been taking 1/2 tab of a relative's oxycodone 10/325 qd around 10:30 am -- this "dulls" the pain during the day stops working qhs.  Dr. Sherene Sires, pls advise.  Thank you.  [facility] [place] [place]

## 2012-05-08 NOTE — Telephone Encounter (Signed)
Tramadol 50 mg qid prn - needs to let her treating doctors take it from there #120

## 2012-05-08 NOTE — Telephone Encounter (Signed)
Refill sent. Pt is aware. Monta Maiorana, CMA  

## 2012-05-08 NOTE — Progress Notes (Signed)
PPM check 

## 2012-05-13 ENCOUNTER — Other Ambulatory Visit: Payer: Self-pay | Admitting: Medical Oncology

## 2012-05-13 DIAGNOSIS — C3490 Malignant neoplasm of unspecified part of unspecified bronchus or lung: Secondary | ICD-10-CM

## 2012-05-14 ENCOUNTER — Other Ambulatory Visit (HOSPITAL_BASED_OUTPATIENT_CLINIC_OR_DEPARTMENT_OTHER): Payer: Medicare Other | Admitting: Lab

## 2012-05-14 ENCOUNTER — Ambulatory Visit (HOSPITAL_BASED_OUTPATIENT_CLINIC_OR_DEPARTMENT_OTHER): Payer: Medicare Other | Admitting: Internal Medicine

## 2012-05-14 ENCOUNTER — Telehealth: Payer: Self-pay | Admitting: Internal Medicine

## 2012-05-14 ENCOUNTER — Ambulatory Visit: Payer: Medicare Other

## 2012-05-14 ENCOUNTER — Encounter: Payer: Self-pay | Admitting: *Deleted

## 2012-05-14 VITALS — BP 127/74 | HR 83 | Temp 97.4°F | Resp 20 | Ht 62.0 in | Wt 187.8 lb

## 2012-05-14 DIAGNOSIS — C342 Malignant neoplasm of middle lobe, bronchus or lung: Secondary | ICD-10-CM

## 2012-05-14 DIAGNOSIS — F17200 Nicotine dependence, unspecified, uncomplicated: Secondary | ICD-10-CM

## 2012-05-14 DIAGNOSIS — C3490 Malignant neoplasm of unspecified part of unspecified bronchus or lung: Secondary | ICD-10-CM

## 2012-05-14 DIAGNOSIS — J449 Chronic obstructive pulmonary disease, unspecified: Secondary | ICD-10-CM

## 2012-05-14 DIAGNOSIS — I4891 Unspecified atrial fibrillation: Secondary | ICD-10-CM

## 2012-05-14 LAB — CBC WITH DIFFERENTIAL/PLATELET
BASO%: 0.4 % (ref 0.0–2.0)
EOS%: 1.1 % (ref 0.0–7.0)
LYMPH%: 10.9 % — ABNORMAL LOW (ref 14.0–49.7)
MCHC: 34.1 g/dL (ref 31.5–36.0)
MCV: 91.8 fL (ref 79.5–101.0)
MONO%: 6.4 % (ref 0.0–14.0)
Platelets: 168 10*3/uL (ref 145–400)
RBC: 4.19 10*6/uL (ref 3.70–5.45)
RDW: 14.3 % (ref 11.2–14.5)

## 2012-05-14 LAB — COMPREHENSIVE METABOLIC PANEL
ALT: 16 U/L (ref 0–35)
AST: 18 U/L (ref 0–37)
Alkaline Phosphatase: 52 U/L (ref 39–117)
Calcium: 8.9 mg/dL (ref 8.4–10.5)
Potassium: 3.7 mEq/L (ref 3.5–5.3)
Sodium: 139 mEq/L (ref 135–145)
Total Bilirubin: 0.4 mg/dL (ref 0.3–1.2)
Total Protein: 6.3 g/dL (ref 6.0–8.3)

## 2012-05-14 NOTE — Telephone Encounter (Signed)
appts mdae and printed for pt

## 2012-05-14 NOTE — Progress Notes (Signed)
[facility] [phone]   [phone]  CONSULT NOTE  REASON FOR CONSULTATION:  61 years old white female diagnosed with lung cancer.   HPI [name] is a 61 y.o. 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 The patient also has a long history of smoking. In early December 2013 the patient presented for evaluation of left shoulder pain to her cardiologist.m CT scan of the chest was performed on 09/26/2011 and it showed Bilateral lung nodules. The dominant right middle lobe lung nodule is highly suspicious for primary bronchogenic carcinoma and measured 2.4 x 1.8 CM. A right lower lobe nodule measured 1.6 CM is suspicious for a synchronous primary versus metastatic disease. Other smaller nodules are indeterminate. Less likely, the pulmonary process could all represent metastasis from unknown primary.  A PET scan on 10/11/2011 showed hypermetabolic right middle lobe lung nodule, highly suspicious for primary bronchogenic carcinoma. No evidence of thoracic nodal metastasis. Right lower lobe mixed ground-glass and soft tissue nodule is mildly hypermetabolic. Suspicious for a synchronous primary bronchogenic carcinoma. Metastasis could look similar. Other smaller pulmonary nodules are below the resolution of  PET. Mildly hypermetabolic porta hepatis lymph node. The patient was seen by Dr. Sherene Sires and was followed by observation. Repeat CT scan of the chest on 02/11/2012 showed enlarging right middle and right lower lobe nodules, highly worrisome for synchronous primary bronchogenic carcinomas. On 02/22/2012 the patient underwent a video flexible fiberoptic bronchoscopy under the care of Dr. Sherene Sires but the final pathology was not conclusive for malignancy. On 04/29/2012 the patient underwent  CT-guided fine needle aspiration and core biopsy of the right lower lobe lung nodule by interventional radiology and the final pathology was consistent with invasive well-differentiated adenocarcinoma. The molecular testing of the tumor showed negative EGFR mutation and negative ALK gene translocation. Dr. Sherene Sires kindly referred the patient to me today for further evaluation and recommendation regarding treatment of her condition. When seen today she is very anxious and she continues to have right-sided chest pain especially with deep breaths. She also has shortness of breath at baseline and increased with exertion and cough productive of yellowish sputum. The patient lost around 20 pounds in the last few months. She has occasional visual changes but no headache. She denied having any significant nausea or vomiting and no change in her bowel movement.  Family history significant for a paternal grandmother who was diagnosed with lung cancer. Her mother died from heart attack and father died from surgical complication. The patient is a widow and has one daughter. She used to do office work. She has a history of smoking 1-3 packs per day for the last 43 years and unfortunately she continues to smoke but trying to quit. She has no history of alcohol or drug abuse.   @SFHPI @  Past Medical History  Diagnosis Date  . Atrial fibrillation   . Coronary atherosclerosis of native coronary artery     Nonobstructive, LVEF 60%  . COPD (chronic obstructive pulmonary disease)   . Mixed hyperlipidemia   . Essential hypertension, benign   . Myocardial infarction   . Tachycardia-bradycardia syndrome   . Embolism - blood clot     Past Surgical History  Procedure Date  . Appendectomy   . Total abdominal hysterectomy   . Insert / replace / remove pacemaker 3/11     [employer] Jude - Dr. [name]  .  Video bronchoscopy 02/22/2012    Procedure: VIDEO BRONCHOSCOPY WITH FLUORO;  Surgeon: Nyoka Cowden, MD;  Location: [facility];  Service: Cardiopulmonary;  Laterality: Bilateral;    Family History  Problem Relation Age of Onset  . Hypertension Mother   . Emphysema Brother     smoker  . Asthma Father   . Asthma Brother   . Lung cancer Paternal Grandmother     was a smoker    Social History History  Substance Use Topics  . Smoking status: Current Everyday Smoker -- 0.5 packs/day for 43 years    Types: Cigarettes  . Smokeless tobacco: Never Used  . Alcohol Use: No    Allergies  Allergen Reactions  . Latex     "pulls skin off"    Current Outpatient Prescriptions  Medication Sig Dispense Refill  . albuterol (PROVENTIL) (2.5 MG/3ML) 0.083% nebulizer solution Take 2.5 mg by nebulization every 6 (six) hours as needed.       [name] aspirin 81 MG tablet Take 81 mg by mouth daily.        . flecainide (TAMBOCOR) 100 MG tablet Take 1 tablet (100 mg total) by mouth 2 (two) times daily.  60 tablet  5  . ipratropium (ATROVENT) 0.02 % nebulizer solution Take 500 mcg by nebulization 4 (four) times daily as needed.       . nebivolol (BYSTOLIC) 10 MG tablet Take 10 mg by mouth daily.      . potassium chloride SA (KLOR-CON M20) 20 MEQ tablet Take 1 tablet (20 mEq total) by mouth daily.  60 tablet  2  . PROAIR HFA 108 (90 BASE) MCG/ACT inhaler INHALE 2 PUFFS BY MOUTH EVERY 6 HOURS AS NEEDED FOR WHEEZING  8.5 g  3  . rosuvastatin (CRESTOR) 40 MG tablet Take 1 tablet (40 mg total) by mouth daily.  30 tablet  6  . TRIBENZOR 40-5-25 MG TABS take 1 tablet once daily  30 tablet  5  . warfarin (COUMADIN) 5 MG tablet 5mg  daily except 2.5mg  on Tuesdays, Thursdays and Saturdays or as directed by anticoagulation clinic  45 tablet  2  . traMADol (ULTRAM) 50 MG tablet Take 1 tablet (50 mg total) by mouth 4 (four) times daily as needed for pain.  120 tablet  0    Review of Systems  A comprehensive review of systems was negative except for: Constitutional: positive for fatigue and weight loss Respiratory: positive for cough,  dyspnea on exertion, emphysema, pleurisy/chest pain and sputum  Physical Exam  KVQ:QVZDG, healthy, no distress, well nourished and well developed SKIN: skin color, texture, turgor are normal HEAD: Normocephalic, No masses, lesions, tenderness or abnormalities EYES: normal EARS: External ears normal OROPHARYNX:no exudate and no erythema  NECK: supple, no adenopathy LYMPH:  no palpable lymphadenopathy, no hepatosplenomegaly BREAST:not examined LUNGS: clear to auscultation  HEART: regular rate & rhythm, no murmurs and no gallops ABDOMEN:abdomen soft, non-tender, obese, normal bowel sounds and no masses or organomegaly BACK: Back symmetric, no curvature. EXTREMITIES:no joint deformities, effusion, or inflammation, no edema, no skin discoloration, no clubbing, no cyanosis  NEURO: alert & oriented x 3 with fluent speech, no focal motor/sensory deficits  PERFORMANCE STATUS: ECOG 1  LABORATORY DATA: Lab Results  Component Value Date   WBC 10.8* 05/14/2012   HGB 13.1 05/14/2012   HCT 38.5 05/14/2012   MCV 91.8 05/14/2012   PLT 168 05/14/2012      Chemistry      Component Value Date/Time   NA 140  09/24/2011 1058   K 4.4 09/24/2011 1058   CL 100 09/24/2011 1058   CO2 30 09/24/2011 1058   BUN 14 09/24/2011 1058   CREATININE 0.80 09/24/2011 1058   CREATININE 0.88 12/05/2009 1812      Component Value Date/Time   CALCIUM 9.7 09/24/2011 1058   ALKPHOS 58 09/19/2009 1909   AST 17 09/19/2009 1909   ALT 19 09/19/2009 1909   BILITOT 0.4 09/19/2009 1909       RADIOGRAPHIC STUDIES: Dg Chest 1 View  04/29/2012  *RADIOLOGY REPORT*  Clinical Data: Status post right lung biopsy.  CHEST - 1 VIEW  Comparison: Cysts plain film chest 02/22/2012 and CT chest 02/11/2012.  Findings: Right middle lobe mass is again seen.  Right lower lobe nodule identified on prior chest CT is not visible on this exam. There is no pneumothorax.  Left lung appears clear.  Heart size is normal.  Pacing device in place.   IMPRESSION: Negative for pneumothorax after biopsy.  Original Report Authenticated By: Bernadene Bell. Maricela Curet, M.D.   Ct Biopsy  04/29/2012  *RADIOLOGY REPORT*  Clinical history:Enlarging right lung nodules are suspicious for malignancy.  PROCEDURE(S): CT GUIDED BIOPSY OF THE RIGHT LOWER LOBE NODULE.  Physician: Rachelle Hora. Henn, MD  Medications:Versed 2 mg, Fentanyl 200 mcg. A radiology nurse monitored the patient for moderate sedation.  Moderate sedation time:15 minutes  Procedure:The procedure was explained to the patient.  The risks and benefits of the procedure were discussed and the patient's questions were addressed.  Informed consent was obtained from the patient.  The patient was placed prone on the CT scanner.  Images through the chest were obtained.  The right side of the back was prepped and draped in a sterile fashion.  The skin was anesthetized with 1% lidocaine.  A 17 gauge needle was directed into the posterior right lower lobe lesion with CT fluoroscopy.  Three core biopsies were obtained with an 18 gauge core device.  Needle was removed without complication.  Findings:There is a semi solid nodule in the medial right lower lobe that measures up to 1.8 cm.  There is also a lobulated lesion measuring up to 4 cm in the right middle lobe which roughly measured 3.5 cm on the previous examination.  There appears to be increased interstitial thickening and nodularity along the lateral aspect the large lesion with a pleural based nodule measuring 0.7 cm.  Previously, this pleural based nodule roughly measured 0.6 cm. The 17 gauge needle was successfully directed into the posterior aspect of the right lower lobe nodule with CT fluoroscopy.  A small amount of parenchymal hemorrhage around the lesion following the core biopsies.  Complications: None  Impression:Successful CT guided core biopsies of the right lower lobe nodule.  The lesions in the right middle lobe appear to have enlarged since 02/11/2012.   Original Report Authenticated By: Richarda Overlie, M.D.    ASSESSMENT: This is a very pleasant 61 years old white female with synchronous primary lung cancer versus metastatic non-small cell lung cancer consistent with adenocarcinoma with negative EGFR mutation and negative ALK gene translocation, presenting with right middle and lower pulmonary nodules.   PLAN: I have a lengthy discussion with the patient today about her current condition. Her last CT scan of the chest was in May of 2013 and last PET scan was in January of 2013. I recommended for her to proceed with a restaging PET scan as well as CT scan of the head to rule out any brain  metastasis. I would see the patient back for followup visit in 2 week for evaluation and discussion of her scan results and recommendation regarding treatment of her condition. If no other evidence for metastatic disease outside the 2 pulmonary nodules in the right lung, the patient may benefit from curative radiotherapy to these nodules followed by adjuvant systemic chemotherapy, but he the patient has evidence for more disease in the stool pulmonary nodules she would be considered for systemic therapy and palliative radiation only if needed. She agreed to the current plan. She was advised to call me immediately if she has any concerning symptoms in the interval.  All questions were answered. The patient knows to call the clinic with any problems, questions or concerns. We can certainly see the patient much sooner if necessary.  Thank you so much for allowing me to participate in the care of [name]. I will continue to follow up the patient with you and assist in her care.  I spent 30 minutes counseling the patient face to face. The total time spent in the appointment was 55 minutes.  Renatta Shrieves K. 05/14/2012, 10:57 AM

## 2012-05-14 NOTE — Progress Notes (Signed)
Spoke with pt at [facility] today.  Information/resource information given and explained.  Smoking cessation discussed with resource information given.

## 2012-05-19 ENCOUNTER — Other Ambulatory Visit: Payer: Self-pay | Admitting: Internal Medicine

## 2012-05-21 ENCOUNTER — Encounter (HOSPITAL_COMMUNITY)
Admission: RE | Admit: 2012-05-21 | Discharge: 2012-05-21 | Disposition: A | Discharge status: Home / Self Care | Payer: Medicare Other | Source: Ambulatory Visit | Attending: Internal Medicine | Admitting: Internal Medicine

## 2012-05-21 ENCOUNTER — Other Ambulatory Visit: Payer: Self-pay | Admitting: Internal Medicine

## 2012-05-21 DIAGNOSIS — C3490 Malignant neoplasm of unspecified part of unspecified bronchus or lung: Secondary | ICD-10-CM | POA: Insufficient documentation

## 2012-05-21 DIAGNOSIS — K7689 Other specified diseases of liver: Secondary | ICD-10-CM | POA: Insufficient documentation

## 2012-05-21 DIAGNOSIS — K5730 Diverticulosis of large intestine without perforation or abscess without bleeding: Secondary | ICD-10-CM | POA: Insufficient documentation

## 2012-05-21 DIAGNOSIS — M899 Disorder of bone, unspecified: Secondary | ICD-10-CM | POA: Insufficient documentation

## 2012-05-21 DIAGNOSIS — R918 Other nonspecific abnormal finding of lung field: Secondary | ICD-10-CM | POA: Insufficient documentation

## 2012-05-21 DIAGNOSIS — I2510 Atherosclerotic heart disease of native coronary artery without angina pectoris: Secondary | ICD-10-CM | POA: Insufficient documentation

## 2012-05-21 DIAGNOSIS — Z950 Presence of cardiac pacemaker: Secondary | ICD-10-CM | POA: Insufficient documentation

## 2012-05-21 DIAGNOSIS — I517 Cardiomegaly: Secondary | ICD-10-CM | POA: Insufficient documentation

## 2012-05-21 DIAGNOSIS — M949 Disorder of cartilage, unspecified: Secondary | ICD-10-CM | POA: Insufficient documentation

## 2012-05-21 DIAGNOSIS — J438 Other emphysema: Secondary | ICD-10-CM | POA: Insufficient documentation

## 2012-05-21 LAB — GLUCOSE, CAPILLARY: Glucose-Capillary: 146 mg/dL — ABNORMAL HIGH (ref 70–99)

## 2012-05-21 MED ORDER — IOHEXOL 300 MG/ML  SOLN
100.0000 mL | Freq: Once | INTRAMUSCULAR | Status: AC | PRN
  Administered 2012-05-21: 100 mL via INTRAVENOUS

## 2012-05-21 MED ORDER — FLUDEOXYGLUCOSE F - 18 (FDG) INJECTION
17.1000 | Freq: Once | INTRAVENOUS | Status: AC | PRN
  Administered 2012-05-21: 17.1 via INTRAVENOUS

## 2012-05-27 ENCOUNTER — Encounter: Payer: Self-pay | Admitting: *Deleted

## 2012-05-27 NOTE — Progress Notes (Signed)
Noted EGFR/ALK not detected.  Dr. Arbutus Ped aware

## 2012-05-28 ENCOUNTER — Telehealth: Payer: Self-pay | Admitting: Internal Medicine

## 2012-05-28 ENCOUNTER — Ambulatory Visit (HOSPITAL_BASED_OUTPATIENT_CLINIC_OR_DEPARTMENT_OTHER): Payer: Medicare Other | Admitting: Internal Medicine

## 2012-05-28 VITALS — BP 122/69 | HR 82 | Temp 97.5°F | Resp 18 | Ht 62.0 in | Wt 187.0 lb

## 2012-05-28 DIAGNOSIS — C3490 Malignant neoplasm of unspecified part of unspecified bronchus or lung: Secondary | ICD-10-CM | POA: Insufficient documentation

## 2012-05-28 DIAGNOSIS — R5383 Other fatigue: Secondary | ICD-10-CM

## 2012-05-28 DIAGNOSIS — C342 Malignant neoplasm of middle lobe, bronchus or lung: Secondary | ICD-10-CM

## 2012-05-28 DIAGNOSIS — C3430 Malignant neoplasm of lower lobe, unspecified bronchus or lung: Secondary | ICD-10-CM

## 2012-05-28 NOTE — Progress Notes (Signed)
[facility] [phone]   [phone]  OFFICE PROGRESS NOTE  Ernestine Conrad, MD [facility] [address] 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   PRIOR THERAPY: None  CURRENT THERAPY: None  INTERVAL HISTORY: DEBI COUSIN 61 y.o. female returns to the clinic today for followup visit. The patient is doing fine today with no specific complaints except for generalized fatigue and weakness as well as shortness breath with exertion. She denied having any significant chest pain, cough or hemoptysis. The patient had a PET scan as well as CT of the head performed recently and she is here for evaluation and discussion of her scan results. She denied having any significant weight loss or night sweats.  MEDICAL HISTORY: Past Medical History  Diagnosis Date  . Atrial fibrillation   . Coronary atherosclerosis of native coronary artery     Nonobstructive, LVEF 60%  . COPD (chronic obstructive pulmonary disease)   . Mixed hyperlipidemia   . Essential hypertension, benign   . Myocardial infarction   . Tachycardia-bradycardia syndrome   . Embolism - blood clot     ALLERGIES:  is allergic to latex.  MEDICATIONS:  Current Outpatient Prescriptions  Medication Sig Dispense Refill  . albuterol (PROVENTIL) (2.5 MG/3ML) 0.083% nebulizer solution Take 2.5 mg by nebulization every 6 (six) hours as needed.       [name] aspirin 81 MG tablet Take 81 mg by mouth daily.        [name] BYSTOLIC 10 MG tablet take 1 tablet twice a day  60 tablet  1  . flecainide (TAMBOCOR) 100 MG tablet Take 1 tablet (100 mg total) by mouth 2 (two) times daily.  60 tablet  5  . ipratropium (ATROVENT) 0.02 % nebulizer solution Take 500 mcg by nebulization 4 (four) times daily as needed.       .  potassium chloride SA (KLOR-CON M20) 20 MEQ tablet Take 1 tablet (20 mEq total) by mouth daily.  60 tablet  2  . PROAIR HFA 108 (90 BASE) MCG/ACT inhaler INHALE 2 PUFFS BY MOUTH EVERY 6 HOURS AS NEEDED FOR WHEEZING  8.5 g  3  . rosuvastatin (CRESTOR) 40 MG tablet Take 1 tablet (40 mg total) by mouth daily.  30 tablet  6  . TRIBENZOR 40-5-25 MG TABS take 1 tablet once daily  30 tablet  5  . warfarin (COUMADIN) 5 MG tablet 5mg  daily except 2.5mg  on Tuesdays, Thursdays and Saturdays or as directed by anticoagulation clinic  45 tablet  2  . traMADol (ULTRAM) 50 MG tablet Take 50 mg by mouth Daily.        SURGICAL HISTORY:  Past Surgical History  Procedure Date  . Appendectomy   . Total abdominal hysterectomy   . Insert / replace / remove pacemaker 3/11     [employer] Jude - Dr. [name]  . Video bronchoscopy 02/22/2012    Procedure: VIDEO BRONCHOSCOPY WITH FLUORO;  Surgeon: Nyoka Cowden, MD;  Location: [facility];  Service: Cardiopulmonary;  Laterality: Bilateral;    REVIEW OF SYSTEMS:  A comprehensive review of systems was negative except for: Constitutional: positive for fatigue Respiratory: positive for cough and dyspnea on exertion   PHYSICAL EXAMINATION: General appearance: alert, cooperative and no distress Head: Normocephalic, without obvious abnormality, atraumatic Neck: no  adenopathy Lymph nodes: Cervical, supraclavicular, and axillary nodes normal. Resp: clear to auscultation bilaterally Cardio: regular rate and rhythm, S1, S2 normal, no murmur, click, rub or gallop GI: soft, non-tender; bowel sounds normal; no masses,  no organomegaly Extremities: extremities normal, atraumatic, no cyanosis or edema Neurologic: Alert and oriented X 3, normal strength and tone. Normal symmetric reflexes. Normal coordination and gait  ECOG PERFORMANCE STATUS: 1 - Symptomatic but completely ambulatory  Blood pressure 122/69, pulse 82, temperature 97.5 F (36.4 C), temperature source Oral, resp. rate  18, height 5\' 2"  (1.575 m), weight 187 lb (84.823 kg).  LABORATORY DATA: Lab Results  Component Value Date   WBC 10.8* 05/14/2012   HGB 13.1 05/14/2012   HCT 38.5 05/14/2012   MCV 91.8 05/14/2012   PLT 168 05/14/2012      Chemistry      Component Value Date/Time   NA 139 05/14/2012 0950   K 3.7 05/14/2012 0950   CL 104 05/14/2012 0950   CO2 26 05/14/2012 0950   BUN 15 05/14/2012 0950   CREATININE 0.81 05/14/2012 0950   CREATININE 0.80 09/24/2011 1058      Component Value Date/Time   CALCIUM 8.9 05/14/2012 0950   ALKPHOS 52 05/14/2012 0950   AST 18 05/14/2012 0950   ALT 16 05/14/2012 0950   BILITOT 0.4 05/14/2012 0950       RADIOGRAPHIC STUDIES: Dg Chest 1 View  04/29/2012  *RADIOLOGY REPORT*  Clinical Data: Status post right lung biopsy.  CHEST - 1 VIEW  Comparison: Cysts plain film chest 02/22/2012 and CT chest 02/11/2012.  Findings: Right middle lobe mass is again seen.  Right lower lobe nodule identified on prior chest CT is not visible on this exam. There is no pneumothorax.  Left lung appears clear.  Heart size is normal.  Pacing device in place.  IMPRESSION: Negative for pneumothorax after biopsy.  Original Report Authenticated By: Bernadene Bell. Maricela Curet, M.D.   Ct Head W Wo Contrast  05/21/2012  *RADIOLOGY REPORT*  Clinical Data: Lung cancer with dizziness.  CT HEAD WITHOUT AND WITH CONTRAST  Technique:  Contiguous axial images were obtained from the base of the skull through the vertex without and with intravenous contrast.  Contrast: OMNIPAQUE IOHEXOL 300 MG/ML  SOLN  Comparison: None.  Findings: There is no evidence for acute hemorrhage, hydrocephalus, mass lesion, or abnormal extra-axial fluid collection.  No definite CT evidence for acute infarction.  Imaging after IV contrast administration shows no evidence for enhancing mass lesion.  No abnormal parenchymal enhancement is evident.  The visualized paranasal sinuses and mastoid air cells are clear.  IMPRESSION: Normal exam.  Original Report  Authenticated By: ERIC A. MANSELL, M.D.   Nm Pet Image Restag (ps) Skull Base To Thigh  05/21/2012  *RADIOLOGY REPORT*  Clinical Data: Initial treatment strategy for new diagnosis of right lung cancer.  NUCLEAR MEDICINE PET CT SKULL BASE TO THIGH  Technique:  Technique:  17.1 mCi F-18 FDG was injected intravenously. CT data was obtained and used for attenuation correction and anatomic localization only.  (This was not acquired as a diagnostic CT examination.) Additional exam technical data entered on technologist worksheet.  Comparison: Chest CT of 02/11/2012.  PET of 10/05/2011.  Findings: Neck:  No abnormal hypermetabolism.  Chest:  The right middle lobe lung nodule remains hypermetabolic. It measures 2.5 cm and a S.U.V. max of 14.2 on image 91.  On the prior PET, this measured 2.3 cm and a S.U.V. max of 9.6.  There  is activity within the day right hilum, which measures a S.U.V. max of 4.1.  Example image 76.  No gross adenopathy in this area.  The right lower lobe lung nodule measures 1.6 cm and a S.U.V. max of 2.6 on image 99.  On the prior PET, this measured 1.3 cm and a S.U.V. max of 2.1.  More solid and masslike today.  Abdomen/Pelvis:  A focus of hypermetabolism within the lower rectum/anus, without CT correlate.  Skelton:  No abnormal hypermetabolism.  CT images performed for attenuation correction demonstrate no significant findings within the neck.  Pacer with battery in the left supraclavicular region.  Mild cardiomegaly.  Centrilobular emphysema.  Other pulmonary nodules which are below the resolution of PET. Multivessel coronary artery atherosclerosis.  Mild hepatic steatosis.  Left hepatic lobe cysts. Normal adrenal glands.  Mildly prominent ileocolic mesenteric nodes, likely reactive.  Sigmoid diverticulosis and muscular hypertrophy.  Pelvic floor laxity. Right iliac sclerotic lesion which is likely a bone island. Degenerative sclerosis involves the lower thoracic spine.  Trace L4- L5  anterolisthesis which is likely degenerative.  IMPRESSION:  1.  Findings most consistent with  synchronous primary bronchogenic carcinomas within the right middle and right lower lobes. 2.  Equivocal right hilar hypermetabolism.  Cannot exclude nodal metastasis. 3.  No extrathoracic disease. 4.  Hepatic steatosis.  Original Report Authenticated By: Consuello Bossier, M.D.   Ct Biopsy  04/29/2012  *RADIOLOGY REPORT*  Clinical history:Enlarging right lung nodules are suspicious for malignancy.  PROCEDURE(S): CT GUIDED BIOPSY OF THE RIGHT LOWER LOBE NODULE.  Physician: Rachelle Hora. Henn, MD  Medications:Versed 2 mg, Fentanyl 200 mcg. A radiology nurse monitored the patient for moderate sedation.  Moderate sedation time:15 minutes  Procedure:The procedure was explained to the patient.  The risks and benefits of the procedure were discussed and the patient's questions were addressed.  Informed consent was obtained from the patient.  The patient was placed prone on the CT scanner.  Images through the chest were obtained.  The right side of the back was prepped and draped in a sterile fashion.  The skin was anesthetized with 1% lidocaine.  A 17 gauge needle was directed into the posterior right lower lobe lesion with CT fluoroscopy.  Three core biopsies were obtained with an 18 gauge core device.  Needle was removed without complication.  Findings:There is a semi solid nodule in the medial right lower lobe that measures up to 1.8 cm.  There is also a lobulated lesion measuring up to 4 cm in the right middle lobe which roughly measured 3.5 cm on the previous examination.  There appears to be increased interstitial thickening and nodularity along the lateral aspect the large lesion with a pleural based nodule measuring 0.7 cm.  Previously, this pleural based nodule roughly measured 0.6 cm. The 17 gauge needle was successfully directed into the posterior aspect of the right lower lobe nodule with CT fluoroscopy.  A small amount of  parenchymal hemorrhage around the lesion following the core biopsies.  Complications: None  Impression:Successful CT guided core biopsies of the right lower lobe nodule.  The lesions in the right middle lobe appear to have enlarged since 02/11/2012.  Original Report Authenticated By: Richarda Overlie, M.D.    ASSESSMENT: This is a very pleasant 61 years old white female with synchronous primary lung cancer involving the right middle and lower lobe of the right lung. The patient has no other evidence for disease progression based on the recent PET scan results and  CT scan of the head was negative for metastatic disease.  PLAN: I discussed the scan results with the patient today and showed her the images. I recommended for her to see radiation oncology for consideration of curative radiotherapy to this lesions.  I would see her back for followup visit in 2 months for evaluation and discussion of adjuvant chemotherapy option. She was advised to call me immediately if she has any concerning symptoms in the interval.  All questions were answered. The patient knows to call the clinic with any problems, questions or concerns. We can certainly see the patient much sooner if necessary.  I spent 15 minutes counseling the patient face to face. The total time spent in the appointment was 25 minutes.

## 2012-05-28 NOTE — Telephone Encounter (Signed)
appts made and printed for pt aom °

## 2012-05-29 ENCOUNTER — Ambulatory Visit (INDEPENDENT_AMBULATORY_CARE_PROVIDER_SITE_OTHER): Payer: Medicare Other | Admitting: *Deleted

## 2012-05-29 DIAGNOSIS — I4891 Unspecified atrial fibrillation: Secondary | ICD-10-CM

## 2012-05-29 DIAGNOSIS — Z7901 Long term (current) use of anticoagulants: Secondary | ICD-10-CM

## 2012-05-29 LAB — POCT INR: INR: 2.3

## 2012-06-03 ENCOUNTER — Encounter: Payer: Self-pay | Admitting: *Deleted

## 2012-06-03 ENCOUNTER — Encounter: Payer: Self-pay | Admitting: Internal Medicine

## 2012-06-03 DIAGNOSIS — C3490 Malignant neoplasm of unspecified part of unspecified bronchus or lung: Secondary | ICD-10-CM | POA: Insufficient documentation

## 2012-06-03 DIAGNOSIS — R0602 Shortness of breath: Secondary | ICD-10-CM | POA: Insufficient documentation

## 2012-06-03 DIAGNOSIS — T7840XA Allergy, unspecified, initial encounter: Secondary | ICD-10-CM | POA: Insufficient documentation

## 2012-06-04 ENCOUNTER — Encounter: Payer: Self-pay | Admitting: Radiation Oncology

## 2012-06-04 ENCOUNTER — Ambulatory Visit
Admission: RE | Admit: 2012-06-04 | Discharge: 2012-06-04 | Disposition: A | Discharge status: Home / Self Care | Payer: Medicare Other | Source: Ambulatory Visit | Attending: Radiation Oncology | Admitting: Radiation Oncology

## 2012-06-04 ENCOUNTER — Encounter: Payer: Self-pay | Admitting: Internal Medicine

## 2012-06-04 VITALS — BP 111/55 | HR 65 | Temp 98.1°F | Resp 20 | Wt 188.1 lb

## 2012-06-04 DIAGNOSIS — C3490 Malignant neoplasm of unspecified part of unspecified bronchus or lung: Secondary | ICD-10-CM | POA: Insufficient documentation

## 2012-06-04 DIAGNOSIS — C342 Malignant neoplasm of middle lobe, bronchus or lung: Secondary | ICD-10-CM | POA: Insufficient documentation

## 2012-06-04 DIAGNOSIS — Z510 Encounter for antineoplastic radiation therapy: Secondary | ICD-10-CM | POA: Insufficient documentation

## 2012-06-04 HISTORY — DX: Anxiety disorder, unspecified: F41.9

## 2012-06-04 HISTORY — DX: Unspecified asthma, uncomplicated: J45.909

## 2012-06-04 NOTE — Progress Notes (Signed)
Radiation Oncology         [phone] ________________________________  Name: [name] MRN: [id number]  Date: 06/04/2012  DOB: 1951-05-28  NW:GNFAO, Kari Perone, MD  Kari Gaul, MD     REFERRING PHYSICIAN: Si Gaul, MD   DIAGNOSIS: The encounter diagnosis was Lung cancer. the patient has 2 synchronous primary tumors clinically within the right lung   HISTORY OF PRESENT ILLNESS::[name] is a 61 y.o. female who is seen for an initial consultation visit. The patient indicates that she initially had some left-sided shoulder pain which led to CT imaging. In December of 2012 the patient was found to have bilateral lung nodules with a dominant right middle lobe lung nodule. This was suspicious for possible cancer and measured 2.4 cm. There was also a right lower lobe nodule measuring 1.6 m which was also suspicious for a synchronous primary versus metastatic disease. Other smaller nodules were indeterminate. The patient did undergo a PET scan in January of 2012 and this showed a hypermetabolic right middle lobe lung nodule. No evidence of thoracic nodal metastasis. The right lower lobe mixed groundglass and soft tissue nodule was mildly hypermetabolic.  The patient proceeded with a repeat CT scan of the chest in May of 2013. This did show enlarging right middle and right lower lobe lung nodules. The DTO flexible fiberoptic bronchoscopy was performed and the final pathology was not conclusive for malignancy. A CT-guided biopsy was performed of the right lower lobe nodule by interventional radiology and the final pathology was consistent with an invasive well-differentiated adenocarcinoma. The tumor is negative for ALK gene translocation and negative for EGFR mutation.   Further staging with a CT scan of the head was negative for intracranial metastatic disease. A PET scan was completed on 05/21/2012. The right middle lobe nodule remains hypermetabolic. The right lower lobe nodule is  also somewhat hypermetabolic with a maximum SUV of 2.6. There was no gross adenopathy in the right hilar region with equivocal hypermetabolism. Could not exclude nodal metastasis at this time. No extrathoracic disease.  The patient at this time describes ongoing baseline shortness of breath, possibly increased slightly over the last 6 months. She denies any significant chest pain today or other new or increasingly severe distant pain. The patient has seen Dr. Shirline Frees who asked me to see the patient today for consideration of radiotherapy to the chest at this time.   PREVIOUS RADIATION THERAPY: No   PAST MEDICAL HISTORY:  has a past medical history of Atrial fibrillation; Coronary atherosclerosis of native coronary artery; COPD (chronic obstructive pulmonary disease); Mixed hyperlipidemia; Essential hypertension, benign; Tachycardia-bradycardia syndrome; Embolism - blood clot; Lung cancer (04/29/12 dx); SOB (shortness of breath) on exertion; Allergy; Asthma; Anxiety; and Myocardial infarction.     PAST SURGICAL HISTORY: Past Surgical History  Procedure Date  . Appendectomy   . Insert / replace / remove pacemaker 3/11     [employer]. Jude - Dr. [name]  . Video bronchoscopy 02/22/2012    Procedure: VIDEO BRONCHOSCOPY WITH FLUORO;  Surgeon: Nyoka Cowden, MD;  Location: [facility];  Service: Cardiopulmonary;  Laterality: Bilateral;  . Lung biopsy 04/29/12    RLL invasive well diff adenocarcinoma  . Lung biopsy 02/22/12    RML=benign lung parenchyma,no tumor sen  . Tonsillectomy and adenoidectomy     age 38  . Total abdominal hysterectomy 1975    1/2 right ovary intact, left salpingo-oppherctomy     FAMILY HISTORY: family history includes Asthma in her brother and father;  Cancer in her maternal grandmother; Emphysema in her brother; Hypertension in her mother; and Lung cancer in her paternal grandmother.   SOCIAL HISTORY:  reports that she has been smoking Cigarettes.  She has a 10.75 pack-year  smoking history. She has quit using smokeless tobacco. She reports that she does not drink alcohol or use illicit drugs.   ALLERGIES: Latex   MEDICATIONS:  Current Outpatient Prescriptions  Medication Sig Dispense Refill  . albuterol (PROVENTIL) (2.5 MG/3ML) 0.083% nebulizer solution Take 2.5 mg by nebulization every 6 (six) hours as needed.       [name] aspirin 81 MG tablet Take 81 mg by mouth daily.        . flecainide (TAMBOCOR) 100 MG tablet Take 1 tablet (100 mg total) by mouth 2 (two) times daily.  60 tablet  5  . ipratropium (ATROVENT) 0.02 % nebulizer solution Take 500 mcg by nebulization 4 (four) times daily as needed.       . nebivolol (BYSTOLIC) 10 MG tablet       . oxyCODONE-acetaminophen (PERCOCET) 10-325 MG per tablet Take 1 tablet by mouth every 8 (eight) hours as needed. Takes once daily  And prn for knees      . potassium chloride SA (KLOR-CON M20) 20 MEQ tablet Take 1 tablet (20 mEq total) by mouth daily.  60 tablet  2  . PROAIR HFA 108 (90 BASE) MCG/ACT inhaler INHALE 2 PUFFS BY MOUTH EVERY 6 HOURS AS NEEDED FOR WHEEZING  8.5 g  3  . rosuvastatin (CRESTOR) 40 MG tablet Take 1 tablet (40 mg total) by mouth daily.  30 tablet  6  . TRIBENZOR 40-5-25 MG TABS take 1 tablet once daily  30 tablet  5  . warfarin (COUMADIN) 5 MG tablet 5mg  daily except 2.5mg  on Tuesdays, Thursdays and Saturdays or as directed by anticoagulation clinic  45 tablet  2  . traMADol (ULTRAM) 50 MG tablet Take 50 mg by mouth Daily.         REVIEW OF SYSTEMS:  A 15 point review of systems is documented in the electronic medical record. This was obtained by the nursing staff. However, I reviewed this with the patient to discuss relevant findings and make appropriate changes.  Pertinent items are noted in HPI.    PHYSICAL EXAM:  weight is 188 lb 1.6 oz (85.322 kg). Her oral temperature is 98.1 F (36.7 C). Her blood pressure is 111/55 and her pulse is 65. Her respiration is 20 and oxygen saturation is 96%.     General: Well-developed, in no acute distress HEENT: Normocephalic, atraumatic; oral cavity clear Neck: Supple without any lymphadenopathy Cardiovascular: Regular rate and rhythm Respiratory: Clear to auscultation bilaterally GI: Soft, nontender, normal bowel sounds Extremities: No edema present Neuro: No focal deficits    LABORATORY DATA:  Lab Results  Component Value Date   WBC 10.8* 05/14/2012   HGB 13.1 05/14/2012   HCT 38.5 05/14/2012   MCV 91.8 05/14/2012   PLT 168 05/14/2012   Lab Results  Component Value Date   NA 139 05/14/2012   K 3.7 05/14/2012   CL 104 05/14/2012   CO2 26 05/14/2012   Lab Results  Component Value Date   ALT 16 05/14/2012   AST 18 05/14/2012   ALKPHOS 52 05/14/2012   BILITOT 0.4 05/14/2012      RADIOGRAPHY: Ct Head W Wo Contrast  05/21/2012  *RADIOLOGY REPORT*  Clinical Data: Lung cancer with dizziness.  CT HEAD WITHOUT AND WITH CONTRAST  Technique:  Contiguous axial images were obtained from the base of the skull through the vertex without and with intravenous contrast.  Contrast: OMNIPAQUE IOHEXOL 300 MG/ML  SOLN  Comparison: None.  Findings: There is no evidence for acute hemorrhage, hydrocephalus, mass lesion, or abnormal extra-axial fluid collection.  No definite CT evidence for acute infarction.  Imaging after IV contrast administration shows no evidence for enhancing mass lesion.  No abnormal parenchymal enhancement is evident.  The visualized paranasal sinuses and mastoid air cells are clear.  IMPRESSION: Normal exam.  Original Report Authenticated By: ERIC A. MANSELL, M.D.   Nm Pet Image Restag (ps) Skull Base To Thigh  05/21/2012  *RADIOLOGY REPORT*  Clinical Data: Initial treatment strategy for new diagnosis of right lung cancer.  NUCLEAR MEDICINE PET CT SKULL BASE TO THIGH  Technique:  Technique:  17.1 mCi F-18 FDG was injected intravenously. CT data was obtained and used for attenuation correction and anatomic localization only.  (This was not acquired  as a diagnostic CT examination.) Additional exam technical data entered on technologist worksheet.  Comparison: Chest CT of 02/11/2012.  PET of 10/05/2011.  Findings: Neck:  No abnormal hypermetabolism.  Chest:  The right middle lobe lung nodule remains hypermetabolic. It measures 2.5 cm and a S.U.V. max of 14.2 on image 91.  On the prior PET, this measured 2.3 cm and a S.U.V. max of 9.6.  There is activity within the day right hilum, which measures a S.U.V. max of 4.1.  Example image 76.  No gross adenopathy in this area.  The right lower lobe lung nodule measures 1.6 cm and a S.U.V. max of 2.6 on image 99.  On the prior PET, this measured 1.3 cm and a S.U.V. max of 2.1.  More solid and masslike today.  Abdomen/Pelvis:  A focus of hypermetabolism within the lower rectum/anus, without CT correlate.  Skelton:  No abnormal hypermetabolism.  CT images performed for attenuation correction demonstrate no significant findings within the neck.  Pacer with battery in the left supraclavicular region.  Mild cardiomegaly.  Centrilobular emphysema.  Other pulmonary nodules which are below the resolution of PET. Multivessel coronary artery atherosclerosis.  Mild hepatic steatosis.  Left hepatic lobe cysts. Normal adrenal glands.  Mildly prominent ileocolic mesenteric nodes, likely reactive.  Sigmoid diverticulosis and muscular hypertrophy.  Pelvic floor laxity. Right iliac sclerotic lesion which is likely a bone island. Degenerative sclerosis involves the lower thoracic spine.  Trace L4- L5 anterolisthesis which is likely degenerative.  IMPRESSION:  1.  Findings most consistent with  synchronous primary bronchogenic carcinomas within the right middle and right lower lobes. 2.  Equivocal right hilar hypermetabolism.  Cannot exclude nodal metastasis. 3.  No extrathoracic disease. 4.  Hepatic steatosis.  Original Report Authenticated By: Consuello Bossier, M.D.       IMPRESSION: The patient is a pleasant 61 year old female who is  presenting with clinically apparent synchronous lung cancers involving the right lung. There is a 2.5 cm right middle lobe nodule and a 1.6 cm right lower lobe nodule. No clear hilar disease on PET scan was somewhat equivocal findings and no evidence of further mediastinal or distant disease.   PLAN: I discussed possible treatment options with the patient. We discussed her presentation and stage of disease in addition to the possible rationale for radiation treatment in this setting.  I believe that a definitive course of radiosurgery to the 2 right lung nodules would be reasonable at this time. Followup imaging could continue to  follow the hilar/mediastinal regions and the patient is going to see Dr. Shirline Frees after the completion of this treatment. I would anticipate 3-5 fractions of radiosurgery treating the 2 right lung nodules concurrently. Her treatment would be completed in approximately one to one and a half weeks.  I discussed in detail the possible side effects and risks of treatment with the patient. The patient does have some baseline shortness of breath and we discussed irritation or damage to the lungs as being 1 significant risk for her in addition to other issues such as damage to the major airways and irritation of the esophagus in particular. The patient I do not believe is a very good candidate for other aggressive invasive treatment such as surgery. She states that she has been evaluated for possible knee surgery and her physicians did not feel that she would be able to tolerate this procedure. In keeping with this and her comorbidities I do believe that radiosurgery is a reasonable option therefore at this point.  The patient does wish to proceed with this treatment plan as above. She therefore will be scheduled for a simulation as soon as possible such that we can proceed with treatment planning.    I spent 60 minutes minutes face to face with the patient and more than 50% of that  time was spent in counseling and/or coordination of care.    ________________________________   Radene Gunning, MD, PhD
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
## 2012-06-04 NOTE — Progress Notes (Signed)
Please see the Nurse Progress Note in the MD Initial Consult Encounter for this patient. 

## 2012-06-05 ENCOUNTER — Telehealth: Payer: Self-pay | Admitting: *Deleted

## 2012-06-05 NOTE — Telephone Encounter (Signed)
called Dr,[place] office spoke with Belenda Cruise, faxed pacemaker/icd form went through this am at 0855am, asked if she would have MD sign and fax back to [place]  ,"MD is [place] office, But will fax to that office and let co=worker know to give to Dr.Taylor "per Belenda Cruise stated,  And will get this faxed back soon thanked her will forward to Dr.Moody of status 9:02 AM

## 2012-06-10 ENCOUNTER — Other Ambulatory Visit: Payer: Self-pay | Admitting: *Deleted

## 2012-06-10 MED ORDER — ROSUVASTATIN CALCIUM 40 MG PO TABS: 40.0000 mg | ORAL_TABLET | Freq: Every day | ORAL | Status: DC

## 2012-06-18 ENCOUNTER — Ambulatory Visit
Admission: RE | Admit: 2012-06-18 | Discharge: 2012-06-18 | Disposition: A | Discharge status: Home / Self Care | Payer: Medicare Other | Source: Ambulatory Visit | Attending: Radiation Oncology | Admitting: Radiation Oncology

## 2012-06-18 DIAGNOSIS — C342 Malignant neoplasm of middle lobe, bronchus or lung: Secondary | ICD-10-CM

## 2012-06-19 ENCOUNTER — Ambulatory Visit (INDEPENDENT_AMBULATORY_CARE_PROVIDER_SITE_OTHER): Payer: Medicare Other | Admitting: *Deleted

## 2012-06-19 DIAGNOSIS — I4891 Unspecified atrial fibrillation: Secondary | ICD-10-CM

## 2012-06-19 DIAGNOSIS — Z7901 Long term (current) use of anticoagulants: Secondary | ICD-10-CM

## 2012-06-19 LAB — POCT INR: INR: 2.1

## 2012-06-30 ENCOUNTER — Other Ambulatory Visit: Payer: Self-pay | Admitting: *Deleted

## 2012-06-30 ENCOUNTER — Encounter: Payer: Self-pay | Admitting: Radiation Oncology

## 2012-06-30 ENCOUNTER — Ambulatory Visit
Admission: RE | Admit: 2012-06-30 | Discharge: 2012-06-30 | Disposition: A | Discharge status: Home / Self Care | Payer: Medicare Other | Source: Ambulatory Visit | Attending: Radiation Oncology | Admitting: Radiation Oncology

## 2012-06-30 DIAGNOSIS — C342 Malignant neoplasm of middle lobe, bronchus or lung: Secondary | ICD-10-CM

## 2012-06-30 MED ORDER — POTASSIUM CHLORIDE CRYS ER 20 MEQ PO TBCR: 20.0000 meq | EXTENDED_RELEASE_TABLET | Freq: Every day | ORAL | Status: DC

## 2012-06-30 NOTE — Progress Notes (Signed)
  Radiation Oncology         (336) [phone] ________________________________  Name: [name] MRN: [id number]  Date: 06/30/2012  DOB: 1951-08-29  Stereotactic Body Radiotherapy Treatment Procedure Note  NARRATIVE:  [name] was brought to the stereotactic radiation treatment machine and placed supine on the CT couch. The patient was set up for stereotactic body radiotherapy on the body fix pillow.  3D TREATMENT PLANNING AND DOSIMETRY:  The patient's radiation plan was reviewed and approved prior to starting treatment.  It showed 3-dimensional radiation distributions overlaid onto the planning CT.  The [facility] for the target structures as well as the organs at risk were reviewed. The documentation of this is filed in the radiation oncology EMR.  SIMULATION VERIFICATION:  The patient underwent CT imaging on the treatment unit.  These were carefully aligned to document that the ablative radiation dose would cover the target volume and maximally spare the nearby organs at risk according to the planned distribution.  SPECIAL TREATMENT PROCEDURE: [name] received high dose ablative stereotactic body radiotherapy to the planned target volume without unforeseen complications. Treatment was delivered uneventfully. The high doses associated with stereotactic body radiotherapy and the significant potential risks require careful treatment set up and patient monitoring constituting a special treatment procedure   STEREOTACTIC TREATMENT MANAGEMENT:  Following delivery, the patient was evaluated clinically. The patient tolerated treatment without significant acute effects, and was discharged to home in stable condition.    PLAN: Continue treatment as planned.  ________________________________  Billie Lade, PhD, MD

## 2012-07-01 ENCOUNTER — Encounter: Payer: Self-pay | Admitting: Radiation Oncology

## 2012-07-02 ENCOUNTER — Ambulatory Visit
Admission: RE | Admit: 2012-07-02 | Discharge: 2012-07-02 | Disposition: A | Discharge status: Home / Self Care | Payer: Medicare Other | Source: Ambulatory Visit | Attending: Radiation Oncology | Admitting: Radiation Oncology

## 2012-07-02 ENCOUNTER — Encounter: Payer: Self-pay | Admitting: Radiation Oncology

## 2012-07-02 DIAGNOSIS — C342 Malignant neoplasm of middle lobe, bronchus or lung: Secondary | ICD-10-CM

## 2012-07-02 NOTE — Progress Notes (Signed)
  Radiation Oncology         [phone]) [phone] ________________________________  Name: [name] MRN: [id number]  Date: 07/02/2012  DOB: [birth year]  Stereotactic Body Radiotherapy Treatment Procedure Note  NARRATIVE:  [name] was brought to the stereotactic radiation treatment machine and placed supine on the CT couch. The patient was set up for stereotactic body radiotherapy on the body fix pillow.  3D TREATMENT PLANNING AND DOSIMETRY:  The patient's radiation plan was reviewed and approved prior to starting treatment.  It showed 3-dimensional radiation distributions overlaid onto the planning CT.  The [facility] for the target structures as well as the organs at risk were reviewed. The documentation of this is filed in the radiation oncology EMR.  SIMULATION VERIFICATION:  The patient underwent CT imaging on the treatment unit.  These were carefully aligned to document that the ablative radiation dose would cover the target volume and maximally spare the nearby organs at risk according to the planned distribution.  SPECIAL TREATMENT PROCEDURE: [name] received high dose ablative stereotactic body radiotherapy to the planned target volume without unforeseen complications. Treatment was delivered uneventfully. The high doses associated with stereotactic body radiotherapy and the significant potential risks require careful treatment set up and patient monitoring constituting a special treatment procedure   STEREOTACTIC TREATMENT MANAGEMENT:  Following delivery, the patient was evaluated clinically. The patient tolerated treatment without significant acute effects, and was discharged to home in stable condition.    PLAN: Continue treatment as planned.  ________________________________  Billie Lade, PhD, MD

## 2012-07-03 ENCOUNTER — Encounter: Payer: Self-pay | Admitting: Radiation Oncology

## 2012-07-04 ENCOUNTER — Ambulatory Visit
Admission: RE | Admit: 2012-07-04 | Discharge: 2012-07-04 | Disposition: A | Discharge status: Home / Self Care | Payer: Medicare Other | Source: Ambulatory Visit | Attending: Radiation Oncology | Admitting: Radiation Oncology

## 2012-07-04 ENCOUNTER — Encounter: Payer: Self-pay | Admitting: Radiation Oncology

## 2012-07-04 DIAGNOSIS — C342 Malignant neoplasm of middle lobe, bronchus or lung: Secondary | ICD-10-CM

## 2012-07-04 NOTE — Progress Notes (Signed)
   Department of Radiation Oncology  Phone:  [phone] Fax:        [phone]  Weekly Treatment Note    Name: [name] Date: 07/04/2012 MRN: [id number] DOB: 10-10-1950   Current dose: 36 Gy  Current fraction: 3   MEDICATIONS: Current Outpatient Prescriptions  Medication Sig Dispense Refill  . albuterol (PROVENTIL) (2.5 MG/3ML) 0.083% nebulizer solution Take 2.5 mg by nebulization every 6 (six) hours as needed.       [name] aspirin 81 MG tablet Take 81 mg by mouth daily.        . flecainide (TAMBOCOR) 100 MG tablet Take 1 tablet (100 mg total) by mouth 2 (two) times daily.  60 tablet  5  . ipratropium (ATROVENT) 0.02 % nebulizer solution Take 500 mcg by nebulization 4 (four) times daily as needed.       . nebivolol (BYSTOLIC) 10 MG tablet       . oxyCODONE-acetaminophen (PERCOCET) 10-325 MG per tablet Take 1 tablet by mouth every 8 (eight) hours as needed. Takes once daily  And prn for knees      . potassium chloride SA (KLOR-CON M20) 20 MEQ tablet Take 1 tablet (20 mEq total) by mouth daily.  30 tablet  5  . PROAIR HFA 108 (90 BASE) MCG/ACT inhaler INHALE 2 PUFFS BY MOUTH EVERY 6 HOURS AS NEEDED FOR WHEEZING  8.5 g  3  . rosuvastatin (CRESTOR) 40 MG tablet Take 1 tablet (40 mg total) by mouth daily.  30 tablet  11  . traMADol (ULTRAM) 50 MG tablet Take 50 mg by mouth Daily.      [name] 40-5-25 MG TABS take 1 tablet once daily  30 tablet  5  . warfarin (COUMADIN) 5 MG tablet 5mg  daily except 2.5mg  on Tuesdays, Thursdays and Saturdays or as directed by anticoagulation clinic  45 tablet  2     ALLERGIES: Latex   LABORATORY DATA:  Lab Results  Component Value Date   WBC 10.8* 05/14/2012   HGB 13.1 05/14/2012   HCT 38.5 05/14/2012   MCV 91.8 05/14/2012   PLT 168 05/14/2012   Lab Results  Component Value Date   NA 139 05/14/2012   K 3.7 05/14/2012   CL 104 05/14/2012   CO2 26 05/14/2012   Lab Results  Component Value Date   ALT 16 05/14/2012   AST 18 05/14/2012   ALKPHOS 52  05/14/2012   BILITOT 0.4 05/14/2012     NARRATIVE: [name] was seen today for weekly treatment management. The chart was checked and the patient's films were reviewed. No problems so far with treatment. No difficulties. We are halfway through treatment as of today.  PHYSICAL EXAMINATION: vitals were not taken for this visit.     patient is alert, no acute distress  ASSESSMENT: The patient is doing satisfactorily with treatment.  PLAN: We will continue with the patient's radiation treatment as planned.

## 2012-07-07 ENCOUNTER — Encounter: Payer: Self-pay | Admitting: Radiation Oncology

## 2012-07-07 ENCOUNTER — Ambulatory Visit
Admission: RE | Admit: 2012-07-07 | Discharge: 2012-07-07 | Disposition: A | Discharge status: Home / Self Care | Payer: Medicare Other | Source: Ambulatory Visit | Attending: Radiation Oncology | Admitting: Radiation Oncology

## 2012-07-09 ENCOUNTER — Encounter: Payer: Self-pay | Admitting: Radiation Oncology

## 2012-07-09 ENCOUNTER — Ambulatory Visit
Admission: RE | Admit: 2012-07-09 | Discharge: 2012-07-09 | Disposition: A | Discharge status: Home / Self Care | Payer: Medicare Other | Source: Ambulatory Visit | Attending: Radiation Oncology | Admitting: Radiation Oncology

## 2012-07-09 DIAGNOSIS — C342 Malignant neoplasm of middle lobe, bronchus or lung: Secondary | ICD-10-CM

## 2012-07-09 NOTE — Progress Notes (Signed)
  Radiation Oncology         [phone] ________________________________  Name: [name] MRN: [id number]  Date: 07/09/2012  DOB: Oct 23, 1950   Simulation verification note  The patient underwent film verification for the patient's set-up in preparation for stereotactic body radiosurgery. The patient was placed on the treatment unit and a CT scan was performed. These images were then fused with the patient's planning CT scan. The fusion was carefully reviewed in terms of the patient's anatomy as it related to the planning CT scan. The target structures as well as the organs at risk were evaluated on the patient's treatment CT scan. The target and the normal structures were appropriately aligned for treatment. Therefore the patient proceeded with the fraction of stereotactic body radiosurgery.  Fraction: 5  Dose:  60 Gy   ________________________________  Radene Gunning, MD, PhD

## 2012-07-10 ENCOUNTER — Ambulatory Visit (INDEPENDENT_AMBULATORY_CARE_PROVIDER_SITE_OTHER): Payer: Medicare Other | Admitting: *Deleted

## 2012-07-10 DIAGNOSIS — I4891 Unspecified atrial fibrillation: Secondary | ICD-10-CM

## 2012-07-10 DIAGNOSIS — Z7901 Long term (current) use of anticoagulants: Secondary | ICD-10-CM

## 2012-07-11 NOTE — Progress Notes (Signed)
°  Radiation Oncology         [phone]) [phone] ________________________________  Name: [name] MRN: [id number]  Date: 07/07/2012  DOB: 20-Jun-1951  Stereotactic Body Radiotherapy Treatment Procedure Note  NARRATIVE:  [name] was brought to the stereotactic radiation treatment machine and placed supine on the CT couch. The patient was set up for stereotactic body radiotherapy on the body fix pillow.  3D TREATMENT PLANNING AND DOSIMETRY:  The patient's radiation plan was reviewed and approved prior to starting treatment.  It showed 3-dimensional radiation distributions overlaid onto the planning CT.  The [facility] for the target structures as well as the organs at risk were reviewed. The documentation of this is filed in the radiation oncology EMR.  SIMULATION VERIFICATION:  The patient underwent CT imaging on the treatment unit.  These were carefully aligned to document that the ablative radiation dose would cover the target volume and maximally spare the nearby organs at risk according to the planned distribution.  SPECIAL TREATMENT PROCEDURE: [name] received high dose ablative stereotactic body radiotherapy to the planned target volume without unforeseen complications. Treatment was delivered uneventfully. The high doses associated with stereotactic body radiotherapy and the significant potential risks require careful treatment set up and patient monitoring constituting a special treatment procedure   STEREOTACTIC TREATMENT MANAGEMENT:  Following delivery, the patient was evaluated clinically. The patient tolerated treatment without significant acute effects, and was discharged to home in stable condition.    PLAN: Continue treatment as planned.  ________________________________  Artist Pais. Kathrynn Running, M.D.

## 2012-07-30 ENCOUNTER — Other Ambulatory Visit (HOSPITAL_BASED_OUTPATIENT_CLINIC_OR_DEPARTMENT_OTHER): Payer: Medicare Other | Admitting: Lab

## 2012-07-30 ENCOUNTER — Ambulatory Visit (HOSPITAL_BASED_OUTPATIENT_CLINIC_OR_DEPARTMENT_OTHER): Payer: Medicare Other | Admitting: Internal Medicine

## 2012-07-30 ENCOUNTER — Telehealth: Payer: Self-pay | Admitting: Internal Medicine

## 2012-07-30 VITALS — BP 138/76 | HR 97 | Temp 98.4°F | Resp 20 | Ht 62.0 in | Wt 182.4 lb

## 2012-07-30 DIAGNOSIS — C3430 Malignant neoplasm of lower lobe, unspecified bronchus or lung: Secondary | ICD-10-CM

## 2012-07-30 DIAGNOSIS — C342 Malignant neoplasm of middle lobe, bronchus or lung: Secondary | ICD-10-CM

## 2012-07-30 DIAGNOSIS — I4891 Unspecified atrial fibrillation: Secondary | ICD-10-CM

## 2012-07-30 DIAGNOSIS — C3490 Malignant neoplasm of unspecified part of unspecified bronchus or lung: Secondary | ICD-10-CM

## 2012-07-30 LAB — COMPREHENSIVE METABOLIC PANEL (CC13)
BUN: 14 mg/dL (ref 7.0–26.0)
CO2: 26 mEq/L (ref 22–29)
Creatinine: 0.8 mg/dL (ref 0.6–1.1)
Glucose: 129 mg/dl — ABNORMAL HIGH (ref 70–99)
Sodium: 140 mEq/L (ref 136–145)
Total Bilirubin: 0.4 mg/dL (ref 0.20–1.20)
Total Protein: 6.3 g/dL — ABNORMAL LOW (ref 6.4–8.3)

## 2012-07-30 LAB — CBC WITH DIFFERENTIAL/PLATELET
Basophils Absolute: 0 10*3/uL (ref 0.0–0.1)
EOS%: 2.3 % (ref 0.0–7.0)
Eosinophils Absolute: 0.2 10*3/uL (ref 0.0–0.5)
HCT: 39.9 % (ref 34.8–46.6)
HGB: 13.4 g/dL (ref 11.6–15.9)
LYMPH%: 7.7 % — ABNORMAL LOW (ref 14.0–49.7)
MCH: 31.1 pg (ref 25.1–34.0)
MCV: 92.6 fL (ref 79.5–101.0)
MONO%: 10.4 % (ref 0.0–14.0)
NEUT#: 6.6 10*3/uL — ABNORMAL HIGH (ref 1.5–6.5)
NEUT%: 79.5 % — ABNORMAL HIGH (ref 38.4–76.8)
Platelets: 132 10*3/uL — ABNORMAL LOW (ref 145–400)

## 2012-07-30 NOTE — Patient Instructions (Signed)
Followup in 3 months with repeat CT scan of the chest 

## 2012-07-30 NOTE — Progress Notes (Signed)
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   PRIOR THERAPY: Stereotactic radiotherapy to the right lung lesions under the care of Dr. Mitzi Hansen completed on 07/09/2012.   CURRENT THERAPY: None  INTERVAL HISTORY: [name] 61 y.o. female returns to the clinic today for followup visit. The patient tolerated her previous stereotactic radiotherapy to the right lung lesions fairly well with no significant complaints except for fatigue. She denied having any significant chest pain or shortness breath but continues to have dry cough. She has no hemoptysis. The patient denied having any significant weight loss or night sweats. She is here today for evaluation and discussion of her adjuvant chemotherapy options.   MEDICAL HISTORY: Past Medical History  Diagnosis Date  . Atrial fibrillation   . Coronary atherosclerosis of native coronary artery     Nonobstructive, LVEF 60%  . COPD (chronic obstructive pulmonary disease)   . Mixed hyperlipidemia   . Essential hypertension, benign   . Tachycardia-bradycardia syndrome   . Embolism - blood clot   . Lung cancer 04/29/12 dx    RLL needle bx=invasive well diff adenocarcinoma  [facility]  . SOB (shortness of breath) on exertion   . Allergy     Latex  . Asthma   . Anxiety   . Myocardial infarction     12 years ago ?    ALLERGIES:  is allergic to latex.  MEDICATIONS:  Current Outpatient Prescriptions  Medication Sig Dispense Refill  . albuterol (PROVENTIL) (2.5 MG/3ML) 0.083% nebulizer solution Take 2.5 mg by nebulization every 6 (six) hours as needed.       [name] aspirin 81 MG tablet Take 81 mg by mouth  daily.        . flecainide (TAMBOCOR) 100 MG tablet Take 1 tablet (100 mg total) by mouth 2 (two) times daily.  60 tablet  5  . ipratropium (ATROVENT) 0.02 % nebulizer solution Take 500 mcg by nebulization 4 (four) times daily as needed.       . nebivolol (BYSTOLIC) 10 MG tablet       . oxyCODONE-acetaminophen (PERCOCET) 10-325 MG per tablet Take 1 tablet by mouth every 8 (eight) hours as needed. Takes once daily  And prn for knees      . potassium chloride SA (KLOR-CON M20) 20 MEQ tablet Take 1 tablet (20 mEq total) by mouth daily.  30 tablet  5  . PROAIR HFA 108 (90 BASE) MCG/ACT inhaler INHALE 2 PUFFS BY MOUTH EVERY 6 HOURS AS NEEDED FOR WHEEZING  8.5 g  3  . rosuvastatin (CRESTOR) 40 MG tablet Take 1 tablet (40 mg total) by mouth daily.  30 tablet  11  . traMADol (ULTRAM) 50 MG tablet Take 50 mg by mouth Daily.      [name] 40-5-25 MG TABS take 1 tablet once daily  30 tablet  5  . warfarin (COUMADIN) 5 MG tablet 5mg  daily except 2.5mg  on Tuesdays, Thursdays and Saturdays or as directed by anticoagulation clinic  45 tablet  2    SURGICAL HISTORY:  Past Surgical History  Procedure Date  . Appendectomy   . Insert / replace / remove pacemaker 3/11  [employer] - Dr. [name]  . Video bronchoscopy 02/22/2012    Procedure: VIDEO BRONCHOSCOPY WITH FLUORO;  Surgeon: Nyoka Cowden, MD;  Location: [facility];  Service: Cardiopulmonary;  Laterality: Bilateral;  . Lung biopsy 04/29/12    RLL invasive well diff adenocarcinoma  . Lung biopsy 02/22/12    RML=benign lung parenchyma,no tumor sen  . Tonsillectomy and adenoidectomy     age 34  . Total abdominal hysterectomy 1975    1/2 right ovary intact, left salpingo-oppherctomy    REVIEW OF SYSTEMS:  A comprehensive review of systems was negative except for: Constitutional: positive for fatigue Respiratory: positive for cough   PHYSICAL EXAMINATION: General appearance: alert, cooperative and no distress Head: Normocephalic, without obvious  abnormality, atraumatic Neck: no adenopathy Resp: clear to auscultation bilaterally Cardio: regular rate and rhythm, S1, S2 normal, no murmur, click, rub or gallop GI: soft, non-tender; bowel sounds normal; no masses,  no organomegaly Extremities: extremities normal, atraumatic, no cyanosis or edema  ECOG PERFORMANCE STATUS: 1 - Symptomatic but completely ambulatory  Blood pressure 138/76, pulse 97, temperature 98.4 F (36.9 C), temperature source Oral, resp. rate 20, height 5\' 2"  (1.575 m), weight 182 lb 6.4 oz (82.736 kg).  LABORATORY DATA: Lab Results  Component Value Date   WBC 8.3 07/30/2012   HGB 13.4 07/30/2012   HCT 39.9 07/30/2012   MCV 92.6 07/30/2012   PLT 132* 07/30/2012      Chemistry      Component Value Date/Time   NA 139 05/14/2012 0950   K 3.7 05/14/2012 0950   CL 104 05/14/2012 0950   CO2 26 05/14/2012 0950   BUN 15 05/14/2012 0950   CREATININE 0.81 05/14/2012 0950   CREATININE 0.80 09/24/2011 1058      Component Value Date/Time   CALCIUM 8.9 05/14/2012 0950   ALKPHOS 52 05/14/2012 0950   AST 18 05/14/2012 0950   ALT 16 05/14/2012 0950   BILITOT 0.4 05/14/2012 0950       RADIOGRAPHIC STUDIES: No results found.  ASSESSMENT: This is a very pleasant 60 years old white female with history of synchronous stage I a non-small cell lung cancer involving the right middle and lower lobes of the lung status post stereotactic radiotherapy.  PLAN: I have a lengthy discussion with the patient today about her condition. I gave her the option of consideration of systemic chemotherapy therapy with carboplatin and Alimta versus observation and close monitoring. The patient would like to continue on observation for now. I would see her back for followup visit in 3 months with repeat CT scan of the chest with contrast. She was advised to call me immediately if she has any concerning symptoms in the interval.  All questions were answered. The patient knows to call the clinic with any  problems, questions or concerns. We can certainly see the patient much sooner if necessary.

## 2012-07-30 NOTE — Telephone Encounter (Signed)
appts made and printed for pt aom °

## 2012-08-12 ENCOUNTER — Telehealth: Payer: Self-pay | Admitting: Internal Medicine

## 2012-08-12 NOTE — Telephone Encounter (Signed)
Please call in [facility] to [facility] in [place] for this patient.  / tg

## 2012-08-13 ENCOUNTER — Ambulatory Visit (INDEPENDENT_AMBULATORY_CARE_PROVIDER_SITE_OTHER): Payer: Medicare Other | Admitting: *Deleted

## 2012-08-13 ENCOUNTER — Other Ambulatory Visit: Payer: Self-pay | Admitting: Internal Medicine

## 2012-08-13 ENCOUNTER — Other Ambulatory Visit: Payer: Self-pay | Admitting: *Deleted

## 2012-08-13 DIAGNOSIS — I4891 Unspecified atrial fibrillation: Secondary | ICD-10-CM

## 2012-08-13 DIAGNOSIS — Z7901 Long term (current) use of anticoagulants: Secondary | ICD-10-CM

## 2012-08-13 LAB — POCT INR: INR: 1.9

## 2012-08-13 MED ORDER — FLECAINIDE ACETATE 100 MG PO TABS: 100.0000 mg | ORAL_TABLET | Freq: Two times a day (BID) | ORAL | Status: DC

## 2012-08-13 NOTE — Telephone Encounter (Signed)
Done

## 2012-08-13 NOTE — Telephone Encounter (Signed)
Pt is out of meds needs today

## 2012-08-14 ENCOUNTER — Encounter: Payer: Self-pay | Admitting: Radiation Oncology

## 2012-08-14 ENCOUNTER — Ambulatory Visit: Payer: Medicare Other | Admitting: Radiation Oncology

## 2012-08-20 ENCOUNTER — Encounter: Payer: Self-pay | Admitting: *Deleted

## 2012-08-22 ENCOUNTER — Ambulatory Visit: Payer: Medicare Other | Admitting: Adult Health

## 2012-08-27 ENCOUNTER — Ambulatory Visit: Payer: Medicare Other | Admitting: Adult Health

## 2012-08-28 ENCOUNTER — Ambulatory Visit (INDEPENDENT_AMBULATORY_CARE_PROVIDER_SITE_OTHER): Payer: Medicare Other | Admitting: Adult Health

## 2012-08-28 ENCOUNTER — Encounter: Payer: Self-pay | Admitting: Adult Health

## 2012-08-28 VITALS — BP 129/68 | HR 67 | Ht 62.0 in | Wt 180.0 lb

## 2012-08-28 DIAGNOSIS — J449 Chronic obstructive pulmonary disease, unspecified: Secondary | ICD-10-CM

## 2012-08-28 DIAGNOSIS — J4489 Other specified chronic obstructive pulmonary disease: Secondary | ICD-10-CM

## 2012-08-28 DIAGNOSIS — I4891 Unspecified atrial fibrillation: Secondary | ICD-10-CM

## 2012-08-28 NOTE — Addendum Note (Signed)
Encounter addended by: Jonna Coup, MD on: 08/28/2012  8:13 AM<BR>     Documentation filed: Notes Section

## 2012-08-28 NOTE — Progress Notes (Signed)
[facility] Radiation Oncology Simulation and Treatment Planning Note   Name:  [name] MRN: [id number]   Date: 06/18/2012  DOB: 1951/06/29  Status:outpatient    DIAGNOSIS: The encounter diagnosis was Malignant neoplasm middle lobe, bronchus or lung.  SITE:  The patient will be treated to 2 distinct lesions/targets within the right lung.   CONSENT VERIFIED:yes   SET UP: Patient is setup supine   IMMOBILIZATION: The patient was immobilized using a Vac Loc bag, and a customized active form device was also constructed to aid in patient immobilization. The patient set up also involved abdominal compression to attempt to reduce respiratory motion. A total of 2 complex treatment devices therefore will be used for immobilization during the course of radiation.    NARRATIVE:The patient was brought to the CT Simulation planning suite.  Identity was confirmed.  All relevant records and images related to the planned course of therapy were reviewed.  Then, the patient was positioned in a stable reproducible clinical set-up for radiation therapy. Abdominal compression was applied by me.  4D CT images were obtained and reproducible breathing pattern was confirmed. Free breathing CT images were obtained.  Skin markings were placed.  The CT images were loaded into the planning software where the target and avoidance structures were contoured.  The radiation prescription was entered and confirmed.    TREATMENT PLANNING NOTE:  Treatment planning then occurred. I have requested : MLC's, 3D simulation/ isodose plan, basic dose calculation. It is anticipated that for customized fields will be used for the patient's treatment, with each of these corresponding to an additional complex treatment device.  3 dimensional simulation is performed and dose volume histogram of the gross tumor volume, planning tumor volume and criticial normal structures including the spinal cord and lungs were  analyzed and requested.  Special treatment procedure was performed due to high dose per fraction and the complexity of the planning process.  The patient will be monitored for increased risk of toxicity.  Daily imaging using cone beam CT/ MV CT will be used for target localization.   PLAN:  The patient will receive 60 Gy in 5 fractions to each of the 2 lesions within the right lung. These represent 2 separate targets for this treatment of stereotactic body radiotherapy.    ________________________________   Radene Gunning, MD, PhD

## 2012-08-28 NOTE — Patient Instructions (Addendum)
Your physician recommends that you schedule a follow-up appointment in: 6 months  

## 2012-08-28 NOTE — Progress Notes (Signed)
  Radiation Oncology         [phone] ________________________________  Name: [name] MRN: [id number]  Date: 07/09/2012  DOB: [birth year]  End of Treatment Note  Diagnosis:   Non-small cell lung cancer with 2 synchronous primaries in the right lung     Indication for treatment:  Curative       Radiation treatment dates:   06/30/2012 through 07/09/2012  Site/dose:   The patient was treated to 2 distinct target lesions within the right lung. This treatment consisted of a course of stereotactic body radiotherapy. The patient was treated to each lesion to a dose of 60 gray in 5 fractions at 12 gray per fraction on an every other day basis. A total of 4 treatment fields were used.  Narrative: The patient tolerated radiation treatment relatively well.   The patient did not exhibit any significant acute toxicity. No worsening shortness of breath, esophagitis, chest pain.  Plan: The patient has completed radiation treatment. The patient will return to radiation oncology clinic for routine followup in one month. I advised the patient to call or return sooner if they have any questions or concerns related to their recovery or treatment. ________________________________  Radene Gunning, M.D., Ph.D.

## 2012-08-28 NOTE — Progress Notes (Signed)
HPI: [name] is a 61 year old patient of Dr. [name] we are seeing for ongoing assessment treatment of symptomatic bradycardia, status post St. Jude pacemaker, hypertension, paroxysmal atrial fibrillation, with recent diagnosis of lung cancer status post radiation therapy. She comes today with no new complaints with the exception of ongoing dyspnea on exertion. She has been coughing a good bit, using her inhalers more, and feeling more tired. She denies recurrent chest pain, rapid heart rhythm, or dizziness. She is due to followup with Dr. Arbutus Ped, her oncologist, next week for further evaluation. She continues to be medically compliant, and is being followed in our Coumadin clinic.  Allergies  Allergen Reactions  . Latex     "pulls skin off"    Current Outpatient Prescriptions  Medication Sig Dispense Refill  . albuterol (PROVENTIL) (2.5 MG/3ML) 0.083% nebulizer solution Take 2.5 mg by nebulization every 6 (six) hours as needed.       [name] aspirin 81 MG tablet Take 81 mg by mouth daily.        . flecainide (TAMBOCOR) 100 MG tablet Take 1 tablet (100 mg total) by mouth 2 (two) times daily.  60 tablet  5  . Glucosamine-Chondroitin (GLUCOSAMINE CHONDR COMPLEX PO) Take 1 tablet by mouth daily.      [name] ipratropium (ATROVENT) 0.02 % nebulizer solution Take 500 mcg by nebulization 4 (four) times daily as needed.       [name] MELATONIN ER PO Take 5 mg by mouth at bedtime.      . Multiple Vitamins-Calcium (ONE-A-DAY WOMENS PO) Take 1 tablet by mouth daily.      . nebivolol (BYSTOLIC) 10 MG tablet Take 10 mg by mouth daily.       [name] oxyCODONE-acetaminophen (PERCOCET) 10-325 MG per tablet Take 1 tablet by mouth every 8 (eight) hours as needed. Takes once daily  And prn for knees      . potassium chloride SA (KLOR-CON M20) 20 MEQ tablet Take 1 tablet (20 mEq total) by mouth daily.  30 tablet  5  . PROAIR HFA 108 (90 BASE) MCG/ACT inhaler INHALE 2 PUFFS BY MOUTH EVERY 6 HOURS AS NEEDED FOR WHEEZING  8.5 g  3  .  rosuvastatin (CRESTOR) 40 MG tablet Take 1 tablet (40 mg total) by mouth daily.  30 tablet  11  . TRIBENZOR 40-5-25 MG TABS take 1 tablet once daily  30 tablet  5  . warfarin (COUMADIN) 5 MG tablet 5mg  daily except 2.5mg  on Tuesdays, Thursdays and Saturdays or as directed by anticoagulation clinic  45 tablet  2    Past Medical History  Diagnosis Date  . Atrial fibrillation   . Coronary atherosclerosis of native coronary artery     Nonobstructive, LVEF 60%  . COPD (chronic obstructive pulmonary disease)   . Mixed hyperlipidemia   . Essential hypertension, benign   . Tachycardia-bradycardia syndrome   . Embolism - blood clot   . Lung cancer 04/29/12 dx    RLL needle bx=invasive well diff adenocarcinoma  nscca  . SOB (shortness of breath) on exertion   . Allergy     Latex  . Asthma   . Anxiety   . Myocardial infarction     12 years ago ?  [name] History of radiation therapy     59 gy fraction 5 eot 07/09/12 right lung    Past Surgical History  Procedure Date  . Appendectomy   . Insert / replace / remove pacemaker 3/11     St. Jude -  Dr. [name]  . Video bronchoscopy 02/22/2012    Procedure: VIDEO BRONCHOSCOPY WITH FLUORO;  Surgeon: Nyoka Cowden, MD;  Location: [facility];  Service: Cardiopulmonary;  Laterality: Bilateral;  . Lung biopsy 04/29/12    RLL invasive well diff adenocarcinoma  . Lung biopsy 02/22/12    RML=benign lung parenchyma,no tumor sen  . Tonsillectomy and adenoidectomy     age 62  . Total abdominal hysterectomy 1975    1/2 right ovary intact, left salpingo-oppherctomy    ZOX:WRUEAV of systems complete and found to be negative unless listed above  PHYSICAL EXAM BP 129/68  Pulse 67  Ht 5\' 2"  (1.575 m)  Wt 180 lb (81.647 kg)  BMI 32.92 kg/m2  SpO2 96%  General: Well developed, well nourished, in no acute distress Head: Eyes PERRLA, No xanthomas.   Normal cephalic and atramatic  Lungs: Clear bilaterally to auscultation diminished on the left. Heart: HRRR S1  S2, without MRG.  Pulses are 2+ & equal.            No carotid bruit. No JVD.  No abdominal bruits. No femoral bruits. Abdomen: Bowel sounds are positive, abdomen soft and non-tender without masses or                  Hernia's noted. Msk:  Back normal, normal gait. Normal strength and tone for age. Extremities: No clubbing, cyanosis or edema.  DP +1 Neuro: Alert and oriented X 3. Psych:  Good affect, responds appropriately  EKG:NSR rate of 68 bpm  ASSESSMENT AND PLAN

## 2012-08-28 NOTE — Progress Notes (Signed)
  Radiation Oncology         [phone] ________________________________  Name: [name] MRN: [id number]  Date: 07/04/2012  DOB: [birth year]   Simulation verification note  The patient underwent film verification for the patient's set-up in preparation for stereotactic body radiosurgery. The patient was placed on the treatment unit and a CT scan was performed. These images were then fused with the patient's planning CT scan. The fusion was carefully reviewed in terms of the patient's anatomy as it related to the planning CT scan. The target structures as well as the organs at risk were evaluated on the patient's treatment CT scan. The target and the normal structures were appropriately aligned for treatment. Therefore the patient proceeded with the fraction of stereotactic body radiosurgery.  Fraction: 3  Dose:  36 Gy   ________________________________  Radene Gunning, MD, PhD

## 2012-08-28 NOTE — Progress Notes (Deleted)
Name: [name]    DOB: 1950/10/19  Age: 61 y.o.  MR#: [id number]       PCP:  Ernestine Conrad, MD      Insurance: @PAYORNAME @   CC:   No chief complaint on file.   VS BP 129/68  Pulse 67  Ht 5\' 2"  (1.575 m)  Wt 180 lb (81.647 kg)  BMI 32.92 kg/m2  SpO2 96%  Weights Current Weight  08/28/12 180 lb (81.647 kg)  07/30/12 182 lb 6.4 oz (82.736 kg)  06/04/12 188 lb 1.6 oz (85.322 kg)    Blood Pressure  BP Readings from Last 3 Encounters:  08/28/12 129/68  07/30/12 138/76  06/04/12 111/55     Admit date:  (Not on file) Last encounter with RMR:  04/22/2012   Allergy Allergies  Allergen Reactions  . Latex     "pulls skin off"    Current Outpatient Prescriptions  Medication Sig Dispense Refill  . albuterol (PROVENTIL) (2.5 MG/3ML) 0.083% nebulizer solution Take 2.5 mg by nebulization every 6 (six) hours as needed.       [name] aspirin 81 MG tablet Take 81 mg by mouth daily.        . flecainide (TAMBOCOR) 100 MG tablet Take 1 tablet (100 mg total) by mouth 2 (two) times daily.  60 tablet  5  . Glucosamine-Chondroitin (GLUCOSAMINE CHONDR COMPLEX PO) Take 1 tablet by mouth daily.      [name] ipratropium (ATROVENT) 0.02 % nebulizer solution Take 500 mcg by nebulization 4 (four) times daily as needed.       [name] MELATONIN ER PO Take 5 mg by mouth at bedtime.      . Multiple Vitamins-Calcium (ONE-A-DAY WOMENS PO) Take 1 tablet by mouth daily.      . nebivolol (BYSTOLIC) 10 MG tablet Take 10 mg by mouth daily.       [name] oxyCODONE-acetaminophen (PERCOCET) 10-325 MG per tablet Take 1 tablet by mouth every 8 (eight) hours as needed. Takes once daily  And prn for knees      . potassium chloride SA (KLOR-CON M20) 20 MEQ tablet Take 1 tablet (20 mEq total) by mouth daily.  30 tablet  5  . PROAIR HFA 108 (90 BASE) MCG/ACT inhaler INHALE 2 PUFFS BY MOUTH EVERY 6 HOURS AS NEEDED FOR WHEEZING  8.5 g  3  . rosuvastatin (CRESTOR) 40 MG tablet Take 1 tablet (40 mg total) by mouth daily.  30 tablet  11  .  TRIBENZOR 40-5-25 MG TABS take 1 tablet once daily  30 tablet  5  . warfarin (COUMADIN) 5 MG tablet 5mg  daily except 2.5mg  on Tuesdays, Thursdays and Saturdays or as directed by anticoagulation clinic  45 tablet  2    Discontinued Meds:    Medications Discontinued During This Encounter  Medication Reason  . traMADol (ULTRAM) 50 MG tablet Error    Patient Active Problem List  Diagnosis  . HYPERLIPIDEMIA  . OVERWEIGHT/OBESITY  . Smoker  . CORONARY ATHEROSCLEROSIS NATIVE CORONARY ARTERY  . ATRIAL FIBRILLATION  . BRADYCARDIA-TACHYCARDIA SYNDROME  . COPD (chronic obstructive pulmonary disease)  . CARDIAC PACEMAKER IN SITU  . Essential hypertension, benign  . Encounter for long-term (current) use of anticoagulants  . Chest pain  . Pulmonary nodules  . Shoulder pain  . Malignant neoplasm of bronchus and lung, unspecified site  . Lung cancer  . SOB (shortness of breath) on exertion  . Allergy  . Malignant neoplasm middle lobe, bronchus or lung  LABS Anti-coag visit on 08/13/2012  Component Date Value  . INR 08/13/2012 1.9   Appointment on 07/30/2012  Component Date Value  . WBC 07/30/2012 8.3   . NEUT# 07/30/2012 6.6*  . HGB 07/30/2012 13.4   . HCT 07/30/2012 39.9   . Platelets 07/30/2012 132*  . MCV 07/30/2012 92.6   . [facility] 07/30/2012 31.1   . MCHC 07/30/2012 33.6   . RBC 07/30/2012 4.31   . RDW 07/30/2012 15.5*  . lymph# 07/30/2012 0.6*  . MONO# 07/30/2012 0.9   . Eosinophils Absolute 07/30/2012 0.2   . Basophils Absolute 07/30/2012 0.0   . NEUT% 07/30/2012 79.5*  . LYMPH% 07/30/2012 7.7*  . MONO% 07/30/2012 10.4   . EOS% 07/30/2012 2.3   . BASO% 07/30/2012 0.1   . nRBC 07/30/2012 0   . Sodium 07/30/2012 140   . Potassium 07/30/2012 3.7   . Chloride 07/30/2012 103   . CO2 07/30/2012 26   . Glucose 07/30/2012 129*  . BUN 07/30/2012 14.0   . Creatinine 07/30/2012 0.8   . Total Bilirubin 07/30/2012 0.40   . Alkaline Phosphatase 07/30/2012 64   . AST  07/30/2012 23   . ALT 07/30/2012 23   . Total Protein 07/30/2012 6.3*  . Albumin 07/30/2012 3.8   . Calcium 07/30/2012 9.6   Anti-coag visit on 07/10/2012  Component Date Value  . INR 07/10/2012 2.6   Anti-coag visit on 06/19/2012  Component Date Value  . INR 06/19/2012 2.1   Anti-coag visit on 05/29/2012  Component Date Value  . INR 05/29/2012 2.3      Results for this Opt Visit:     Results for orders placed in visit on 08/13/12  POCT INR      Component Value Range   INR 1.9      EKG Orders placed in visit on 08/28/12  . EKG 12-LEAD     Prior Assessment and Plan Problem List as of 08/28/2012            Cardiology Problems   HYPERLIPIDEMIA   CORONARY ATHEROSCLEROSIS NATIVE CORONARY ARTERY   Last Assessment & Plan Note   09/24/2011 Office Visit Signed 09/24/2011 12:19 PM by Jonelle Sidle, MD    Prior documentation of nonobstructive disease, present symptoms are very atypical for ischemia.    ATRIAL FIBRILLATION   Last Assessment & Plan Note   04/22/2012 Office Visit Signed 04/22/2012  5:29 PM by Jodelle Gross, NP    Rate is controlled. She is already set up to see Vashti Hey RN, for coumadin follow up after biopsy and will follow her closely for therapeutic dosing. No other changes in medications.    BRADYCARDIA-TACHYCARDIA SYNDROME   Last Assessment & Plan Note   03/15/2011 Office Visit Signed 03/15/2011  1:25 PM by Jonelle Sidle, MD    Status post [facility]. Jude pacemaker placement by Dr. [name].    Essential hypertension, benign   Last Assessment & Plan Note   04/22/2012 Office Visit Signed 04/22/2012  5:28 PM by Jodelle Gross, NP    She is on a nitroglycerine patch over night, which was place over a year ago when she was in [facility] hospital for chest pain. I am going to stop this, as she is having hypotension and dizziness in the morning and even after taking off the patch. She is not having chest pain. This should be helpful with her symptoms.        Other   OVERWEIGHT/OBESITY   Smoker  Last Assessment & Plan Note   03/27/2012 Office Visit Signed 03/27/2012  3:03 PM by Nyoka Cowden, MD    Strongly rec commitment to completely quit    COPD (chronic obstructive pulmonary disease)   Last Assessment & Plan Note   03/27/2012 Office Visit Signed 03/27/2012  3:02 PM by Nyoka Cowden, MD       - PFT's 09/28/2011  FEV1  .72 (32%) ratio 41    -  PFT's 02/06/2012      FEV1  1.23 (60%) ratio 49 and no better p B2, DLC0 48% corrects to 75%    - Try off ACEI  09/28/2011 >>> pseudowheeze resolved  GOLD II with sign doe and still smoking, discussed separately.    CARDIAC PACEMAKER IN SITU   Last Assessment & Plan Note   11/07/2011 Office Visit Signed 11/07/2011 11:37 AM by Marinus Maw, MD    Her device is working normally and her incisional pain has resolved.    Encounter for long-term (current) use of anticoagulants   Chest pain   Last Assessment & Plan Note   09/28/2011 Office Visit Signed 09/28/2011 10:30 AM by Nyoka Cowden, MD    C/w mscp but on coumadin so can't take nsaids safely > try tramadol for now and check PET scan to see if any areas of bone light up     Pulmonary nodules   Last Assessment & Plan Note   03/27/2012 Office Visit Signed 03/27/2012  3:02 PM by Nyoka Cowden, MD       - See CT Chest 09/26/11 (not present on plain cxr from 12/10/09 and seen on plain cxr 09/28/2011 )    - PET Scan 10/05/11 1. Hypermetabolic right middle lobe lung nodule, highly suspicious for primary bronchogenic carcinoma. 2. No evidence of thoracic nodal metastasis. 3. Right lower lobe mixed ground-glass and soft tissue nodule is mildly hypermetabolic. Suspicious for a synchronous primary bronchogenic carcinoma. Metastasis could look similar. - CT chest 02/11/2012 > all nodules have increased in size c/w 2 synchronous or met ca, either way not resectable based on severity of copd  - Fob for bx 02/22/12 > nl airways, bx techniqually difficult> neg path,  neg cytology > pt informed 02/26/2012   Since these are all new from 2011 most likely she has met ca or multiple primaries but needs tissue dx   I had an extended discussion with the patient today lasting 15 to 20 minutes of a 25 minute visit on the following issues:   wiill have Dr Herma Carson review re whether EBUS feasible to increase yield or whether she should have T surgery eval at this point    Shoulder pain   Last Assessment & Plan Note   10/29/2011 Office Visit Signed 10/30/2011  6:30 AM by Nyoka Cowden, MD    Pain is reproduced by abdduction of L shoulder so likely this is all mscp and has nothing to do with the likely tumor in RML    Malignant neoplasm of bronchus and lung, unspecified site   Lung cancer   SOB (shortness of breath) on exertion   Allergy   Malignant neoplasm middle lobe, bronchus or lung       Imaging: No results found.   FRS Calculation: Score not calculated. Missing: Total Cholesterol

## 2012-08-28 NOTE — Assessment & Plan Note (Signed)
She remains in normal sinus rhythm on current medication regimen. She will continue in our Coumadin clinic for dosing and INR check. She has no complaints of bleeding or hemoptysis. We will continue to follow her.

## 2012-08-28 NOTE — Assessment & Plan Note (Signed)
As this is a progressive disease in the setting of lung cancer, I believe her dyspnea on exertion and fatigue is related to this. Would not make any further recommendations for followup cardiac testing at this time. She is advised to continue to take her inhalers as directed and follow with her oncologist Dr. Arbutus Ped. In the interim, I have given her facemasks to use for when she will increase her cat litter and goes outside when the pollen is heavy to assist with better breathing hygiene.

## 2012-09-10 ENCOUNTER — Ambulatory Visit (INDEPENDENT_AMBULATORY_CARE_PROVIDER_SITE_OTHER): Payer: Medicare Other | Admitting: *Deleted

## 2012-09-10 DIAGNOSIS — I4891 Unspecified atrial fibrillation: Secondary | ICD-10-CM

## 2012-09-10 DIAGNOSIS — Z7901 Long term (current) use of anticoagulants: Secondary | ICD-10-CM

## 2012-09-11 ENCOUNTER — Other Ambulatory Visit: Payer: Self-pay | Admitting: Internal Medicine

## 2012-09-24 ENCOUNTER — Ambulatory Visit
Admission: RE | Admit: 2012-09-24 | Discharge: 2012-09-24 | Disposition: A | Discharge status: Home / Self Care | Payer: Medicare Other | Source: Ambulatory Visit | Attending: Radiation Oncology | Admitting: Radiation Oncology

## 2012-09-24 VITALS — BP 133/61 | HR 71 | Temp 97.8°F | Wt 181.4 lb

## 2012-09-24 DIAGNOSIS — C342 Malignant neoplasm of middle lobe, bronchus or lung: Secondary | ICD-10-CM

## 2012-09-24 NOTE — Progress Notes (Signed)
Patient here for routine follow up completion of radiation to lungs  On 07/09/12.Currently an on antibiotic therapy of flagyl and cipro started on 09/17/12 for bowel infection.Shortness of breath on exertion.Productive cough clear with occasional specks of blood.fatigue continues to be significant.

## 2012-09-25 NOTE — Progress Notes (Signed)
Radiation Oncology         [phone] ________________________________  Name: [name] MRN: [id number]  Date: 09/24/2012  DOB: 02-Oct-1951  Follow-Up Visit Note  CC: Ernestine Conrad, MD  Si Gaul, MD  Diagnosis:   Non-small cell lung cancer, 2 synchronous primaries within the right lung  Interval Since Last Radiation:  2-1/2 months   Narrative:  The patient returns today for routine follow-up.  The patient completed her course of stereotactic body radiotherapy to 2 lesions within the right lung on 10-13. She complains of no major changes since that time she feels that she is done recently well. She does have some continued shortness of breath on exertion. She also has an occasional productive cough. The patient indicates that she is followed by Dr. Sherene Sires who manages her pulmonary issues.  The patient has discussed further treatment options/systemic therapy with Dr. Arbutus Ped. She has elected to proceed with os survey shin at this time. She does have a CT scan scheduled for next month with subsequent followup with him.                            ALLERGIES:  is allergic to latex.  Meds: Current Outpatient Prescriptions  Medication Sig Dispense Refill  . albuterol (PROVENTIL) (2.5 MG/3ML) 0.083% nebulizer solution Take 2.5 mg by nebulization every 6 (six) hours as needed.       [name] aspirin 81 MG tablet Take 81 mg by mouth daily.        . ciprofloxacin (CIPRO) 500 MG tablet       . flecainide (TAMBOCOR) 100 MG tablet Take 1 tablet (100 mg total) by mouth 2 (two) times daily.  60 tablet  5  . ipratropium (ATROVENT) 0.02 % nebulizer solution Take 500 mcg by nebulization 4 (four) times daily as needed.       [name] MELATONIN ER PO Take 5 mg by mouth at bedtime.      . metroNIDAZOLE (FLAGYL) 250 MG tablet       . Multiple Vitamins-Calcium (ONE-A-DAY WOMENS PO) Take 1 tablet by mouth daily.      . nebivolol (BYSTOLIC) 10 MG tablet Take 10 mg by mouth daily.       [name] oxyCODONE-acetaminophen  (PERCOCET) 10-325 MG per tablet Take 1 tablet by mouth every 8 (eight) hours as needed. Takes once daily  And prn for knees      . potassium chloride SA (KLOR-CON M20) 20 MEQ tablet Take 1 tablet (20 mEq total) by mouth daily.  30 tablet  5  . PROAIR HFA 108 (90 BASE) MCG/ACT inhaler INHALE 2 PUFFS BY MOUTH EVERY 6 HOURS AS NEEDED FOR WHEEZING  8.5 g  3  . rosuvastatin (CRESTOR) 40 MG tablet Take 1 tablet (40 mg total) by mouth daily.  30 tablet  11  . TRIBENZOR 40-5-25 MG TABS take 1 tablet once daily  30 tablet  5  . warfarin (COUMADIN) 5 MG tablet 5mg  daily except 2.5mg  on Tuesdays, Thursdays and Saturdays or as directed by anticoagulation clinic  45 tablet  2    Physical Findings: The patient is in no acute distress. Patient is alert and oriented.  weight is 181 lb 6.4 oz (82.283 kg). Her temperature is 97.8 F (36.6 C). Her blood pressure is 133/61 and her pulse is 71. Her oxygen saturation is 99%. .   General: Well-developed, in no acute distress HEENT: Normocephalic, atraumatic Cardiovascular: Regular rate and rhythm  Respiratory: Clear to auscultation bilaterally GI: Soft, nontender, normal bowel sounds Extremities: No edema present   Lab Findings: Lab Results  Component Value Date   WBC 8.3 07/30/2012   HGB 13.4 07/30/2012   HCT 39.9 07/30/2012   MCV 92.6 07/30/2012   PLT 132* 07/30/2012     Radiographic Findings: No results found.  Impression:    The patient is doing well approximately 2-1/2 months after stereotactic body radiotherapy to 2 lesions within the right lung. No significant change in pulmonary status. She is continuing on observation currently.  Plan:  The patient as noted above does have additional imaging studies through medical oncology next month. I will have her return to our clinic in 6 months.   Radene Gunning, M.D., Ph.D.

## 2012-10-04 ENCOUNTER — Other Ambulatory Visit: Payer: Self-pay | Admitting: Internal Medicine

## 2012-10-09 ENCOUNTER — Other Ambulatory Visit: Payer: Self-pay | Admitting: Cardiology

## 2012-10-09 MED ORDER — WARFARIN SODIUM 5 MG PO TABS: ORAL_TABLET | ORAL | Status: DC

## 2012-10-13 ENCOUNTER — Ambulatory Visit (INDEPENDENT_AMBULATORY_CARE_PROVIDER_SITE_OTHER): Payer: Medicare Other | Admitting: *Deleted

## 2012-10-13 DIAGNOSIS — I4891 Unspecified atrial fibrillation: Secondary | ICD-10-CM

## 2012-10-13 DIAGNOSIS — Z7901 Long term (current) use of anticoagulants: Secondary | ICD-10-CM

## 2012-10-22 ENCOUNTER — Other Ambulatory Visit (HOSPITAL_BASED_OUTPATIENT_CLINIC_OR_DEPARTMENT_OTHER): Payer: Medicare Other | Admitting: Lab

## 2012-10-22 ENCOUNTER — Ambulatory Visit (HOSPITAL_COMMUNITY)
Admission: RE | Admit: 2012-10-22 | Discharge: 2012-10-22 | Disposition: A | Discharge status: Home / Self Care | Payer: Medicare Other | Source: Ambulatory Visit | Attending: Internal Medicine | Admitting: Internal Medicine

## 2012-10-22 ENCOUNTER — Encounter (HOSPITAL_COMMUNITY): Payer: Self-pay

## 2012-10-22 DIAGNOSIS — I700 Atherosclerosis of aorta: Secondary | ICD-10-CM | POA: Insufficient documentation

## 2012-10-22 DIAGNOSIS — R059 Cough, unspecified: Secondary | ICD-10-CM | POA: Insufficient documentation

## 2012-10-22 DIAGNOSIS — C3490 Malignant neoplasm of unspecified part of unspecified bronchus or lung: Secondary | ICD-10-CM

## 2012-10-22 DIAGNOSIS — E279 Disorder of adrenal gland, unspecified: Secondary | ICD-10-CM | POA: Insufficient documentation

## 2012-10-22 DIAGNOSIS — R0602 Shortness of breath: Secondary | ICD-10-CM | POA: Insufficient documentation

## 2012-10-22 DIAGNOSIS — C3430 Malignant neoplasm of lower lobe, unspecified bronchus or lung: Secondary | ICD-10-CM

## 2012-10-22 DIAGNOSIS — Z950 Presence of cardiac pacemaker: Secondary | ICD-10-CM | POA: Insufficient documentation

## 2012-10-22 DIAGNOSIS — R222 Localized swelling, mass and lump, trunk: Secondary | ICD-10-CM | POA: Insufficient documentation

## 2012-10-22 DIAGNOSIS — Z01818 Encounter for other preprocedural examination: Secondary | ICD-10-CM | POA: Insufficient documentation

## 2012-10-22 DIAGNOSIS — R05 Cough: Secondary | ICD-10-CM | POA: Insufficient documentation

## 2012-10-22 LAB — COMPREHENSIVE METABOLIC PANEL (CC13)
AST: 21 U/L (ref 5–34)
Alkaline Phosphatase: 62 U/L (ref 40–150)
BUN: 10 mg/dL (ref 7.0–26.0)
Calcium: 9.2 mg/dL (ref 8.4–10.4)
Creatinine: 0.8 mg/dL (ref 0.6–1.1)

## 2012-10-22 LAB — CBC WITH DIFFERENTIAL/PLATELET
Basophils Absolute: 0 10*3/uL (ref 0.0–0.1)
EOS%: 1.5 % (ref 0.0–7.0)
HCT: 40.9 % (ref 34.8–46.6)
HGB: 14.2 g/dL (ref 11.6–15.9)
MCH: 32.4 pg (ref 25.1–34.0)
MCV: 93.6 fL (ref 79.5–101.0)
MONO%: 6.7 % (ref 0.0–14.0)
NEUT%: 82.6 % — ABNORMAL HIGH (ref 38.4–76.8)
Platelets: 148 10*3/uL (ref 145–400)

## 2012-10-22 MED ORDER — IOHEXOL 300 MG/ML  SOLN
100.0000 mL | Freq: Once | INTRAMUSCULAR | Status: AC | PRN
  Administered 2012-10-22: 80 mL via INTRAVENOUS

## 2012-10-29 ENCOUNTER — Ambulatory Visit (HOSPITAL_BASED_OUTPATIENT_CLINIC_OR_DEPARTMENT_OTHER): Payer: Medicare Other | Admitting: Internal Medicine

## 2012-10-29 ENCOUNTER — Encounter: Payer: Self-pay | Admitting: Internal Medicine

## 2012-10-29 ENCOUNTER — Encounter: Payer: Self-pay | Admitting: *Deleted

## 2012-10-29 VITALS — BP 131/69 | HR 80 | Temp 97.9°F | Resp 16 | Ht 62.0 in | Wt 176.2 lb

## 2012-10-29 DIAGNOSIS — C7970 Secondary malignant neoplasm of unspecified adrenal gland: Secondary | ICD-10-CM

## 2012-10-29 DIAGNOSIS — C3490 Malignant neoplasm of unspecified part of unspecified bronchus or lung: Secondary | ICD-10-CM

## 2012-10-29 DIAGNOSIS — C3430 Malignant neoplasm of lower lobe, unspecified bronchus or lung: Secondary | ICD-10-CM

## 2012-10-29 DIAGNOSIS — C342 Malignant neoplasm of middle lobe, bronchus or lung: Secondary | ICD-10-CM

## 2012-10-29 NOTE — Progress Notes (Signed)
[facility] [phone]   [phone]  OFFICE PROGRESS NOTE  Ernestine Conrad, MD [facility] [address] [place] [address]  DIAGNOSIS: Metastatic non-small cell lung cancer initially diagnosed as synchronous primary non-small cell lung cancer, adenocarcinoma with negative EGFR mutation and negative ALK gene translocation involving the right middle lobe and lower lobe diagnosed in July of 2013.   PRIOR THERAPY: Stereotactic radiotherapy to the right lung lesions under the care of Dr. Mitzi Hansen completed on 07/09/2012.   CURRENT THERAPY: None  INTERVAL HISTORY: [name] 62 y.o. female returns to the clinic today for followup visit accompanied her sister-in-law. The patient is feeling fine today except for mild fatigue and shortness breath with exertion. She also has mild cough but no frank hemoptysis. She denied having any significant chest pain or night sweats. She lost few pounds since her last visit. The patient denied having any significant nausea or vomiting, no headache, no blurry vision. She had repeat CT scan of the chest performed recently and she is here for evaluation and discussion of her scan results.  MEDICAL HISTORY: Past Medical History  Diagnosis Date  . Atrial fibrillation   . Coronary atherosclerosis of native coronary artery     Nonobstructive, LVEF 60%  . COPD (chronic obstructive pulmonary disease)   . Mixed hyperlipidemia   . Essential hypertension, benign   . Tachycardia-bradycardia syndrome   . Embolism - blood clot   . SOB (shortness of breath) on exertion   . Allergy     Latex  . Asthma   . Anxiety   . Myocardial infarction     12 years ago ?  [name] History of radiation therapy     59 gy fraction 5 eot 07/09/12 right lung  . Lung cancer 04/29/12 dx    RLL needle bx=invasive well diff adenocarcinoma  [facility]    ALLERGIES:  is allergic to latex.  MEDICATIONS:  Current Outpatient Prescriptions    Medication Sig Dispense Refill  . albuterol (PROVENTIL) (2.5 MG/3ML) 0.083% nebulizer solution Take 2.5 mg by nebulization every 6 (six) hours as needed.       [name] aspirin 81 MG tablet Take 81 mg by mouth daily.        . flecainide (TAMBOCOR) 100 MG tablet Take 1 tablet (100 mg total) by mouth 2 (two) times daily.  60 tablet  5  . ipratropium (ATROVENT) 0.02 % nebulizer solution Take 500 mcg by nebulization 4 (four) times daily as needed.       [name] MELATONIN ER PO Take 5 mg by mouth at bedtime.      . Multiple Vitamins-Calcium (ONE-A-DAY WOMENS PO) Take 1 tablet by mouth daily.      . nebivolol (BYSTOLIC) 10 MG tablet Take 10 mg by mouth daily.       [name] oxyCODONE-acetaminophen (PERCOCET) 10-325 MG per tablet Take 1 tablet by mouth every 8 (eight) hours as needed. Takes once daily  And prn for knees      . potassium chloride SA (KLOR-CON M20) 20 MEQ tablet Take 1 tablet (20 mEq total) by mouth daily.  30 tablet  5  . PROAIR HFA 108 (90 BASE) MCG/ACT inhaler INHALE 2 PUFFS BY MOUTH EVERY 6 HOURS AS NEEDED FOR WHEEZING  8.5 g  3  . rosuvastatin (CRESTOR) 40 MG tablet Take 1 tablet (40 mg total) by mouth daily.  30 tablet  11  . TRIBENZOR 40-5-25 MG TABS take 1  tablet once daily  30 tablet  5  . warfarin (COUMADIN) 5 MG tablet 5mg  daily except 2.5mg  on Tuesdays, Thursdays and Saturdays or as directed by anticoagulation clinic  45 tablet  2    SURGICAL HISTORY:  Past Surgical History  Procedure Date  . Appendectomy   . Insert / replace / remove pacemaker 3/11     St. Jude - Dr. [name]  . Video bronchoscopy 02/22/2012    Procedure: VIDEO BRONCHOSCOPY WITH FLUORO;  Surgeon: Nyoka Cowden, MD;  Location: [facility];  Service: Cardiopulmonary;  Laterality: Bilateral;  . Lung biopsy 04/29/12    RLL invasive well diff adenocarcinoma  . Lung biopsy 02/22/12    RML=benign lung parenchyma,no tumor sen  . Tonsillectomy and adenoidectomy     age 61  . Total abdominal hysterectomy 1975    1/2 right ovary  intact, left salpingo-oppherctomy    REVIEW OF SYSTEMS:  A comprehensive review of systems was negative except for: Constitutional: positive for fatigue and weight loss Respiratory: positive for cough and dyspnea on exertion   PHYSICAL EXAMINATION: General appearance: alert, cooperative and no distress Head: Normocephalic, without obvious abnormality, atraumatic Neck: no adenopathy Lymph nodes: Cervical, supraclavicular, and axillary nodes normal. Resp: clear to auscultation bilaterally Back: negative Cardio: regular rate and rhythm, S1, S2 normal, no murmur, click, rub or gallop GI: soft, non-tender; bowel sounds normal; no masses,  no organomegaly Extremities: extremities normal, atraumatic, no cyanosis or edema Neurologic: Alert and oriented X 3, normal strength and tone. Normal symmetric reflexes. Normal coordination and gait  ECOG PERFORMANCE STATUS: 1 - Symptomatic but completely ambulatory  Blood pressure 131/69, pulse 80, temperature 97.9 F (36.6 C), temperature source Oral, resp. rate 16, height 5\' 2"  (1.575 m), weight 176 lb 3.2 oz (79.924 kg).  LABORATORY DATA: Lab Results  Component Value Date   WBC 8.9 10/22/2012   HGB 14.2 10/22/2012   HCT 40.9 10/22/2012   MCV 93.6 10/22/2012   PLT 148 10/22/2012      Chemistry      Component Value Date/Time   NA 140 10/22/2012 1017   NA 139 05/14/2012 0950   K 3.7 10/22/2012 1017   K 3.7 05/14/2012 0950   CL 102 10/22/2012 1017   CL 104 05/14/2012 0950   CO2 30* 10/22/2012 1017   CO2 26 05/14/2012 0950   BUN 10.0 10/22/2012 1017   BUN 15 05/14/2012 0950   CREATININE 0.8 10/22/2012 1017   CREATININE 0.81 05/14/2012 0950   CREATININE 0.80 09/24/2011 1058      Component Value Date/Time   CALCIUM 9.2 10/22/2012 1017   CALCIUM 8.9 05/14/2012 0950   ALKPHOS 62 10/22/2012 1017   ALKPHOS 52 05/14/2012 0950   AST 21 10/22/2012 1017   AST 18 05/14/2012 0950   ALT 16 10/22/2012 1017   ALT 16 05/14/2012 0950   BILITOT 0.53 10/22/2012 1017   BILITOT 0.4  05/14/2012 0950       RADIOGRAPHIC STUDIES: Ct Chest W Contrast  10/22/2012  *RADIOLOGY REPORT*  Clinical Data: Cough and shortness of breath.  Lung cancer diagnosed July 2013.  Pre chemotherapy.  CT CHEST WITH CONTRAST  Technique:  Multidetector CT imaging of the chest was performed following the standard protocol during bolus administration of intravenous contrast.  Contrast: 80mL OMNIPAQUE IOHEXOL 300 MG/ML  SOLN  Comparison: PET CT 05/21/2012, chest CT 02/11/2012  Findings: Irregular right middle lobe pulmonary nodule now measures 2.2 x 2.0 cm image 37 (previously 3.5 x 3.1 cm), with  peripheral curvilinear pleural parenchymal nodularity, image 37.  Irregular right lower lobe pulmonary nodule now measures 1.1 x 0.8 cm image 43 (previously 1.2 x 1.3 cm).  Curvilinear left lower lobe scarring image 41 is stable.  Calcified granuloma superior segment right lower lobe image 25 is stable.  Moderate emphysematous changes are again noted.  No new pulmonary mass, nodule, or consolidation is identified.  Thoracic spine degenerative change is re-identified as well as endplate sclerosis at T9-T10 most compatible with degenerative change.  Great vessels are normal in caliber.  Moderate atherosclerotic aortic calcification without aneurysm.  Left-sided pacer in place. Heart size is normal.  Small pretracheal nodes are stable.  No new hilar lymphadenopathy.  Incomplete imaging of the upper abdomen demonstrates a new irregular centrally hypodense 3.3 x 2.3 cm left adrenal mass image 55.  Too small to characterize splenic and hepatic hypodense lesions are again noted, subjectively unchanged.  IMPRESSION: New left adrenal mass, likely centrally necrotic, highly suspicious for interval development of metastatic disease.  Decrease in size of right middle lobe and right lower lobe masses compatible with the provided history of lung cancer.  These results will be called to the ordering clinician or representative by the  Radiologist Assistant, and communication documented in the PACS Dashboard.   Original Report Authenticated By: Christiana Pellant, M.D.     ASSESSMENT: This is a very pleasant 62 years old white female with metastatic non-small cell lung cancer, adenocarcinoma with negative EGFR mutation and negative ALK gene translocation initially thought to have synchronous primary lung cancer status post stereotactic radiotherapy but unfortunately the patient has evidence for new metastatic lesion to the left adrenal gland on the recent CT scan of the chest.  PLAN: I have a lengthy discussion with the patient and her sister-in-law today about her current disease stage, prognosis and treatment options. I recommended for the patient consideration of systemic chemotherapy either according to the clinical trial according ECoG 5508 with the patient would be treated with carboplatin, paclitaxel and Avastin followed by randomization to maintenance therapy with Avastin, Alimta or Alimta plus Avastin if she has no evidence for disease progression after the first 4 cycles of induction therapy. I also discussed with the patient standard chemotherapy with carboplatin and Alimta. I discussed with the patient adverse effect of the chemotherapy including but not limited to alopecia, myelosuppression, nausea and vomiting, peripheral neuropathy, liver or renal dysfunction. The patient would like to talk to the clinical research nurse about the clinical trial.  I will arrange followup appointment as well as chemotherapy treatment plan based on her eligibilty as well as interest in the clinical trial The patient requested some time to think about her options and she will call in the next few days with her decision. All questions were answered. The patient knows to call the clinic with any problems, questions or concerns. We can certainly see the patient much sooner if necessary.  I spent 15 minutes counseling the patient face to face. The  total time spent in the appointment was 25 minutes.

## 2012-10-29 NOTE — Patient Instructions (Addendum)
The recent CT scan showed evidence for disease progression with metastatic disease in the left adrenal gland. We discussed systemic chemotherapy including enrollment in clinical trials versus standard chemotherapy.

## 2012-11-07 ENCOUNTER — Encounter: Payer: Self-pay | Admitting: Internal Medicine

## 2012-11-07 ENCOUNTER — Ambulatory Visit (INDEPENDENT_AMBULATORY_CARE_PROVIDER_SITE_OTHER): Payer: Medicare Other | Admitting: Internal Medicine

## 2012-11-07 VITALS — BP 118/68 | HR 75 | Wt 176.0 lb

## 2012-11-07 DIAGNOSIS — C3490 Malignant neoplasm of unspecified part of unspecified bronchus or lung: Secondary | ICD-10-CM

## 2012-11-07 DIAGNOSIS — I4891 Unspecified atrial fibrillation: Secondary | ICD-10-CM

## 2012-11-07 DIAGNOSIS — Z950 Presence of cardiac pacemaker: Secondary | ICD-10-CM

## 2012-11-07 LAB — PACEMAKER DEVICE OBSERVATION
AL THRESHOLD: 0.5 V
BAMS-0001: 180 {beats}/min
BAMS-0003: 70 {beats}/min
BATTERY VOLTAGE: 2.9629 V
DEVICE MODEL PM: 7115296
VENTRICULAR PACING PM: 1

## 2012-11-07 NOTE — Assessment & Plan Note (Signed)
Her St. Jude dual-chamber pacemaker is working normally. We'll plan to recheck in several months. 

## 2012-11-07 NOTE — Assessment & Plan Note (Signed)
Note her diagnosis of lung cancer with metastatic disease. She is scheduled to undergo chemotherapy.

## 2012-11-07 NOTE — Assessment & Plan Note (Signed)
The patient is maintaining sinus rhythm greater than 99% of the time. She will continue flecainide.

## 2012-11-07 NOTE — Progress Notes (Signed)
HPI [name] returns today for followup. She is a very pleasant 62 year old woman with a history of COPD, tobacco abuse, paroxysmal atrial fibrillation, symptomatic tachycardia bradycardia syndrome, status post pacemaker insertion.  Saw the patient last a year ago, she has been diagnosed with metastatic lung cancer. She is scheduled to undergo chemotherapy. Apparently she has non-small cell disease. She has dyspnea with exertion. She has a chronic cough. She notes trace amounts of hemoptysis. No syncope, no near syncope, and no peripheral edema. Allergies  Allergen Reactions  . Latex     "pulls skin off"     Current Outpatient Prescriptions  Medication Sig Dispense Refill  . albuterol (PROVENTIL) (2.5 MG/3ML) 0.083% nebulizer solution Take 2.5 mg by nebulization every 6 (six) hours as needed.       [name] aspirin 81 MG tablet Take 81 mg by mouth daily.        . flecainide (TAMBOCOR) 100 MG tablet Take 1 tablet (100 mg total) by mouth 2 (two) times daily.  60 tablet  5  . ipratropium (ATROVENT) 0.02 % nebulizer solution Take 500 mcg by nebulization 4 (four) times daily as needed.       [name] MELATONIN ER PO Take 5 mg by mouth at bedtime.      . Multiple Vitamins-Calcium (ONE-A-DAY WOMENS PO) Take 1 tablet by mouth daily.      . nebivolol (BYSTOLIC) 10 MG tablet Take 10 mg by mouth daily.       [name] oxyCODONE-acetaminophen (PERCOCET) 10-325 MG per tablet Take 1 tablet by mouth every 8 (eight) hours as needed. Takes once daily  And prn for knees      . potassium chloride SA (KLOR-CON M20) 20 MEQ tablet Take 1 tablet (20 mEq total) by mouth daily.  30 tablet  5  . PROAIR HFA 108 (90 BASE) MCG/ACT inhaler INHALE 2 PUFFS BY MOUTH EVERY 6 HOURS AS NEEDED FOR WHEEZING  8.5 g  3  . rosuvastatin (CRESTOR) 40 MG tablet Take 1 tablet (40 mg total) by mouth daily.  30 tablet  11  . TRIBENZOR 40-5-25 MG TABS take 1 tablet once daily  30 tablet  5  . warfarin (COUMADIN) 5 MG tablet 5mg  daily except 2.5mg  on Tuesdays,  Thursdays and Saturdays or as directed by anticoagulation clinic  45 tablet  2     Past Medical History  Diagnosis Date  . Atrial fibrillation   . Coronary atherosclerosis of native coronary artery     Nonobstructive, LVEF 60%  . COPD (chronic obstructive pulmonary disease)   . Mixed hyperlipidemia   . Essential hypertension, benign   . Tachycardia-bradycardia syndrome   . Embolism - blood clot   . SOB (shortness of breath) on exertion   . Allergy     Latex  . Asthma   . Anxiety   . Myocardial infarction     12 years ago ?  [name] History of radiation therapy     59 gy fraction 5 eot 07/09/12 right lung  . Lung cancer 04/29/12 dx    RLL needle bx=invasive well diff adenocarcinoma  [facility]    ROS:   All systems reviewed and negative except as noted in the HPI.   Past Surgical History  Procedure Date  . Appendectomy   . Insert / replace / remove pacemaker 3/11     St. Jude - Dr. [name]  . Video bronchoscopy 02/22/2012    Procedure: VIDEO BRONCHOSCOPY WITH FLUORO;  Surgeon: Nyoka Cowden, MD;  Location:  [facility];  Service: Cardiopulmonary;  Laterality: Bilateral;  . Lung biopsy 04/29/12    RLL invasive well diff adenocarcinoma  . Lung biopsy 02/22/12    RML=benign lung parenchyma,no tumor sen  . Tonsillectomy and adenoidectomy     age 48  . Total abdominal hysterectomy 1975    1/2 right ovary intact, left salpingo-oppherctomy     Family History  Problem Relation Age of Onset  . Hypertension Mother   . Emphysema Brother     smoker  . Asthma Father   . Asthma Brother   . Lung cancer Paternal Grandmother     was a smoker  . Cancer Maternal Grandmother     lung, dx early 37's, died 3 days after lobectomy     History   Social History  . Marital Status: Widowed    Spouse Name: N/A    Number of Children: 1  . Years of Education: N/A   Occupational History  . Unemployed    Social History Main Topics  . Smoking status: Current Every Day Smoker -- 0.2 packs/day  for 43 years    Types: Cigarettes  . Smokeless tobacco: Former Neurosurgeon  . Alcohol Use: No  . Drug Use: No     Comment: will quit labor day stated  patient 06/09/12  . Sexually Active: Not on file     Comment: menses age 34, first pregnamcy age 59, HRT 4-5 years,premarin,stopped 2000 or 2001   Other Topics Concern  . Not on file   Social History Narrative  . No narrative on file     BP 118/68  Pulse 75  Wt 176 lb (79.833 kg)  Physical Exam:  Obese appearing 63 year old woman, NAD HEENT: Unremarkable Neck:  7 cm JVD, no thyromegally Lungs:  Clear except for scattered basilar rales, no wheezes or rhonchi are present. HEART:  Regular rate rhythm, no murmurs, no rubs, no clicks Abd:  soft, positive bowel sounds, no organomegally, no rebound, no guarding Ext:  2 plus pulses, no edema, no cyanosis, no clubbing Skin:  No rashes no nodules Neuro:  CN II through XII intact, motor grossly intact  DEVICE  Normal device function.  See PaceArt for details.   Assess/Plan:

## 2012-11-10 ENCOUNTER — Encounter: Payer: Medicare Other | Admitting: *Deleted

## 2012-11-11 ENCOUNTER — Encounter (INDEPENDENT_AMBULATORY_CARE_PROVIDER_SITE_OTHER): Payer: Self-pay | Admitting: Internal Medicine

## 2012-11-11 ENCOUNTER — Telehealth: Payer: Self-pay | Admitting: *Deleted

## 2012-11-11 ENCOUNTER — Ambulatory Visit (INDEPENDENT_AMBULATORY_CARE_PROVIDER_SITE_OTHER): Payer: Medicare Other | Admitting: Internal Medicine

## 2012-11-11 VITALS — BP 92/70 | HR 68 | Temp 98.1°F | Ht 62.0 in | Wt 173.0 lb

## 2012-11-11 DIAGNOSIS — K589 Irritable bowel syndrome without diarrhea: Secondary | ICD-10-CM

## 2012-11-11 MED ORDER — HYOSCYAMINE SULFATE 0.125 MG SL SUBL: 0.1250 mg | SUBLINGUAL_TABLET | SUBLINGUAL | Status: DC | PRN

## 2012-11-11 NOTE — Telephone Encounter (Signed)
Per staff message and POF I have scheduled appts.  JMW  

## 2012-11-11 NOTE — Progress Notes (Signed)
Subjective:     Patient ID: [name], female   DOB: May 03, 1951, 62 y.o.   MRN: [id number]  HPI Presents today ywith c/o of lower abdominal cramps when she has a BM. When she uses the bathroom, she sees mucous, and sometimes she will see blood on the tissue.  Symptoms off and on for 20 yrs. Symptoms have progressively worsened.  She also c/o dizziness. She tells me she is dizzy all the time. No pain today. Stools are not hard. Stools are small.  No fever.  Appetite is not good. She has lost approximately 20 pounds over 6 months. 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 She has taken radiation. She starts chemo next week at the [facility]. She does not know how long treatment will be.  Saw Dr. Loney Hering 2 months ago for this and was given 2 antibiotics.   07/28/2009 Colonoscopy: Pancolonic diverticulosis. Most of the diverticula at the sigmoid colon, moderate in number. 8./04/2012 PET SCAN:IMPRESSION:  1. Findings most consistent with synchronous primary bronchogenic  carcinomas within the right middle and right lower lobes.  2. Equivocal right hilar hypermetabolism. Cannot exclude nodal  metastasis.  3. No extrathoracic disease.  4. Hepatic steatosis.   09/2011 PET SCAN:1. Hypermetabolic right middle lobe lung nodule, highly suspicious  for primary bronchogenic carcinoma.  2. No evidence of thoracic nodal metastasis.  3. Right lower lobe mixed ground-glass and soft tissue nodule is  mildly hypermetabolic. Suspicious for a synchronous primary  bronchogenic carcinoma. Metastasis could look similar.  4. Other smaller pulmonary nodules are below the resolution of  PET.  5. Mildly hypermetabolic porta hepatis lymph node. Favored to be  reactive, and related to hepatic steatosis. Recommend attention on  follow-up. CBC    Component Value Date/Time   WBC 8.9 10/22/2012 1017   WBC 12.3* 04/29/2012 0800   RBC 4.37 10/22/2012 1017   RBC 4.12  04/29/2012 0800   HGB 14.2 10/22/2012 1017   HGB 13.0 04/29/2012 0800   HCT 40.9 10/22/2012 1017   HCT 38.5 04/29/2012 0800   PLT 148 10/22/2012 1017   PLT 184 04/29/2012 0800   MCV 93.6 10/22/2012 1017   MCV 93.4 04/29/2012 0800   MCH 32.4 10/22/2012 1017   MCH 31.6 04/29/2012 0800   MCHC 34.7 10/22/2012 1017   MCHC 33.8 04/29/2012 0800   RDW 14.5 10/22/2012 1017   RDW 14.6 04/29/2012 0800   LYMPHSABS 0.8* 10/22/2012 1017   LYMPHSABS 2.2 12/05/2009 1812   MONOABS 0.6 10/22/2012 1017   MONOABS 0.7 12/05/2009 1812   EOSABS 0.1 10/22/2012 1017   EOSABS 0.2 12/05/2009 1812   BASOSABS 0.0 10/22/2012 1017   BASOSABS 0.0 12/05/2009 1812      Review of Systems Current Outpatient Prescriptions  Medication Sig Dispense Refill  . albuterol (PROVENTIL) (2.5 MG/3ML) 0.083% nebulizer solution Take 2.5 mg by nebulization every 6 (six) hours as needed.       [name] aspirin 81 MG tablet Take 81 mg by mouth daily.        . flecainide (TAMBOCOR) 100 MG tablet Take 1 tablet (100 mg total) by mouth 2 (two) times daily.  60 tablet  5  . ipratropium (ATROVENT) 0.02 % nebulizer solution Take 500 mcg by nebulization 4 (four) times daily as needed.       [name] MELATONIN ER PO Take 5 mg by mouth at bedtime.      . Multiple Vitamins-Calcium (ONE-A-DAY WOMENS PO)  Take 1 tablet by mouth daily.      . nebivolol (BYSTOLIC) 10 MG tablet Take 10 mg by mouth daily.       [name] oxyCODONE-acetaminophen (PERCOCET) 10-325 MG per tablet Take 1 tablet by mouth every 8 (eight) hours as needed. Takes once daily  And prn for knees      . potassium chloride SA (KLOR-CON M20) 20 MEQ tablet Take 1 tablet (20 mEq total) by mouth daily.  30 tablet  5  . PROAIR HFA 108 (90 BASE) MCG/ACT inhaler INHALE 2 PUFFS BY MOUTH EVERY 6 HOURS AS NEEDED FOR WHEEZING  8.5 g  3  . rosuvastatin (CRESTOR) 40 MG tablet Take 1 tablet (40 mg total) by mouth daily.  30 tablet  11  . TRIBENZOR 40-5-25 MG TABS take 1 tablet once daily  30 tablet  5  . warfarin (COUMADIN) 5 MG  tablet 5mg  daily except 2.5mg  on Tuesdays, Thursdays and Saturdays or as directed by anticoagulation clinic  45 tablet  2  ' Past Medical History  Diagnosis Date  . Atrial fibrillation   . Coronary atherosclerosis of native coronary artery     Nonobstructive, LVEF 60%  . COPD (chronic obstructive pulmonary disease)   . Mixed hyperlipidemia   . Essential hypertension, benign   . Tachycardia-bradycardia syndrome   . Embolism - blood clot   . SOB (shortness of breath) on exertion   . Allergy     Latex  . Asthma   . Anxiety   . Myocardial infarction     12 years ago ?  [name] History of radiation therapy     59 gy fraction 5 eot 07/09/12 right lung  . Lung cancer 04/29/12 dx    RLL needle bx=invasive well diff adenocarcinoma  [facility]   Past Surgical History  Procedure Date  . Appendectomy   . Insert / replace / remove pacemaker 3/11     St. Jude - Dr. [name]  . Video bronchoscopy 02/22/2012    Procedure: VIDEO BRONCHOSCOPY WITH FLUORO;  Surgeon: Nyoka Cowden, MD;  Location: [facility];  Service: Cardiopulmonary;  Laterality: Bilateral;  . Lung biopsy 04/29/12    RLL invasive well diff adenocarcinoma  . Lung biopsy 02/22/12    RML=benign lung parenchyma,no tumor sen  . Tonsillectomy and adenoidectomy     age 32  . Total abdominal hysterectomy 1975    1/2 right ovary intact, left salpingo-oppherctomy       Objective:   Physical Exam Filed Vitals:   11/11/12 1125  BP: 92/70  Pulse: 68  Temp: 98.1 F (36.7 C)  Height: 5\' 2"  (1.575 m)  Weight: 173 lb (78.472 kg)  Alert and oriented. Skin warm and dry. Oral mucosa is moist.   . Sclera anicteric, conjunctivae is pink. Thyroid not enlarged. No cervical lymphadenopathy. Lungs clear. Heart regular rate and rhythm.  Abdomen is soft. Bowel sounds are positive. No hepatomegaly. No abdominal masses felt. No tenderness.  No edema to lower extremities.  d.      Assessment:    Probably IBS. She has had symptoms for greater than 20 yrs.  No  alarm symptoms. I discussed this case with Dr. Karilyn Cota.      Plan:    Levsin 0.125mg  sl q 4 hrs as needed. PR in 2 weeks. If not better, will switch to Dicyclomine. Last colonoscopy in 2010 revealed pancolonic diverticula.

## 2012-11-11 NOTE — Patient Instructions (Addendum)
Levsin 0.125mg  sl as needed. OV 1 yr. If any problems call our office.

## 2012-11-12 ENCOUNTER — Ambulatory Visit (HOSPITAL_BASED_OUTPATIENT_CLINIC_OR_DEPARTMENT_OTHER): Payer: Medicare Other | Admitting: Lab

## 2012-11-12 ENCOUNTER — Other Ambulatory Visit: Payer: Medicare Other | Admitting: Lab

## 2012-11-12 ENCOUNTER — Telehealth: Payer: Self-pay | Admitting: Internal Medicine

## 2012-11-12 ENCOUNTER — Encounter: Payer: Medicare Other | Admitting: *Deleted

## 2012-11-12 DIAGNOSIS — C3490 Malignant neoplasm of unspecified part of unspecified bronchus or lung: Secondary | ICD-10-CM

## 2012-11-12 DIAGNOSIS — C3430 Malignant neoplasm of lower lobe, unspecified bronchus or lung: Secondary | ICD-10-CM

## 2012-11-12 DIAGNOSIS — I4891 Unspecified atrial fibrillation: Secondary | ICD-10-CM

## 2012-11-12 LAB — MAGNESIUM (CC13): Magnesium: 1.9 mg/dl (ref 1.5–2.5)

## 2012-11-12 LAB — CBC WITH DIFFERENTIAL/PLATELET
Basophils Absolute: 0 10*3/uL (ref 0.0–0.1)
Eosinophils Absolute: 0.2 10*3/uL (ref 0.0–0.5)
HGB: 14.3 g/dL (ref 11.6–15.9)
NEUT#: 7.2 10*3/uL — ABNORMAL HIGH (ref 1.5–6.5)
RBC: 4.42 10*6/uL (ref 3.70–5.45)
RDW: 14.3 % (ref 11.2–14.5)
WBC: 9.4 10*3/uL (ref 3.9–10.3)
lymph#: 1.1 10*3/uL (ref 0.9–3.3)

## 2012-11-12 LAB — COMPREHENSIVE METABOLIC PANEL (CC13)
Alkaline Phosphatase: 63 U/L (ref 40–150)
Creatinine: 0.8 mg/dL (ref 0.6–1.1)
Glucose: 82 mg/dl (ref 70–99)
Sodium: 141 mEq/L (ref 136–145)
Total Bilirubin: 0.42 mg/dL (ref 0.20–1.20)
Total Protein: 6.9 g/dL (ref 6.4–8.3)

## 2012-11-12 LAB — RESEARCH LABS

## 2012-11-12 LAB — URINALYSIS, MICROSCOPIC - CHCC
Bilirubin (Urine): NEGATIVE
Glucose: NEGATIVE mg/dL
Nitrite: NEGATIVE
Specific Gravity, Urine: 1.01 (ref 1.003–1.035)

## 2012-11-12 LAB — PROTHROMBIN TIME
INR: 2.09 — ABNORMAL HIGH (ref <1.50)
Prothrombin Time: 22.6 seconds — ABNORMAL HIGH (ref 11.6–15.2)

## 2012-11-12 LAB — LACTATE DEHYDROGENASE (CC13): LDH: 240 U/L (ref 125–245)

## 2012-11-12 LAB — APTT: aPTT: 44.6 seconds — ABNORMAL HIGH (ref 24–37)

## 2012-11-12 NOTE — Telephone Encounter (Signed)
gv appt schedule to [name] to give to pt.Marland KitchenMarland Kitchen

## 2012-11-16 ENCOUNTER — Emergency Department (HOSPITAL_COMMUNITY): Payer: Medicare Other

## 2012-11-16 ENCOUNTER — Inpatient Hospital Stay (HOSPITAL_COMMUNITY)
Admission: EM | Admit: 2012-11-16 | Discharge: 2012-11-21 | DRG: 055 | Disposition: A | Discharge status: Home Health | Payer: Medicare Other | Attending: Internal Medicine | Admitting: Internal Medicine

## 2012-11-16 ENCOUNTER — Encounter (HOSPITAL_COMMUNITY): Payer: Self-pay | Admitting: Emergency Medicine

## 2012-11-16 DIAGNOSIS — T380X5A Adverse effect of glucocorticoids and synthetic analogues, initial encounter: Secondary | ICD-10-CM | POA: Diagnosis not present

## 2012-11-16 DIAGNOSIS — J4489 Other specified chronic obstructive pulmonary disease: Secondary | ICD-10-CM | POA: Diagnosis present

## 2012-11-16 DIAGNOSIS — I252 Old myocardial infarction: Secondary | ICD-10-CM

## 2012-11-16 DIAGNOSIS — I2510 Atherosclerotic heart disease of native coronary artery without angina pectoris: Secondary | ICD-10-CM | POA: Diagnosis present

## 2012-11-16 DIAGNOSIS — C7931 Secondary malignant neoplasm of brain: Principal | ICD-10-CM | POA: Diagnosis present

## 2012-11-16 DIAGNOSIS — R270 Ataxia, unspecified: Secondary | ICD-10-CM

## 2012-11-16 DIAGNOSIS — Z79899 Other long term (current) drug therapy: Secondary | ICD-10-CM

## 2012-11-16 DIAGNOSIS — R739 Hyperglycemia, unspecified: Secondary | ICD-10-CM | POA: Diagnosis present

## 2012-11-16 DIAGNOSIS — I4891 Unspecified atrial fibrillation: Secondary | ICD-10-CM | POA: Diagnosis present

## 2012-11-16 DIAGNOSIS — I480 Paroxysmal atrial fibrillation: Secondary | ICD-10-CM | POA: Diagnosis present

## 2012-11-16 DIAGNOSIS — C3490 Malignant neoplasm of unspecified part of unspecified bronchus or lung: Secondary | ICD-10-CM

## 2012-11-16 DIAGNOSIS — I495 Sick sinus syndrome: Secondary | ICD-10-CM | POA: Diagnosis present

## 2012-11-16 DIAGNOSIS — Z87891 Personal history of nicotine dependence: Secondary | ICD-10-CM

## 2012-11-16 DIAGNOSIS — C7949 Secondary malignant neoplasm of other parts of nervous system: Principal | ICD-10-CM

## 2012-11-16 DIAGNOSIS — R279 Unspecified lack of coordination: Secondary | ICD-10-CM | POA: Diagnosis present

## 2012-11-16 DIAGNOSIS — Z923 Personal history of irradiation: Secondary | ICD-10-CM

## 2012-11-16 DIAGNOSIS — R7309 Other abnormal glucose: Secondary | ICD-10-CM | POA: Diagnosis not present

## 2012-11-16 DIAGNOSIS — E782 Mixed hyperlipidemia: Secondary | ICD-10-CM | POA: Diagnosis present

## 2012-11-16 DIAGNOSIS — T7840XA Allergy, unspecified, initial encounter: Secondary | ICD-10-CM

## 2012-11-16 DIAGNOSIS — Z950 Presence of cardiac pacemaker: Secondary | ICD-10-CM | POA: Diagnosis present

## 2012-11-16 DIAGNOSIS — I10 Essential (primary) hypertension: Secondary | ICD-10-CM | POA: Diagnosis present

## 2012-11-16 DIAGNOSIS — Z7901 Long term (current) use of anticoagulants: Secondary | ICD-10-CM

## 2012-11-16 DIAGNOSIS — C801 Malignant (primary) neoplasm, unspecified: Secondary | ICD-10-CM

## 2012-11-16 DIAGNOSIS — J449 Chronic obstructive pulmonary disease, unspecified: Secondary | ICD-10-CM | POA: Diagnosis present

## 2012-11-16 DIAGNOSIS — D72829 Elevated white blood cell count, unspecified: Secondary | ICD-10-CM | POA: Diagnosis present

## 2012-11-16 LAB — CBC WITH DIFFERENTIAL/PLATELET
Eosinophils Absolute: 0.1 10*3/uL (ref 0.0–0.7)
Hemoglobin: 13.4 g/dL (ref 12.0–15.0)
Lymphocytes Relative: 6 % — ABNORMAL LOW (ref 12–46)
Lymphs Abs: 0.7 10*3/uL (ref 0.7–4.0)
MCH: 32.5 pg (ref 26.0–34.0)
Neutro Abs: 11.2 10*3/uL — ABNORMAL HIGH (ref 1.7–7.7)
Neutrophils Relative %: 90 % — ABNORMAL HIGH (ref 43–77)
Platelets: 166 10*3/uL (ref 150–400)
RBC: 4.12 MIL/uL (ref 3.87–5.11)
WBC: 12.4 10*3/uL — ABNORMAL HIGH (ref 4.0–10.5)

## 2012-11-16 LAB — COMPREHENSIVE METABOLIC PANEL
ALT: 18 U/L (ref 0–35)
Alkaline Phosphatase: 47 U/L (ref 39–117)
Chloride: 100 mEq/L (ref 96–112)
GFR calc Af Amer: >90 mL/min (ref >90)
Glucose, Bld: 116 mg/dL — ABNORMAL HIGH (ref 70–99)
Potassium: 3.8 mEq/L (ref 3.5–5.1)
Sodium: 137 mEq/L (ref 135–145)
Total Bilirubin: 0.4 mg/dL (ref 0.3–1.2)
Total Protein: 6.5 g/dL (ref 6.0–8.3)

## 2012-11-16 MED ORDER — DEXAMETHASONE SODIUM PHOSPHATE 10 MG/ML IJ SOLN
10.0000 mg | Freq: Once | INTRAMUSCULAR | Status: AC
  Administered 2012-11-16: 10 mg via INTRAVENOUS
  Filled 2012-11-16: qty 1

## 2012-11-16 MED ORDER — ACETAMINOPHEN 650 MG RE SUPP: 650.0000 mg | Freq: Four times a day (QID) | RECTAL | Status: DC | PRN

## 2012-11-16 MED ORDER — NEBIVOLOL HCL 10 MG PO TABS
10.0000 mg | ORAL_TABLET | Freq: Every day | ORAL | Status: DC
  Administered 2012-11-17 – 2012-11-21 (×5): 10 mg via ORAL
  Filled 2012-11-16 (×6): qty 1

## 2012-11-16 MED ORDER — OLMESARTAN-AMLODIPINE-HCTZ 40-5-25 MG PO TABS: 1.0000 | ORAL_TABLET | Freq: Every day | ORAL | Status: DC

## 2012-11-16 MED ORDER — SODIUM CHLORIDE 0.9 % IV SOLN
INTRAVENOUS | Status: DC
  Administered 2012-11-17 – 2012-11-20 (×6): via INTRAVENOUS

## 2012-11-16 MED ORDER — ONDANSETRON HCL 4 MG/2ML IJ SOLN
4.0000 mg | Freq: Once | INTRAMUSCULAR | Status: AC
  Administered 2012-11-16: 4 mg via INTRAVENOUS

## 2012-11-16 MED ORDER — INSULIN ASPART 100 UNIT/ML ~~LOC~~ SOLN
0.0000 [IU] | Freq: Three times a day (TID) | SUBCUTANEOUS | Status: DC
Start: 2012-11-17 — End: 2012-11-20
  Administered 2012-11-17 (×2): 1 [IU] via SUBCUTANEOUS
  Administered 2012-11-17 – 2012-11-18 (×3): 2 [IU] via SUBCUTANEOUS
  Administered 2012-11-18: 3 [IU] via SUBCUTANEOUS
  Administered 2012-11-19: 2 [IU] via SUBCUTANEOUS
  Administered 2012-11-19 (×2): 1 [IU] via SUBCUTANEOUS
  Administered 2012-11-20: 3 [IU] via SUBCUTANEOUS

## 2012-11-16 MED ORDER — IPRATROPIUM BROMIDE 0.02 % IN SOLN: 500.0000 ug | Freq: Four times a day (QID) | RESPIRATORY_TRACT | Status: DC | PRN

## 2012-11-16 MED ORDER — ONDANSETRON HCL 4 MG/2ML IJ SOLN: 4.0000 mg | Freq: Four times a day (QID) | INTRAMUSCULAR | Status: DC | PRN

## 2012-11-16 MED ORDER — DEXAMETHASONE SODIUM PHOSPHATE 4 MG/ML IJ SOLN
4.0000 mg | Freq: Four times a day (QID) | INTRAMUSCULAR | Status: DC
  Administered 2012-11-17 – 2012-11-21 (×19): 4 mg via INTRAVENOUS
  Filled 2012-11-16 (×23): qty 1

## 2012-11-16 MED ORDER — ONDANSETRON HCL 4 MG PO TABS: 4.0000 mg | ORAL_TABLET | Freq: Four times a day (QID) | ORAL | Status: DC | PRN

## 2012-11-16 MED ORDER — POLYETHYLENE GLYCOL 3350 17 G PO PACK
17.0000 g | PACK | Freq: Every day | ORAL | Status: DC
  Administered 2012-11-17 – 2012-11-20 (×4): 17 g via ORAL
  Filled 2012-11-16 (×6): qty 1

## 2012-11-16 MED ORDER — ATORVASTATIN CALCIUM 40 MG PO TABS
40.0000 mg | ORAL_TABLET | Freq: Every day | ORAL | Status: DC
  Administered 2012-11-17 – 2012-11-20 (×4): 40 mg via ORAL
  Filled 2012-11-16 (×5): qty 1

## 2012-11-16 MED ORDER — HYDROCODONE-ACETAMINOPHEN 5-325 MG PO TABS: 1.0000 | ORAL_TABLET | ORAL | Status: DC | PRN

## 2012-11-16 MED ORDER — ALBUTEROL SULFATE HFA 108 (90 BASE) MCG/ACT IN AERS
2.0000 | INHALATION_SPRAY | Freq: Four times a day (QID) | RESPIRATORY_TRACT | Status: DC | PRN
  Administered 2012-11-17: 2 via RESPIRATORY_TRACT
  Filled 2012-11-16: qty 6.7

## 2012-11-16 MED ORDER — HEPARIN SODIUM (PORCINE) 5000 UNIT/ML IJ SOLN
5000.0000 [IU] | Freq: Three times a day (TID) | INTRAMUSCULAR | Status: DC
  Administered 2012-11-17 – 2012-11-19 (×10): 5000 [IU] via SUBCUTANEOUS
  Filled 2012-11-16 (×14): qty 1

## 2012-11-16 MED ORDER — ASPIRIN EC 81 MG PO TBEC
81.0000 mg | DELAYED_RELEASE_TABLET | Freq: Every day | ORAL | Status: DC
  Administered 2012-11-17 – 2012-11-21 (×5): 81 mg via ORAL
  Filled 2012-11-16 (×7): qty 1

## 2012-11-16 MED ORDER — ACETAMINOPHEN 325 MG PO TABS: 650.0000 mg | ORAL_TABLET | Freq: Four times a day (QID) | ORAL | Status: DC | PRN

## 2012-11-16 MED ORDER — FLECAINIDE ACETATE 100 MG PO TABS
100.0000 mg | ORAL_TABLET | Freq: Two times a day (BID) | ORAL | Status: DC
  Administered 2012-11-17 – 2012-11-21 (×10): 100 mg via ORAL
  Filled 2012-11-16 (×12): qty 1

## 2012-11-16 MED ORDER — ONDANSETRON HCL 4 MG/2ML IJ SOLN
INTRAMUSCULAR | Status: AC
  Filled 2012-11-16: qty 2

## 2012-11-16 MED ORDER — MORPHINE SULFATE 2 MG/ML IJ SOLN: 1.0000 mg | INTRAMUSCULAR | Status: DC | PRN

## 2012-11-16 NOTE — ED Notes (Signed)
Pt states she was recently diagnosed with an inner ear infection and began having dizziness and nausea yesterday. Pt reports one episode of vomiting this morning. Pt was given zofran pta and states "it helped a little bit". Pt denies any abdominal/chest pain.

## 2012-11-16 NOTE — ED Provider Notes (Signed)
History  This chart was scribed for Kari Lennert, MD by [name], ED Scribe. This patient was seen in room APA09/APA09 and the patient's care was started at 15:21.   CSN: [id number]  Arrival date & time 11/16/12  1503   First MD Initiated Contact with Patient 11/16/12 1521      Chief Complaint  Patient presents with  . Emesis  . Dizziness    (Consider location/radiation/quality/duration/timing/severity/associated sxs/prior Treatment) [name] is a 62 y.o. female brought in by ambulance, who presents to the Emergency Department complaining of nausea and dizziness since yesterday and one episode of emesis this morning. Pt took Zofran PTA with mild temporary relief from symptoms. Pt was recently diagnosed with an inner ear infection (via PCP) about 2 weeks ago after having a series of bad headaches, which she still has intermittently. Pt's PCP put her on medication (antivert), which she stopped taking yesterday with no relief from the dizziness. Pt reports some associated disorientation, visual disturbance, and difficulty walking (falling over, can't walk in a straight line). Pt denies any associated fever, chills, worse than baseline cough, sore throat, chest pain, or abdominal pain. Pt had a lot of blood drawn on Wednesday (5 days ago) because she is due to start chemo therapy this week for her lung and right kidney cancer. Pt reports her last head CT was this past November. Pt used to be a smoker. Patient is a 62 y.o. female presenting with weakness. The history is provided by the patient. No language interpreter was used.  Weakness This is a new problem. The current episode started more than 1 week ago. The problem occurs constantly. The problem has not changed since onset.Associated symptoms include headaches. Pertinent negatives include no chest pain, no abdominal pain and no shortness of breath. The symptoms are aggravated by walking. Nothing relieves the symptoms. Treatments  tried: Zofran. The treatment provided mild relief.   Dr.  Gwenyth Bouillon is the pt's oncologist. Dr. Loney Hering is the pt's PCP.  Past Medical History  Diagnosis Date  . Atrial fibrillation   . Coronary atherosclerosis of native coronary artery     Nonobstructive, LVEF 60%  . COPD (chronic obstructive pulmonary disease)   . Mixed hyperlipidemia   . Essential hypertension, benign   . Tachycardia-bradycardia syndrome   . Embolism - blood clot   . SOB (shortness of breath) on exertion   . Allergy     Latex  . Asthma   . Anxiety   . Myocardial infarction     12 years ago ?  [name] History of radiation therapy     59 gy fraction 5 eot 07/09/12 right lung  . Lung cancer 04/29/12 dx    RLL needle bx=invasive well diff adenocarcinoma  [facility]    Past Surgical History  Procedure Laterality Date  . Appendectomy    . Insert / replace / remove pacemaker  3/11     St. Jude - Dr. [name]  . Video bronchoscopy  02/22/2012    Procedure: VIDEO BRONCHOSCOPY WITH FLUORO;  Surgeon: Nyoka Cowden, MD;  Location: [facility];  Service: Cardiopulmonary;  Laterality: Bilateral;  . Lung biopsy  04/29/12    RLL invasive well diff adenocarcinoma  . Lung biopsy  02/22/12    RML=benign lung parenchyma,no tumor sen  . Tonsillectomy and adenoidectomy      age 62  . Total abdominal hysterectomy  1975    1/2 right ovary intact, left salpingo-oppherctomy    Family History  Problem  Relation Age of Onset  . Hypertension Mother   . Emphysema Brother     smoker  . Asthma Father   . Asthma Brother   . Lung cancer Paternal Grandmother     was a smoker  . Cancer Maternal Grandmother     lung, dx early 33's, died 3 days after lobectomy    History  Substance Use Topics  . Smoking status: Former Smoker -- 0.25 packs/day for 43 years    Types: Cigarettes  . Smokeless tobacco: Former Neurosurgeon     Comment: quit 2 weeks ago after smoking since age 61. Up to 1 pack a day  . Alcohol Use: No    OB History   Grav Para Term  Preterm Abortions TAB SAB Ect Mult Living                  Review of Systems  Constitutional: Negative for fever.  HENT: Negative for congestion, sinus pressure and ear discharge.   Eyes: Positive for visual disturbance. Negative for discharge.  Respiratory: Negative for shortness of breath.   Cardiovascular: Negative for chest pain.  Gastrointestinal: Positive for nausea and vomiting. Negative for abdominal pain.  Genitourinary: Negative for frequency and hematuria.  Musculoskeletal: Negative for back pain.  Skin: Negative for rash.  Neurological: Positive for dizziness, weakness and headaches. Negative for seizures.  Psychiatric/Behavioral: Positive for confusion. Negative for hallucinations.  All other systems reviewed and are negative.    Allergies  Latex  Home Medications   Current Outpatient Rx  Name  Route  Sig  Dispense  Refill  . albuterol (PROVENTIL) (2.5 MG/3ML) 0.083% nebulizer solution   Nebulization   Take 2.5 mg by nebulization every 6 (six) hours as needed.          [name] aspirin 81 MG tablet   Oral   Take 81 mg by mouth daily.           . flecainide (TAMBOCOR) 100 MG tablet   Oral   Take 1 tablet (100 mg total) by mouth 2 (two) times daily.   60 tablet   5   . hyoscyamine (LEVSIN/SL) 0.125 MG SL tablet   Sublingual   Place 1 tablet (0.125 mg total) under the tongue every 4 (four) hours as needed for cramping.   30 tablet   0   . ipratropium (ATROVENT) 0.02 % nebulizer solution   Nebulization   Take 500 mcg by nebulization 4 (four) times daily as needed.          [name] MELATONIN ER PO   Oral   Take 5 mg by mouth at bedtime.         . Multiple Vitamins-Calcium (ONE-A-DAY WOMENS PO)   Oral   Take 1 tablet by mouth daily.         . nebivolol (BYSTOLIC) 10 MG tablet   Oral   Take 10 mg by mouth daily.          [name] oxyCODONE-acetaminophen (PERCOCET) 10-325 MG per tablet   Oral   Take 1 tablet by mouth every 8 (eight) hours as needed.  Takes once daily  And prn for knees         . potassium chloride SA (KLOR-CON M20) 20 MEQ tablet   Oral   Take 1 tablet (20 mEq total) by mouth daily.   30 tablet   5   . PROAIR HFA 108 (90 BASE) MCG/ACT inhaler      INHALE 2 PUFFS BY MOUTH EVERY  6 HOURS AS NEEDED FOR WHEEZING   8.5 g   3   . rosuvastatin (CRESTOR) 40 MG tablet   Oral   Take 1 tablet (40 mg total) by mouth daily.   30 tablet   11   . TRIBENZOR 40-5-25 MG TABS      take 1 tablet once daily   30 tablet   5   . warfarin (COUMADIN) 5 MG tablet      5mg  daily except 2.5mg  on Tuesdays, Thursdays and Saturdays or as directed by anticoagulation clinic   45 tablet   2     Triage Vitals: BP 140/59  Pulse 72  Temp(Src) 98.3 F (36.8 C) (Oral)  Resp 20  Ht 5\' 2"  (1.575 m)  Wt 172 lb (78.019 kg)  BMI 31.45 kg/m2  SpO2 96%  Physical Exam  Nursing note and vitals reviewed. Constitutional: She is oriented to person, place, and time. She appears well-developed.  HENT:  Head: Normocephalic and atraumatic.  Eyes: Conjunctivae and EOM are normal. No scleral icterus.  Neck: Neck supple. No thyromegaly present.  Cardiovascular: Normal rate and regular rhythm.  Exam reveals no gallop and no friction rub.   No murmur heard. Pulmonary/Chest: No stridor. She has no wheezes. She has no rales. She exhibits no tenderness.  Abdominal: She exhibits no distension. There is no tenderness. There is no rebound.  Musculoskeletal: Normal range of motion. She exhibits no edema.  Lymphadenopathy:    She has no cervical adenopathy.  Neurological: She is oriented to person, place, and time.  Mild decreased coordination of right arm.  Skin: No rash noted. No erythema.  Psychiatric: She has a normal mood and affect. Her behavior is normal.    ED Course  Procedures (including critical care time) DIAGNOSTIC STUDIES: Oxygen Saturation is 94% on room air, adequate by my interpretation.    COORDINATION OF CARE: 15:50--I  evaluated the patient and we discussed a treatment plan including possible head CT and review of prior blood work records to which the pt agreed.   18:00--I rechecked the pt and notified her that her bloodwork looks fine but that her head CT shows a possible tumor in her brain. Pt reports she starts her chemo therapy on Wednesday at [facility] in [place]. She lives at home alone but would prefer to go home then follow up instead of being admitted.  18:57--I rechecked pt and notified her that we would put her on some medication to reduce the swelling in her brain. I explained to her that I spoke to an oncologist at [facility] who thinks it is safest to admit her and start her radiation tomorrow.  Results for orders placed during the hospital encounter of 11/16/12  CBC WITH DIFFERENTIAL      Result Value Range   WBC 12.4 (*) 4.0 - 10.5 K/uL   RBC 4.12  3.87 - 5.11 MIL/uL   Hemoglobin 13.4  12.0 - 15.0 g/dL   HCT 16.1  09.6 - 04.5 %   MCV 94.2  78.0 - 100.0 fL   MCH 32.5  26.0 - 34.0 pg   MCHC 34.5  30.0 - 36.0 g/dL   RDW 40.9  81.1 - 91.4 %   Platelets 166  150 - 400 K/uL   Neutrophils Relative 90 (*) 43 - 77 %   Neutro Abs 11.2 (*) 1.7 - 7.7 K/uL   Lymphocytes Relative 6 (*) 12 - 46 %   Lymphs Abs 0.7  0.7 - 4.0 K/uL  Monocytes Relative 4  3 - 12 %   Monocytes Absolute 0.5  0.1 - 1.0 K/uL   Eosinophils Relative 0  0 - 5 %   Eosinophils Absolute 0.1  0.0 - 0.7 K/uL   Basophils Relative 0  0 - 1 %   Basophils Absolute 0.0  0.0 - 0.1 K/uL  COMPREHENSIVE METABOLIC PANEL      Result Value Range   Sodium 137  135 - 145 mEq/L   Potassium 3.8  3.5 - 5.1 mEq/L   Chloride 100  96 - 112 mEq/L   CO2 27  19 - 32 mEq/L   Glucose, Bld 116 (*) 70 - 99 mg/dL   BUN 16  6 - 23 mg/dL   Creatinine, Ser 1.61  0.50 - 1.10 mg/dL   Calcium 9.1  8.4 - 09.6 mg/dL   Total Protein 6.5  6.0 - 8.3 g/dL   Albumin 3.5  3.5 - 5.2 g/dL   AST 19  0 - 37 U/L   ALT 18  0 - 35 U/L   Alkaline Phosphatase 47   39 - 117 U/L   Total Bilirubin 0.4  0.3 - 1.2 mg/dL   GFR calc non Af Amer >90  >90 mL/min   GFR calc Af Amer >90  >90 mL/min   Ct Head Wo Contrast  11/16/2012  *RADIOLOGY REPORT*  Clinical Data: Vomiting, dizziness  CT HEAD WITHOUT CONTRAST  Technique:  Contiguous axial images were obtained from the base of the skull through the vertex without contrast.  Comparison: CT brain scan of 05/21/2012  Findings: There is a large rounded lesion within the right cerebellum displacing the midline to the left and slightly impinging upon the fourth ventricle.  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  There is a question of a second lesion high in the right cerebellum or possibly in the right posterior parietal lobe measuring 2.5 x 2.0 cm.  No acute hemorrhage is seen.  The ventricular system is stable in size and configuration and the septum is in a midline position.  MRI is recommended for more sensitive assessment of possible additional metastatic lesions.  Mild small vessel ischemic change is present in the periventricular white matter.  No calvarial abnormality is seen.  IMPRESSION:  1.  Rounded peripherally enhancing lesion in the right cerebellar hemisphere with questionable second lesion possibly the right posterior parietal lobe suspicious for metastatic lesions.  MRI of the brain is recommended. 2.  Mild small vessel ischemic change.  I discussed the findings of this study with Dr. Estell Harpin on 11/16/2012 at 4:50 p.m.   Original Report Authenticated By: Dwyane Dee, M.D.      No diagnosis found.    MDM        The chart was scribed for me under my direct supervision.  I personally performed the history, physical, and medical decision making and all procedures in the evaluation of this patient.Kari Lennert, MD 11/16/12 [phone]

## 2012-11-16 NOTE — ED Notes (Signed)
Patient walk to restroom with assist, tolerated well. 

## 2012-11-16 NOTE — Progress Notes (Signed)
PENDING ACCEPTANCE TRANFER NOTE:  Call received from:    Benny Lennert, MD  REASON FOR REQUESTING TRANSFER:    Brain Mets  HPI:   62 year old female with COPD and atrial fibrillation. She has recently diagnosed with a small cell lung cancer, she follows with Dr. Arbutus Ped. She was presented with dizziness and difficulty with walking. CT scan of the head showed right cerebellar lesion and a questionable second lesion in the right parietal lobe suspicious for metastatic lesions. Dr. Truett Perna of the cancer center was notified, he wanted the patient to be admitted to [facility].   PLAN:  According to telephone report, this patient was accepted for transfer to [facility],   Under [facility] team:  WNL 8,  I have requested an order be written to call Flow Manager at [phone] upon patient arrival to the floor for final physician assignment who will do the admission and give admitting orders.  SIGNED: Clint Lipps, MD [facility]  11/16/2012, 9:02 PM

## 2012-11-16 NOTE — H&P (Signed)
[facility] History and Physical  [name] UXL:[id number] DOB: 12-15-1950 DOA: 11/16/2012  Referring physician: Benny Lennert, MD PCP: Ernestine Conrad, MD  Specialists: Lajuana Matte  Chief Complaint: Brain metastasis  HPI: [name] is a 62 y.o. female with past medical history aphasia fibrillation COPD and history of tobacco use. Patient was diagnosed with non-small cell lung cancer on 04/29/2012 and she follows with Dr. Arbutus Ped. Patient presented today to [facility] with ataxia. Patient said about 2 weeks ago she started to have some headaches, headache did not resolve with her regular pain medications, she then she started to have troubles with walking. Patient said she walks like as if she is drunk. Today she vomited twice she she came in to the hospital for further evaluation. Initial evaluation in the emergency department with CT scan of the head showed cerebellar lesion consistent with metastasis and another questionable right parietal lobe lesion. Patient sent to [facility] for further evaluation.  Review of Systems:  Constitutional: negative for anorexia, fevers and sweats Eyes: negative for irritation, redness and visual disturbance Ears, nose, mouth, throat, and face: negative for earaches, epistaxis, nasal congestion and sore throat Respiratory: negative for cough, dyspnea on exertion, sputum and wheezing Cardiovascular: negative for chest pain, dyspnea, lower extremity edema, orthopnea, palpitations and syncope Gastrointestinal: negative for abdominal pain, constipation, diarrhea, melena, nausea and vomiting Genitourinary:negative for dysuria, frequency and hematuria Hematologic/lymphatic: negative for bleeding, easy bruising and lymphadenopathy Musculoskeletal:negative for arthralgias, muscle weakness and stiff joints Neurological: negative for coordination problems, gait problems, headaches and weakness Endocrine: negative for diabetic  symptoms including polydipsia, polyuria and weight loss Allergic/Immunologic: negative for anaphylaxis, hay fever and urticaria   Past Medical History  Diagnosis Date  . Atrial fibrillation   . Coronary atherosclerosis of native coronary artery     Nonobstructive, LVEF 60%  . COPD (chronic obstructive pulmonary disease)   . Mixed hyperlipidemia   . Essential hypertension, benign   . Tachycardia-bradycardia syndrome   . Embolism - blood clot   . SOB (shortness of breath) on exertion   . Allergy     Latex  . Asthma   . Anxiety   . Myocardial infarction     12 years ago ?  [name] History of radiation therapy     59 gy fraction 5 eot 07/09/12 right lung  . Lung cancer 04/29/12 dx    RLL needle bx=invasive well diff adenocarcinoma  [facility]   Past Surgical History  Procedure Laterality Date  . Appendectomy    . Insert / replace / remove pacemaker  3/11     St. Jude - Dr. [name]  . Video bronchoscopy  02/22/2012    Procedure: VIDEO BRONCHOSCOPY WITH FLUORO;  Surgeon: Nyoka Cowden, MD;  Location: [facility];  Service: Cardiopulmonary;  Laterality: Bilateral;  . Lung biopsy  04/29/12    RLL invasive well diff adenocarcinoma  . Lung biopsy  02/22/12    RML=benign lung parenchyma,no tumor sen  . Tonsillectomy and adenoidectomy      age 58  . Total abdominal hysterectomy  1975    1/2 right ovary intact, left salpingo-oppherctomy   Social History:  reports that she has quit smoking. Her smoking use included Cigarettes. She has a 10.75 pack-year smoking history. She has quit using smokeless tobacco. She reports that she does not drink alcohol or use illicit drugs.   Allergies  Allergen Reactions  . Latex     "pulls skin off"  Family History  Problem Relation Age of Onset  . Hypertension Mother   . Emphysema Brother     smoker  . Asthma Father   . Asthma Brother   . Lung cancer Paternal Grandmother     was a smoker  . Cancer Maternal Grandmother     lung, dx early 68's, died 3  days after lobectomy    Prior to Admission medications   Medication Sig Start Date End Date Taking? Authorizing Provider  albuterol (PROAIR HFA) 108 (90 BASE) MCG/ACT inhaler Inhale 2 puffs into the lungs every 6 (six) hours as needed for wheezing or shortness of breath.   Yes Historical Provider, MD  albuterol (PROVENTIL) (2.5 MG/3ML) 0.083% nebulizer solution Take 2.5 mg by nebulization every 6 (six) hours as needed for wheezing or shortness of breath.    Yes Historical Provider, MD  aspirin EC 81 MG tablet Take 81 mg by mouth daily.   Yes Historical Provider, MD  flecainide (TAMBOCOR) 100 MG tablet Take 1 tablet (100 mg total) by mouth 2 (two) times daily. 08/13/12  Yes Jonelle Sidle, MD  hyoscyamine (LEVSIN/SL) 0.125 MG SL tablet Place 1 tablet (0.125 mg total) under the tongue every 4 (four) hours as needed for cramping. 11/11/12  Yes Len Blalock, NP  ipratropium (ATROVENT) 0.02 % nebulizer solution Take 500 mcg by nebulization 4 (four) times daily as needed for wheezing (shortness of breath).    Yes Historical Provider, MD  MELATONIN ER PO Take 5 mg by mouth at bedtime.   Yes Historical Provider, MD  nebivolol (BYSTOLIC) 10 MG tablet Take 10 mg by mouth daily.  05/19/12  Yes Nyoka Cowden, MD  oxyCODONE-acetaminophen (PERCOCET) 10-325 MG per tablet Take 1 tablet by mouth every 8 (eight) hours as needed. Takes once daily  And prn for knees   Yes Historical Provider, MD  potassium chloride SA (KLOR-CON M20) 20 MEQ tablet Take 1 tablet (20 mEq total) by mouth daily. 06/30/12  Yes Marinus Maw, MD  rosuvastatin (CRESTOR) 40 MG tablet Take 1 tablet (40 mg total) by mouth daily. 06/10/12  Yes Jodelle Gross, NP  TRIBENZOR 40-5-25 MG TABS take 1 tablet once daily 10/04/12  Yes Nyoka Cowden, MD  warfarin (COUMADIN) 5 MG tablet 5mg  daily except 2.5mg  on Tuesdays, Thursdays and Saturdays or as directed by anticoagulation clinic 10/09/12  Yes Jodelle Gross, NP   Physical Exam: Filed  Vitals:   11/16/12 2045 11/16/12 2100 11/16/12 2115 11/16/12 2304  BP:    131/52  Pulse: 66 65 66 71  Temp:    97.9 F (36.6 C)  TempSrc:    Oral  Resp:    18  Height:    5\' 2"  (1.575 m)  Weight:    76.4 kg (168 lb 6.9 oz)  SpO2: 96% 95% 95% 95%   General appearance: alert, cooperative and no distress  Head: Normocephalic, without obvious abnormality, atraumatic  Eyes: conjunctivae/corneas clear. PERRL, EOM's intact. Did not look for papilledema, ophthalmoscope was not available in the room. Nose: Nares normal. Septum midline. Mucosa normal. No drainage or sinus tenderness.  Throat: lips, mucosa, and tongue normal; teeth and gums normal  Neck: Supple, no masses, no cervical lymphadenopathy, no JVD appreciated, no meningeal signs Resp: clear to auscultation bilaterally  Chest wall: no tenderness  Cardio: regular rate and rhythm, S1, S2 normal, no murmur, click, rub or gallop  GI: soft, non-tender; bowel sounds normal; no masses, no organomegaly  Extremities: extremities normal,  atraumatic, no cyanosis or edema  Skin: Skin color, texture, turgor normal. No rashes or lesions  Neurologic: Alert and oriented X 3, normal strength and tone. Normal symmetric reflexes. She has dysmetria on her right side, gait was not tested.  Labs on Admission:  Basic Metabolic Panel:  Recent Labs Lab 11/12/12 1433 11/16/12 1603  NA 141 137  K 3.3* 3.8  CL 103 100  CO2 26 27  GLUCOSE 82 116*  BUN 15.3 16  CREATININE 0.8 0.71  CALCIUM 9.4 9.1  MG 1.9  --    Liver Function Tests:  Recent Labs Lab 11/12/12 1433 11/16/12 1603  AST 21 19  ALT 18 18  ALKPHOS 63 47  BILITOT 0.42 0.4  PROT 6.9 6.5  ALBUMIN 3.7 3.5   No results found for this basename: LIPASE, AMYLASE,  in the last 168 hours No results found for this basename: AMMONIA,  in the last 168 hours CBC:  Recent Labs Lab 11/12/12 1433 11/16/12 1603  WBC 9.4 12.4*  NEUTROABS 7.2* 11.2*  HGB 14.3 13.4  HCT 41.2 38.8  MCV 93.2  94.2  PLT 159 166   Cardiac Enzymes: No results found for this basename: CKTOTAL, CKMB, CKMBINDEX, TROPONINI,  in the last 168 hours  BNP (last 3 results) No results found for this basename: PROBNP,  in the last 8760 hours CBG: No results found for this basename: GLUCAP,  in the last 168 hours  Radiological Exams on Admission: Ct Head Wo Contrast  11/16/2012  *RADIOLOGY REPORT*  Clinical Data: Vomiting, dizziness  CT HEAD WITHOUT CONTRAST  Technique:  Contiguous axial images were obtained from the base of the skull through the vertex without contrast.  Comparison: CT brain scan of 05/21/2012  Findings: There is a large rounded lesion within the right cerebellum displacing the midline to the left and slightly impinging upon the fourth ventricle.  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  There is a question of a second lesion high in the right cerebellum or possibly in the right posterior parietal lobe measuring 2.5 x 2.0 cm.  No acute hemorrhage is seen.  The ventricular system is stable in size and configuration and the septum is in a midline position.  MRI is recommended for more sensitive assessment of possible additional metastatic lesions.  Mild small vessel ischemic change is present in the periventricular white matter.  No calvarial abnormality is seen.  IMPRESSION:  1.  Rounded peripherally enhancing lesion in the right cerebellar hemisphere with questionable second lesion possibly the right posterior parietal lobe suspicious for metastatic lesions.  MRI of the brain is recommended. 2.  Mild small vessel ischemic change.  I discussed the findings of this study with Dr. Estell Harpin on 11/16/2012 at 4:50 p.m.   Original Report Authenticated By: Dwyane Dee, M.D.     EKG: Independently reviewed.   Assessment/Plan Principal Problem:   Metastatic adenocarcinoma to brain Active Problems:   Atrial fibrillation   COPD  (chronic obstructive pulmonary disease)   Cardiac pacemaker in situ   Essential hypertension, benign   Ataxia   Brain metastases -CT scan of the head done in August was negative, CT now showed confirmed similar spot then questionable adrenal lobe lesion. -Patient started on Decadron with loading dose of 10 mg, continue with 4 mg every 6 hours. -MRI of the brain with contrast to be done. -Dr. Truett Perna of the regional Center notified by the ED physician, oncology  to evaluate in the morning. -She will need to radiation oncology consultation she follows with Dr. Mitzi Hansen.  Non-small cell lung cancer, metastatic -Patient follows Dr. Arbutus Ped, she was supposed to decide if she wants to start chemotherapy or not. -She is treated by radiosurgery.  Atrial fibrillation -Patient heart rate is controlled with Bystolic and Tambocor. -I will hold on restarting Coumadin till oncology advise about the safety of anticoagulation. -Brain metastases has tendency to bleed, oncology please advise. Patient supposed to be on Coumadin.  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 -Patient advised about not to drive, because of both dysmetria and her high risk for seizure.  Code Status: Full code Family Communication: Discussed with the patient was awake and alert. Disposition Plan: Inpatient  Time spent: 70 minutes  [facility] Pager [phone]  If 7PM-7AM, please contact night-coverage [email or web address] Password [facility] 11/16/2012, 11:52 PM

## 2012-11-17 ENCOUNTER — Encounter: Payer: Self-pay | Admitting: *Deleted

## 2012-11-17 ENCOUNTER — Encounter (HOSPITAL_COMMUNITY): Payer: Self-pay | Admitting: *Deleted

## 2012-11-17 ENCOUNTER — Inpatient Hospital Stay (HOSPITAL_COMMUNITY): Payer: Medicare Other

## 2012-11-17 ENCOUNTER — Encounter: Payer: Medicare Other | Admitting: *Deleted

## 2012-11-17 DIAGNOSIS — C342 Malignant neoplasm of middle lobe, bronchus or lung: Secondary | ICD-10-CM

## 2012-11-17 DIAGNOSIS — T7840XA Allergy, unspecified, initial encounter: Secondary | ICD-10-CM

## 2012-11-17 DIAGNOSIS — C3430 Malignant neoplasm of lower lobe, unspecified bronchus or lung: Secondary | ICD-10-CM

## 2012-11-17 HISTORY — PX: MR BRAIN W WO CONTRAST: IMG271

## 2012-11-17 LAB — GLUCOSE, CAPILLARY
Glucose-Capillary: 160 mg/dL — ABNORMAL HIGH (ref 70–99)
Glucose-Capillary: 146 mg/dL — ABNORMAL HIGH (ref 70–99)

## 2012-11-17 MED ORDER — HYDROCHLOROTHIAZIDE 25 MG PO TABS
25.0000 mg | ORAL_TABLET | Freq: Every day | ORAL | Status: DC
  Administered 2012-11-17 – 2012-11-21 (×5): 25 mg via ORAL
  Filled 2012-11-17 (×6): qty 1

## 2012-11-17 MED ORDER — AMLODIPINE BESYLATE 5 MG PO TABS
5.0000 mg | ORAL_TABLET | Freq: Every day | ORAL | Status: DC
  Administered 2012-11-17 – 2012-11-21 (×5): 5 mg via ORAL
  Filled 2012-11-17 (×6): qty 1

## 2012-11-17 MED ORDER — IRBESARTAN 300 MG PO TABS
300.0000 mg | ORAL_TABLET | Freq: Every day | ORAL | Status: DC
  Administered 2012-11-17 – 2012-11-21 (×5): 300 mg via ORAL
  Filled 2012-11-17 (×6): qty 1

## 2012-11-17 NOTE — Progress Notes (Signed)
11/17/12 @ 08:45, CTUS Z6109:  Discovered that Mrs. Bieda was admitted to the hospital 11/16/12 with difficulty walking.  She was diagnoses with brain metastatis.  She will not be able to participate in the E5508 clinical trial.  Dr. Arbutus Ped is aware.  U0454 treatment plan removed.  Alerted chemo education nurse and Dr. Asa Lente nurse of same.

## 2012-11-17 NOTE — Progress Notes (Signed)
INITIAL NUTRITION ASSESSMENT  DOCUMENTATION CODES Per approved criteria  -Obesity Unspecified   INTERVENTION: - Encouraged increased intake of bland foods. Pt declines needing any nutritional supplements at this time but knows to ask MD for Ensure when she wants to try one.  - Will continue to monitor  NUTRITION DIAGNOSIS: Inadequate oral intake related to poor appetite as evidenced by 0% meal intake.   Goal: 1. Pt to consume >90% of meals 2. No further nausea/vomiting  Monitor:  Weights, labs, intake, nausea/vomiting  Reason for Assessment: Nutrition risk   62 y.o. female  Admitting Dx: Metastatic adenocarcinoma to brain  ASSESSMENT: Pt with metastatic non-small cell lung CA s/p radiation. Pt reports nausea for the past week with some decline in intake however reports she typically "eats like a horse". Pt not on any nutritional supplements at home. Pt reports 20 pound unintended weight loss in the past 6 months which pt states was from the cancer, not from malnutrition. Pt reports her nausea has gone away and last emesis reported was last night. Pt reports she has not been hungry today.   Height: Ht Readings from Last 1 Encounters:  11/16/12 5\' 2"  (1.575 m)    Weight: Wt Readings from Last 1 Encounters:  11/16/12 168 lb 6.9 oz (76.4 kg)    Ideal Body Weight: 110 lb  % Ideal Body Weight: 153  Wt Readings from Last 10 Encounters:  11/16/12 168 lb 6.9 oz (76.4 kg)  11/11/12 173 lb (78.472 kg)  11/07/12 176 lb (79.833 kg)  10/29/12 176 lb 3.2 oz (79.924 kg)  09/24/12 181 lb 6.4 oz (82.283 kg)  08/28/12 180 lb (81.647 kg)  07/30/12 182 lb 6.4 oz (82.736 kg)  05/28/12 187 lb (84.823 kg)  05/14/12 187 lb 12.8 oz (85.186 kg)  04/29/12 195 lb (88.451 kg)    Usual Body Weight: 188 lb per pt report  % Usual Body Weight: 89  BMI:  Body mass index is 30.8 kg/(m^2). Class I obesity  Estimated Nutritional Needs: Kcal: 1500-1650 Protein: 60-70g Fluid:  1.5-1.6L/day   Diet Order: Cardiac  EDUCATION NEEDS: -No education needs identified at this time   Intake/Output Summary (Last 24 hours) at 11/17/12 1519 Last data filed at 11/17/12 0843  Gross per 24 hour  Intake    240 ml  Output      0 ml  Net    240 ml    Last BM: 11/15/12  Labs:   Recent Labs Lab 11/12/12 1433 11/16/12 1603  NA 141 137  K 3.3* 3.8  CL 103 100  CO2 26 27  BUN 15.3 16  CREATININE 0.8 0.71  CALCIUM 9.4 9.1  MG 1.9  --   GLUCOSE 82 116*    CBG (last 3)   Recent Labs  11/17/12 0745 11/17/12 1153  GLUCAP 138* 160*    Scheduled Meds: . amLODipine  5 mg Oral Daily  . aspirin EC  81 mg Oral Daily  . atorvastatin  40 mg Oral q1800  . dexamethasone  4 mg Intravenous Q6H  . flecainide  100 mg Oral BID  . heparin  5,000 Units Subcutaneous Q8H  . hydrochlorothiazide  25 mg Oral Daily  . insulin aspart  0-9 Units Subcutaneous TID WC  . irbesartan  300 mg Oral Daily  . nebivolol  10 mg Oral Daily  . polyethylene glycol  17 g Oral Daily    Continuous Infusions: . sodium chloride 75 mL/hr at 11/17/12 0008    Past Medical History  Diagnosis Date  . Atrial fibrillation   . Coronary atherosclerosis of native coronary artery     Nonobstructive, LVEF 60%  . COPD (chronic obstructive pulmonary disease)   . Mixed hyperlipidemia   . Essential hypertension, benign   . Tachycardia-bradycardia syndrome   . Embolism - blood clot   . SOB (shortness of breath) on exertion   . Allergy     Latex  . Asthma   . Anxiety   . Myocardial infarction     12 years ago ?  [name] History of radiation therapy     59 gy fraction 5 eot 07/09/12 right lung  . Lung cancer 04/29/12 dx    RLL needle bx=invasive well diff adenocarcinoma  [facility]    Past Surgical History  Procedure Laterality Date  . Appendectomy    . Insert / replace / remove pacemaker  3/11     [employer] - Dr. [name]  . Video bronchoscopy  02/22/2012    Procedure: VIDEO BRONCHOSCOPY WITH FLUORO;   Surgeon: Nyoka Cowden, MD;  Location: [facility];  Service: Cardiopulmonary;  Laterality: Bilateral;  . Lung biopsy  04/29/12    RLL invasive well diff adenocarcinoma  . Lung biopsy  02/22/12    RML=benign lung parenchyma,no tumor sen  . Tonsillectomy and adenoidectomy      age 58  . Total abdominal hysterectomy  1975    1/2 right ovary intact, left salpingo-oppherctomy  . Mr brain w wo contrast  11/17/2012         Levon Hedger MS, RD, LDN [phone] Pager [phone] After Hours Pager

## 2012-11-17 NOTE — Progress Notes (Signed)
DIAGNOSIS: Metastatic non-small cell lung cancer initially diagnosed as synchronous primary non-small cell lung cancer, adenocarcinoma with negative EGFR mutation and negative ALK gene translocation involving the right middle lobe and lower lobe diagnosed in July of 2013.   PRIOR THERAPY: Stereotactic radiotherapy to the right lung lesions under the care of Dr. Mitzi Hansen completed on 07/09/2012.   CURRENT THERAPY: None   Subjective: The patient is seen and examined today. She was admitted yesterday with mental status change and dizzy spells. CT of the head performed yesterday showed new metastatic brain lesions. The patient was started on steroids and feeling a little bit better. She denied having any significant fever or chills today. She denied having any nausea or vomiting. She has no chest pain or shortness of breath. She was supposed to start chemotherapy according to clinical trial protocol 5508 on 11/20/2011 but unfortunately the patient will be iligable for the clinical trial after her new brain metastasis.  Objective: Vital signs in last 24 hours: Temp:  [97.9 F (36.6 C)-98.3 F (36.8 C)] 98 F (36.7 C) (02/10 0530) Pulse Rate:  [63-73] 67 (02/10 0530) Resp:  [18-20] 18 (02/10 0530) BP: (101-140)/(44-63) 108/53 mmHg (02/10 0530) SpO2:  [87 %-97 %] 94 % (02/10 0530) Weight:  [168 lb 6.9 oz (76.4 kg)-172 lb (78.019 kg)] 168 lb 6.9 oz (76.4 kg) (02/09 2304)  Intake/Output from previous day:   Intake/Output this shift: Total I/O In: 240 [P.O.:240] Out: -   General appearance: alert, cooperative and no distress Resp: clear to auscultation bilaterally Cardio: regular rate and rhythm, S1, S2 normal, no murmur, click, rub or gallop GI: soft, non-tender; bowel sounds normal; no masses,  no organomegaly Extremities: extremities normal, atraumatic, no cyanosis or edema  Lab Results:   Recent Labs  11/16/12 1603  WBC 12.4*  HGB 13.4  HCT 38.8  PLT 166   BMET  Recent Labs  11/16/12 1603  NA 137  K 3.8  CL 100  CO2 27  GLUCOSE 116*  BUN 16  CREATININE 0.71  CALCIUM 9.1    Studies/Results: Ct Head Wo Contrast  11/16/2012  *RADIOLOGY REPORT*  Clinical Data: Vomiting, dizziness  CT HEAD WITHOUT CONTRAST  Technique:  Contiguous axial images were obtained from the base of the skull through the vertex without contrast.  Comparison: CT brain scan of 05/21/2012  Findings: There is a large rounded lesion within the right cerebellum displacing the midline to the left and slightly impinging upon the fourth ventricle.  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  There is a question of a second lesion high in the right cerebellum or possibly in the right posterior parietal lobe measuring 2.5 x 2.0 cm.  No acute hemorrhage is seen.  The ventricular system is stable in size and configuration and the septum is in a midline position.  MRI is recommended for more sensitive assessment of possible additional metastatic lesions.  Mild small vessel ischemic change is present in the periventricular white matter.  No calvarial abnormality is seen.  IMPRESSION:  1.  Rounded peripherally enhancing lesion in the right cerebellar hemisphere with questionable second lesion possibly the right posterior parietal lobe suspicious for metastatic lesions.  MRI of the brain is recommended. 2.  Mild small vessel ischemic change.  I discussed the findings of this study with Dr. Estell Harpin on 11/16/2012 at 4:50 p.m.   Original Report Authenticated By: Dwyane Dee, M.D.     Medications: I  have reviewed the patient's current medications.  Assessment/Plan: This is a very pleasant 62 years old white female with metastatic non-small cell lung cancer now presenting with new metastatic brain lesions.  1) continue treatment with Decadron and taper the dose gradually after initiation of radiation. 2) she will need MRI of the brain to r/o any  other metastatic brain lesions. 3) I will consult Dr. Mitzi Hansen to evaluate the patient for brain radiation. 4) I would hold her chemotherapy for now as the patient would not be a good candidate for the clinical trial and I would consider her for standard systemic chemotherapy after completion of her brain radiation. Thank you so much for taking good care of [name]. I will continue to follow up the patient with you and assist in her management.  LOS: 1 day    Aily Tzeng K. 11/17/2012

## 2012-11-17 NOTE — Progress Notes (Signed)
Pt. Was scheduled for an MRI today. Notified by MRI that the patient's pacemaker was not compatible for MRI. MD notified.

## 2012-11-17 NOTE — Progress Notes (Signed)
Patient ID: DANAIYA STEADMAN, female   DOB: 1950/12/02, 62 y.o.   MRN: [id number]  [facility] PROGRESS NOTE  RAYHANA SLIDER [id number]:[id number] DOB: 1951-09-02 DOA: 11/16/2012 PCP: Ernestine Conrad, MD  Brief narrative: Pt is 62 y.o. female with past medical history of atrial fibrillation, COPD, and history of tobacco use. Patient was diagnosed with non-small cell lung cancer on 04/29/2012 and she follows with Dr. Arbutus Ped. Patient presented to [facility] with ataxia that has been associated with 2 week history of generalized headaches and non bloody vomiting that did not resolve with usual OTC medications. Initial evaluation in the emergency department with CT scan of the head showed cerebellar lesion consistent with metastasis and another questionable right parietal lobe lesion. Patient sent to [facility] for further evaluation and [facility] team asked to admit.   Active problems: Brain metastases  -CT scan of the head done in August was negative, CT during this admission with worrisome brain metastases -Patient started on Decadron with loading dose of 10 mg, continue with 4 mg every 6 hours.  -MRI of the brain with contrast still needs to be done for clearer evaluation  -Contact Dr. Mitzi Hansen for further recommendations on radiation therapy  Non-small cell lung cancer, metastatic  -Appreciate Dr. Arbutus Ped input Atrial fibrillation  -Patient heart rate is controlled with Bystolic and Tambocor.  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  -Patient advised about not to drive, because of both dysmetria and her high risk for seizure.   Consultants:  Oncology  Radiation oncology   Procedures/Studies: Ct Head Wo Contrast 11/16/2012  1.  Rounded peripherally enhancing lesion in the right cerebellar hemisphere with questionable second lesion possibly the right posterior parietal lobe suspicious for metastatic lesions.   2.  Mild small vessel  ischemic change.  Antibiotics:  None  Code Status: Full Family Communication: Pt at bedside Disposition Plan: Home when medically stable  HPI/Subjective: No events overnight.   Objective: Filed Vitals:   11/16/12 2100 11/16/12 2115 11/16/12 2304 11/17/12 0530  BP:   131/52 108/53  Pulse: 65 66 71 67  Temp:   97.9 F (36.6 C) 98 F (36.7 C)  TempSrc:   Oral Oral  Resp:   18 18  Height:   5\' 2"  (1.575 m)   Weight:   76.4 kg (168 lb 6.9 oz)   SpO2: 95% 95% 95% 94%    Intake/Output Summary (Last 24 hours) at 11/17/12 1255 Last data filed at 11/17/12 0843  Gross per 24 hour  Intake    240 ml  Output      0 ml  Net    240 ml    Exam:   General:  Pt is alert, follows commands appropriately, not in acute distress  Cardiovascular: Regular rate and rhythm, S1/S2, no murmurs, no rubs, no gallops  Respiratory: Clear to auscultation bilaterally, no wheezing, no crackles, no rhonchi  Abdomen: Soft, non tender, non distended, bowel sounds present, no guarding  Extremities: No edema, pulses DP and PT palpable bilaterally  Neuro: Grossly nonfocal  Data Reviewed: Basic Metabolic Panel:  Recent Labs Lab 11/12/12 1433 11/16/12 1603  NA 141 137  K 3.3* 3.8  CL 103 100  CO2 26 27  GLUCOSE 82 116*  BUN 15.3 16  CREATININE 0.8 0.71  CALCIUM 9.4 9.1  MG 1.9  --    Liver Function Tests:  Recent Labs Lab 11/12/12 1433 11/16/12 1603  AST 21 19  ALT 18 18  ALKPHOS 63 47  BILITOT 0.42 0.4  PROT 6.9 6.5  ALBUMIN 3.7 3.5   CBC:  Recent Labs Lab 11/12/12 1433 11/16/12 1603  WBC 9.4 12.4*  NEUTROABS 7.2* 11.2*  HGB 14.3 13.4  HCT 41.2 38.8  MCV 93.2 94.2  PLT 159 166   CBG:  Recent Labs Lab 11/17/12 0745 11/17/12 1153  GLUCAP 138* 160*    Scheduled Meds: . amLODipine  5 mg Oral Daily  . aspirin EC  81 mg Oral Daily  . atorvastatin  40 mg Oral q1800  . dexamethasone  4 mg Intravenous Q6H  . flecainide  100 mg Oral BID  . heparin  5,000 Units  Subcutaneous Q8H  . hydrochlorothiazide  25 mg Oral Daily  . insulin aspart  0-9 Units Subcutaneous TID WC  . irbesartan  300 mg Oral Daily  . nebivolol  10 mg Oral Daily  . polyethylene glycol  17 g Oral Daily   Continuous Infusions: . sodium chloride 75 mL/hr at 11/17/12 0008     Debbora Presto, MD  [facility] Pager [phone]  If 7PM-7AM, please contact night-coverage [email or web address] Password [email or web address] 11/17/2012, 12:55 PM   LOS: 1 day

## 2012-11-17 NOTE — Procedures (Signed)
Pt has a pacemaker

## 2012-11-18 ENCOUNTER — Telehealth: Payer: Self-pay

## 2012-11-18 ENCOUNTER — Telehealth: Payer: Self-pay | Admitting: Medical Oncology

## 2012-11-18 ENCOUNTER — Other Ambulatory Visit: Payer: Medicare Other

## 2012-11-18 LAB — GLUCOSE, CAPILLARY

## 2012-11-18 LAB — BASIC METABOLIC PANEL
Calcium: 8.5 mg/dL (ref 8.4–10.5)
GFR calc non Af Amer: 89 mL/min — ABNORMAL LOW (ref >90)
Potassium: 3.8 mEq/L (ref 3.5–5.1)
Sodium: 138 mEq/L (ref 135–145)

## 2012-11-18 LAB — CBC
MCH: 31.5 pg (ref 26.0–34.0)
MCHC: 33.2 g/dL (ref 30.0–36.0)
Platelets: 171 10*3/uL (ref 150–400)
RBC: 3.91 MIL/uL (ref 3.87–5.11)

## 2012-11-18 MED ORDER — ZOLPIDEM TARTRATE 5 MG PO TABS
5.0000 mg | ORAL_TABLET | Freq: Once | ORAL | Status: AC
  Administered 2012-11-18: 5 mg via ORAL
  Filled 2012-11-18: qty 1

## 2012-11-18 NOTE — Progress Notes (Signed)
Patient ID: [name], female   DOB: 06-17-1951, 62 y.o.   MRN: [id number]  [facility] PROGRESS NOTE  [name] [id number]:[id number] DOB: [birth year] DOA: 12/16/12 PCP: Ernestine Conrad, MD  Brief narrative:  Pt is 62 y.o. female with past medical history of atrial fibrillation, COPD, and history of tobacco use. Patient was diagnosed with non-small cell lung cancer on 04/29/2012 and she follows with Dr. Arbutus Ped. Patient presented to [facility] with ataxia that has been associated with 2 week history of generalized headaches and non bloody vomiting that did not resolve with usual OTC medications.   Initial evaluation in the emergency department with CT scan of the head showed cerebellar lesion consistent with metastasis and another questionable right parietal lobe lesion. Patient sent to [facility] for further evaluation and [facility] team asked to admit.   Active problems:  Brain metastases  -CT scan of the head done in August was negative, CT during this admission with worrisome brain metastases  -Patient started on Decadron with loading dose of 10 mg, continuing with 4 mg every 6 hours.  -MRI of the brain with contrast still needs to be done for clearer evaluation  -Appreciate input from radiation oncologist and oncologist Non-small cell lung cancer, metastatic  -Appreciate Dr. Arbutus Ped input  -Pt was supposed to start chemotherapy according to clinical trial protocol on 11/20/2011 but unfortunately not eligible  for trial after new brain met's Atrial fibrillation  -Patient heart rate is controlled with Bystolic and Tambocor.  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  -Patient advised about not to drive, because of both dysmetria and her high risk for seizure.  -Continue Decadron as noted above  Consultants:  Oncology  Radiation oncology   Procedures/Studies:   Ct Head Wo Contrast   12/16/12  1. Rounded peripherally enhancing lesion in the right cerebellar hemisphere with questionable second lesion possibly the right posterior parietal lobe suspicious for metastatic lesions.  2. Mild small vessel ischemic change.   Antibiotics:  None  Code Status: Full  Family Communication: Pt at bedside  Disposition Plan: Home when medically ready and cleared by oncology  HPI/Subjective: No events overnight.   Objective: Filed Vitals:   11/17/12 1414 11/17/12 2128 11/17/12 2243 11/18/12 0653  BP: 114/58 112/56  114/48  Pulse: 66 55  62  Temp: 98 F (36.7 C) 97.7 F (36.5 C)  97.4 F (36.3 C)  TempSrc: Oral Oral  Oral  Resp: 18 20  20   Height:      Weight:      SpO2: 94% 95% 95% 96%    Intake/Output Summary (Last 24 hours) at 11/18/12 1240 Last data filed at 11/18/12 0946  Gross per 24 hour  Intake   2298 ml  Output    100 ml  Net   2198 ml    Exam:   General:  Pt is alert, follows commands appropriately, not in acute distress  Cardiovascular: Regular rate and rhythm, S1/S2, no murmurs, no rubs, no gallops  Respiratory: Clear to auscultation bilaterally, no wheezing, no crackles, no rhonchi  Abdomen: Soft, non tender, non distended, bowel sounds present, no guarding  Extremities: No edema, pulses DP and PT palpable bilaterally  Neuro: Grossly nonfocal  Data Reviewed: Basic Metabolic Panel:  Recent Labs Lab 11/12/12 1433 December 16, 2012 1603 11/18/12 0330  NA 141 137 138  K 3.3* 3.8 3.8  CL 103 100 104  CO2 26  27 27  GLUCOSE 82 116* 169*  BUN 15.3 16 21   CREATININE 0.8 0.71 0.74  CALCIUM 9.4 9.1 8.5  MG 1.9  --   --    Liver Function Tests:  Recent Labs Lab 11/12/12 1433 11/16/12 1603  AST 21 19  ALT 18 18  ALKPHOS 63 47  BILITOT 0.42 0.4  PROT 6.9 6.5  ALBUMIN 3.7 3.5   CBC:  Recent Labs Lab 11/12/12 1433 11/16/12 1603 11/18/12 0330  WBC 9.4 12.4* 20.7*  NEUTROABS 7.2* 11.2*  --   HGB 14.3 13.4 12.3  HCT 41.2 38.8 37.0  MCV  93.2 94.2 94.6  PLT 159 166 171   CBG:  Recent Labs Lab 11/17/12 1153 11/17/12 1716 11/17/12 2125 11/18/12 0735 11/18/12 1154  GLUCAP 160* 146* 180* 153* 218*    Scheduled Meds: . amLODipine  5 mg Oral Daily  . aspirin EC  81 mg Oral Daily  . atorvastatin  40 mg Oral q1800  . dexamethasone  4 mg Intravenous Q6H  . flecainide  100 mg Oral BID  . heparin  5,000 Units Subcutaneous Q8H  . hydrochlorothiazide  25 mg Oral Daily  . insulin aspart  0-9 Units Subcutaneous TID WC  . irbesartan  300 mg Oral Daily  . nebivolol  10 mg Oral Daily  . polyethylene glycol  17 g Oral Daily   Continuous Infusions: . sodium chloride 75 mL/hr at 11/18/12 0039     Kari Presto, MD  [facility] Pager [phone]  If 7PM-7AM, please contact night-coverage [email or web address] Password [facility] 11/18/2012, 12:40 PM   LOS: 2 days

## 2012-11-18 NOTE — Telephone Encounter (Signed)
Dondra Spry RN called -pt in hospital with new brain mets. Cancel appt tomorrow and she will not be going on research study.

## 2012-11-18 NOTE — Telephone Encounter (Signed)
Informed Deanna RN palliative care unit that after speaking with Dr.Moody regarding not being able to perform MRI of brain as patient has a pacemaker, we will go forth with ct simulation on tomorrow (11/19/12).

## 2012-11-18 NOTE — Progress Notes (Signed)
Radiation Oncology         (336) [phone] ________________________________  Name: [name] MRN: [id number]  Date: 11/17/12  DOB: [birth year]   Diagnosis:   Metastatic non-small cell lung cancer  Interval Since Last Radiation:  4 months   Narrative:  The patient is known to our office and completed stereotactic body radiotherapy to 2 target lesions within the right lung. The diagnosis at that time was synchronous non-small cell lung cancer primaries. However, the patient presented recently to [facility] with some generalized headaches as well as nausea and vomiting. She also was experiencing some ataxia. The patient underwent a CT scan of the head. This demonstrated 2 apparent/likely lesions corresponding to intracranial metastasis. There is a 4.0 cm right cerebellar metastasis in addition to a likely 2.5 cm more superior lesion within the right cerebellum. This did cause some displacement of the midline to the left. The patient has been seen by Dr. Arbutus Ped and he has recommended proceeding with an MRI scan of the brain. The patient has been started on steroids and she notes improvement in her symptoms. The patient's chemotherapy is currently on hold with an anticipated upcoming course of cranial radiation.     ALLERGIES:  is allergic to latex.  Meds: Current Facility-Administered Medications  Medication Dose Route Frequency Provider Last Rate Last Dose  . 0.9 %  sodium chloride infusion   Intravenous Continuous Clydia Llano, MD 75 mL/hr at 11/18/12 0039    . acetaminophen (TYLENOL) tablet 650 mg  650 mg Oral Q6H PRN Clydia Llano, MD       Or  . acetaminophen (TYLENOL) suppository 650 mg  650 mg Rectal Q6H PRN Clydia Llano, MD      . albuterol (PROVENTIL HFA;VENTOLIN HFA) 108 (90 BASE) MCG/ACT inhaler 2 puff  2 puff Inhalation Q6H PRN Clydia Llano, MD   2 puff at 11/17/12 2243  . amLODipine (NORVASC) tablet 5 mg  5 mg Oral Daily Clydia Llano, MD   5 mg at 11/18/12 0943  . aspirin EC tablet  81 mg  81 mg Oral Daily Clydia Llano, MD   81 mg at 11/18/12 0943  . atorvastatin (LIPITOR) tablet 40 mg  40 mg Oral q1800 Clydia Llano, MD   40 mg at 11/17/12 1733  . dexamethasone (DECADRON) injection 4 mg  4 mg Intravenous Q6H Clydia Llano, MD   4 mg at 11/18/12 0523  . flecainide (TAMBOCOR) tablet 100 mg  100 mg Oral BID Clydia Llano, MD   100 mg at 11/18/12 0943  . heparin injection 5,000 Units  5,000 Units Subcutaneous Q8H Clydia Llano, MD   5,000 Units at 11/18/12 0522  . hydrochlorothiazide (HYDRODIURIL) tablet 25 mg  25 mg Oral Daily Clydia Llano, MD   25 mg at 11/18/12 0943  . HYDROcodone-acetaminophen (NORCO/VICODIN) 5-325 MG per tablet 1-2 tablet  1-2 tablet Oral Q4H PRN Clydia Llano, MD      . insulin aspart (novoLOG) injection 0-9 Units  0-9 Units Subcutaneous TID WC Clydia Llano, MD   2 Units at 11/18/12 2725  . ipratropium (ATROVENT) nebulizer solution 500 mcg  500 mcg Nebulization QID PRN Clydia Llano, MD      . irbesartan (AVAPRO) tablet 300 mg  300 mg Oral Daily Clydia Llano, MD   300 mg at 11/18/12 0943  . morphine 2 MG/ML injection 1 mg  1 mg Intravenous Q4H PRN Clydia Llano, MD      . nebivolol (BYSTOLIC) tablet 10 mg  10 mg Oral Daily  Clydia Llano, MD   10 mg at 11/18/12 0943  . ondansetron (ZOFRAN) tablet 4 mg  4 mg Oral Q6H PRN Clydia Llano, MD       Or  . ondansetron (ZOFRAN) injection 4 mg  4 mg Intravenous Q6H PRN Clydia Llano, MD      . polyethylene glycol (MIRALAX / GLYCOLAX) packet 17 g  17 g Oral Daily Clydia Llano, MD   17 g at 11/18/12 4782    Physical Findings: The patient is in no acute distress. Patient is alert and oriented.  height is 5\' 2"  (1.575 m) and weight is 168 lb 6.9 oz (76.4 kg). Her oral temperature is 97.4 F (36.3 C). Her blood pressure is 114/48 and her pulse is 62. Her respiration is 20 and oxygen saturation is 96%. .     Lab Findings: Lab Results  Component Value Date   WBC 20.7* 11/18/2012   HGB 12.3 11/18/2012   HCT 37.0 11/18/2012    MCV 94.6 11/18/2012   PLT 171 11/18/2012     Radiographic Findings: Ct Head Wo Contrast  11/16/2012  *RADIOLOGY REPORT*  Clinical Data: Vomiting, dizziness  CT HEAD WITHOUT CONTRAST  Technique:  Contiguous axial images were obtained from the base of the skull through the vertex without contrast.  Comparison: CT brain scan of 05/21/2012  Findings: There is a large rounded lesion within the right cerebellum displacing the midline to the left and slightly impinging upon the fourth ventricle.  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  There is a question of a second lesion high in the right cerebellum or possibly in the right posterior parietal lobe measuring 2.5 x 2.0 cm.  No acute hemorrhage is seen.  The ventricular system is stable in size and configuration and the septum is in a midline position.  MRI is recommended for more sensitive assessment of possible additional metastatic lesions.  Mild small vessel ischemic change is present in the periventricular white matter.  No calvarial abnormality is seen.  IMPRESSION:  1.  Rounded peripherally enhancing lesion in the right cerebellar hemisphere with questionable second lesion possibly the right posterior parietal lobe suspicious for metastatic lesions.  MRI of the brain is recommended. 2.  Mild small vessel ischemic change.  I discussed the findings of this study with Dr. Estell Harpin on 11/16/2012 at 4:50 p.m.   Original Report Authenticated By: Dwyane Dee, M.D.    Ct Chest W Contrast  10/22/2012  *RADIOLOGY REPORT*  Clinical Data: Cough and shortness of breath.  Lung cancer diagnosed July 2013.  Pre chemotherapy.  CT CHEST WITH CONTRAST  Technique:  Multidetector CT imaging of the chest was performed following the standard protocol during bolus administration of intravenous contrast.  Contrast: 80mL OMNIPAQUE IOHEXOL 300 MG/ML  SOLN  Comparison: PET CT 05/21/2012, chest CT 02/11/2012   Findings: Irregular right middle lobe pulmonary nodule now measures 2.2 x 2.0 cm image 37 (previously 3.5 x 3.1 cm), with peripheral curvilinear pleural parenchymal nodularity, image 37.  Irregular right lower lobe pulmonary nodule now measures 1.1 x 0.8 cm image 43 (previously 1.2 x 1.3 cm).  Curvilinear left lower lobe scarring image 41 is stable.  Calcified granuloma superior segment right lower lobe image 25 is stable.  Moderate emphysematous changes are again noted.  No new pulmonary mass, nodule, or consolidation is identified.  Thoracic spine degenerative change is re-identified as well as endplate sclerosis at T9-T10 most compatible with  degenerative change.  Great vessels are normal in caliber.  Moderate atherosclerotic aortic calcification without aneurysm.  Left-sided pacer in place. Heart size is normal.  Small pretracheal nodes are stable.  No new hilar lymphadenopathy.  Incomplete imaging of the upper abdomen demonstrates a new irregular centrally hypodense 3.3 x 2.3 cm left adrenal mass image 55.  Too small to characterize splenic and hepatic hypodense lesions are again noted, subjectively unchanged.  IMPRESSION: New left adrenal mass, likely centrally necrotic, highly suspicious for interval development of metastatic disease.  Decrease in size of right middle lobe and right lower lobe masses compatible with the provided history of lung cancer.  These results will be called to the ordering clinician or representative by the Radiologist Assistant, and communication documented in the PACS Dashboard.   Original Report Authenticated By: Christiana Pellant, M.D.     Impression:    63 year old female with a history of non-small cell lung cancer, now with apparent metastatic disease with intracranial metastasis. At this point I suspect that the patient will likely be an appropriate candidate for whole brain radiotherapy based on the size and location of the lesions. The brain MRI scan may very well show  additional areas of disease intracranially. This scan will clarify the most appropriate radiotherapy technique for her case.  Plan:   -Recommend continuation of steroids -Brain MRI scan pending -I will schedule a simulation in the radiation department such that we can proceed with treatment planning.   Radene Gunning, M.D., Ph.D.

## 2012-11-19 ENCOUNTER — Ambulatory Visit
Admit: 2012-11-19 | Discharge: 2012-11-19 | Disposition: A | Discharge status: Home / Self Care | Payer: Medicare Other | Attending: Radiation Oncology | Admitting: Radiation Oncology

## 2012-11-19 ENCOUNTER — Ambulatory Visit: Payer: Medicare Other | Admitting: Internal Medicine

## 2012-11-19 ENCOUNTER — Other Ambulatory Visit: Payer: Medicare Other | Admitting: Lab

## 2012-11-19 ENCOUNTER — Ambulatory Visit: Payer: Medicare Other

## 2012-11-19 DIAGNOSIS — C7949 Secondary malignant neoplasm of other parts of nervous system: Secondary | ICD-10-CM | POA: Insufficient documentation

## 2012-11-19 DIAGNOSIS — C7931 Secondary malignant neoplasm of brain: Secondary | ICD-10-CM

## 2012-11-19 DIAGNOSIS — C342 Malignant neoplasm of middle lobe, bronchus or lung: Secondary | ICD-10-CM | POA: Insufficient documentation

## 2012-11-19 DIAGNOSIS — R5381 Other malaise: Secondary | ICD-10-CM | POA: Insufficient documentation

## 2012-11-19 DIAGNOSIS — R739 Hyperglycemia, unspecified: Secondary | ICD-10-CM | POA: Diagnosis present

## 2012-11-19 DIAGNOSIS — C3490 Malignant neoplasm of unspecified part of unspecified bronchus or lung: Secondary | ICD-10-CM | POA: Diagnosis present

## 2012-11-19 DIAGNOSIS — R5383 Other fatigue: Secondary | ICD-10-CM | POA: Insufficient documentation

## 2012-11-19 DIAGNOSIS — R12 Heartburn: Secondary | ICD-10-CM | POA: Insufficient documentation

## 2012-11-19 DIAGNOSIS — C3430 Malignant neoplasm of lower lobe, unspecified bronchus or lung: Secondary | ICD-10-CM | POA: Insufficient documentation

## 2012-11-19 DIAGNOSIS — Z510 Encounter for antineoplastic radiation therapy: Secondary | ICD-10-CM | POA: Insufficient documentation

## 2012-11-19 LAB — CBC
HCT: 37.3 % (ref 36.0–46.0)
Hemoglobin: 12.4 g/dL (ref 12.0–15.0)
MCH: 31.8 pg (ref 26.0–34.0)
MCV: 95.6 fL (ref 78.0–100.0)
RBC: 3.9 MIL/uL (ref 3.87–5.11)

## 2012-11-19 LAB — BASIC METABOLIC PANEL
BUN: 22 mg/dL (ref 6–23)
CO2: 26 mEq/L (ref 19–32)
Calcium: 8.2 mg/dL — ABNORMAL LOW (ref 8.4–10.5)
Creatinine, Ser: 0.7 mg/dL (ref 0.50–1.10)
Glucose, Bld: 194 mg/dL — ABNORMAL HIGH (ref 70–99)

## 2012-11-19 LAB — GLUCOSE, CAPILLARY
Glucose-Capillary: 150 mg/dL — ABNORMAL HIGH (ref 70–99)
Glucose-Capillary: 142 mg/dL — ABNORMAL HIGH (ref 70–99)
Glucose-Capillary: 160 mg/dL — ABNORMAL HIGH (ref 70–99)
Glucose-Capillary: 244 mg/dL — ABNORMAL HIGH (ref 70–99)

## 2012-11-19 NOTE — Progress Notes (Signed)
[facility] PROGRESS NOTE  [name] VWU:[id number] DOB: 1951/04/03 DOA: 11/16/2012 PCP: Ernestine Conrad, MD  Brief narrative: [name] is an 62 y.o. female with a past medical history of atrial fibrillation, tobacco abuse, COPD, and non-small cell lung cancer diagnosed 04/29/2012, under the care of Dr. Arbutus Ped, who presented to [facility] with a chief complaint of unsteady gait and headache for 2 weeks prior to presentation. An initial CT scan of the head was done which showed a cerebellar lesion consistent with brain metastasis. She subsequently was transferred to [facility] for further evaluation and treatment.  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 -Patient started on Decadron with loading dose of 10 mg, continuing with 4 mg every 6 hours.  -The patient is unable to have an MRI secondary to history of pacemaker, underwent CT simulation 11/19/2012. -Seen by Dr. Mitzi Hansen, radiation oncologist and Dr. Arbutus Ped, oncologist with plans to proceed with radiation therapy to the brain, with first treatment schedule 11/20/2012. -Dr. Arbutus Ped to consider systemic chemotherapy after completion of her radiation therapy. Active Problems:   Steroid-induced hyperglycemia -Continue sliding scale insulin, sensitive scale. CBGs 142-218.   Leukocytosis -No infectious etiology identified. Suspect a demargination reaction to steroids.   Atrial fibrillation / cardiac pacemaker in situ -Patient heart rate is controlled with Bystolic and Tambocor.  -Currently in sinus rhythm.   COPD (chronic obstructive pulmonary disease) -Continue albuterol as needed.   Essential hypertension, benign -Continue current medications including by systolic, Avapro, Norvasc, hydrochlorothiazide and Tambocor.   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 We'll request physical therapy evaluation.  Code Status: Full. Family Communication: None at bedside. Disposition Plan: Home versus skilled nursing home placement for rehabilitation depending on PT evaluation.    Medical Consultants:  Dr. Jonna Coup, Radiation Oncology.  Dr. Si Gaul, Oncology.  Other Consultants:  Physical therapy  Anti-infectives:  None  HPI/Subjective: [name] is feeling okay. She denies nausea currently, but states she was fairly nauseated when she first came in. No complaints of pain or constipation. She has not really gotten up out of bed since she came in. Appetite is fair.  Objective: Filed Vitals:   11/18/12 1344 11/18/12 2054 11/19/12 0511 11/19/12 0930  BP: 114/58 116/60 140/56 129/59  Pulse: 63 64 71 59  Temp: 97.7 F (36.5 C) 98 F (36.7 C) 97.6 F (36.4 C) 97.7 F (36.5 C)  TempSrc: Oral Oral Oral Oral  Resp: 18 18 18 18   Height:      Weight:      SpO2: 98% 96% 97% 98%    Intake/Output Summary (Last 24 hours) at 11/19/12 1407 Last data filed at 11/19/12 1339  Gross per 24 hour  Intake   2620 ml  Output   1600 ml  Net   1020 ml    Exam: Gen:  NAD Cardiovascular:  RRR, No M/R/G Respiratory:  Lungs with right-sided high-pitched wheezes Gastrointestinal:  Abdomen soft, NT/ND, + BS Extremities:  1+ edema  Data Reviewed: Basic Metabolic Panel:  Recent Labs Lab 11/12/12 1433  11/16/12 1603 11/18/12 0330 11/19/12 0350  NA 141  --  137 138 137  K 3.3*  < > 3.8 3.8 4.0  CL 103  --  100 104 104  CO2 26  --  27 27 26   GLUCOSE 82  --  116* 169*  194*  BUN 15.3  --  16 21 22   CREATININE 0.8  --  0.71 0.74 0.70  CALCIUM 9.4  --  9.1 8.5 8.2*  MG 1.9  --   --   --   --   < > = values in this interval not displayed. GFR Estimated Creatinine Clearance: 69.8 ml/min (by C-G formula based on Cr of 0.7). Liver Function Tests:  Recent Labs Lab 11/12/12 1433 11/16/12 1603  AST 21 19  ALT 18 18   ALKPHOS 63 47  BILITOT 0.42 0.4  PROT 6.9 6.5  ALBUMIN 3.7 3.5   Coagulation profile  Recent Labs Lab 11/12/12 1433  INR 2.09*    CBC:  Recent Labs Lab 11/12/12 1433 11/16/12 1603 11/18/12 0330 11/19/12 0350  WBC 9.4 12.4* 20.7* 20.3*  NEUTROABS 7.2* 11.2*  --   --   HGB 14.3 13.4 12.3 12.4  HCT 41.2 38.8 37.0 37.3  MCV 93.2 94.2 94.6 95.6  PLT 159 166 171 178   CBG:  Recent Labs Lab 11/18/12 1154 11/18/12 1717 11/18/12 2026 11/19/12 0733 11/19/12 1214  GLUCAP 218* 176* 153* 150* 142*    Procedures and Diagnostic Studies: Ct Head Wo Contrast  11/16/2012  *RADIOLOGY REPORT*  Clinical Data: Vomiting, dizziness  CT HEAD WITHOUT CONTRAST  Technique:  Contiguous axial images were obtained from the base of the skull through the vertex without contrast.  Comparison: CT brain scan of 05/21/2012  Findings: There is a large rounded lesion within the right cerebellum displacing the midline to the left and slightly impinging upon the fourth ventricle.  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  There is a question of a second lesion high in the right cerebellum or possibly in the right posterior parietal lobe measuring 2.5 x 2.0 cm.  No acute hemorrhage is seen.  The ventricular system is stable in size and configuration and the septum is in a midline position.  MRI is recommended for more sensitive assessment of possible additional metastatic lesions.  Mild small vessel ischemic change is present in the periventricular white matter.  No calvarial abnormality is seen.  IMPRESSION:  1.  Rounded peripherally enhancing lesion in the right cerebellar hemisphere with questionable second lesion possibly the right posterior parietal lobe suspicious for metastatic lesions.  MRI of the brain is recommended. 2.  Mild small vessel ischemic change.  I discussed the findings of this study with Dr. Estell Harpin on 11/16/2012 at  4:50 p.m.   Original Report Authenticated By: Dwyane Dee, M.D.    Ct Chest W Contrast  10/22/2012  *RADIOLOGY REPORT*  Clinical Data: Cough and shortness of breath.  Lung cancer diagnosed July 2013.  Pre chemotherapy.  CT CHEST WITH CONTRAST  Technique:  Multidetector CT imaging of the chest was performed following the standard protocol during bolus administration of intravenous contrast.  Contrast: 80mL OMNIPAQUE IOHEXOL 300 MG/ML  SOLN  Comparison: PET CT 05/21/2012, chest CT 02/11/2012  Findings: Irregular right middle lobe pulmonary nodule now measures 2.2 x 2.0 cm image 37 (previously 3.5 x 3.1 cm), with peripheral curvilinear pleural parenchymal nodularity, image 37.  Irregular right lower lobe pulmonary nodule now measures 1.1 x 0.8 cm image 43 (previously 1.2 x 1.3 cm).  Curvilinear left lower lobe scarring image 41 is stable.  Calcified granuloma superior segment right lower lobe image 25 is stable.  Moderate emphysematous changes are again noted.  No new pulmonary mass, nodule, or  consolidation is identified.  Thoracic spine degenerative change is re-identified as well as endplate sclerosis at T9-T10 most compatible with degenerative change.  Great vessels are normal in caliber.  Moderate atherosclerotic aortic calcification without aneurysm.  Left-sided pacer in place. Heart size is normal.  Small pretracheal nodes are stable.  No new hilar lymphadenopathy.  Incomplete imaging of the upper abdomen demonstrates a new irregular centrally hypodense 3.3 x 2.3 cm left adrenal mass image 55.  Too small to characterize splenic and hepatic hypodense lesions are again noted, subjectively unchanged.  IMPRESSION: New left adrenal mass, likely centrally necrotic, highly suspicious for interval development of metastatic disease.  Decrease in size of right middle lobe and right lower lobe masses compatible with the provided history of lung cancer.  These results will be called to the ordering clinician or  representative by the Radiologist Assistant, and communication documented in the PACS Dashboard.   Original Report Authenticated By: Christiana Pellant, M.D.     Scheduled Meds: . amLODipine  5 mg Oral Daily  . aspirin EC  81 mg Oral Daily  . atorvastatin  40 mg Oral q1800  . dexamethasone  4 mg Intravenous Q6H  . flecainide  100 mg Oral BID  . heparin  5,000 Units Subcutaneous Q8H  . hydrochlorothiazide  25 mg Oral Daily  . insulin aspart  0-9 Units Subcutaneous TID WC  . irbesartan  300 mg Oral Daily  . nebivolol  10 mg Oral Daily  . polyethylene glycol  17 g Oral Daily   Continuous Infusions: . sodium chloride 75 mL/hr at 11/19/12 0016    Time spent: 35 minutes.   LOS: 3 days   RAMA,CHRISTINA  [facility] Pager [phone].  If 8PM-8AM, please contact night-coverage at [email or web address], password [facility] 11/19/2012, 2:07 PM

## 2012-11-19 NOTE — Progress Notes (Signed)
  Radiation Oncology         [phone] ________________________________  Name: [name] MRN: [id number]  Date: 11/19/2012  DOB: 10-16-1950    Simulation and treatment planning note  The patient presented for simulation for the patient's upcoming course of whole brain radiation treatment. The patient was placed in a supine position and a customized thermoplastic head cast was constructed to aid in patient immobilization during the treatment. This complex treatment device will be used on a daily basis. In this fashion a CT scan was obtained through the head and neck region and isocenter was placed near midline within the brain.  The patient will be planned to receive a course of whole brain radiation treatment to a dose of 30 gray in 10 fractions at 3 gray per fraction. To accomplish this, 2 customized blocks have been designed which corresponds to left and right whole brain radiation fields. These 2 complex treatment devices will be used on a daily basis during the course of radiation. A complex isodose plan is requested to insure that the target area is adequately covered in to facilitate optimization of the treatment plan. A forward planning technique will also be evaluated to determine if this approach significantly improves the plan.   ________________________________   Radene Gunning, MD, PhD

## 2012-11-19 NOTE — Progress Notes (Signed)
MD, Patient prefers not to have Heparin as her VTE prophylaxis.  Pt.'s abdomen is bruised and painful.[name]

## 2012-11-20 ENCOUNTER — Ambulatory Visit
Admit: 2012-11-20 | Discharge: 2012-11-20 | Disposition: A | Discharge status: Home / Self Care | Payer: Medicare Other | Attending: Radiation Oncology | Admitting: Radiation Oncology

## 2012-11-20 DIAGNOSIS — C7931 Secondary malignant neoplasm of brain: Secondary | ICD-10-CM

## 2012-11-20 LAB — GLUCOSE, CAPILLARY
Glucose-Capillary: 233 mg/dL — ABNORMAL HIGH (ref 70–99)
Glucose-Capillary: 150 mg/dL — ABNORMAL HIGH (ref 70–99)

## 2012-11-20 MED ORDER — INSULIN ASPART 100 UNIT/ML ~~LOC~~ SOLN
0.0000 [IU] | Freq: Three times a day (TID) | SUBCUTANEOUS | Status: DC
  Administered 2012-11-20 (×2): 2 [IU] via SUBCUTANEOUS

## 2012-11-20 MED ORDER — INSULIN ASPART 100 UNIT/ML ~~LOC~~ SOLN: 0.0000 [IU] | Freq: Every day | SUBCUTANEOUS | Status: DC

## 2012-11-20 MED ORDER — ENOXAPARIN SODIUM 40 MG/0.4ML ~~LOC~~ SOLN
40.0000 mg | SUBCUTANEOUS | Status: DC
  Administered 2012-11-20: 40 mg via SUBCUTANEOUS
  Filled 2012-11-20 (×3): qty 0.4

## 2012-11-20 NOTE — Progress Notes (Addendum)
PT Cancellation Note  Patient Details Name: [name] MRN: [id number] DOB: 25-Jan-1951   Cancelled Treatment:   (a.m.) Reason Eval/Treat Not Completed: Patient at procedure or test/unavailable;Other (comment) (Pt heading to radiation right now, will try to check back as time permits )   (p.m.)  16:10... Checked on pt now that she had returned from radiation.  Pt is willing to work with PT and feels she is fairly steady .  States she has been walking to /from bathroom in room.  She does have a RW at home but 19 steps to enter.  Hopes to return home tomorrow.  She politely declined our PT evaluation today, but willing to participate tomorrow.    Marella Bile 11/20/2012, 12:36 PM

## 2012-11-20 NOTE — Progress Notes (Signed)
  Radiation Oncology         [phone] ________________________________  Name: [name] MRN: [id number]  Date: 11/20/2012  DOB: Feb 17, 1951  Simulation Verification Note   NARRATIVE: The patient was brought to the treatment unit and placed in the planned treatment position. The clinical setup was verified. Then port films were obtained and uploaded to the radiation oncology medical record software.  The treatment beams were carefully compared against the planned radiation fields. The position, location, and shape of the radiation fields was reviewed. The targeted volume of tissue appears to be appropriately covered by the radiation beams. Based on my personal review, I approved the simulation verification. The patient's treatment will proceed as planned.  ________________________________   Radene Gunning, MD, PhD

## 2012-11-20 NOTE — Progress Notes (Signed)
[facility] PROGRESS NOTE  [name] WUJ:[id number] DOB: 1951-05-21 DOA: 11/16/2012 PCP: Ernestine Conrad, MD  Brief narrative: IREENE BALLOWE is an 62 y.o. female with a past medical history of atrial fibrillation, tobacco abuse, COPD, and non-small cell lung cancer diagnosed 04/29/2012, under the care of Dr. Arbutus Ped, who presented to [facility] with a chief complaint of unsteady gait and headache for 2 weeks prior to presentation. An initial CT scan of the head was done which showed a cerebellar lesion consistent with brain metastasis. She subsequently was transferred to [facility] for further evaluation and treatment.  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 -Patient started on Decadron with loading dose of 10 mg, continuing with 4 mg every 6 hours.  -The patient is unable to have an MRI secondary to history of pacemaker, underwent CT simulation 11/19/2012. -Seen by Dr. Mitzi Hansen, radiation oncologist and Dr. Arbutus Ped, oncologist with plans to proceed with radiation therapy to the brain, with first treatment scheduled 11/20/2012. -Dr. Arbutus Ped to consider systemic chemotherapy after completion of her radiation therapy. Active Problems:   Steroid-induced hyperglycemia -CBGs 142-244. Change SSI to moderate scale.   Leukocytosis -No infectious etiology identified. Suspect a demargination reaction to steroids.   Atrial fibrillation / cardiac pacemaker in situ -Patient heart rate is controlled with Bystolic and Tambocor.  -Currently in sinus rhythm.   COPD (chronic obstructive pulmonary disease) -Continue albuterol as needed.   Essential hypertension, benign -Continue current medications including by systolic, Avapro, Norvasc, hydrochlorothiazide and Tambocor.   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 Physical  therapy evaluation pending.  Code Status: Full. Family Communication: None at bedside. Disposition Plan: Home versus skilled nursing home placement for rehabilitation depending on PT evaluation.    Medical Consultants:  Dr. Jonna Coup, Radiation Oncology.  Dr. Si Gaul, Oncology.  Other Consultants:  Physical therapy  Anti-infectives:  None  HPI/Subjective: [name] is feeling well. No current complaints of pain, nausea, or other symptoms.  Objective: Filed Vitals:   11/19/12 0930 11/19/12 1451 11/19/12 2122 11/20/12 0440  BP: 129/59 112/63 121/57 119/54  Pulse: 59 63 64 65  Temp: 97.7 F (36.5 C) 97.8 F (36.6 C) 97.8 F (36.6 C) 97.6 F (36.4 C)  TempSrc: Oral Oral Oral Oral  Resp: 18 18 18 18   Height:      Weight:      SpO2: 98% 97% 98% 97%    Intake/Output Summary (Last 24 hours) at 11/20/12 1108 Last data filed at 11/20/12 0300  Gross per 24 hour  Intake    795 ml  Output    900 ml  Net   -105 ml    Exam: Gen:  NAD Cardiovascular:  RRR, No M/R/G Respiratory:  Lungs with right-sided high-pitched wheezes Gastrointestinal:  Abdomen soft, NT/ND, + BS Extremities:  1+ edema  Data Reviewed: Basic Metabolic Panel:  Recent Labs Lab 11/16/12 1603 11/18/12 0330 11/19/12 0350  NA 137 138 137  K 3.8 3.8 4.0  CL 100 104 104  CO2 27 27 26   GLUCOSE 116* 169* 194*  BUN 16 21 22   CREATININE 0.71 0.74 0.70  CALCIUM 9.1 8.5 8.2*   GFR Estimated Creatinine Clearance: 69.8 ml/min (by C-G formula based on Cr of 0.7). Liver Function Tests:  Recent Labs Lab 11/16/12 1603  AST 19  ALT 18  ALKPHOS 47  BILITOT 0.4  PROT 6.5  ALBUMIN 3.5    CBC:  Recent Labs Lab 11/16/12 1603 11/18/12 0330 11/19/12 0350  WBC 12.4* 20.7* 20.3*  NEUTROABS 11.2*  --   --   HGB 13.4 12.3 12.4  HCT 38.8 37.0 37.3  MCV 94.2 94.6 95.6  PLT 166 171 178   CBG:  Recent Labs Lab 11/18/12 2026 11/19/12 0733 11/19/12 1214 11/19/12 1644  11/19/12 2005  GLUCAP 153* 150* 142* 160* 244*    Procedures and Diagnostic Studies: Ct Head Wo Contrast  11/16/2012  *RADIOLOGY REPORT*  Clinical Data: Vomiting, dizziness  CT HEAD WITHOUT CONTRAST  Technique:  Contiguous axial images were obtained from the base of the skull through the vertex without contrast.  Comparison: CT brain scan of 05/21/2012  Findings: There is a large rounded lesion within the right cerebellum displacing the midline to the left and slightly impinging upon the fourth ventricle.  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  There is a question of a second lesion high in the right cerebellum or possibly in the right posterior parietal lobe measuring 2.5 x 2.0 cm.  No acute hemorrhage is seen.  The ventricular system is stable in size and configuration and the septum is in a midline position.  MRI is recommended for more sensitive assessment of possible additional metastatic lesions.  Mild small vessel ischemic change is present in the periventricular white matter.  No calvarial abnormality is seen.  IMPRESSION:  1.  Rounded peripherally enhancing lesion in the right cerebellar hemisphere with questionable second lesion possibly the right posterior parietal lobe suspicious for metastatic lesions.  MRI of the brain is recommended. 2.  Mild small vessel ischemic change.  I discussed the findings of this study with Dr. Estell Harpin on 11/16/2012 at 4:50 p.m.   Original Report Authenticated By: Dwyane Dee, M.D.    Ct Chest W Contrast  10/22/2012  *RADIOLOGY REPORT*  Clinical Data: Cough and shortness of breath.  Lung cancer diagnosed July 2013.  Pre chemotherapy.  CT CHEST WITH CONTRAST  Technique:  Multidetector CT imaging of the chest was performed following the standard protocol during bolus administration of intravenous contrast.  Contrast: 80mL OMNIPAQUE IOHEXOL 300 MG/ML  SOLN  Comparison: PET CT 05/21/2012, chest  CT 02/11/2012  Findings: Irregular right middle lobe pulmonary nodule now measures 2.2 x 2.0 cm image 37 (previously 3.5 x 3.1 cm), with peripheral curvilinear pleural parenchymal nodularity, image 37.  Irregular right lower lobe pulmonary nodule now measures 1.1 x 0.8 cm image 43 (previously 1.2 x 1.3 cm).  Curvilinear left lower lobe scarring image 41 is stable.  Calcified granuloma superior segment right lower lobe image 25 is stable.  Moderate emphysematous changes are again noted.  No new pulmonary mass, nodule, or consolidation is identified.  Thoracic spine degenerative change is re-identified as well as endplate sclerosis at T9-T10 most compatible with degenerative change.  Great vessels are normal in caliber.  Moderate atherosclerotic aortic calcification without aneurysm.  Left-sided pacer in place. Heart size is normal.  Small pretracheal nodes are stable.  No new hilar lymphadenopathy.  Incomplete imaging of the upper abdomen demonstrates a new irregular centrally hypodense 3.3 x 2.3 cm left adrenal mass image 55.  Too small to characterize splenic and hepatic hypodense lesions are again noted, subjectively unchanged.  IMPRESSION: New left adrenal mass, likely centrally necrotic, highly suspicious for interval development of metastatic disease.  Decrease in size of  right middle lobe and right lower lobe masses compatible with the provided history of lung cancer.  These results will be called to the ordering clinician or representative by the Radiologist Assistant, and communication documented in the PACS Dashboard.   Original Report Authenticated By: Christiana Pellant, M.D.     Scheduled Meds: . amLODipine  5 mg Oral Daily  . aspirin EC  81 mg Oral Daily  . atorvastatin  40 mg Oral q1800  . dexamethasone  4 mg Intravenous Q6H  . enoxaparin (LOVENOX) injection  40 mg Subcutaneous Q24H  . flecainide  100 mg Oral BID  . hydrochlorothiazide  25 mg Oral Daily  . insulin aspart  0-9 Units Subcutaneous  TID WC  . irbesartan  300 mg Oral Daily  . nebivolol  10 mg Oral Daily  . polyethylene glycol  17 g Oral Daily   Continuous Infusions: . sodium chloride 75 mL/hr at 11/20/12 0517    Time spent: 25 minutes.   LOS: 4 days   RAMA,CHRISTINA  [facility] Pager [phone].  If 8PM-8AM, please contact night-coverage at [email or web address], password [facility] 11/20/2012, 11:08 AM

## 2012-11-21 ENCOUNTER — Ambulatory Visit
Admit: 2012-11-21 | Discharge: 2012-11-21 | Disposition: A | Discharge status: Home / Self Care | Payer: Medicare Other | Attending: Radiation Oncology | Admitting: Radiation Oncology

## 2012-11-21 VITALS — BP 122/49 | HR 59 | Temp 98.1°F | Wt 175.8 lb

## 2012-11-21 DIAGNOSIS — C7949 Secondary malignant neoplasm of other parts of nervous system: Secondary | ICD-10-CM

## 2012-11-21 DIAGNOSIS — C3490 Malignant neoplasm of unspecified part of unspecified bronchus or lung: Secondary | ICD-10-CM

## 2012-11-21 LAB — GLUCOSE, CAPILLARY: Glucose-Capillary: 141 mg/dL — ABNORMAL HIGH (ref 70–99)

## 2012-11-21 MED ORDER — DEXAMETHASONE 4 MG PO TABS: 4.0000 mg | ORAL_TABLET | Freq: Four times a day (QID) | ORAL | Status: DC

## 2012-11-21 MED ORDER — OXYCODONE-ACETAMINOPHEN 10-325 MG PO TABS: 1.0000 | ORAL_TABLET | Freq: Three times a day (TID) | ORAL | Status: DC | PRN

## 2012-11-21 NOTE — Evaluation (Signed)
Physical Therapy Evaluation Patient Details Name: [name] MRN: [id number] DOB: [birth year] Today's Date: 11/21/2012 Time: 1478-2956 PT Time Calculation (min): 24 min  PT Assessment / Plan / Recommendation Clinical Impression  62 yo female with hx of lung Ca now found to have new brain mets. Pt has ataxic gait that is improved with use of RW and also appears to have some expressive difficuties.  She would benefit from OT and ST consult.  Recommend HHPT and RW for home    PT Assessment  Patient needs continued PT services    Follow Up Recommendations  Home health PT    Does the patient have the potential to tolerate intense rehabilitation      Barriers to Discharge Decreased caregiver support      Equipment Recommendations  Rolling walker with 5" wheels    Recommendations for Other Services OT consult;Speech consult   Frequency Min 3X/week    Precautions / Restrictions Precautions Precautions: Fall Precaution Comments: pt unsteady and anxious when walking without device   Pertinent Vitals/Pain No c/o pain      Mobility  Bed Mobility Bed Mobility: Supine to Sit;Sit to Supine Supine to Sit: 7: Independent Sit to Supine: 7: Independent Transfers Transfers: Sit to Stand;Stand to Sit Sit to Stand: 7: Independent Stand to Sit: 7: Independent Ambulation/Gait Ambulation/Gait Assistance: 5: Supervision Assistive device: Rolling walker Gait Pattern: Ataxic Gait velocity: pt is able to change speeds General Gait Details: pt is much more secure with use of RW.  She did not get enough support from single point cane Stairs: Yes Stairs Assistance: 4: Min guard Stair Management Technique: One rail Right;Alternating pattern;Forwards Number of Stairs: 4 Wheelchair Mobility Wheelchair Mobility: No    Exercises Other Exercises Other Exercises: repeated sit to stand Other Exercises: standing balance with bilateral UE movement to challenge core muscles   PT Diagnosis:  Abnormality of gait;Difficulty walking  PT Problem List: Decreased balance;Decreased knowledge of use of DME PT Treatment Interventions: DME instruction;Gait training;Stair training;Functional mobility training;Therapeutic activities;Therapeutic exercise;Balance training;Patient/family education   PT Goals Acute Rehab PT Goals PT Goal Formulation: With patient Time For Goal Achievement: 12/05/12 Potential to Achieve Goals: Good Pt will Ambulate: >150 feet;with modified independence;with least restrictive assistive device PT Goal: Ambulate - Progress: Goal set today Pt will Go Up / Down Stairs: Flight;with modified independence PT Goal: Up/Down Stairs - Progress: Goal set today Pt will Perform Home Exercise Program: Independently PT Goal: Perform Home Exercise Program - Progress: Goal set today  Visit Information  Last PT Received On: 11/21/12    Subjective Data  Subjective: Pt appears to have some word finding difficulties Patient Stated Goal: to go home   Prior Functioning  Home Living Lives With: Alone Home Access: Stairs to enter Entrance Stairs-Number of Steps: 19 Additional Comments: pt states he in-laws are nearby .  Difficult to get a clear picture of home environment Prior Function Level of Independence: Independent Able to Take Stairs?: Yes Communication Communication: Expressive difficulties    Cognition  Cognition Overall Cognitive Status: Appears within functional limits for tasks assessed/performed Arousal/Alertness: Awake/alert Orientation Level: Appears intact for tasks assessed Behavior During Session: Anxious Cognition - Other Comments: pt appears anxious and concerned about her difficutlies with movement and speech    Extremity/Trunk Assessment Right Lower Extremity Assessment RLE ROM/Strength/Tone: WFL for tasks assessed RLE Sensation: WFL - Light Touch;WFL - Proprioception Left Lower Extremity Assessment LLE ROM/Strength/Tone: WFL for tasks  assessed LLE Sensation: WFL - Light Touch;WFL - Proprioception Trunk  Assessment Trunk Assessment: Normal   Balance Balance Balance Assessed: Yes Static Sitting Balance Static Sitting - Balance Support: No upper extremity supported Static Sitting - Level of Assistance: 7: Independent Static Standing Balance Static Standing - Balance Support: No upper extremity supported;During functional activity Static Standing - Level of Assistance: 7: Independent Static Standing - Comment/# of Minutes: pt with more difficutly with narrow base of support.  Unable to tandem stand at all Standardized Balance Assessment Standardized Balance Assessment: Dynamic Gait Index Dynamic Gait Index Level Surface: Normal (with RW for all ) Change in Gait Speed: Normal Gait with Horizontal Head Turns: Mild Impairment Gait with Vertical Head Turns: Mild Impairment Gait and Pivot Turn: Mild Impairment Step Over Obstacle: Mild Impairment Step Around Obstacles: Mild Impairment Steps: Mild Impairment Total Score: 18 High Level Balance High Level Balance Activites: Direction changes;Turns;Sudden stops;Head turns  End of Session PT - End of Session Activity Tolerance: Patient tolerated treatment well Patient left: in chair;with call bell/phone within reach Nurse Communication: Mobility status  GP    Bayard Hugger. Manson Passey,  [phone] 11/21/2012, 9:29 AM

## 2012-11-21 NOTE — Progress Notes (Signed)
Department of Radiation Oncology  Phone:  [phone] Fax:        [phone]  Weekly Treatment Note    Name: [name] Date: 11/21/2012 MRN: [id number] DOB: [birth year]   Current dose: 6 Gy  Current fraction: 2   MEDICATIONS: No current facility-administered medications for this encounter.   Current Outpatient Prescriptions  Medication Sig Dispense Refill  . dexamethasone (DECADRON) 4 MG tablet Take 1 tablet (4 mg total) by mouth 4 (four) times daily.  120 tablet  1  . oxyCODONE-acetaminophen (PERCOCET) 10-325 MG per tablet Take 1 tablet by mouth every 8 (eight) hours as needed. Takes once daily  And prn for knees  30 tablet  0   Facility-Administered Medications Ordered in Other Encounters  Medication Dose Route Frequency Provider Last Rate Last Dose  . acetaminophen (TYLENOL) tablet 650 mg  650 mg Oral Q6H PRN Clydia Llano, MD       Or  . acetaminophen (TYLENOL) suppository 650 mg  650 mg Rectal Q6H PRN Clydia Llano, MD      . albuterol (PROVENTIL HFA;VENTOLIN HFA) 108 (90 BASE) MCG/ACT inhaler 2 puff  2 puff Inhalation Q6H PRN Clydia Llano, MD   2 puff at 11/17/12 2243  . amLODipine (NORVASC) tablet 5 mg  5 mg Oral Daily Clydia Llano, MD   5 mg at 11/21/12 0956  . aspirin EC tablet 81 mg  81 mg Oral Daily Clydia Llano, MD   81 mg at 11/21/12 0956  . atorvastatin (LIPITOR) tablet 40 mg  40 mg Oral q1800 Clydia Llano, MD   40 mg at 11/20/12 1742  . dexamethasone (DECADRON) injection 4 mg  4 mg Intravenous Q6H Clydia Llano, MD   4 mg at 11/21/12 0549  . enoxaparin (LOVENOX) injection 40 mg  40 mg Subcutaneous Q24H Maryruth Bun Rama, MD   40 mg at 11/20/12 1017  . flecainide (TAMBOCOR) tablet 100 mg  100 mg Oral BID Clydia Llano, MD   100 mg at 11/21/12 0956  . hydrochlorothiazide (HYDRODIURIL) tablet 25 mg  25 mg Oral Daily Clydia Llano, MD   25 mg at 11/21/12 0955  . HYDROcodone-acetaminophen (NORCO/VICODIN) 5-325 MG per tablet 1-2 tablet  1-2 tablet Oral Q4H PRN Clydia Llano, MD      . insulin aspart (novoLOG) injection 0-15 Units  0-15 Units Subcutaneous TID WC Maryruth Bun Rama, MD   2 Units at 11/20/12 1742  . insulin aspart (novoLOG) injection 0-5 Units  0-5 Units Subcutaneous QHS Christina P Rama, MD      . ipratropium (ATROVENT) nebulizer solution 500 mcg  500 mcg Nebulization QID PRN Clydia Llano, MD      . irbesartan (AVAPRO) tablet 300 mg  300 mg Oral Daily Clydia Llano, MD   300 mg at 11/21/12 0955  . morphine 2 MG/ML injection 1 mg  1 mg Intravenous Q4H PRN Clydia Llano, MD      . nebivolol (BYSTOLIC) tablet 10 mg  10 mg Oral Daily Clydia Llano, MD   10 mg at 11/21/12 0955  . ondansetron (ZOFRAN) tablet 4 mg  4 mg Oral Q6H PRN Clydia Llano, MD       Or  . ondansetron (ZOFRAN) injection 4 mg  4 mg Intravenous Q6H PRN Mutaz Elmahi, MD      . polyethylene glycol (MIRALAX / GLYCOLAX) packet 17 g  17 g Oral Daily Clydia Llano, MD   17 g at 11/20/12 1017     ALLERGIES: Latex   LABORATORY DATA:  Lab Results  Component Value Date   WBC 20.3* 11/19/2012   HGB 12.4 11/19/2012   HCT 37.3 11/19/2012   MCV 95.6 11/19/2012   PLT 178 11/19/2012   Lab Results  Component Value Date   NA 137 11/19/2012   K 4.0 11/19/2012   CL 104 11/19/2012   CO2 26 11/19/2012   Lab Results  Component Value Date   ALT 18 11/16/2012   AST 19 11/16/2012   ALKPHOS 47 11/16/2012   BILITOT 0.4 11/16/2012     NARRATIVE: [name] was seen today for weekly treatment management. The chart was checked and the patient's films were reviewed. The patient states that she is doing fine with treatment. No problems so far. Some continued changes in speech but this has not worsened at all.  PHYSICAL EXAMINATION: weight is 175 lb 12.8 oz (79.742 kg). Her temperature is 98.1 F (36.7 C). Her blood pressure is 122/49 and her pulse is 59. Her oxygen saturation is 98%.        ASSESSMENT: The patient is doing satisfactorily with treatment.  PLAN: We will continue with the patient's  radiation treatment as planned.

## 2012-11-21 NOTE — Discharge Summary (Addendum)
Physician Discharge Summary  [name] RUE:[id number] DOB: [birth year] DOA: 11/16/2012  PCP: Ernestine Conrad, MD  Admit date: 11/16/2012 Discharge date: 11/21/2012  Recommendations for Outpatient Follow-up:  1. Home health physical and occupational therapy set up for the patient prior to discharge.   Discharge Diagnoses:  Principal Problem:    Metastatic adenocarcinoma to brain Active Problems:    Atrial fibrillation    COPD (chronic obstructive pulmonary disease)    Cardiac pacemaker in situ    Essential hypertension, benign    Ataxia    Non-small cell lung cancer    Steroid-induced hyperglycemia   Discharge Condition: Improved.  Diet recommendation: Regular.   History of present illness:  [name] is an 62 y.o. female with a past medical history of atrial fibrillation, tobacco abuse, COPD, and non-small cell lung cancer diagnosed 04/29/2012, under the care of Dr. Arbutus Ped, who presented to [facility] with a chief complaint of unsteady gait and headache for 2 weeks prior to presentation. An initial CT scan of the head was done which showed a cerebellar lesion consistent with brain metastasis. She subsequently was transferred to [facility] for further evaluation and treatment, where she has been seen by the radiation oncologist and started on brain radiation therapy.  Hospital Course by problem:  Principal Problem:  Metastatic adenocarcinoma to brain / non-small cell lung cancer  -CT scan of the head done in August was negative, CT done 11/16/2012 showed probable brain metastases.  -Patient started on Decadron with loading dose of 10 mg, continuing with 4 mg every 6 hours. She was put on oral therapy at discharge. -The patient is unable to have an MRI secondary to history of pacemaker, underwent CT simulation 11/19/2012.  -Seen by Dr. Mitzi Hansen, radiation oncologist and Dr. Arbutus Ped, oncologist with plans to proceed with radiation therapy to the brain, with  first treatment given 11/20/2012.  -Dr. Arbutus Ped to consider systemic chemotherapy after completion of her radiation therapy.  Active Problems:  Steroid-induced hyperglycemia  -Was covered with sliding scale insulin while in the hospital. Sugars should normalize with taper of steroids.  Leukocytosis  -No infectious etiology identified. Suspect a demargination reaction to steroids.  Atrial fibrillation / cardiac pacemaker in situ  -Patient heart rate is controlled with Bystolic and Tambocor.  -Currently in sinus rhythm.  -Anticoagulated with Coumadin. COPD (chronic obstructive pulmonary disease)  -Continue albuterol as needed.  Essential hypertension, benign  -Continue current medications including by systolic, Avapro, Norvasc, hydrochlorothiazide and Tambocor.  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 Physical therapy evaluation done 11/21/2012. -Set up with home physical, occupational therapy and a rolling walker.  Procedures:  Radiation simulation and treatment planning performed by Dr. Mitzi Hansen on 11/19/2012.  Medical Consultants:  Dr. Jonna Coup, Radiation Oncology.  Dr. Si Gaul, Oncology.   Discharge Exam: Filed Vitals:   11/21/12 0503  BP: 126/62  Pulse: 66  Temp: 97.5 F (36.4 C)  Resp: 16   Filed Vitals:   11/20/12 1456 11/20/12 2223 11/21/12 0503 11/21/12 0855  BP: 141/65 140/60 126/62   Pulse: 93 90 66   Temp: 97.7 F (36.5 C) 98 F (36.7 C) 97.5 F (36.4 C)   TempSrc: Oral Oral Oral   Resp: 18 18 16    Height:      Weight:      SpO2: 96% 96% 98% 94%    Gen: NAD  Cardiovascular: RRR, No M/R/G  Respiratory: Lungs with right-sided high-pitched wheezes  Gastrointestinal: Abdomen soft, NT/ND, + BS  Extremities: 1+ edema  Discharge Instructions  Discharge Orders   Future Appointments Provider Department Dept Phone   11/24/2012 1:40 PM [facility] [phone]   11/25/2012 2:20 PM [facility] [phone]   11/26/2012 1:30 PM [facility] [phone]   11/27/2012 1:30 PM [facility] [phone]   11/28/2012 1:30 PM [facility] [phone]   12/01/2012 1:30 PM [facility] [phone]   12/02/2012 1:30 PM [facility] [phone]   12/03/2012 1:30 PM [facility] [phone]   03/25/2013 1:30 PM Jonna Coup, MD [facility] [phone]   Future Orders Complete By Expires     Call MD for:  difficulty breathing, headache or visual disturbances  As directed     Call MD for:  persistant nausea and vomiting  As directed     Call MD for:  severe uncontrolled pain  As directed     Diet general  As directed     Face-to-face encounter  As directed     Comments:      I Dawnelle Warman certify that this patient is under my care and that I, or a nurse practitioner or physician's assistant working with me, had a face-to-face encounter that meets the physician face-to-face encounter requirements with this patient on 11/21/2012. The encounter with the patient was in whole, or in part for the following medical condition(s) which is the primary reason for home health care (List medical condition): Brain metastasis with gait instability.    Questions:      The encounter with the patient was in whole, or in part, for the following medical condition, which is the primary reason for home health care:  Brain metastasis    I certify that, based on my findings, the following services are medically necessary home health services:  Physical therapy    My clinical findings support the need for the above services:   Unable to leave home safely without assistance and/or assistive device    Further, I certify that my clinical findings support that this patient is homebound due to:  Unable to leave home safely without assistance    To provide the following care/treatments:  PT    OT    For home use only DME Walker rolling  As directed     Home Health  As directed     Questions:      To provide the following care/treatments:  PT    OT    Increase activity slowly  As directed     Walk with assistance  As directed     Walker   As directed         Medication List    TAKE these medications       albuterol (2.5 MG/3ML) 0.083% nebulizer solution  Commonly known as:  PROVENTIL  Take 2.5 mg by nebulization every 6 (six) hours as needed for wheezing or shortness of breath.     PROAIR HFA 108 (90 BASE) MCG/ACT inhaler  Generic drug:  albuterol  Inhale 2 puffs into the lungs every 6 (six) hours as needed for wheezing or shortness of breath.     aspirin EC 81 MG tablet  Take  81 mg by mouth daily.     BYSTOLIC 10 MG tablet  Generic drug:  nebivolol  Take 10 mg by mouth daily.     dexamethasone 4 MG tablet  Commonly known as:  DECADRON  Take 1 tablet (4 mg total) by mouth 4 (four) times daily.     flecainide 100 MG tablet  Commonly known as:  TAMBOCOR  Take 1 tablet (100 mg total) by mouth 2 (two) times daily.     hyoscyamine 0.125 MG SL tablet  Commonly known as:  LEVSIN/SL  Place 1 tablet (0.125 mg total) under the tongue every 4 (four) hours as needed for cramping.     ipratropium 0.02 % nebulizer solution  Commonly known as:  ATROVENT  Take 500 mcg by nebulization 4 (four) times daily as needed for wheezing (shortness of breath).     MELATONIN ER PO  Take 5 mg by mouth at bedtime.     oxyCODONE-acetaminophen 10-325 MG per tablet  Commonly known as:  PERCOCET  Take 1 tablet by mouth every 8 (eight) hours as needed. Takes once daily  And prn for knees     potassium chloride SA 20 MEQ  tablet  Commonly known as:  KLOR-CON M20  Take 1 tablet (20 mEq total) by mouth daily.     rosuvastatin 40 MG tablet  Commonly known as:  CRESTOR  Take 1 tablet (40 mg total) by mouth daily.     TRIBENZOR 40-5-25 MG Tabs  Generic drug:  Olmesartan-Amlodipine-HCTZ  take 1 tablet once daily     warfarin 5 MG tablet  Commonly known as:  COUMADIN  5mg  daily except 2.5mg  on Tuesdays, Thursdays and Saturdays or as directed by anticoagulation clinic           Follow-up Information   Follow up with Ernestine Conrad, MD. Schedule an appointment as soon as possible for a visit in 1 week. [facility] follow up)    Contact information:   [facility] [address] [place] [address] [phone]       Follow up with Lajuana Matte., MD. (At your scheduled appt times noted below)    Contact information:   [address] [place] [address] [phone]       Follow up with Jonna Coup, MD. (At your scheduled appt times noted below)    Contact information:   [address] [place] [address] [phone]        The results of significant diagnostics from this hospitalization (including imaging, microbiology, ancillary and laboratory) are listed below for reference.    Significant Diagnostic Studies: Ct Head Wo Contrast  11/16/2012  *RADIOLOGY REPORT*  Clinical Data: Vomiting, dizziness  CT HEAD WITHOUT CONTRAST  Technique:  Contiguous axial images were obtained from the base of the skull through the vertex without contrast.  Comparison: CT brain scan of 05/21/2012  Findings: There is a large rounded lesion within the right cerebellum displacing the midline to the left and slightly impinging upon the fourth ventricle.  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  There is a question of a second lesion high in the right cerebellum or possibly in the right posterior parietal lobe measuring  2.5 x 2.0 cm.  No acute hemorrhage is seen.  The ventricular system is stable in size and configuration and the septum is in a midline position.  MRI is recommended for more sensitive assessment of possible  additional metastatic lesions.  Mild small vessel ischemic change is present in the periventricular white matter.  No calvarial abnormality is seen.  IMPRESSION:  1.  Rounded peripherally enhancing lesion in the right cerebellar hemisphere with questionable second lesion possibly the right posterior parietal lobe suspicious for metastatic lesions.  MRI of the brain is recommended. 2.  Mild small vessel ischemic change.  I discussed the findings of this study with Dr. Estell Harpin on 11/16/2012 at 4:50 p.m.   Original Report Authenticated By: Dwyane Dee, M.D.     Microbiology: No results found for this or any previous visit (from the past 240 hour(s)).   Labs:  Basic Metabolic Panel:  Recent Labs Lab 11/16/12 1603 11/18/12 0330 11/19/12 0350  NA 137 138 137  K 3.8 3.8 4.0  CL 100 104 104  CO2 27 27 26   GLUCOSE 116* 169* 194*  BUN 16 21 22   CREATININE 0.71 0.74 0.70  CALCIUM 9.1 8.5 8.2*   GFR Estimated Creatinine Clearance: 69.8 ml/min (by C-G formula based on Cr of 0.7). Liver Function Tests:  Recent Labs Lab 11/16/12 1603  AST 19  ALT 18  ALKPHOS 47  BILITOT 0.4  PROT 6.5  ALBUMIN 3.5   CBC:  Recent Labs Lab 11/16/12 1603 11/18/12 0330 11/19/12 0350  WBC 12.4* 20.7* 20.3*  NEUTROABS 11.2*  --   --   HGB 13.4 12.3 12.4  HCT 38.8 37.0 37.3  MCV 94.2 94.6 95.6  PLT 166 171 178   CBG:  Recent Labs Lab 11/19/12 2005 11/20/12 0822 11/20/12 1134 11/20/12 1633 11/20/12 2151  GLUCAP 244* 233* 150* 147* 180*    Time coordinating discharge: 35 minutes.  Signed:  Salley Boxley  Pager [phone] [facility] 11/21/2012, 12:19 PM

## 2012-11-21 NOTE — Evaluation (Signed)
Occupational Therapy Evaluation Patient Details Name: [name] MRN: [id number] DOB: 03-20-1951 Today's Date: 11/21/2012 Time: 8413-2440 OT Time Calculation (min): 24 min  OT Assessment / Plan / Recommendation Clinical Impression  This 62 year old female has new brain mets, h/o lung CA.  If pt does not leave today, we will follow her in acute.  Recommend HHOT for IADLs    OT Assessment  Patient needs continued OT Services    Follow Up Recommendations  Home health OT    Barriers to Discharge      Equipment Recommendations  None recommended by OT    Recommendations for Other Services    Frequency  Min 2X/week    Precautions / Restrictions Precautions Precautions: Fall Restrictions Weight Bearing Restrictions: No   Pertinent Vitals/Pain No pain    ADL  Grooming: Simulated;Independent Where Assessed - Grooming: Supported standing Upper Body Bathing: Simulated;Set up Where Assessed - Upper Body Bathing: Supported sit to stand Lower Body Bathing: Simulated;Set up Where Assessed - Lower Body Bathing: Unsupported sit to stand Upper Body Dressing: Performed;Set up Where Assessed - Upper Body Dressing: Unsupported standing Lower Body Dressing: Performed;Set up Where Assessed - Lower Body Dressing: Unsupported sit to stand Toilet Transfer: Supervision/safety Toilet Transfer Method: Sit to stand Toilet Transfer Equipment: Comfort height toilet Toileting - Clothing Manipulation and Hygiene: Simulated;Independent Where Assessed - Toileting Clothing Manipulation and Hygiene: Sit to stand from 3-in-1 or toilet Tub/Shower Transfer: Performed;Supervision/safety Tub/Shower Transfer Method: Science writer: Walk in shower Transfers/Ambulation Related to ADLs: supervision ambulating to bathroom.  ADL Comments: Pt wanted to wait to wash up until she is home:  got dressed.  Talked to pt about energy conservation/rest breaks.  She has 3 cats at home.  Brother  has been helping with litter boxes. Recommend hhot for iadls.      OT Diagnosis: Generalized weakness  OT Problem List: Decreased strength;Decreased activity tolerance;Pain;Impaired balance (sitting and/or standing) OT Treatment Interventions: Self-care/ADL training;Energy conservation;DME and/or AE instruction;Patient/family education;Balance training   OT Goals Acute Rehab OT Goals OT Goal Formulation: With patient Time For Goal Achievement: 12/05/12 Potential to Achieve Goals: Good Miscellaneous OT Goals Miscellaneous OT Goal #1: Pt will gather clothes at mod I level OT Goal: Miscellaneous Goal #1 - Progress: Goal set today Miscellaneous OT Goal #2: Pt will perform transfer to stand commode at mod I level OT Goal: Miscellaneous Goal #2 - Progress: Goal set today  Visit Information  Last OT Received On: 11/21/12 Assistance Needed: +1    Subjective Data  Subjective: My brother has been emptying the [employer] (has 3 and 3 cats:  uses a mask when she does it) Patient Stated Goal: home   Prior Functioning     Home Living Lives With: Alone Available Help at Discharge: Family (sister in law having sx today) Home Access: Stairs to enter Secretary/administrator of Steps: 19 Bathroom Shower/Tub: Psychologist, counselling (has seat, doesn't use) Firefighter: Standard Prior Function Level of Independence: Independent Communication Communication: Expressive difficulties         Vision/Perception     Cognition  Cognition Overall Cognitive Status: Appears within functional limits for tasks assessed/performed Arousal/Alertness: Awake/alert Orientation Level: Appears intact for tasks assessed Behavior During Session: [facility] for tasks performed Cognition - Other Comments: pt had word finding difficulties    Extremity/Trunk Assessment Right Upper Extremity Assessment RUE ROM/Strength/Tone: [facility] for tasks assessed Left Upper Extremity Assessment LUE ROM/Strength/Tone: [facility] for  tasks assessed     Mobility Transfers Sit  to Stand: 7: Independent Stand to Sit: 7: Independent     Exercise     Balance Static Standing Balance Static Standing - Level of Assistance: 7: Independent   End of Session OT - End of Session Activity Tolerance: Patient tolerated treatment well Patient left: in chair;with call bell/phone within reach  GO     Isiah Scheel 11/21/2012, 1:57 PM Marica Otter, OTR/L [phone] 11/21/2012

## 2012-11-21 NOTE — Progress Notes (Signed)
Patient here for routine weekly assessment.Has completed 2 of 10 treatments.Denies pain.Anticipates possible discharge home today.Will give appointment calendar.Reviewed routine  of clinic and possible side effects of radiation to include headache, nausea, fatigue and skin dryness and Discoloration.Given Radiation Therapy and You Booklet.

## 2012-11-21 NOTE — Progress Notes (Signed)
Pt selected [facility] for HHPT/OT referral given to in house rep.

## 2012-11-24 ENCOUNTER — Telehealth: Payer: Self-pay | Admitting: *Deleted

## 2012-11-24 ENCOUNTER — Ambulatory Visit: Payer: Medicare Other

## 2012-11-24 ENCOUNTER — Ambulatory Visit (INDEPENDENT_AMBULATORY_CARE_PROVIDER_SITE_OTHER): Payer: Medicare Other | Admitting: *Deleted

## 2012-11-24 DIAGNOSIS — I4891 Unspecified atrial fibrillation: Secondary | ICD-10-CM

## 2012-11-24 DIAGNOSIS — Z7901 Long term (current) use of anticoagulants: Secondary | ICD-10-CM

## 2012-11-24 NOTE — Telephone Encounter (Signed)
PT 12.6 and INR - 1.0  / please call with instructions/tgs

## 2012-11-24 NOTE — Telephone Encounter (Signed)
See coumadin note. 

## 2012-11-25 ENCOUNTER — Ambulatory Visit
Admit: 2012-11-25 | Discharge: 2012-11-25 | Disposition: A | Discharge status: Home / Self Care | Payer: Medicare Other | Attending: Radiation Oncology | Admitting: Radiation Oncology

## 2012-11-26 ENCOUNTER — Ambulatory Visit
Admit: 2012-11-26 | Discharge: 2012-11-26 | Disposition: A | Discharge status: Home / Self Care | Payer: Medicare Other | Attending: Radiation Oncology | Admitting: Radiation Oncology

## 2012-11-27 ENCOUNTER — Ambulatory Visit
Admit: 2012-11-27 | Discharge: 2012-11-27 | Disposition: A | Discharge status: Home / Self Care | Payer: Medicare Other | Attending: Radiation Oncology | Admitting: Radiation Oncology

## 2012-11-27 ENCOUNTER — Ambulatory Visit (INDEPENDENT_AMBULATORY_CARE_PROVIDER_SITE_OTHER): Payer: Medicare Other | Admitting: *Deleted

## 2012-11-27 DIAGNOSIS — Z7901 Long term (current) use of anticoagulants: Secondary | ICD-10-CM

## 2012-11-27 DIAGNOSIS — I4891 Unspecified atrial fibrillation: Secondary | ICD-10-CM

## 2012-11-28 ENCOUNTER — Encounter: Payer: Self-pay | Admitting: Radiation Oncology

## 2012-11-28 ENCOUNTER — Ambulatory Visit
Admission: RE | Admit: 2012-11-28 | Discharge: 2012-11-28 | Disposition: A | Discharge status: Home / Self Care | Payer: Medicare Other | Source: Ambulatory Visit | Attending: Radiation Oncology | Admitting: Radiation Oncology

## 2012-11-28 ENCOUNTER — Ambulatory Visit
Admit: 2012-11-28 | Discharge: 2012-11-28 | Disposition: A | Discharge status: Home / Self Care | Payer: Medicare Other | Attending: Radiation Oncology | Admitting: Radiation Oncology

## 2012-11-28 VITALS — BP 101/48 | HR 61 | Temp 98.7°F | Resp 20 | Wt 171.3 lb

## 2012-11-28 DIAGNOSIS — C7931 Secondary malignant neoplasm of brain: Secondary | ICD-10-CM

## 2012-11-28 DIAGNOSIS — C3490 Malignant neoplasm of unspecified part of unspecified bronchus or lung: Secondary | ICD-10-CM

## 2012-11-28 DIAGNOSIS — C7949 Secondary malignant neoplasm of other parts of nervous system: Secondary | ICD-10-CM

## 2012-11-28 MED ORDER — PANTOPRAZOLE SODIUM 40 MG PO TBEC: 40.0000 mg | DELAYED_RELEASE_TABLET | Freq: Every day | ORAL | Status: DC

## 2012-11-28 MED ORDER — DEXAMETHASONE 4 MG PO TABS: 4.0000 mg | ORAL_TABLET | ORAL | Status: DC

## 2012-11-28 NOTE — Progress Notes (Signed)
  Radiation Oncology         [phone] ________________________________  Name: [name] MRN: [id number]  Date: 11/28/2012  DOB: 01/15/1951  Weekly Radiation Therapy Management  Current Dose: 18 Gy     Planned Dose:  30 Gy  Narrative . . . . . . . . The patient presents for routine under treatment assessment.                                                     Pt denies pain, HA, vision changes. She reports occasional nausea, dizziness, unsteadiness but denies falls. Pt taking Decadron 4 mg 4 x daily. She c/o indigestion, is taking Tums for this                                 Set-up films were reviewed.                                 The chart was checked. Physical Findings. . .  weight is 171 lb 4.8 oz (77.701 kg). Her oral temperature is 98.7 F (37.1 C). Her blood pressure is 101/48 and her pulse is 61. Her respiration is 20 and oxygen saturation is 98%. . Weight essentially stable.  No significant changes.  NoThrush Impression . . . . . . . The patient is  tolerating radiation. Plan . . . . . . . . . . . . Continue treatment as planned.  Given dexamethasone taper and protonix  ________________________________  Artist Pais. Kathrynn Running, M.D.

## 2012-11-28 NOTE — Progress Notes (Signed)
Pt denies pain, HA, vision changes. She reports occasional nausea, dizziness, unsteadiness but denies falls. Pt taking Decadron 4 mg 4 x daily. She c/o indigestion, is taking Tums for this.

## 2012-12-01 ENCOUNTER — Ambulatory Visit (INDEPENDENT_AMBULATORY_CARE_PROVIDER_SITE_OTHER): Payer: Medicare Other | Admitting: *Deleted

## 2012-12-01 ENCOUNTER — Ambulatory Visit
Admission: RE | Admit: 2012-12-01 | Discharge: 2012-12-01 | Disposition: A | Discharge status: Home / Self Care | Payer: Medicare Other | Source: Ambulatory Visit | Attending: Radiation Oncology | Admitting: Radiation Oncology

## 2012-12-01 DIAGNOSIS — I4891 Unspecified atrial fibrillation: Secondary | ICD-10-CM

## 2012-12-01 DIAGNOSIS — Z7901 Long term (current) use of anticoagulants: Secondary | ICD-10-CM

## 2012-12-01 LAB — POCT INR: INR: 4.7

## 2012-12-02 ENCOUNTER — Ambulatory Visit
Admit: 2012-12-02 | Discharge: 2012-12-02 | Disposition: A | Discharge status: Home / Self Care | Payer: Medicare Other | Attending: Radiation Oncology | Admitting: Radiation Oncology

## 2012-12-03 ENCOUNTER — Ambulatory Visit
Admit: 2012-12-03 | Discharge: 2012-12-03 | Disposition: A | Discharge status: Home / Self Care | Payer: Medicare Other | Attending: Radiation Oncology | Admitting: Radiation Oncology

## 2012-12-04 ENCOUNTER — Ambulatory Visit: Payer: Medicare Other

## 2012-12-04 ENCOUNTER — Telehealth: Payer: Self-pay | Admitting: *Deleted

## 2012-12-04 ENCOUNTER — Encounter: Payer: Self-pay | Admitting: Radiation Oncology

## 2012-12-04 ENCOUNTER — Telehealth: Payer: Self-pay | Admitting: Internal Medicine

## 2012-12-04 ENCOUNTER — Ambulatory Visit
Admission: RE | Admit: 2012-12-04 | Discharge: 2012-12-04 | Disposition: A | Discharge status: Home / Self Care | Payer: Medicare Other | Source: Ambulatory Visit | Attending: Radiation Oncology | Admitting: Radiation Oncology

## 2012-12-04 VITALS — BP 91/64 | HR 67 | Temp 97.6°F | Resp 20 | Wt 166.5 lb

## 2012-12-04 DIAGNOSIS — C3490 Malignant neoplasm of unspecified part of unspecified bronchus or lung: Secondary | ICD-10-CM

## 2012-12-04 NOTE — Telephone Encounter (Signed)
Called patient to inform of appt. With Dr. Arbutus Ped on 11-13-12- arrival time - 12:15 pm, lvm for a return call.

## 2012-12-04 NOTE — Addendum Note (Signed)
Encounter addended by: Lowella Petties, RN on: 12/04/2012  3:18 PM<BR>     Documentation filed: Notes Section

## 2012-12-04 NOTE — Progress Notes (Signed)
Weekly rad txs whole brain completed 10/10 Alert oriented x3, no c/o pain,nausea,, slight sob, "normal for me" just heartburn, had rx for protonix and decadron,not taking decadron said heart burn caused by that medication, and didn't know that protonix was filled at her pharmacy , patient b/p low also, asked patient to restart her decadron and get the protonix from her pharmacy today, also check b/p before taking her b/p med, if b/p systolic under 100 she shold wait to take medication or call her primary MD and discuss this,  2:05 PM

## 2012-12-04 NOTE — Progress Notes (Signed)
Weekly Management Note Current Dose:30 Gy  Projected Dose: 30 Gy   Narrative:  The patient presents for routine under treatment assessment.  CBCT/MVCT images/Port film x-rays were reviewed.  The chart was checked. Still feeling heartburn and fatigue. Not eating much. Not taking protonix. Not taking decadron.  Not sure when appt with Arbutus Ped is.   Physical Findings:  Alert and oriented x 3.  Seems appropriate.   Vitals: There were no vitals filed for this visit. Weight:  Wt Readings from Last 3 Encounters:  12/04/12 166 lb 8 oz (75.524 kg)  11/28/12 171 lb 4.8 oz (77.701 kg)  11/16/12 168 lb 6.9 oz (76.4 kg)   Lab Results  Component Value Date   WBC 20.3* 11/19/2012   HGB 12.4 11/19/2012   HCT 37.3 11/19/2012   MCV 95.6 11/19/2012   PLT 178 11/19/2012   Lab Results  Component Value Date   CREATININE 0.70 11/19/2012   BUN 22 11/19/2012   NA 137 11/19/2012   K 4.0 11/19/2012   CL 104 11/19/2012   CO2 26 11/19/2012     Impression:  The patient is tolerating radiation.  Plan:  Continue treatment as planned. Restart Decadron per taper. Pick up protonix. Follow up with Dr. Mitzi Hansen in 1 month.  Placed order for follow up with Dr. Arbutus Ped for consideration of chemotherapy. Encouraged po fluids.

## 2012-12-04 NOTE — Progress Notes (Signed)
Patient escorted up to lobby by Antonieta Iba,, patient unsteady,  patient left her cane in the car, son ws upstairs, and was informed by Val to have son stop by the pharmacy and pick up patient's rx;s, and makes sure she started her dexamethasone as directed, son informed Val he thought patient was taking her steroids but he will double check and will make sure all meds have been picked up, will inform Dr.Wentworth 3:17 PM

## 2012-12-04 NOTE — Addendum Note (Signed)
Encounter addended by: Lowella Petties, RN on: 12/04/2012  3:07 PM<BR>     Documentation filed: Vitals Section

## 2012-12-05 ENCOUNTER — Ambulatory Visit: Payer: Medicare Other

## 2012-12-05 ENCOUNTER — Ambulatory Visit (INDEPENDENT_AMBULATORY_CARE_PROVIDER_SITE_OTHER): Payer: Medicare Other | Admitting: *Deleted

## 2012-12-05 DIAGNOSIS — I4891 Unspecified atrial fibrillation: Secondary | ICD-10-CM

## 2012-12-05 DIAGNOSIS — Z7901 Long term (current) use of anticoagulants: Secondary | ICD-10-CM

## 2012-12-05 LAB — POCT INR: INR: 1.4

## 2012-12-08 ENCOUNTER — Ambulatory Visit: Payer: Medicare Other

## 2012-12-09 ENCOUNTER — Ambulatory Visit: Payer: Medicare Other

## 2012-12-10 ENCOUNTER — Ambulatory Visit: Payer: Medicare Other

## 2012-12-10 ENCOUNTER — Ambulatory Visit (INDEPENDENT_AMBULATORY_CARE_PROVIDER_SITE_OTHER): Payer: Medicare Other | Admitting: *Deleted

## 2012-12-10 DIAGNOSIS — Z7901 Long term (current) use of anticoagulants: Secondary | ICD-10-CM

## 2012-12-10 DIAGNOSIS — I4891 Unspecified atrial fibrillation: Secondary | ICD-10-CM

## 2012-12-11 ENCOUNTER — Telehealth: Payer: Self-pay | Admitting: *Deleted

## 2012-12-11 ENCOUNTER — Ambulatory Visit (HOSPITAL_BASED_OUTPATIENT_CLINIC_OR_DEPARTMENT_OTHER): Payer: Medicare Other | Admitting: Internal Medicine

## 2012-12-11 ENCOUNTER — Telehealth: Payer: Self-pay | Admitting: Internal Medicine

## 2012-12-11 ENCOUNTER — Ambulatory Visit: Payer: Medicare Other

## 2012-12-11 ENCOUNTER — Encounter: Payer: Self-pay | Admitting: Internal Medicine

## 2012-12-11 VITALS — BP 138/80 | HR 108 | Temp 97.9°F | Resp 18 | Ht 62.0 in | Wt 173.7 lb

## 2012-12-11 DIAGNOSIS — C3430 Malignant neoplasm of lower lobe, unspecified bronchus or lung: Secondary | ICD-10-CM

## 2012-12-11 DIAGNOSIS — C342 Malignant neoplasm of middle lobe, bronchus or lung: Secondary | ICD-10-CM

## 2012-12-11 DIAGNOSIS — C3490 Malignant neoplasm of unspecified part of unspecified bronchus or lung: Secondary | ICD-10-CM

## 2012-12-11 DIAGNOSIS — C7931 Secondary malignant neoplasm of brain: Secondary | ICD-10-CM

## 2012-12-11 MED ORDER — CYANOCOBALAMIN 1000 MCG/ML IJ SOLN
1000.0000 ug | Freq: Once | INTRAMUSCULAR | Status: AC
  Administered 2012-12-11: 1000 ug via INTRAMUSCULAR

## 2012-12-11 NOTE — Telephone Encounter (Signed)
Per staff message and POF I have scheduled appts.  JMW  

## 2012-12-11 NOTE — Telephone Encounter (Signed)
Talked to pt and she has appt calendar for MArch 2014

## 2012-12-11 NOTE — Progress Notes (Signed)
[facility] [phone]   [phone]  OFFICE PROGRESS NOTE  Ernestine Conrad, MD [facility] [address] [place] [address]  DIAGNOSIS: Metastatic non-small cell lung cancer initially diagnosed as synchronous primary non-small cell lung cancer, adenocarcinoma with negative EGFR mutation and negative ALK gene translocation involving the right middle lobe and lower lobe diagnosed in July of 2013.   PRIOR THERAPY:  1) Stereotactic radiotherapy to the right lung lesions under the care of Dr. Mitzi Hansen completed on 07/09/2012.  2) whole brain irradiation under the care of Dr. Mitzi Hansen completed on 12/04/2012  CURRENT THERAPY: The patient will start next week the first cycle of systemic chemotherapy with carboplatin for AUC of 5 and Alimta 500 mg/M2 every 3 weeks.   INTERVAL HISTORY: [name] 62 y.o. female returns to the clinic today for followup visit. The patient is feeling fine today with no specific complaints. She tolerated the whole brain irradiation fairly well except for mild fatigue. The patient denied having any significant chest pain, shortness breath, cough or hemoptysis. She has no significant weight loss or night sweats. She is here today for evaluation and discussion of her treatment options.  MEDICAL HISTORY: Past Medical History  Diagnosis Date  . Atrial fibrillation   . Coronary atherosclerosis of native coronary artery     Nonobstructive, LVEF 60%  . COPD (chronic obstructive pulmonary disease)   . Mixed hyperlipidemia   . Essential hypertension, benign   . Tachycardia-bradycardia syndrome   . Embolism - blood clot   . SOB (shortness of breath) on exertion   . Allergy     Latex  . Asthma   . Anxiety   . Myocardial infarction     12 years ago ?  [name] History of radiation therapy     59 gy fraction 5 eot 07/09/12 right lung  . Lung cancer 04/29/12 dx    RLL needle bx=invasive well diff adenocarcinoma  [facility]     ALLERGIES:  is allergic to latex.  MEDICATIONS:  Current Outpatient Prescriptions  Medication Sig Dispense Refill  . albuterol (PROAIR HFA) 108 (90 BASE) MCG/ACT inhaler Inhale 2 puffs into the lungs every 6 (six) hours as needed for wheezing or shortness of breath.      [name] albuterol (PROVENTIL) (2.5 MG/3ML) 0.083% nebulizer solution Take 2.5 mg by nebulization every 6 (six) hours as needed for wheezing or shortness of breath.       [name] aspirin EC 81 MG tablet Take 81 mg by mouth daily.      [name] dexamethasone (DECADRON) 4 MG tablet Take 1 tablet (4 mg total) by mouth as directed. one 3 times daily for 1 week,  One twice daily for 2 weeks, then 1/2 twice daily for 2 weeks, then 1/2 once daily for 2 weeks, then stop.  120 tablet  1  . flecainide (TAMBOCOR) 100 MG tablet Take 1 tablet (100 mg total) by mouth 2 (two) times daily.  60 tablet  5  . hyoscyamine (LEVSIN/SL) 0.125 MG SL tablet Place 1 tablet (0.125 mg total) under the tongue every 4 (four) hours as needed for cramping.  30 tablet  0  . ipratropium (ATROVENT) 0.02 % nebulizer solution Take 500 mcg by nebulization 4 (four) times daily as needed for wheezing (shortness of breath).       . meclizine (ANTIVERT) 25 MG tablet       . MELATONIN ER PO Take 5 mg by mouth at  bedtime.      . nebivolol (BYSTOLIC) 10 MG tablet Take 10 mg by mouth daily.       [name] oxyCODONE-acetaminophen (PERCOCET) 10-325 MG per tablet Take 1 tablet by mouth every 8 (eight) hours as needed. Takes once daily  And prn for knees  30 tablet  0  . pantoprazole (PROTONIX) 40 MG tablet Take 1 tablet (40 mg total) by mouth daily.  30 tablet  2  . potassium chloride SA (KLOR-CON M20) 20 MEQ tablet Take 1 tablet (20 mEq total) by mouth daily.  30 tablet  5  . rosuvastatin (CRESTOR) 40 MG tablet Take 1 tablet (40 mg total) by mouth daily.  30 tablet  11  . TRIBENZOR 40-5-25 MG TABS take 1 tablet once daily  30 tablet  5  . warfarin (COUMADIN) 5 MG tablet 5mg  daily except 2.5mg   on Tuesdays, Thursdays and Saturdays or as directed by anticoagulation clinic  45 tablet  2   No current facility-administered medications for this visit.    SURGICAL HISTORY:  Past Surgical History  Procedure Laterality Date  . Appendectomy    . Insert / replace / remove pacemaker  3/11     [employer] - Dr. [name]  . Video bronchoscopy  02/22/2012    Procedure: VIDEO BRONCHOSCOPY WITH FLUORO;  Surgeon: Nyoka Cowden, MD;  Location: [facility];  Service: Cardiopulmonary;  Laterality: Bilateral;  . Lung biopsy  04/29/12    RLL invasive well diff adenocarcinoma  . Lung biopsy  02/22/12    RML=benign lung parenchyma,no tumor sen  . Tonsillectomy and adenoidectomy      age 43  . Total abdominal hysterectomy  1975    1/2 right ovary intact, left salpingo-oppherctomy  . Mr brain w wo contrast  11/17/2012         REVIEW OF SYSTEMS:  A comprehensive review of systems was negative except for: Constitutional: positive for fatigue   PHYSICAL EXAMINATION: General appearance: alert, cooperative and no distress Head: Normocephalic, without obvious abnormality, atraumatic Neck: no adenopathy Lymph nodes: Cervical, supraclavicular, and axillary nodes normal. Resp: clear to auscultation bilaterally Cardio: regular rate and rhythm, S1, S2 normal, no murmur, click, rub or gallop GI: soft, non-tender; bowel sounds normal; no masses,  no organomegaly Extremities: extremities normal, atraumatic, no cyanosis or edema Neurologic: Alert and oriented X 3, normal strength and tone. Normal symmetric reflexes. Normal coordination and gait  ECOG PERFORMANCE STATUS: 1 - Symptomatic but completely ambulatory  Blood pressure 138/80, pulse 108, temperature 97.9 F (36.6 C), temperature source Oral, resp. rate 18, height 5\' 2"  (1.575 m), weight 173 lb 11.2 oz (78.79 kg).  LABORATORY DATA: Lab Results  Component Value Date   WBC 20.3* 11/19/2012   HGB 12.4 11/19/2012   HCT 37.3 11/19/2012   MCV 95.6 11/19/2012    PLT 178 11/19/2012      Chemistry      Component Value Date/Time   NA 137 11/19/2012 0350   NA 141 11/12/2012 1433   K 4.0 11/19/2012 0350   K 3.3* 11/12/2012 1433   CL 104 11/19/2012 0350   CL 103 11/12/2012 1433   CO2 26 11/19/2012 0350   CO2 26 11/12/2012 1433   BUN 22 11/19/2012 0350   BUN 15.3 11/12/2012 1433   CREATININE 0.70 11/19/2012 0350   CREATININE 0.8 11/12/2012 1433   CREATININE 0.80 09/24/2011 1058      Component Value Date/Time   CALCIUM 8.2* 11/19/2012 0350   CALCIUM 9.4 11/12/2012 1433  ALKPHOS 47 11/16/2012 1603   ALKPHOS 63 11/12/2012 1433   AST 19 11/16/2012 1603   AST 21 11/12/2012 1433   ALT 18 11/16/2012 1603   ALT 18 11/12/2012 1433   BILITOT 0.4 11/16/2012 1603   BILITOT 0.42 11/12/2012 1433       RADIOGRAPHIC STUDIES: Ct Head Wo Contrast  11/16/2012  *RADIOLOGY REPORT*  Clinical Data: Vomiting, dizziness  CT HEAD WITHOUT CONTRAST  Technique:  Contiguous axial images were obtained from the base of the skull through the vertex without contrast.  Comparison: CT brain scan of 05/21/2012  Findings: There is a large rounded lesion within the right cerebellum displacing the midline to the left and slightly impinging upon the fourth ventricle.  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  There is a question of a second lesion high in the right cerebellum or possibly in the right posterior parietal lobe measuring 2.5 x 2.0 cm.  No acute hemorrhage is seen.  The ventricular system is stable in size and configuration and the septum is in a midline position.  MRI is recommended for more sensitive assessment of possible additional metastatic lesions.  Mild small vessel ischemic change is present in the periventricular white matter.  No calvarial abnormality is seen.  IMPRESSION:  1.  Rounded peripherally enhancing lesion in the right cerebellar hemisphere with questionable second lesion possibly the right posterior parietal  lobe suspicious for metastatic lesions.  MRI of the brain is recommended. 2.  Mild small vessel ischemic change.  I discussed the findings of this study with Dr. Estell Harpin on 11/16/2012 at 4:50 p.m.   Original Report Authenticated By: Dwyane Dee, M.D.     ASSESSMENT: This is a very pleasant 62 years old white female with metastatic non-small cell lung cancer, adenocarcinoma with recent brain metastasis status post whole brain irradiation.  PLAN: I have a lengthy discussion with the patient today about her current condition and treatment options. I recommended for her systemic chemotherapy with carboplatin for AUC of 5 and Alimta 500 mg/M2 every 3 weeks. I discussed with the patient adverse effect of this treatment including but not limited to alopecia, suppression, nausea and vomiting, peripheral neuropathy, liver or renal dysfunction. The patient would like to proceed with treatment as planned. I will arrange for her to have a chemotherapy education class before starting the first cycle of her treatment. The patient will receive vitamin B12 injection today. I will call her pharmacy was prescription for folic acid 1 mg by mouth daily in addition to Decadron 4 mg by mouth twice a day day before, day of and day after the chemotherapy as well as Compazine 10 mg by mouth every 6 hours as needed for nausea. She is expected to start the first cycle of this treatment next week. She would come back for followup visit in 2 weeks for evaluation and management any adverse effect of her chemotherapy.  All questions were answered. The patient knows to call the clinic with any problems, questions or concerns. We can certainly see the patient much sooner if necessary.  I spent 15 minutes counseling the patient face to face. The total time spent in the appointment was 25 minutes.

## 2012-12-12 NOTE — Progress Notes (Signed)
  Radiation Oncology         [phone] ________________________________  Name: [name] MRN: [id number]  Date: 12/04/2012  DOB: [birth year]  End of Treatment Note  Diagnosis:   Metastatic non-small cell lung cancer     Indication for treatment:  Palliative       Radiation treatment dates:   11/20/2012 through 12/04/2012  Site/dose:   The patient was treated to a course of whole brain radiotherapy using left and right whole brain radiotherapy fields. She received a total of 30 gray in 10 fractions at 3 gray per fraction.  Narrative: The patient tolerated radiation treatment relatively well.   The patient did not have any major difficulties with acute toxicity.  Plan: The patient has completed radiation treatment. The patient will return to radiation oncology clinic for routine followup in one month. I advised the patient to call or return sooner if they have any questions or concerns related to their recovery or treatment. ________________________________  Radene Gunning, M.D., Ph.D.

## 2012-12-13 NOTE — Patient Instructions (Signed)
We discussed treatment options including systemic chemotherapy with carboplatin and Alimta. First cycle expected next week. Followup in 2 weeks.

## 2012-12-15 ENCOUNTER — Telehealth: Payer: Self-pay | Admitting: *Deleted

## 2012-12-15 ENCOUNTER — Ambulatory Visit (INDEPENDENT_AMBULATORY_CARE_PROVIDER_SITE_OTHER): Payer: Medicare Other | Admitting: *Deleted

## 2012-12-15 DIAGNOSIS — Z7901 Long term (current) use of anticoagulants: Secondary | ICD-10-CM

## 2012-12-15 DIAGNOSIS — I4891 Unspecified atrial fibrillation: Secondary | ICD-10-CM

## 2012-12-15 NOTE — Telephone Encounter (Signed)
See coumadin note. 

## 2012-12-16 ENCOUNTER — Other Ambulatory Visit: Payer: Medicare Other

## 2012-12-16 ENCOUNTER — Other Ambulatory Visit: Payer: Self-pay | Admitting: Medical Oncology

## 2012-12-16 ENCOUNTER — Encounter: Payer: Self-pay | Admitting: *Deleted

## 2012-12-16 ENCOUNTER — Telehealth: Payer: Self-pay | Admitting: *Deleted

## 2012-12-16 MED ORDER — PROCHLORPERAZINE MALEATE 10 MG PO TABS: 10.0000 mg | ORAL_TABLET | Freq: Four times a day (QID) | ORAL | Status: DC | PRN

## 2012-12-16 MED ORDER — DEXAMETHASONE 4 MG PO TABS: 4.0000 mg | ORAL_TABLET | Freq: Two times a day (BID) | ORAL | Status: DC

## 2012-12-16 MED ORDER — FOLIC ACID 1 MG PO TABS: 1.0000 mg | ORAL_TABLET | Freq: Every day | ORAL | Status: DC

## 2012-12-16 NOTE — Telephone Encounter (Signed)
No additional notes

## 2012-12-16 NOTE — Telephone Encounter (Signed)
Called in antiemetics per Dr Arbutus Ped.He instructed her in decadron dosing

## 2012-12-18 ENCOUNTER — Other Ambulatory Visit: Payer: Self-pay | Admitting: Internal Medicine

## 2012-12-18 ENCOUNTER — Other Ambulatory Visit: Payer: Self-pay | Admitting: Physician Assistant

## 2012-12-18 ENCOUNTER — Encounter: Payer: Self-pay | Admitting: Physician Assistant

## 2012-12-18 ENCOUNTER — Ambulatory Visit (HOSPITAL_BASED_OUTPATIENT_CLINIC_OR_DEPARTMENT_OTHER): Payer: Medicare Other

## 2012-12-18 ENCOUNTER — Other Ambulatory Visit: Payer: Self-pay

## 2012-12-18 ENCOUNTER — Other Ambulatory Visit: Payer: Self-pay | Admitting: *Deleted

## 2012-12-18 ENCOUNTER — Inpatient Hospital Stay (HOSPITAL_COMMUNITY)
Admission: EM | Admit: 2012-12-18 | Discharge: 2012-12-23 | DRG: 638 | Disposition: A | Discharge status: Home Health | Payer: Medicare Other | Attending: Internal Medicine | Admitting: Internal Medicine

## 2012-12-18 ENCOUNTER — Encounter (HOSPITAL_COMMUNITY): Payer: Self-pay | Admitting: Emergency Medicine

## 2012-12-18 ENCOUNTER — Emergency Department (HOSPITAL_COMMUNITY): Payer: Medicare Other

## 2012-12-18 ENCOUNTER — Other Ambulatory Visit (HOSPITAL_BASED_OUTPATIENT_CLINIC_OR_DEPARTMENT_OTHER): Payer: Medicare Other | Admitting: Lab

## 2012-12-18 VITALS — BP 97/56 | HR 62 | Temp 98.2°F | Resp 18

## 2012-12-18 DIAGNOSIS — K589 Irritable bowel syndrome without diarrhea: Secondary | ICD-10-CM

## 2012-12-18 DIAGNOSIS — R270 Ataxia, unspecified: Secondary | ICD-10-CM

## 2012-12-18 DIAGNOSIS — R7309 Other abnormal glucose: Secondary | ICD-10-CM

## 2012-12-18 DIAGNOSIS — C801 Malignant (primary) neoplasm, unspecified: Secondary | ICD-10-CM | POA: Diagnosis present

## 2012-12-18 DIAGNOSIS — E785 Hyperlipidemia, unspecified: Secondary | ICD-10-CM

## 2012-12-18 DIAGNOSIS — M25519 Pain in unspecified shoulder: Secondary | ICD-10-CM

## 2012-12-18 DIAGNOSIS — I10 Essential (primary) hypertension: Secondary | ICD-10-CM

## 2012-12-18 DIAGNOSIS — E782 Mixed hyperlipidemia: Secondary | ICD-10-CM | POA: Diagnosis present

## 2012-12-18 DIAGNOSIS — J441 Chronic obstructive pulmonary disease with (acute) exacerbation: Secondary | ICD-10-CM

## 2012-12-18 DIAGNOSIS — C3490 Malignant neoplasm of unspecified part of unspecified bronchus or lung: Secondary | ICD-10-CM

## 2012-12-18 DIAGNOSIS — C342 Malignant neoplasm of middle lobe, bronchus or lung: Secondary | ICD-10-CM

## 2012-12-18 DIAGNOSIS — E162 Hypoglycemia, unspecified: Secondary | ICD-10-CM

## 2012-12-18 DIAGNOSIS — E663 Overweight: Secondary | ICD-10-CM

## 2012-12-18 DIAGNOSIS — E1110 Type 2 diabetes mellitus with ketoacidosis without coma: Secondary | ICD-10-CM

## 2012-12-18 DIAGNOSIS — R279 Unspecified lack of coordination: Secondary | ICD-10-CM | POA: Diagnosis present

## 2012-12-18 DIAGNOSIS — D696 Thrombocytopenia, unspecified: Secondary | ICD-10-CM

## 2012-12-18 DIAGNOSIS — Z950 Presence of cardiac pacemaker: Secondary | ICD-10-CM

## 2012-12-18 DIAGNOSIS — R739 Hyperglycemia, unspecified: Secondary | ICD-10-CM

## 2012-12-18 DIAGNOSIS — F17200 Nicotine dependence, unspecified, uncomplicated: Secondary | ICD-10-CM

## 2012-12-18 DIAGNOSIS — C7931 Secondary malignant neoplasm of brain: Secondary | ICD-10-CM

## 2012-12-18 DIAGNOSIS — Z79899 Other long term (current) drug therapy: Secondary | ICD-10-CM

## 2012-12-18 DIAGNOSIS — Z923 Personal history of irradiation: Secondary | ICD-10-CM

## 2012-12-18 DIAGNOSIS — I252 Old myocardial infarction: Secondary | ICD-10-CM

## 2012-12-18 DIAGNOSIS — F411 Generalized anxiety disorder: Secondary | ICD-10-CM | POA: Diagnosis present

## 2012-12-18 DIAGNOSIS — E875 Hyperkalemia: Secondary | ICD-10-CM

## 2012-12-18 DIAGNOSIS — R0602 Shortness of breath: Secondary | ICD-10-CM

## 2012-12-18 DIAGNOSIS — I2510 Atherosclerotic heart disease of native coronary artery without angina pectoris: Secondary | ICD-10-CM

## 2012-12-18 DIAGNOSIS — D72829 Elevated white blood cell count, unspecified: Secondary | ICD-10-CM

## 2012-12-18 DIAGNOSIS — I495 Sick sinus syndrome: Secondary | ICD-10-CM

## 2012-12-18 DIAGNOSIS — T380X5A Adverse effect of glucocorticoids and synthetic analogues, initial encounter: Secondary | ICD-10-CM

## 2012-12-18 DIAGNOSIS — Z87891 Personal history of nicotine dependence: Secondary | ICD-10-CM

## 2012-12-18 DIAGNOSIS — Z7901 Long term (current) use of anticoagulants: Secondary | ICD-10-CM

## 2012-12-18 DIAGNOSIS — J449 Chronic obstructive pulmonary disease, unspecified: Secondary | ICD-10-CM

## 2012-12-18 DIAGNOSIS — R918 Other nonspecific abnormal finding of lung field: Secondary | ICD-10-CM

## 2012-12-18 DIAGNOSIS — E1010 Type 1 diabetes mellitus with ketoacidosis without coma: Principal | ICD-10-CM | POA: Diagnosis present

## 2012-12-18 DIAGNOSIS — Z5111 Encounter for antineoplastic chemotherapy: Secondary | ICD-10-CM

## 2012-12-18 DIAGNOSIS — I4891 Unspecified atrial fibrillation: Secondary | ICD-10-CM

## 2012-12-18 DIAGNOSIS — C3430 Malignant neoplasm of lower lobe, unspecified bronchus or lung: Secondary | ICD-10-CM

## 2012-12-18 DIAGNOSIS — Z86711 Personal history of pulmonary embolism: Secondary | ICD-10-CM

## 2012-12-18 DIAGNOSIS — E871 Hypo-osmolality and hyponatremia: Secondary | ICD-10-CM

## 2012-12-18 DIAGNOSIS — D649 Anemia, unspecified: Secondary | ICD-10-CM | POA: Diagnosis present

## 2012-12-18 HISTORY — DX: Unspecified adrenocortical insufficiency: E27.40

## 2012-12-18 LAB — BASIC METABOLIC PANEL
BUN: 41 mg/dL — ABNORMAL HIGH (ref 6–23)
BUN: 38 mg/dL — ABNORMAL HIGH (ref 6–23)
CO2: 22 mEq/L (ref 19–32)
CO2: 24 mEq/L (ref 19–32)
Calcium: 7.9 mg/dL — ABNORMAL LOW (ref 8.4–10.5)
Calcium: 8 mg/dL — ABNORMAL LOW (ref 8.4–10.5)
Chloride: 93 mEq/L — ABNORMAL LOW (ref 96–112)
Chloride: 97 mEq/L (ref 96–112)
Chloride: 97 mEq/L (ref 96–112)
Creatinine, Ser: 0.64 mg/dL (ref 0.50–1.10)
GFR calc non Af Amer: >90 mL/min (ref >90)
Glucose, Bld: 545 mg/dL — ABNORMAL HIGH (ref 70–99)
Glucose, Bld: 345 mg/dL — ABNORMAL HIGH (ref 70–99)
Potassium: 4.2 mEq/L (ref 3.5–5.1)
Potassium: 4 mEq/L (ref 3.5–5.1)
Sodium: 125 mEq/L — ABNORMAL LOW (ref 135–145)
Sodium: 131 mEq/L — ABNORMAL LOW (ref 135–145)

## 2012-12-18 LAB — BASIC METABOLIC PANEL (CC13)
BUN: 43.6 mg/dL — ABNORMAL HIGH (ref 7.0–26.0)
CO2: 23 mEq/L (ref 22–29)
Calcium: 8.4 mg/dL (ref 8.4–10.4)
Glucose: 642 mg/dl (ref 70–99)
Sodium: 126 mEq/L — ABNORMAL LOW (ref 136–145)

## 2012-12-18 LAB — CBC WITH DIFFERENTIAL/PLATELET
BASO%: 0.1 % (ref 0.0–2.0)
HCT: 41.2 % (ref 34.8–46.6)
LYMPH%: 3.4 % — ABNORMAL LOW (ref 14.0–49.7)
MCH: 31.6 pg (ref 25.1–34.0)
MCHC: 35 g/dL (ref 31.5–36.0)
MONO#: 0.6 10*3/uL (ref 0.1–0.9)
NEUT%: 93.9 % — ABNORMAL HIGH (ref 38.4–76.8)
Platelets: 182 10*3/uL (ref 145–400)
WBC: 21.4 10*3/uL — ABNORMAL HIGH (ref 3.9–10.3)

## 2012-12-18 LAB — PROTIME-INR
INR: 2.62 — ABNORMAL HIGH (ref 0.00–1.49)
Prothrombin Time: 26.7 seconds — ABNORMAL HIGH (ref 11.6–15.2)

## 2012-12-18 LAB — CBC
HCT: 35 % — ABNORMAL LOW (ref 36.0–46.0)
HCT: 37.1 % (ref 36.0–46.0)
Hemoglobin: 12.6 g/dL (ref 12.0–15.0)
MCH: 32.1 pg (ref 26.0–34.0)
MCH: 31.8 pg (ref 26.0–34.0)
MCHC: 36 g/dL (ref 30.0–36.0)
MCV: 89.4 fL (ref 78.0–100.0)
Platelets: 132 10*3/uL — ABNORMAL LOW (ref 150–400)
RBC: 4.15 MIL/uL (ref 3.87–5.11)
RDW: 13.9 % (ref 11.5–15.5)

## 2012-12-18 LAB — COMPREHENSIVE METABOLIC PANEL (CC13)
ALT: 64 U/L — ABNORMAL HIGH (ref 0–55)
Albumin: 3 g/dL — ABNORMAL LOW (ref 3.5–5.0)
Alkaline Phosphatase: 154 U/L — ABNORMAL HIGH (ref 40–150)
BUN: 43 mg/dL — ABNORMAL HIGH (ref 7.0–26.0)
Chloride: 93 mEq/L — ABNORMAL LOW (ref 98–107)
Creatinine: 1.2 mg/dL — ABNORMAL HIGH (ref 0.6–1.1)
Total Bilirubin: 0.86 mg/dL (ref 0.20–1.20)
Total Protein: 5.6 g/dL — ABNORMAL LOW (ref 6.4–8.3)

## 2012-12-18 LAB — URINALYSIS, ROUTINE W REFLEX MICROSCOPIC
Glucose, UA: >1000 mg/dL — AB
Leukocytes, UA: NEGATIVE
Protein, ur: NEGATIVE mg/dL
Specific Gravity, Urine: 1.035 — ABNORMAL HIGH (ref 1.005–1.030)
Urobilinogen, UA: 0.2 mg/dL (ref 0.0–1.0)

## 2012-12-18 LAB — URINE MICROSCOPIC-ADD ON

## 2012-12-18 LAB — MRSA PCR SCREENING: MRSA by PCR: NEGATIVE

## 2012-12-18 LAB — GLUCOSE (CC13): Glucose: 658 mg/dl (ref 70–99)

## 2012-12-18 LAB — WHOLE BLOOD GLUCOSE
Glucose: 580 mg/dL (ref 70–100)
HRS PC: 0.5 Hours

## 2012-12-18 MED ORDER — DEXTROSE-NACL 5-0.45 % IV SOLN
INTRAVENOUS | Status: DC
  Administered 2012-12-19: 75 mL/h via INTRAVENOUS

## 2012-12-18 MED ORDER — INSULIN REGULAR HUMAN 100 UNIT/ML IJ SOLN: 10.0000 [IU] | Freq: Once | INTRAMUSCULAR | Status: DC

## 2012-12-18 MED ORDER — ALBUTEROL SULFATE HFA 108 (90 BASE) MCG/ACT IN AERS: 2.0000 | INHALATION_SPRAY | Freq: Four times a day (QID) | RESPIRATORY_TRACT | Status: DC | PRN

## 2012-12-18 MED ORDER — ALBUTEROL SULFATE (5 MG/ML) 0.5% IN NEBU: 2.5000 mg | INHALATION_SOLUTION | RESPIRATORY_TRACT | Status: DC | PRN

## 2012-12-18 MED ORDER — POTASSIUM CHLORIDE CRYS ER 20 MEQ PO TBCR
20.0000 meq | EXTENDED_RELEASE_TABLET | Freq: Every day | ORAL | Status: DC
  Filled 2012-12-18: qty 1

## 2012-12-18 MED ORDER — PANTOPRAZOLE SODIUM 40 MG PO TBEC
40.0000 mg | DELAYED_RELEASE_TABLET | Freq: Every day | ORAL | Status: DC
  Administered 2012-12-19 – 2012-12-23 (×5): 40 mg via ORAL
  Filled 2012-12-18 (×5): qty 1

## 2012-12-18 MED ORDER — DEXAMETHASONE 4 MG PO TABS: 4.0000 mg | ORAL_TABLET | Freq: Two times a day (BID) | ORAL | Status: DC

## 2012-12-18 MED ORDER — SODIUM CHLORIDE 0.9 % IV SOLN
500.0000 mg/m2 | Freq: Once | INTRAVENOUS | Status: AC
  Administered 2012-12-18: 925 mg via INTRAVENOUS
  Filled 2012-12-18: qty 37

## 2012-12-18 MED ORDER — OXYCODONE-ACETAMINOPHEN 5-325 MG PO TABS: 1.0000 | ORAL_TABLET | Freq: Three times a day (TID) | ORAL | Status: DC | PRN

## 2012-12-18 MED ORDER — SODIUM CHLORIDE 0.9 % IV SOLN: INTRAVENOUS | Status: DC

## 2012-12-18 MED ORDER — DEXTROSE 50 % IV SOLN: 25.0000 mL | INTRAVENOUS | Status: DC | PRN

## 2012-12-18 MED ORDER — WARFARIN SODIUM 2.5 MG PO TABS
2.5000 mg | ORAL_TABLET | Freq: Once | ORAL | Status: AC
  Administered 2012-12-18: 2.5 mg via ORAL
  Filled 2012-12-18: qty 1

## 2012-12-18 MED ORDER — IRBESARTAN 300 MG PO TABS
300.0000 mg | ORAL_TABLET | Freq: Every day | ORAL | Status: DC
  Administered 2012-12-19 – 2012-12-23 (×5): 300 mg via ORAL
  Filled 2012-12-18 (×5): qty 1

## 2012-12-18 MED ORDER — MECLIZINE HCL 25 MG PO TABS
25.0000 mg | ORAL_TABLET | Freq: Three times a day (TID) | ORAL | Status: DC | PRN
  Filled 2012-12-18: qty 1

## 2012-12-18 MED ORDER — INSULIN REGULAR HUMAN 100 UNIT/ML IJ SOLN
20.0000 [IU] | Freq: Once | INTRAMUSCULAR | Status: DC
  Filled 2012-12-18: qty 0.2

## 2012-12-18 MED ORDER — ATORVASTATIN CALCIUM 80 MG PO TABS
80.0000 mg | ORAL_TABLET | Freq: Every day | ORAL | Status: DC
  Administered 2012-12-18 – 2012-12-22 (×5): 80 mg via ORAL
  Filled 2012-12-18 (×6): qty 1

## 2012-12-18 MED ORDER — SODIUM CHLORIDE 0.9 % IV SOLN
INTRAVENOUS | Status: DC
  Administered 2012-12-18: 5.7 [IU]/h via INTRAVENOUS
  Administered 2012-12-19: 4.1 [IU]/h via INTRAVENOUS
  Administered 2012-12-19: 3.5 [IU]/h via INTRAVENOUS
  Administered 2012-12-19: 4.5 [IU]/h via INTRAVENOUS
  Filled 2012-12-18: qty 1

## 2012-12-18 MED ORDER — FLECAINIDE ACETATE 100 MG PO TABS
100.0000 mg | ORAL_TABLET | Freq: Two times a day (BID) | ORAL | Status: DC
  Administered 2012-12-18 – 2012-12-23 (×10): 100 mg via ORAL
  Filled 2012-12-18 (×11): qty 1

## 2012-12-18 MED ORDER — SODIUM CHLORIDE 0.9 % IV BOLUS (SEPSIS)
1000.0000 mL | Freq: Once | INTRAVENOUS | Status: AC
  Administered 2012-12-18: 1000 mL via INTRAVENOUS

## 2012-12-18 MED ORDER — SODIUM CHLORIDE 0.9 % IV SOLN
430.0000 mg | Freq: Once | INTRAVENOUS | Status: AC
  Administered 2012-12-18: 430 mg via INTRAVENOUS
  Filled 2012-12-18: qty 43

## 2012-12-18 MED ORDER — ATORVASTATIN CALCIUM 80 MG PO TABS: 80.0000 mg | ORAL_TABLET | Freq: Every day | ORAL | Status: DC

## 2012-12-18 MED ORDER — FOLIC ACID 1 MG PO TABS
1.0000 mg | ORAL_TABLET | Freq: Every day | ORAL | Status: DC
  Administered 2012-12-19 – 2012-12-23 (×5): 1 mg via ORAL
  Filled 2012-12-18 (×5): qty 1

## 2012-12-18 MED ORDER — ASPIRIN EC 81 MG PO TBEC
81.0000 mg | DELAYED_RELEASE_TABLET | Freq: Every day | ORAL | Status: DC
  Administered 2012-12-19 – 2012-12-23 (×5): 81 mg via ORAL
  Filled 2012-12-18 (×5): qty 1

## 2012-12-18 MED ORDER — SODIUM CHLORIDE 0.9 % IV SOLN
Freq: Once | INTRAVENOUS | Status: AC
  Administered 2012-12-18: 12:00:00 via INTRAVENOUS

## 2012-12-18 MED ORDER — POTASSIUM CHLORIDE 10 MEQ/100ML IV SOLN
10.0000 meq | INTRAVENOUS | Status: AC
Start: 2012-12-18 — End: 2012-12-19
  Administered 2012-12-18 – 2012-12-19 (×4): 10 meq via INTRAVENOUS
  Filled 2012-12-18: qty 400

## 2012-12-18 MED ORDER — DEXAMETHASONE SODIUM PHOSPHATE 4 MG/ML IJ SOLN
20.0000 mg | Freq: Once | INTRAMUSCULAR | Status: AC
  Administered 2012-12-18: 20 mg via INTRAVENOUS

## 2012-12-18 MED ORDER — IPRATROPIUM BROMIDE 0.02 % IN SOLN: 500.0000 ug | Freq: Four times a day (QID) | RESPIRATORY_TRACT | Status: DC | PRN

## 2012-12-18 MED ORDER — NEBIVOLOL HCL 10 MG PO TABS
10.0000 mg | ORAL_TABLET | Freq: Every day | ORAL | Status: DC
  Administered 2012-12-19 – 2012-12-23 (×5): 10 mg via ORAL
  Filled 2012-12-18 (×5): qty 1

## 2012-12-18 MED ORDER — OXYCODONE HCL 5 MG PO TABS: 5.0000 mg | ORAL_TABLET | Freq: Three times a day (TID) | ORAL | Status: DC | PRN

## 2012-12-18 MED ORDER — ONDANSETRON 16 MG/50ML IVPB (CHCC)
16.0000 mg | Freq: Once | INTRAVENOUS | Status: AC
  Administered 2012-12-18: 16 mg via INTRAVENOUS

## 2012-12-18 MED ORDER — WARFARIN - PHARMACIST DOSING INPATIENT: Freq: Every day | Status: DC

## 2012-12-18 MED ORDER — SODIUM CHLORIDE 0.9 % IV SOLN
INTRAVENOUS | Status: DC
  Administered 2012-12-18: 3 [IU]/h via INTRAVENOUS
  Filled 2012-12-18: qty 1

## 2012-12-18 MED ORDER — DEXTROSE-NACL 5-0.45 % IV SOLN: INTRAVENOUS | Status: DC

## 2012-12-18 MED ORDER — DEXAMETHASONE 4 MG PO TABS
4.0000 mg | ORAL_TABLET | Freq: Two times a day (BID) | ORAL | Status: DC
  Administered 2012-12-19 – 2012-12-22 (×7): 4 mg via ORAL
  Filled 2012-12-18 (×9): qty 1

## 2012-12-18 MED ORDER — OXYCODONE-ACETAMINOPHEN 10-325 MG PO TABS: 1.0000 | ORAL_TABLET | Freq: Three times a day (TID) | ORAL | Status: DC | PRN

## 2012-12-18 MED ORDER — SODIUM CHLORIDE 0.9 % IV SOLN
INTRAVENOUS | Status: DC
  Administered 2012-12-18: 100 mL/h via INTRAVENOUS
  Administered 2012-12-19: 12:00:00 via INTRAVENOUS
  Administered 2012-12-20: 1000 mL via INTRAVENOUS
  Administered 2012-12-20: 22:00:00 via INTRAVENOUS

## 2012-12-18 MED ORDER — INSULIN REGULAR HUMAN 100 UNIT/ML IJ SOLN
10.0000 [IU] | Freq: Once | INTRAMUSCULAR | Status: AC
  Administered 2012-12-18: 15:00:00 via SUBCUTANEOUS
  Filled 2012-12-18: qty 0.1

## 2012-12-18 MED ORDER — AMLODIPINE BESYLATE 5 MG PO TABS
5.0000 mg | ORAL_TABLET | Freq: Every day | ORAL | Status: DC
  Administered 2012-12-19 – 2012-12-20 (×2): 5 mg via ORAL
  Filled 2012-12-18 (×2): qty 1

## 2012-12-18 MED ORDER — POTASSIUM CHLORIDE 10 MEQ/100ML IV SOLN: 10.0000 meq | INTRAVENOUS | Status: DC

## 2012-12-18 MED ORDER — INSULIN ASPART 100 UNIT/ML ~~LOC~~ SOLN
10.0000 [IU] | Freq: Once | SUBCUTANEOUS | Status: AC
  Administered 2012-12-18: 10 [IU] via SUBCUTANEOUS
  Filled 2012-12-18: qty 1

## 2012-12-18 NOTE — H&P (Addendum)
[facility] History and Physical  [name] BJY:[id number] DOB: 07/30/1951 DOA: 12/18/2012  Referring physician: Dr Senaida Ores. PCP: Ernestine Conrad, MD  Specialists: Dr.Mohammed. Oncologist.  Chief Complaint: Hyperglycemia.  HPI: [name] is a 62 y.o. female with history of metastatic non-small cell lung cancer who was started on chemotherapy today and was placed on Decadron from yesterday was found to be hyperglycemic the sugars around 700. Patient was referred to the ER where in blood work showed sugars to be more than 500 with anion gap and acetone positive. Patient has not had diabetes previously. Presently patient has been started on IV insulin with fluids and has been admitted for diabetic ketoacidosis. Patient otherwise denies any chest pain shortness of breath nausea vomiting diarrhea fever chills.  Review of Systems: As presented in the history of presenting illness nothing else significant.  Past Medical History  Diagnosis Date  . Atrial fibrillation   . Coronary atherosclerosis of native coronary artery     Nonobstructive, LVEF 60%  . COPD (chronic obstructive pulmonary disease)   . Mixed hyperlipidemia   . Essential hypertension, benign   . Tachycardia-bradycardia syndrome   . Embolism - blood clot   . SOB (shortness of breath) on exertion   . Allergy     Latex  . Asthma   . Anxiety   . Myocardial infarction     12 years ago ?  [name] History of radiation therapy     59 gy fraction 5 eot 07/09/12 right lung  . Lung cancer 04/29/12 dx    RLL needle bx=invasive well diff adenocarcinoma  [facility]   Past Surgical History  Procedure Laterality Date  . Appendectomy    . Insert / replace / remove pacemaker  3/11     [employer] Jude - Dr. [name]  . Video bronchoscopy  02/22/2012    Procedure: VIDEO BRONCHOSCOPY WITH FLUORO;  Surgeon: Nyoka Cowden, MD;  Location: [facility];  Service: Cardiopulmonary;  Laterality: Bilateral;  . Lung biopsy  04/29/12    RLL invasive well  diff adenocarcinoma  . Lung biopsy  02/22/12    RML=benign lung parenchyma,no tumor sen  . Tonsillectomy and adenoidectomy      age 61  . Total abdominal hysterectomy  1975    1/2 right ovary intact, left salpingo-oppherctomy  . Mr brain w wo contrast  11/17/2012        Social History:  reports that she quit smoking about 2 months ago. Her smoking use included Cigarettes. She has a 10.75 pack-year smoking history. She has quit using smokeless tobacco. She reports that she does not drink alcohol or use illicit drugs. Lives at home. where does patient live-- Can do ADLs. Can patient participate in ADLs?  Allergies  Allergen Reactions  . Latex     "pulls skin off"    Family History  Problem Relation Age of Onset  . Hypertension Mother   . Emphysema Brother     smoker  . Asthma Father   . Asthma Brother   . Lung cancer Paternal Grandmother     was a smoker  . Cancer Maternal Grandmother     lung, dx early 59's, died 3 days after lobectomy     Prior to Admission medications   Medication Sig Start Date End Date Taking? Authorizing Itha Kroeker  albuterol (PROAIR HFA) 108 (90 BASE) MCG/ACT inhaler Inhale 2 puffs into the lungs every 6 (six) hours as needed for wheezing or shortness of breath.   Yes Historical  Aleathia Purdy, MD  albuterol (PROVENTIL) (2.5 MG/3ML) 0.083% nebulizer solution Take 2.5 mg by nebulization every 6 (six) hours as needed for wheezing or shortness of breath.    Yes Historical Yeray Tomas, MD  aspirin EC 81 MG tablet Take 81 mg by mouth daily.   Yes Historical Larenda Reedy, MD  dexamethasone (DECADRON) 4 MG tablet Take 4 mg by mouth 2 (two) times daily with a meal. 12/16/12  Yes Si Gaul, MD  flecainide (TAMBOCOR) 100 MG tablet Take 1 tablet (100 mg total) by mouth 2 (two) times daily. 08/13/12  Yes Jonelle Sidle, MD  folic acid (FOLVITE) 1 MG tablet Take 1 tablet (1 mg total) by mouth daily. 12/16/12  Yes Si Gaul, MD  ipratropium (ATROVENT) 0.02 % nebulizer  solution Take 500 mcg by nebulization 4 (four) times daily as needed for wheezing (shortness of breath).    Yes Historical Yania Bogie, MD  meclizine (ANTIVERT) 25 MG tablet Take 25 mg by mouth 3 (three) times daily as needed.  11/12/12  Yes Historical Ramisa Duman, MD  MELATONIN ER PO Take 5 mg by mouth at bedtime.   Yes Historical Jesseca Marsch, MD  nebivolol (BYSTOLIC) 10 MG tablet Take 10 mg by mouth daily.  05/19/12  Yes Nyoka Cowden, MD  oxyCODONE-acetaminophen (PERCOCET) 10-325 MG per tablet Take 1 tablet by mouth every 8 (eight) hours as needed. Takes once daily  And prn for knees 11/21/12  Yes Christina P Rama, MD  pantoprazole (PROTONIX) 40 MG tablet Take 1 tablet (40 mg total) by mouth daily. 11/28/12  Yes Oneita Hurt, MD  potassium chloride SA (KLOR-CON M20) 20 MEQ tablet Take 1 tablet (20 mEq total) by mouth daily. 06/30/12  Yes Marinus Maw, MD  rosuvastatin (CRESTOR) 40 MG tablet Take 1 tablet (40 mg total) by mouth daily. 06/10/12  Yes Jodelle Gross, NP  TRIBENZOR 40-5-25 MG TABS take 1 tablet once daily 10/04/12  Yes Nyoka Cowden, MD  warfarin (COUMADIN) 5 MG tablet 5mg  daily except 2.5mg  on Tuesdays, Thursdays and Saturdays or as directed by anticoagulation clinic 10/09/12  Yes Jodelle Gross, NP   Physical Exam: Filed Vitals:   12/18/12 1803 12/18/12 1937 12/18/12 2058  BP: 100/73 118/54 105/41  Pulse: 67 64 61  Temp: 98.4 F (36.9 C)    TempSrc: Oral    Resp: 16 21 20   SpO2: 95% 95% 94%     General:  Well-developed well-nourished.  Eyes: Anicteric no pallor.  ENT: No discharge from the ears eyes nose or mouth.  Neck: No mass felt.  Cardiovascular: S1-S2 heard.  Respiratory: No rhonchi or crepitations.  Abdomen: Soft nontender bowel sounds present.  Skin: No rash seen.  Musculoskeletal: No effusion or edema.  Psychiatric: Appears normal.  Neurologic: Alert awake oriented to time place and person. Moves all extremities.  Labs on Admission:  Basic  Metabolic Panel:  Recent Labs Lab 12/18/12 1023 12/18/12 1355 12/18/12 1643 12/18/12 1840  NA 124* 126*  --  125*  K 5.3* 5.3*  --  4.2  CL 93* 95*  --  93*  CO2 22 23  --  18*  GLUCOSE 710 Repeated and Verified* 642 Repeated and Verified* 658 Repeated and Verified* 545*  BUN 43.0* 43.6*  --  41*  CREATININE 1.2* 1.1  --  0.69  CALCIUM 8.4 8.4  --  8.0*   Liver Function Tests:  Recent Labs Lab 12/18/12 1023  AST 22  ALT 64*  ALKPHOS 154*  BILITOT 0.86  PROT  5.6*  ALBUMIN 3.0*   No results found for this basename: LIPASE, AMYLASE,  in the last 168 hours No results found for this basename: AMMONIA,  in the last 168 hours CBC:  Recent Labs Lab 12/18/12 1023 12/18/12 2115  WBC 21.4* 15.0*  NEUTROABS 20.0*  --   HGB 14.4 12.6  HCT 41.2 35.0*  MCV 90.4 89.1  PLT 182 PENDING   Cardiac Enzymes: No results found for this basename: CKTOTAL, CKMB, CKMBINDEX, TROPONINI,  in the last 168 hours  BNP (last 3 results) No results found for this basename: PROBNP,  in the last 8760 hours CBG:  Recent Labs Lab 12/18/12 1936  GLUCAP 407*    Radiological Exams on Admission: Dg Chest 2 View  12/18/2012  *RADIOLOGY REPORT*  Clinical Data: Cough, weakness, shortness of breath  CHEST - 2 VIEW  Comparison: 04/29/2012  Findings: Left subclavian sequential transvenous pacemaker leads project within the right atrium and right ventricle. Enlargement of cardiac silhouette. Mediastinal contours and pulmonary vascularity normal. Interval decrease in size of nodular density at lower right chest, now 15 x 11 mm, previously 37 x 36 mm. Minimal right basilar atelectasis. Remaining lungs clear. No pleural effusion or pneumothorax. No acute osseous findings.  IMPRESSION: Interval decrease in size of nodular density lower right chest. Enlargement of cardiac silhouette. Minimal right basilar atelectasis.   Original Report Authenticated By: Ulyses Southward, M.D.      Assessment/Plan Principal  Problem:   DKA (diabetic ketoacidoses) Active Problems:   BRADYCARDIA-TACHYCARDIA SYNDROME   COPD (chronic obstructive pulmonary disease)   Ataxia   1. Diabetic ketoacidosis - probably precipitated by patient receiving dexamethasone for chemotherapy. Check hemoglobin A1c see if patient is diabetic. Continue with IV insulin and fluids. Transitioned to long-acting insulin once anion gap is corrected. Patient's insulin requirement may dramatically decrease once patient's Decadron dose decreases.Closely follow CBGs. 2. Hyponatremia - probably from dehydration and hyperglycemia. Since patient also has lung cancer possibilities would include SIADH. Closely follow metabolic panel. Check urine sodium and osmolality. 3. Leukocytosis - reviewing patient's chart patient did have leukocytosis going back to February. Patient is afebrile. Closely follow CBC trends. UA and chest x-ray are unremarkable. 4. A. fib and tachybradycardia syndrome - on Coumadin. Dosing per pharmacy. Continue rate limiting medications. 5. History of PE - Coumadin per pharmacy. 6. COPD - presently not wheezing. Continue inhalers. 7. Hypertension - continue home medications. 8. Hyperlipidemia - continue statins. 9. Metastatic lung cancer - per oncology.    Code Status: Full code.  Family Communication: Family at the bedside.  Disposition Plan: Admit to inpatient.   KAKRAKANDY,ARSHAD N. [facility] Pager [phone].  If 7PM-7AM, please contact night-coverage [email or web address] Password [facility] 12/18/2012, 9:59 PM

## 2012-12-18 NOTE — Progress Notes (Signed)
Infusion nurses made me aware patient's elevated blood glucose as Dr. Arbutus Ped was off site at another clinic. This was initially brought to Dr. Asa Lente attention and he ordered 20 units of regular insulin with blood sugar to be repeated one hour later. The infusion nursing staff were  uncomfortable with giving this amount of insulin. Another stat. Bmet was drawn to verify the blood sugar was obtained and a repeat value was 642. 10 units of regular insulin was administered to the patient with orders for a repeat blood sugar to be drawn one hour afterward. This post insulin glucose level was 658. Of note the patient's history is significant for steroid-induced hyperglycemia however the patient also reports a family history of diabetes mellitus. She received a total of 12 units of dexamethasone as premeds as per instructions, she takes 4 mg of dexamethasone twice daily, the day before, the day of and the day after chemotherapy. She received 20 mg of dexamethasone as premedication for her systemic chemotherapy with carboplatin and Alimta. Her potassium level was also elevated at 5.3. The gravity of these results were explained to the patient as well as her brother and sister-in-law with our recommendations  for her to go to the emergency room for further management. The patient initially did not want to stay for further evaluation however did understand the importance of further evaluation and management of the hyperglycemia and hyperkalemia. Patient was taken to the [facility] emergency department for further evaluation and management of these issues.  Laural Benes, ADRENA E, PA-C

## 2012-12-18 NOTE — Patient Instructions (Addendum)
Pemetrexed injection What is this medicine? PEMETREXED (PEM e TREX ed) is a chemotherapy drug. This medicine affects cells that are rapidly growing, such as cancer cells and cells in your mouth and stomach. It is usually used to treat lung cancers like non-small cell lung cancer and mesothelioma. It may also be used to treat other cancers. This medicine may be used for other purposes; ask your health care provider or pharmacist if you have questions. What should I tell my health care provider before I take this medicine? They need to know if you have any of these conditions: -if you frequently drink alcohol containing beverages -infection (especially a virus infection such as chickenpox, cold sores, or herpes) -kidney disease -liver disease -low blood counts, like low platelets, red bloods, or white blood cells -an unusual or allergic reaction to pemetrexed, mannitol, other medicines, foods, dyes, or preservatives -pregnant or trying to get pregnant -breast-feeding How should I use this medicine? This drug is given as an infusion into a vein. It is administered in a hospital or clinic by a specially trained health care professional. Talk to your pediatrician regarding the use of this medicine in children. Special care may be needed. Overdosage: If you think you have taken too much of this medicine contact a poison control center or emergency room at once. NOTE: This medicine is only for you. Do not share this medicine with others. What if I miss a dose? It is important not to miss your dose. Call your doctor or health care professional if you are unable to keep an appointment. What may interact with this medicine? -aspirin and aspirin-like medicines -medicines to increase blood counts like filgrastim, pegfilgrastim, sargramostim -methotrexate -NSAIDS, medicines for pain and inflammation, like ibuprofen or naproxen -probenecid -pyrimethamine -vaccines Talk to your doctor or health care  professional before taking any of these medicines: -acetaminophen -aspirin -ibuprofen -ketoprofen -naproxen This list may not describe all possible interactions. Give your health care provider a list of all the medicines, herbs, non-prescription drugs, or dietary supplements you use. Also tell them if you smoke, drink alcohol, or use illegal drugs. Some items may interact with your medicine. What should I watch for while using this medicine? Visit your doctor for checks on your progress. This drug may make you feel generally unwell. This is not uncommon, as chemotherapy can affect healthy cells as well as cancer cells. Report any side effects. Continue your course of treatment even though you feel ill unless your doctor tells you to stop. In some cases, you may be given additional medicines to help with side effects. Follow all directions for their use. Call your doctor or health care professional for advice if you get a fever, chills or sore throat, or other symptoms of a cold or flu. Do not treat yourself. This drug decreases your body's ability to fight infections. Try to avoid being around people who are sick. This medicine may increase your risk to bruise or bleed. Call your doctor or health care professional if you notice any unusual bleeding. Be careful brushing and flossing your teeth or using a toothpick because you may get an infection or bleed more easily. If you have any dental work done, tell your dentist you are receiving this medicine. Avoid taking products that contain aspirin, acetaminophen, ibuprofen, naproxen, or ketoprofen unless instructed by your doctor. These medicines may hide a fever. Call your doctor or health care professional if you get diarrhea or mouth sores. Do not treat yourself. To protect your  kidneys, drink water or other fluids as directed while you are taking this medicine. Men and women must use effective birth control while taking this medicine. You may also  need to continue using effective birth control for a time after stopping this medicine. Do not become pregnant while taking this medicine. Tell your doctor right away if you think that you or your partner might be pregnant. There is a potential for serious side effects to an unborn child. Talk to your health care professional or pharmacist for more information. Do not breast-feed an infant while taking this medicine. This medicine may lower sperm counts. What side effects may I notice from receiving this medicine? Side effects that you should report to your doctor or health care professional as soon as possible: -allergic reactions like skin rash, itching or hives, swelling of the face, lips, or tongue -low blood counts - this medicine may decrease the number of white blood cells, red blood cells and platelets. You may be at increased risk for infections and bleeding. -signs of infection - fever or chills, cough, sore throat, pain or difficulty passing urine -signs of decreased platelets or bleeding - bruising, pinpoint red spots on the skin, black, tarry stools, blood in the urine -signs of decreased red blood cells - unusually weak or tired, fainting spells, lightheadedness -breathing problems, like a dry cough -changes in emotions or moods -chest pain -confusion -diarrhea -high blood pressure -mouth or throat sores or ulcers -pain, swelling, warmth in the leg -pain on swallowing -swelling of the ankles, feet, hands -trouble passing urine or change in the amount of urine -vomiting -yellowing of the eyes or skin Side effects that usually do not require medical attention (report to your doctor or health care professional if they continue or are bothersome): -hair loss -loss of appetite -nausea -stomach upset This list may not describe all possible side effects. Call your doctor for medical advice about side effects. You may report side effects to FDA at 1-800-FDA-1088. Where should I keep  my medicine? This drug is given in a hospital or clinic and will not be stored at home. NOTE: This sheet is a summary. It may not cover all possible information. If you have questions about this medicine, talk to your doctor, pharmacist, or health care provider.  2013, [employer]. (04/27/2008 1:24:03 PM) Carboplatin injection What is this medicine? CARBOPLATIN (KAR boe pla tin) is a chemotherapy drug. It targets fast dividing cells, like cancer cells, and causes these cells to die. This medicine is used to treat ovarian cancer and many other cancers. This medicine may be used for other purposes; ask your health care provider or pharmacist if you have questions. What should I tell my health care provider before I take this medicine? They need to know if you have any of these conditions: -blood disorders -hearing problems -kidney disease -recent or ongoing radiation therapy -an unusual or allergic reaction to carboplatin, cisplatin, other chemotherapy, other medicines, foods, dyes, or preservatives -pregnant or trying to get pregnant -breast-feeding How should I use this medicine? This drug is usually given as an infusion into a vein. It is administered in a hospital or clinic by a specially trained health care professional. Talk to your pediatrician regarding the use of this medicine in children. Special care may be needed. Overdosage: If you think you have taken too much of this medicine contact a poison control center or emergency room at once. NOTE: This medicine is only for you. Do not share this  medicine with others. What if I miss a dose? It is important not to miss a dose. Call your doctor or health care professional if you are unable to keep an appointment. What may interact with this medicine? -medicines for seizures -medicines to increase blood counts like filgrastim, pegfilgrastim, sargramostim -some antibiotics like amikacin, gentamicin, neomycin, streptomycin,  tobramycin -vaccines Talk to your doctor or health care professional before taking any of these medicines: -acetaminophen -aspirin -ibuprofen -ketoprofen -naproxen This list may not describe all possible interactions. Give your health care provider a list of all the medicines, herbs, non-prescription drugs, or dietary supplements you use. Also tell them if you smoke, drink alcohol, or use illegal drugs. Some items may interact with your medicine. What should I watch for while using this medicine? Your condition will be monitored carefully while you are receiving this medicine. You will need important blood work done while you are taking this medicine. This drug may make you feel generally unwell. This is not uncommon, as chemotherapy can affect healthy cells as well as cancer cells. Report any side effects. Continue your course of treatment even though you feel ill unless your doctor tells you to stop. In some cases, you may be given additional medicines to help with side effects. Follow all directions for their use. Call your doctor or health care professional for advice if you get a fever, chills or sore throat, or other symptoms of a cold or flu. Do not treat yourself. This drug decreases your body's ability to fight infections. Try to avoid being around people who are sick. This medicine may increase your risk to bruise or bleed. Call your doctor or health care professional if you notice any unusual bleeding. Be careful brushing and flossing your teeth or using a toothpick because you may get an infection or bleed more easily. If you have any dental work done, tell your dentist you are receiving this medicine. Avoid taking products that contain aspirin, acetaminophen, ibuprofen, naproxen, or ketoprofen unless instructed by your doctor. These medicines may hide a fever. Do not become pregnant while taking this medicine. Women should inform their doctor if they wish to become pregnant or think  they might be pregnant. There is a potential for serious side effects to an unborn child. Talk to your health care professional or pharmacist for more information. Do not breast-feed an infant while taking this medicine. What side effects may I notice from receiving this medicine? Side effects that you should report to your doctor or health care professional as soon as possible: -allergic reactions like skin rash, itching or hives, swelling of the face, lips, or tongue -signs of infection - fever or chills, cough, sore throat, pain or difficulty passing urine -signs of decreased platelets or bleeding - bruising, pinpoint red spots on the skin, black, tarry stools, nosebleeds -signs of decreased red blood cells - unusually weak or tired, fainting spells, lightheadedness -breathing problems -changes in hearing -changes in vision -chest pain -high blood pressure -low blood counts - This drug may decrease the number of white blood cells, red blood cells and platelets. You may be at increased risk for infections and bleeding. -nausea and vomiting -pain, swelling, redness or irritation at the injection site -pain, tingling, numbness in the hands or feet -problems with balance, talking, walking -trouble passing urine or change in the amount of urine Side effects that usually do not require medical attention (report to your doctor or health care professional if they continue or are bothersome): -hair  loss -loss of appetite -metallic taste in the mouth or changes in taste This list may not describe all possible side effects. Call your doctor for medical advice about side effects. You may report side effects to FDA at 1-800-FDA-1088. Where should I keep my medicine? This drug is given in a hospital or clinic and will not be stored at home. NOTE: This sheet is a summary. It may not cover all possible information. If you have questions about this medicine, talk to your doctor, pharmacist, or health care  provider.  2012, [employer]. (12/30/2007 2:38:05 PM)Carboplatin injection What is this medicine? CARBOPLATIN (KAR boe pla tin) is a chemotherapy drug. It targets fast dividing cells, like cancer cells, and causes these cells to die. This medicine is used to treat ovarian cancer and many other cancers. This medicine may be used for other purposes; ask your health care provider or pharmacist if you have questions. What should I tell my health care provider before I take this medicine? They need to know if you have any of these conditions: -blood disorders -hearing problems -kidney disease -recent or ongoing radiation therapy -an unusual or allergic reaction to carboplatin, cisplatin, other chemotherapy, other medicines, foods, dyes, or preservatives -pregnant or trying to get pregnant -breast-feeding How should I use this medicine? This drug is usually given as an infusion into a vein. It is administered in a hospital or clinic by a specially trained health care professional. Talk to your pediatrician regarding the use of this medicine in children. Special care may be needed. Overdosage: If you think you have taken too much of this medicine contact a poison control center or emergency room at once. NOTE: This medicine is only for you. Do not share this medicine with others. What if I miss a dose? It is important not to miss a dose. Call your doctor or health care professional if you are unable to keep an appointment. What may interact with this medicine? -medicines for seizures -medicines to increase blood counts like filgrastim, pegfilgrastim, sargramostim -some antibiotics like amikacin, gentamicin, neomycin, streptomycin, tobramycin -vaccines Talk to your doctor or health care professional before taking any of these medicines: -acetaminophen -aspirin -ibuprofen -ketoprofen -naproxen This list may not describe all possible interactions. Give your health care provider a  list of all the medicines, herbs, non-prescription drugs, or dietary supplements you use. Also tell them if you smoke, drink alcohol, or use illegal drugs. Some items may interact with your medicine. What should I watch for while using this medicine? Your condition will be monitored carefully while you are receiving this medicine. You will need important blood work done while you are taking this medicine. This drug may make you feel generally unwell. This is not uncommon, as chemotherapy can affect healthy cells as well as cancer cells. Report any side effects. Continue your course of treatment even though you feel ill unless your doctor tells you to stop. In some cases, you may be given additional medicines to help with side effects. Follow all directions for their use. Call your doctor or health care professional for advice if you get a fever, chills or sore throat, or other symptoms of a cold or flu. Do not treat yourself. This drug decreases your body's ability to fight infections. Try to avoid being around people who are sick. This medicine may increase your risk to bruise or bleed. Call your doctor or health care professional if you notice any unusual bleeding. Be careful brushing and flossing your teeth or  using a toothpick because you may get an infection or bleed more easily. If you have any dental work done, tell your dentist you are receiving this medicine. Avoid taking products that contain aspirin, acetaminophen, ibuprofen, naproxen, or ketoprofen unless instructed by your doctor. These medicines may hide a fever. Do not become pregnant while taking this medicine. Women should inform their doctor if they wish to become pregnant or think they might be pregnant. There is a potential for serious side effects to an unborn child. Talk to your health care professional or pharmacist for more information. Do not breast-feed an infant while taking this medicine. What side effects may I notice from  receiving this medicine? Side effects that you should report to your doctor or health care professional as soon as possible: -allergic reactions like skin rash, itching or hives, swelling of the face, lips, or tongue -signs of infection - fever or chills, cough, sore throat, pain or difficulty passing urine -signs of decreased platelets or bleeding - bruising, pinpoint red spots on the skin, black, tarry stools, nosebleeds -signs of decreased red blood cells - unusually weak or tired, fainting spells, lightheadedness -breathing problems -changes in hearing -changes in vision -chest pain -high blood pressure -low blood counts - This drug may decrease the number of white blood cells, red blood cells and platelets. You may be at increased risk for infections and bleeding. -nausea and vomiting -pain, swelling, redness or irritation at the injection site -pain, tingling, numbness in the hands or feet -problems with balance, talking, walking -trouble passing urine or change in the amount of urine Side effects that usually do not require medical attention (report to your doctor or health care professional if they continue or are bothersome): -hair loss -loss of appetite -metallic taste in the mouth or changes in taste This list may not describe all possible side effects. Call your doctor for medical advice about side effects. You may report side effects to FDA at 1-800-FDA-1088. Where should I keep my medicine? This drug is given in a hospital or clinic and will not be stored at home. NOTE: This sheet is a summary. It may not cover all possible information. If you have questions about this medicine, talk to your doctor, pharmacist, or health care provider.  2012, [employer] Standard. (12/30/2007 2:38:05 PM)

## 2012-12-18 NOTE — Progress Notes (Signed)
Pt taken to er by [facility] per Tiana Loft pa.  [facility] without incident  dnr

## 2012-12-18 NOTE — ED Notes (Signed)
Pt sent from [facility]. Pt reportedly has abnormal blood glucose over 700mg /dl and hyperkalemia. Pt denies any symptoms. Denies pain. Pt a/o x 3. Pt arrives with 22g HL in R forearm in situ.

## 2012-12-18 NOTE — Progress Notes (Signed)
ANTICOAGULATION CONSULT NOTE - Initial Consult  Pharmacy Consult for Coumadin Indication: Afib, and per H&P; embolism-blood clot  Allergies  Allergen Reactions  . Latex     "pulls skin off"   Patient Measurements:   TBW 79 Kg as of 12/11/12  Vital Signs: Temp: 98.4 F (36.9 C) (03/13 1803) Temp src: Oral (03/13 1803) BP: 119/62 mmHg (03/13 2107) Pulse Rate: 62 (03/13 2107)  Labs:  Recent Labs  12/18/12 1023 12/18/12 1355 12/18/12 1840 12/18/12 1909 12/18/12 2115  HGB 14.4  --   --   --  12.6  HCT 41.2  --   --   --  35.0*  PLT 182  --   --   --  PENDING  LABPROT  --   --   --  26.7*  --   INR  --   --   --  2.62*  --   CREATININE 1.2* 1.1 0.69  --  0.64   The CrCl is unknown because both a height and weight (above a minimum accepted value) are required for this calculation.  Medical History: Past Medical History  Diagnosis Date  . Atrial fibrillation   . Coronary atherosclerosis of native coronary artery     Nonobstructive, LVEF 60%  . COPD (chronic obstructive pulmonary disease)   . Mixed hyperlipidemia   . Essential hypertension, benign   . Tachycardia-bradycardia syndrome   . Embolism - blood clot   . SOB (shortness of breath) on exertion   . Allergy     Latex  . Asthma   . Anxiety   . Myocardial infarction     12 years ago ?  [name] History of radiation therapy     59 gy fraction 5 eot 07/09/12 right lung  . 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 [START ON 12/19/2012] amLODipine  5 mg Oral Daily  . [START ON 12/19/2012] aspirin EC  81 mg Oral Daily  . atorvastatin  80 mg Oral q1800  . [START ON 12/19/2012] dexamethasone  4 mg Oral BID WC  . flecainide  100 mg Oral BID  . [START ON 12/19/2012] folic acid  1 mg Oral Daily  . [COMPLETED] insulin aspart  10 Units Subcutaneous Once  . [START ON 12/19/2012] irbesartan  300 mg Oral Daily  . [START ON 12/19/2012] nebivolol  10 mg Oral Daily   . [START ON 12/19/2012] pantoprazole  40 mg Oral Daily  . potassium chloride  10 mEq Intravenous Q1H  . [START ON 12/19/2012] potassium chloride SA  20 mEq Oral Daily  . [COMPLETED] sodium chloride  1,000 mL Intravenous Once  . warfarin  2.5 mg Oral Once  . [START ON 12/19/2012] Warfarin - Pharmacist Dosing Inpatient   Does not apply q1800  . [DISCONTINUED] atorvastatin  80 mg Oral q1800  . [DISCONTINUED] dexamethasone  4 mg Oral BID WC  . [DISCONTINUED] insulin regular  10 Units Subcutaneous Once  . [DISCONTINUED] potassium chloride  10 mEq Intravenous Q1H    Assessment: 88 yoF with NSC Lung Ca received chemo today at [facility] (Pemetrexed/Carbo). Found to have CBG > 700, and K+ 5.3  Hx of Afib on chronic Warfarin; also noted hx of embolism-blood clot (site unknown).  [facility] note re Warfarin states dose adjusted to 2.5mg  daily except 5mg  on Tues which is different from patient stated dosing of 2.5mg  Tu,Th,Sa & 5 mg other days.   INR tonight 2.62,  daily protimes ordered.  Goal of Therapy:  INR 2-3    Plan:  Warfarin 2.5mg  tonight Daily PT/INR to begin in am  Otho Bellows PharmD Pager [phone] 12/18/2012, 10:26 PM.

## 2012-12-18 NOTE — Progress Notes (Signed)
Pt tired of day, states "all this" is messing up her life and she is ready to go NOW.  adrena notified, spoke with pt, and now is calling dr Arbutus Ped.  Cl did draw final bs.  Brother cvame from lobby and is sititing with pt to help calm her until final lab results are in.  dmr

## 2012-12-18 NOTE — ED Provider Notes (Signed)
History     CSN: [id number]  Arrival date & time 12/18/12  1732   First MD Initiated Contact with Patient 12/18/12 1818      Chief Complaint  Patient presents with  . Abnormal Labs     (Consider location/radiation/quality/duration/timing/severity/associated sxs/prior treatment) HPI Comments: Patient sent to the ER for evaluation of hyperglycemia. Patient was seen in the cancer Center today for chemotherapy treatment of lung cancer. Routine lab work revealed elevated blood sugar and slight elevation of potassium. Patient was administered her chemotherapy as well as insulin. During the infusion, blood sugar was rechecked regularly and is staying high. Patient states that she does not have a history of diabetes. There is a family history of diabetes.  Patient does report that she has had blurred vision, increased thirst and increased urination over the last few days. She denies fever, nausea, vomiting, diarrhea. She has had a cough but no shortness of breath.   Past Medical History  Diagnosis Date  . Atrial fibrillation   . Coronary atherosclerosis of native coronary artery     Nonobstructive, LVEF 60%  . COPD (chronic obstructive pulmonary disease)   . Mixed hyperlipidemia   . Essential hypertension, benign   . Tachycardia-bradycardia syndrome   . Embolism - blood clot   . SOB (shortness of breath) on exertion   . Allergy     Latex  . Asthma   . Anxiety   . Myocardial infarction     12 years ago ?  [name] History of radiation therapy     59 gy fraction 5 eot 07/09/12 right lung  . Lung cancer 04/29/12 dx    RLL needle bx=invasive well diff adenocarcinoma  [facility]    Past Surgical History  Procedure Laterality Date  . Appendectomy    . Insert / replace / remove pacemaker  3/11     St. Jude - Dr. [name]  . Video bronchoscopy  02/22/2012    Procedure: VIDEO BRONCHOSCOPY WITH FLUORO;  Surgeon: Nyoka Cowden, MD;  Location: [facility];  Service: Cardiopulmonary;  Laterality:  Bilateral;  . Lung biopsy  04/29/12    RLL invasive well diff adenocarcinoma  . Lung biopsy  02/22/12    RML=benign lung parenchyma,no tumor sen  . Tonsillectomy and adenoidectomy      age 29  . Total abdominal hysterectomy  1975    1/2 right ovary intact, left salpingo-oppherctomy  . Mr brain w wo contrast  11/17/2012         Family History  Problem Relation Age of Onset  . Hypertension Mother   . Emphysema Brother     smoker  . Asthma Father   . Asthma Brother   . Lung cancer Paternal Grandmother     was a smoker  . Cancer Maternal Grandmother     lung, dx early 28's, died 3 days after lobectomy    History  Substance Use Topics  . Smoking status: Former Smoker -- 0.25 packs/day for 43 years    Types: Cigarettes    Quit date: 10/08/2012  . Smokeless tobacco: Former Neurosurgeon     Comment: quit 2 weeks ago after smoking since age 56. Up to 1 pack a day  . Alcohol Use: No    OB History   Grav Para Term Preterm Abortions TAB SAB Ect Mult Living                  Review of Systems  Eyes: Positive for visual disturbance.  Respiratory: Positive  for cough.   Cardiovascular: Negative.   Gastrointestinal: Negative.   Endocrine: Positive for polydipsia and polyuria.  All other systems reviewed and are negative.    Allergies  Latex  Home Medications   Current Outpatient Rx  Name  Route  Sig  Dispense  Refill  . albuterol (PROAIR HFA) 108 (90 BASE) MCG/ACT inhaler   Inhalation   Inhale 2 puffs into the lungs every 6 (six) hours as needed for wheezing or shortness of breath.         [name] albuterol (PROVENTIL) (2.5 MG/3ML) 0.083% nebulizer solution   Nebulization   Take 2.5 mg by nebulization every 6 (six) hours as needed for wheezing or shortness of breath.          [name] aspirin EC 81 MG tablet   Oral   Take 81 mg by mouth daily.         [name] dexamethasone (DECADRON) 4 MG tablet   Oral   Take 4 mg by mouth 2 (two) times daily with a meal.         . flecainide  (TAMBOCOR) 100 MG tablet   Oral   Take 1 tablet (100 mg total) by mouth 2 (two) times daily.   60 tablet   5   . folic acid (FOLVITE) 1 MG tablet   Oral   Take 1 tablet (1 mg total) by mouth daily.   30 tablet   0   . ipratropium (ATROVENT) 0.02 % nebulizer solution   Nebulization   Take 500 mcg by nebulization 4 (four) times daily as needed for wheezing (shortness of breath).          . meclizine (ANTIVERT) 25 MG tablet   Oral   Take 25 mg by mouth 3 (three) times daily as needed.          [name] MELATONIN ER PO   Oral   Take 5 mg by mouth at bedtime.         . nebivolol (BYSTOLIC) 10 MG tablet   Oral   Take 10 mg by mouth daily.          [name] oxyCODONE-acetaminophen (PERCOCET) 10-325 MG per tablet   Oral   Take 1 tablet by mouth every 8 (eight) hours as needed. Takes once daily  And prn for knees   30 tablet   0   . pantoprazole (PROTONIX) 40 MG tablet   Oral   Take 1 tablet (40 mg total) by mouth daily.   30 tablet   2   . potassium chloride SA (KLOR-CON M20) 20 MEQ tablet   Oral   Take 1 tablet (20 mEq total) by mouth daily.   30 tablet   5   . rosuvastatin (CRESTOR) 40 MG tablet   Oral   Take 1 tablet (40 mg total) by mouth daily.   30 tablet   11   . TRIBENZOR 40-5-25 MG TABS      take 1 tablet once daily   30 tablet   5   . warfarin (COUMADIN) 5 MG tablet      5mg  daily except 2.5mg  on Tuesdays, Thursdays and Saturdays or as directed by anticoagulation clinic   45 tablet   2     BP 100/73  Pulse 67  Temp(Src) 98.4 F (36.9 C) (Oral)  Resp 16  SpO2 95%  Physical Exam  Constitutional: She is oriented to person, place, and time. She appears well-developed and well-nourished. No distress.  HENT:  Head:  Normocephalic and atraumatic.  Right Ear: Hearing normal.  Nose: Nose normal.  Mouth/Throat: Oropharynx is clear and moist and mucous membranes are normal.  Eyes: Conjunctivae and EOM are normal. Pupils are equal, round, and reactive to  light.  Neck: Normal range of motion. Neck supple.  Cardiovascular: Normal rate, regular rhythm, S1 normal and S2 normal.  Exam reveals no gallop and no friction rub.   No murmur heard. Pulmonary/Chest: Effort normal and breath sounds normal. No respiratory distress. She exhibits no tenderness.  Abdominal: Soft. Normal appearance and bowel sounds are normal. There is no hepatosplenomegaly. There is no tenderness. There is no rebound, no guarding, no tenderness at McBurney's point and negative Murphy's sign. No hernia.  Musculoskeletal: Normal range of motion.  Neurological: She is alert and oriented to person, place, and time. She has normal strength. No cranial nerve deficit or sensory deficit. Coordination normal. GCS eye subscore is 4. GCS verbal subscore is 5. GCS motor subscore is 6.  Skin: Skin is warm, dry and intact. No rash noted. No cyanosis.  Psychiatric: She has a normal mood and affect. Her speech is normal and behavior is normal. Thought content normal.    ED Course  Procedures (including critical care time)  EKG:  Date: 12/18/2012  Rate: 63  Rhythm: normal sinus rhythm  QRS Axis: normal  Intervals: normal  ST/T Wave abnormalities: normal  Conduction Disutrbances:none  Narrative Interpretation:   Old EKG Reviewed: unchanged    Labs Reviewed  BASIC METABOLIC PANEL  KETONES, QUALITATIVE  URINALYSIS, ROUTINE W REFLEX MICROSCOPIC   No results found.   Diagnosis: Elevated Blood Sugar, likely due to Corticosteroid administration    MDM  Patient presents to the ER for new onset diabetes. Reviewing the records, however, reveals that the patient has had elevated blood sugars in conjunction with corticosteroids in the past. Patient is currently taking dexamethasone 4 mg a day and received 20 mg with infusion. Although blood sugars are significantly elevated, no sign of ketosis at this time. Patient will, however, require hospitalization for continued IV therapy and  management of elevated blood sugar.        Gilda Crease, MD 12/21/12 [phone]

## 2012-12-19 ENCOUNTER — Encounter (HOSPITAL_COMMUNITY): Payer: Self-pay

## 2012-12-19 DIAGNOSIS — E871 Hypo-osmolality and hyponatremia: Secondary | ICD-10-CM

## 2012-12-19 DIAGNOSIS — D72829 Elevated white blood cell count, unspecified: Secondary | ICD-10-CM

## 2012-12-19 DIAGNOSIS — D696 Thrombocytopenia, unspecified: Secondary | ICD-10-CM

## 2012-12-19 DIAGNOSIS — I10 Essential (primary) hypertension: Secondary | ICD-10-CM

## 2012-12-19 DIAGNOSIS — E875 Hyperkalemia: Secondary | ICD-10-CM

## 2012-12-19 DIAGNOSIS — Z950 Presence of cardiac pacemaker: Secondary | ICD-10-CM

## 2012-12-19 LAB — BASIC METABOLIC PANEL
BUN: 33 mg/dL — ABNORMAL HIGH (ref 6–23)
BUN: 33 mg/dL — ABNORMAL HIGH (ref 6–23)
BUN: 31 mg/dL — ABNORMAL HIGH (ref 6–23)
BUN: 36 mg/dL — ABNORMAL HIGH (ref 6–23)
CO2: 22 mEq/L (ref 19–32)
CO2: 24 mEq/L (ref 19–32)
CO2: 21 mEq/L (ref 19–32)
Calcium: 8.2 mg/dL — ABNORMAL LOW (ref 8.4–10.5)
Calcium: 8.2 mg/dL — ABNORMAL LOW (ref 8.4–10.5)
Chloride: 99 mEq/L (ref 96–112)
Chloride: 100 mEq/L (ref 96–112)
Chloride: 100 mEq/L (ref 96–112)
Chloride: 96 mEq/L (ref 96–112)
Creatinine, Ser: 0.67 mg/dL (ref 0.50–1.10)
Creatinine, Ser: 0.54 mg/dL (ref 0.50–1.10)
GFR calc Af Amer: >90 mL/min (ref >90)
GFR calc Af Amer: >90 mL/min (ref >90)
GFR calc non Af Amer: >90 mL/min (ref >90)
GFR calc non Af Amer: >90 mL/min (ref >90)
Glucose, Bld: 210 mg/dL — ABNORMAL HIGH (ref 70–99)
Glucose, Bld: 171 mg/dL — ABNORMAL HIGH (ref 70–99)
Glucose, Bld: 259 mg/dL — ABNORMAL HIGH (ref 70–99)
Glucose, Bld: 439 mg/dL — ABNORMAL HIGH (ref 70–99)
Potassium: 4.4 mEq/L (ref 3.5–5.1)
Potassium: 4.6 mEq/L (ref 3.5–5.1)
Potassium: 4.7 mEq/L (ref 3.5–5.1)
Potassium: 4.7 mEq/L (ref 3.5–5.1)
Sodium: 127 mEq/L — ABNORMAL LOW (ref 135–145)

## 2012-12-19 LAB — HEMOGLOBIN A1C
Hgb A1c MFr Bld: 9.6 % — ABNORMAL HIGH (ref <5.7)
Mean Plasma Glucose: 229 mg/dL — ABNORMAL HIGH (ref <117)

## 2012-12-19 LAB — GLUCOSE, CAPILLARY
Glucose-Capillary: 161 mg/dL — ABNORMAL HIGH (ref 70–99)
Glucose-Capillary: 165 mg/dL — ABNORMAL HIGH (ref 70–99)
Glucose-Capillary: 264 mg/dL — ABNORMAL HIGH (ref 70–99)
Glucose-Capillary: 142 mg/dL — ABNORMAL HIGH (ref 70–99)
Glucose-Capillary: 148 mg/dL — ABNORMAL HIGH (ref 70–99)
Glucose-Capillary: 187 mg/dL — ABNORMAL HIGH (ref 70–99)
Glucose-Capillary: 413 mg/dL — ABNORMAL HIGH (ref 70–99)
Glucose-Capillary: 349 mg/dL — ABNORMAL HIGH (ref 70–99)

## 2012-12-19 LAB — OSMOLALITY, URINE: Osmolality, Ur: 436 mOsm/kg (ref 390–1090)

## 2012-12-19 LAB — PROTIME-INR: INR: 2.71 — ABNORMAL HIGH (ref 0.00–1.49)

## 2012-12-19 MED ORDER — LIVING WELL WITH DIABETES BOOK
Freq: Once | Status: AC
  Administered 2012-12-19: 16:00:00
  Filled 2012-12-19: qty 1

## 2012-12-19 MED ORDER — INSULIN ASPART 100 UNIT/ML ~~LOC~~ SOLN
0.0000 [IU] | Freq: Three times a day (TID) | SUBCUTANEOUS | Status: DC
  Administered 2012-12-19: 5 [IU] via SUBCUTANEOUS

## 2012-12-19 MED ORDER — ADULT MULTIVITAMIN W/MINERALS CH
1.0000 | ORAL_TABLET | Freq: Every day | ORAL | Status: DC
  Administered 2012-12-19 – 2012-12-23 (×5): 1 via ORAL
  Filled 2012-12-19 (×5): qty 1

## 2012-12-19 MED ORDER — LIVING WELL WITH DIABETES BOOK
Freq: Once | Status: AC
  Filled 2012-12-19: qty 1

## 2012-12-19 MED ORDER — BD GETTING STARTED TAKE HOME KIT: 1/2ML X 30G SYRINGES
1.0000 | Freq: Once | Status: AC
  Administered 2012-12-19: 1
  Filled 2012-12-19: qty 1

## 2012-12-19 MED ORDER — INSULIN GLARGINE 100 UNIT/ML ~~LOC~~ SOLN
25.0000 [IU] | Freq: Once | SUBCUTANEOUS | Status: AC
  Administered 2012-12-19: 25 [IU] via SUBCUTANEOUS

## 2012-12-19 MED ORDER — ALBUTEROL SULFATE (5 MG/ML) 0.5% IN NEBU
2.5000 mg | INHALATION_SOLUTION | Freq: Three times a day (TID) | RESPIRATORY_TRACT | Status: DC
  Administered 2012-12-19 – 2012-12-22 (×10): 2.5 mg via RESPIRATORY_TRACT
  Filled 2012-12-19 (×12): qty 0.5

## 2012-12-19 MED ORDER — INSULIN ASPART 100 UNIT/ML ~~LOC~~ SOLN
10.0000 [IU] | Freq: Once | SUBCUTANEOUS | Status: AC
  Administered 2012-12-19: 10 [IU] via SUBCUTANEOUS

## 2012-12-19 MED ORDER — WARFARIN SODIUM 2.5 MG PO TABS
2.5000 mg | ORAL_TABLET | Freq: Once | ORAL | Status: AC
  Administered 2012-12-19: 2.5 mg via ORAL
  Filled 2012-12-19: qty 1

## 2012-12-19 MED ORDER — INSULIN GLARGINE 100 UNIT/ML ~~LOC~~ SOLN: 25.0000 [IU] | Freq: Every day | SUBCUTANEOUS | Status: DC

## 2012-12-19 MED ORDER — INSULIN ASPART 100 UNIT/ML ~~LOC~~ SOLN
0.0000 [IU] | Freq: Every day | SUBCUTANEOUS | Status: AC
  Administered 2012-12-19: 4 [IU] via SUBCUTANEOUS
  Administered 2012-12-20: 3 [IU] via SUBCUTANEOUS

## 2012-12-19 MED ORDER — GLUCERNA SHAKE PO LIQD
237.0000 mL | Freq: Two times a day (BID) | ORAL | Status: DC
  Administered 2012-12-19 – 2012-12-23 (×5): 237 mL via ORAL
  Filled 2012-12-19 (×10): qty 237

## 2012-12-19 MED ORDER — IPRATROPIUM BROMIDE 0.02 % IN SOLN
500.0000 ug | Freq: Three times a day (TID) | RESPIRATORY_TRACT | Status: DC
  Administered 2012-12-19 – 2012-12-22 (×10): 500 ug via RESPIRATORY_TRACT
  Filled 2012-12-19 (×12): qty 2.5

## 2012-12-19 NOTE — Progress Notes (Signed)
ANTICOAGULATION CONSULT NOTE - Follow Up Consult  Pharmacy Consult for Warfarin Indication: Afib, Hx PE  Allergies  Allergen Reactions  . Latex     "pulls skin off"    Patient Measurements:   ~79kg  Vital Signs: Temp: 97.2 F (36.2 C) (03/14 0003) Temp src: Oral (03/14 0003) BP: 131/58 mmHg (03/14 0800) Pulse Rate: 63 (03/14 0800)  Labs:  Recent Labs  12/18/12 1023  12/18/12 1909 12/18/12 2115 12/18/12 2230 12/19/12 0055 12/19/12 0228 12/19/12 0346  HGB 14.4  --   --  12.6 13.2  --   --   --   HCT 41.2  --   --  35.0* 37.1  --   --   --   PLT 182  --   --  114* 132*  --   --   --   LABPROT  --   --  26.7*  --   --   --  27.4*  --   INR  --   --  2.62*  --   --   --  2.71*  --   CREATININE 1.2*  < >  --  0.64 0.67 0.64 0.67 0.54  < > = values in this interval not displayed.  The CrCl is unknown because both a height and weight (above a minimum accepted value) are required for this calculation.   Medications:  Scheduled:  . albuterol  2.5 mg Nebulization TID  . amLODipine  5 mg Oral Daily  . aspirin EC  81 mg Oral Daily  . atorvastatin  80 mg Oral q1800  . dexamethasone  4 mg Oral BID WC  . flecainide  100 mg Oral BID  . folic acid  1 mg Oral Daily  . insulin aspart  0-5 Units Subcutaneous QHS  . insulin aspart  0-9 Units Subcutaneous TID WC  . [COMPLETED] insulin aspart  10 Units Subcutaneous Once  . [COMPLETED] insulin glargine  25 Units Subcutaneous Once  . ipratropium  500 mcg Nebulization TID  . irbesartan  300 mg Oral Daily  . nebivolol  10 mg Oral Daily  . pantoprazole  40 mg Oral Daily  . [COMPLETED] potassium chloride  10 mEq Intravenous Q1H  . [COMPLETED] sodium chloride  1,000 mL Intravenous Once  . [COMPLETED] warfarin  2.5 mg Oral Once  . Warfarin - Pharmacist Dosing Inpatient   Does not apply q1800  . [DISCONTINUED] atorvastatin  80 mg Oral q1800  . [DISCONTINUED] dexamethasone  4 mg Oral BID WC  . [DISCONTINUED] insulin regular  10 Units  Subcutaneous Once  . [DISCONTINUED] potassium chloride  10 mEq Intravenous Q1H  . [DISCONTINUED] potassium chloride SA  20 mEq Oral Daily   Infusions:  . sodium chloride 100 mL/hr (12/18/12 2302)  . dextrose 5 % and 0.45% NaCl 75 mL/hr (12/19/12 0111)  . insulin (NOVOLIN-R) infusion 2.5 Units/hr (12/19/12 0803)  . [DISCONTINUED] sodium chloride    . [DISCONTINUED] sodium chloride    . [DISCONTINUED] dextrose 5 % and 0.45% NaCl    . [DISCONTINUED] insulin (NOVOLIN-R) infusion 3 Units/hr (12/18/12 2157)    Assessment: 28 yoF with NSC Lung Ca received chemo today at [facility] (Pemetrexed/Carbo). Found to have CBG > 700, and K+ 5.3  [facility] note re Warfarin states dose adjusted to 2.5mg  daily except 5mg  on Tues which is different from patient stated dosing of 2.5mg  Tu,Th,Sa & 5 mg other days.   INR therapeutic 2.71 today  H/H improved from admit, Pltc low/stable, no bleeding/complications reported.  Beginning diet today, vitamin K intake should improve   Goal of Therapy:  INR 2-3   Plan:   Warfarin 2.5mg  today  Daily PT/INR  Reinforce dosing instructions prior to discharge  Loralee Pacas, PharmD, BCPS Pager: [phone] 12/19/2012,8:36 AM

## 2012-12-19 NOTE — Progress Notes (Signed)
INITIAL NUTRITION ASSESSMENT  DOCUMENTATION CODES Per approved criteria  -Not Applicable   INTERVENTION: Provide Glucerna BID Provide daily Multivitamin with mineral Diet education regarding adequate protein and calories during chemotherapy and decreased sugar intake  NUTRITION DIAGNOSIS: Unintentional wt loss related to cancer as evidenced by 15% wt loss in 6 months per pt.   Goal: Pt to meet >/= 90% of their estimated nutrition needs  Monitor:  PO intake Wt HgbA1c result/ diabetes diagnosis  Reason for Assessment: Consult  62 y.o. female  Admitting Dx: DKA (diabetic ketoacidoses)  ASSESSMENT: 62 y.o. female with history of metastatic non-small cell lung cancer who was started on chemotherapy today and was placed on Decadron from yesterday was found to be hyperglycemic the sugars around 700. Patient was referred to the ER where in blood work showed sugars to be more than 500 with anion gap and acetone positive. Patient has not had diabetes previously. Pt states that her appetite is good and she was eating well PTA. Pt reports her normal body weight is 200 lbs; pt thinks wt loss is due to cancer. Pt denies nausea, vomiting, and difficulty swallowing. Pt reports dry mouth as only GI symptom related to chemotherapy; pt uses Biotene. Pt states she "eats like a horse" and loves salad. Pt states she eats very little protein. Discussed protein sources and encouraged pt to get a protein-rich food at each meal. Pt feels she can add protein to salads and eat more fish. Pt states she drinks diet soda all day and denies eating much sweets. Pt does drink 1 Ensure supplement daily; encouraged pt to drink Glucerna instead.  Height: Ht Readings from Last 1 Encounters:  12/19/12 5\' 2"  (1.575 m)    Weight: Wt Readings from Last 1 Encounters:  12/19/12 169 lb 12.1 oz (77 kg)    Ideal Body Weight: 110 lbs  % Ideal Body Weight: 154%  Wt Readings from Last 10 Encounters:  12/19/12 169 lb  12.1 oz (77 kg)  12/11/12 173 lb 11.2 oz (78.79 kg)  12/04/12 166 lb 8 oz (75.524 kg)  12/04/12 166 lb 8 oz (75.524 kg)  11/28/12 171 lb 4.8 oz (77.701 kg)  11/16/12 168 lb 6.9 oz (76.4 kg)  11/21/12 175 lb 12.8 oz (79.742 kg)  11/11/12 173 lb (78.472 kg)  11/07/12 176 lb (79.833 kg)  10/29/12 176 lb 3.2 oz (79.924 kg)    Usual Body Weight: 200 lbs (6 months ago)  % Usual Body Weight: 85%  BMI:  Body mass index is 31.04 kg/(m^2).  Estimated Nutritional Needs: Kcal: 1940-2200 Protein: 92-108 grams Fluid: >2.3 L  Skin: Ecchymosis on abdomen  Diet Order: Carb Control  EDUCATION NEEDS: -Education needs addressed   Intake/Output Summary (Last 24 hours) at 12/19/12 1241 Last data filed at 12/19/12 0900  Gross per 24 hour  Intake 1240.84 ml  Output   1950 ml  Net -709.16 ml    Last BM: PTA  Labs:   Recent Labs Lab 12/19/12 0228 12/19/12 0346 12/19/12 1150  NA 132* 130* 127*  K 4.6 5.7* PENDING  CL 100 100 96  CO2 24 21 25   BUN 33* 32* 31*  CREATININE 0.67 0.54 0.62  CALCIUM 8.5 8.2* 8.6  GLUCOSE 173* 171* 259*    CBG (last 3)   Recent Labs  12/19/12 0304 12/19/12 0418 12/19/12 0529  GLUCAP 165* 161* 158*    Scheduled Meds: . albuterol  2.5 mg Nebulization TID  . amLODipine  5 mg Oral Daily  .  aspirin EC  81 mg Oral Daily  . atorvastatin  80 mg Oral q1800  . dexamethasone  4 mg Oral BID WC  . flecainide  100 mg Oral BID  . folic acid  1 mg Oral Daily  . insulin aspart  0-5 Units Subcutaneous QHS  . insulin aspart  0-9 Units Subcutaneous TID WC  . ipratropium  500 mcg Nebulization TID  . irbesartan  300 mg Oral Daily  . nebivolol  10 mg Oral Daily  . pantoprazole  40 mg Oral Daily  . warfarin  2.5 mg Oral ONCE-1800  . Warfarin - Pharmacist Dosing Inpatient   Does not apply q1800    Continuous Infusions: . sodium chloride 100 mL/hr at 12/19/12 1157  . dextrose 5 % and 0.45% NaCl 75 mL/hr (12/19/12 0111)  . insulin (NOVOLIN-R) infusion  5.1 Units/hr (12/19/12 4098)    Past Medical History  Diagnosis Date  . Atrial fibrillation   . Coronary atherosclerosis of native coronary artery     Nonobstructive, LVEF 60%  . COPD (chronic obstructive pulmonary disease)   . Mixed hyperlipidemia   . Essential hypertension, benign   . Tachycardia-bradycardia syndrome   . Embolism - blood clot   . SOB (shortness of breath) on exertion   . Allergy     Latex  . Asthma   . Anxiety   . Myocardial infarction     12 years ago ?  [name] History of radiation therapy     59 gy fraction 5 eot 07/09/12 right lung  . Lung cancer 04/29/12 dx    RLL needle bx=invasive well diff adenocarcinoma  [facility]    Past Surgical History  Procedure Laterality Date  . Appendectomy    . Insert / replace / remove pacemaker  3/11     [employer]. Jude - Dr. [name]  . Video bronchoscopy  02/22/2012    Procedure: VIDEO BRONCHOSCOPY WITH FLUORO;  Surgeon: Nyoka Cowden, MD;  Location: [facility];  Service: Cardiopulmonary;  Laterality: Bilateral;  . Lung biopsy  04/29/12    RLL invasive well diff adenocarcinoma  . Lung biopsy  02/22/12    RML=benign lung parenchyma,no tumor sen  . Tonsillectomy and adenoidectomy      age 72  . Total abdominal hysterectomy  1975    1/2 right ovary intact, left salpingo-oppherctomy  . Mr brain w wo contrast  11/17/2012         Ian Malkin RD, LDN Inpatient Clinical Dietitian Pager: [phone] After Hours Pager: [phone]

## 2012-12-19 NOTE — Clinical Documentation Improvement (Signed)
Abnormal Labs Clarification  THIS DOCUMENT IS NOT A PERMANENT PART OF THE MEDICAL RECORD  TO RESPOND TO THE THIS QUERY, FOLLOW THE INSTRUCTIONS BELOW:  1. If needed, update documentation for the patient's encounter via the notes activity.  2. Access this query again and click edit on the [employer].  3. After updating, or not, click F2 to complete all highlighted (required) fields concerning your review. Select "additional documentation in the medical record" OR "no additional documentation provided".  4. Click Sign note button.  5. The deficiency will fall out of your InBasket *Please let us know if you are not able to complete this workflow by phone or e-mail (listed below).  Please update your documentation within the medical record to reflect your response to this query.                                                                                   12/19/12  Dear Dr. Malachi Bonds, Judie Petit [place]  In a better effort to capture your patient's severity of illness, reflect appropriate length of stay and utilization of resources, a review of the medical record has revealed the following indicators.    Based on your clinical judgment, please clarify and document in a progress note and/or discharge summary the clinical condition associated with the following supporting information:  In responding to this query please exercise your independent judgment.  The fact that a query is asked, does not imply that any particular answer is desired or expected.  Abnormal findings (laboratory, x-ray, pathologic, and other diagnostic results) are not coded and reported unless the physician indicates their clinical significance.   The medical record reflects the following clinical findings, please clarify the diagnostic and/or clinical significance:       Pt w/COPD according to HP  Clarification Needed    Please clarify if "COPD" in setting of mets lung ca necessitating the treatment of Albuterol  and atrovent can be further specified as one of the diagnoses listed below and document in pn or d/c summary.       Possible Clinical Conditions?                                   COPD exacerbation  Chronic Respiratory Failure                                        Other Condition___________________                 Cannot Clinically Determine_________    Supporting Information: HP SOB (shortness of breath) on exertion  COPD - presently not wheezing. Continue inhalers  Diagnostics: CXR 12/18/12 IMPRESSION:    Interval decrease in size of nodular density lower right chest.  Enlargement of cardiac silhouette.  Minimal right basilar atelectasis   Treatment:  Listed above    Reviewed: additional documentation in the medical record   Thank You,  Enis Slipper  RN, BSN, MSN/Inf, CCDS Clinical Documentation Specialist [facility] Dept Pager: [phone] /  E-mail: [email or web address] Health Information Management [place]

## 2012-12-19 NOTE — Progress Notes (Signed)
Inpatient Diabetes Program Recommendations  AACE/ADA: New Consensus Statement on Inpatient Glycemic Control (2013)  Target Ranges:  Prepandial:   less than 140 mg/dL      Peak postprandial:   less than 180 mg/dL (1-2 hours)      Critically ill patients:  140 - 180 mg/dL   Reason for Visit: Consult - DKA  DKA due to steroid induce hyperglycemia. Gap closed, bicarb normal, fingersticks stable on insulin gtt around 2.6 units/hour  HgbA1C pending.  Pt has no hx of DM.  States she is overwhelmed with everything.  Was able to give insulin prior to lunch meal.  States she has had blurred vision for approx 3 weeks.  Discussed new diagnosis of DM, importance of controlling blood sugars, monitoring and insulin administration.  Will order Living Well With Diabetes book and encouraged pt to view diabetes videos on pt ed channel.     Results for [name], [name] (MRN [id number]) as of 12/19/2012 13:12  Ref. Range 12/19/2012 11:50  Sodium Latest Range: 136-145 mEq/L 127 (L)  Potassium Latest Range: 3.5-5.1 mEq/L 4.7  Chloride Latest Range: 96-112 mEq/L 96  CO2 Latest Range: 19-32 mEq/L 25  BUN Latest Range: 7.0-26.0 mg/dL 31 (H)  Creatinine Latest Range: 0.50-1.10 mg/dL 3.47  Calcium Latest Range: 8.4-10.5 mg/dL 8.6  GFR calc non Af Amer Latest Range: >90 mL/min >90  GFR calc Af Amer Latest Range: >90 mL/min >90  Glucose Latest Range: 70-99 mg/dl 425 (H)   Results for [name], [name] (MRN [id number]) as of 12/19/2012 13:12  Ref. Range 12/19/2012 01:03 12/19/2012 02:04 12/19/2012 03:04 12/19/2012 04:18 12/19/2012 05:29  Glucose-Capillary Latest Range: 70-99 mg/dL 332 (H) 951 (H) 884 (H) 161 (H) 158 (H)     Inpatient Diabetes Program Recommendations Insulin - IV drip/GlucoStabilizer: Lantus 25 units given prior to discontinuation of GlucoStabilizer Insulin - Basal: Recommend Lantus 20 units QAM Insulin - Meal Coverage: Add Novolog 4 units tidwc if pt eats >50% meal HgbA1C: Pending Outpatient Referral: OP  Diabetes Education consult for steroid-induced hypoglycemia  Note: Acidosis cleared.  Pt seems motivated to control blood sugars when discharged.  Stressed importance of f/u with PCP to manage DM.  Will likely need less insulin when steroids are decreased.  Monitor closely.  Will continue to follow.  Thank you. Ailene Ards, RD, LDN, CDE Inpatient Diabetes Coordinator [phone]

## 2012-12-19 NOTE — Progress Notes (Signed)
[id number] Earlene Plater, RN, BSN, CCM:  CHART REVIEWED AND UPDATED.  Next chart review due on [id number]. NO DISCHARGE NEEDS PRESENT AT THIS TIME. CASE MANAGEMENT [phone]

## 2012-12-19 NOTE — Progress Notes (Addendum)
[facility] PROGRESS NOTE  Kari ELBERT ZOX:[id number] DOB: 04-02-1951 DOA: 12/18/2012 PCP: Ernestine Conrad, MD  Assessment/Plan  DKA due to steroid induce hyperglycemia.  Gap closed, bicarb normal, fingersticks stable on insulin gtt around 2.6 units/hour -  Hemoglobin A1c -  Lantus 25 units now and d/c insulin gtt in 2 hours -  SSI aspart low dose with meals.   -  Start diabetic diet and give meal now -  BMP in 4 hours -  If BMP stable and fingersticks okay, may transfer to med-surg bed this afternoon  Hyperkalemia:   -  No peaked T-waves -  D/c standing potassium -  Will recheck BMP in a few hours  Leukocytosis, likely reactive from DKA, but has been stable since Feb -  No evidence of underlying infection -  Trending down  Hyponatremia - probably from dehydration and hyperglycemia. Since patient also has lung cancer possibilities would include SIADH. Closely follow metabolic panel. Check urine sodium and osmolality.   A. fib and tachybradycardia syndrome  - on Coumadin. Dosing per pharmacy.  -  Continue rate limiting medications.   History of PE - Coumadin per pharmacy.   COPD - some wheezing this morning L>R, but no hypoxia or resp distress.  Mild acute COPD exacerbation.   -  Continue inhalers -  Add scheduled duonebs TID  Hypertension - stable.  -  continue home medications.   Hyperlipidemia - stable - continue statins.   Metastatic lung cancer - per oncology.  Diet:  diabetic IVF:  OFF when insulin gtt d/c Proph:  Therapeutic warfarin  Code Status: full  Family Communication: spoke with pt alone Disposition Plan: pending stable fingersticks and education   Consultants:  none  Procedures:  none  Antibiotics:  none   HPI/Subjective:  Feels well.  Has had blurry vision for last 3 weeks.  Denies HA, CP, SOB, N/V/D/C, abdominal pain.    Objective: Filed Vitals:   12/19/12 0100 12/19/12 0300 12/19/12 0400 12/19/12 0800  BP: 91/51 131/50  119/58 131/58  Pulse: 59 62 65 63  Temp:      TempSrc:      Resp: 16 16 17 19   SpO2: 95% 97% 94% 96%    Intake/Output Summary (Last 24 hours) at 12/19/12 0816 Last data filed at 12/19/12 0800  Gross per 24 hour  Intake    999 ml  Output   1950 ml  Net   -951 ml   There were no vitals filed for this visit.  Exam:   General:  CF, No acute distress  HEENT:  NCAT, MMM  Cardiovascular:  RRR, nl S1, S2 no mrg, 2+ pulses, warm extremities  Respiratory:  Wheezing bilaterally, louder on left than right side  Abdomen:   NABS, soft, NT/ND, central obesity  MSK:   Normal tone and bulk, no LEE  Neuro:  Grossly intact  Data Reviewed: Basic Metabolic Panel:  Recent Labs Lab 12/18/12 2115 12/18/12 2230 12/19/12 0055 12/19/12 0228 12/19/12 0346  NA 128* 131* 131* 132* 130*  K 4.4 4.0 4.4 4.6 5.7*  CL 97 97 99 100 100  CO2 22 24 22 24 21   GLUCOSE 392* 345* 210* 173* 171*  BUN 38* 37* 33* 33* 32*  CREATININE 0.64 0.67 0.64 0.67 0.54  CALCIUM 7.9* 8.0* 8.3* 8.5 8.2*   Liver Function Tests:  Recent Labs Lab 12/18/12 1023  AST 22  ALT 64*  ALKPHOS 154*  BILITOT 0.86  PROT 5.6*  ALBUMIN 3.0*  No results found for this basename: LIPASE, AMYLASE,  in the last 168 hours No results found for this basename: AMMONIA,  in the last 168 hours CBC:  Recent Labs Lab 12/18/12 1023 12/18/12 2115 12/18/12 2230  WBC 21.4* 15.0* 13.9*  NEUTROABS 20.0*  --   --   HGB 14.4 12.6 13.2  HCT 41.2 35.0* 37.1  MCV 90.4 89.1 89.4  PLT 182 114* 132*   Cardiac Enzymes: No results found for this basename: CKTOTAL, CKMB, CKMBINDEX, TROPONINI,  in the last 168 hours BNP (last 3 results) No results found for this basename: PROBNP,  in the last 8760 hours CBG:  Recent Labs Lab 12/19/12 0103 12/19/12 0204 12/19/12 0304 12/19/12 0418 12/19/12 0529  GLUCAP 210* 178* 165* 161* 158*    Recent Results (from the past 240 hour(s))  MRSA PCR SCREENING     Status: None   Collection  Time    12/18/12 10:03 PM      Result Value Range Status   MRSA by PCR NEGATIVE  NEGATIVE Final   Comment:            The GeneXpert MRSA Assay (FDA     approved for NASAL specimens     only), is one component of a     comprehensive MRSA colonization     surveillance program. It is not     intended to diagnose MRSA     infection nor to guide or     monitor treatment for     MRSA infections.     Studies: Dg Chest 2 View  12/18/2012  *RADIOLOGY REPORT*  Clinical Data: Cough, weakness, shortness of breath  CHEST - 2 VIEW  Comparison: 04/29/2012  Findings: Left subclavian sequential transvenous pacemaker leads project within the right atrium and right ventricle. Enlargement of cardiac silhouette. Mediastinal contours and pulmonary vascularity normal. Interval decrease in size of nodular density at lower right chest, now 15 x 11 mm, previously 37 x 36 mm. Minimal right basilar atelectasis. Remaining lungs clear. No pleural effusion or pneumothorax. No acute osseous findings.  IMPRESSION: Interval decrease in size of nodular density lower right chest. Enlargement of cardiac silhouette. Minimal right basilar atelectasis.   Original Report Authenticated By: Ulyses Southward, M.D.     Scheduled Meds: . amLODipine  5 mg Oral Daily  . aspirin EC  81 mg Oral Daily  . atorvastatin  80 mg Oral q1800  . dexamethasone  4 mg Oral BID WC  . flecainide  100 mg Oral BID  . folic acid  1 mg Oral Daily  . insulin aspart  0-5 Units Subcutaneous QHS  . insulin aspart  0-9 Units Subcutaneous TID WC  . irbesartan  300 mg Oral Daily  . nebivolol  10 mg Oral Daily  . pantoprazole  40 mg Oral Daily  . Warfarin - Pharmacist Dosing Inpatient   Does not apply q1800   Continuous Infusions: . sodium chloride 100 mL/hr (12/18/12 2302)  . dextrose 5 % and 0.45% NaCl 75 mL/hr (12/19/12 0111)  . insulin (NOVOLIN-R) infusion 2.5 Units/hr (12/19/12 0803)    Principal Problem:   DKA (diabetic ketoacidoses) Active  Problems:   BRADYCARDIA-TACHYCARDIA SYNDROME   COPD (chronic obstructive pulmonary disease)   Ataxia    Time spent: 30 min    [name], [facility]  [facility] Pager [phone]. If 7PM-7AM, please contact night-coverage at [email or web address], password [facility] 12/19/2012, 8:16 AM  LOS: 1 day

## 2012-12-19 NOTE — Plan of Care (Signed)
Problem: Consults Goal: Diagnosis-Diabetes Mellitus Outcome: Completed/Met Date Met:  12/19/12 New Onset Type II

## 2012-12-20 DIAGNOSIS — R739 Hyperglycemia, unspecified: Secondary | ICD-10-CM

## 2012-12-20 DIAGNOSIS — J45901 Unspecified asthma with (acute) exacerbation: Secondary | ICD-10-CM

## 2012-12-20 DIAGNOSIS — D72829 Elevated white blood cell count, unspecified: Secondary | ICD-10-CM

## 2012-12-20 DIAGNOSIS — J441 Chronic obstructive pulmonary disease with (acute) exacerbation: Secondary | ICD-10-CM

## 2012-12-20 LAB — CBC
MCH: 31.7 pg (ref 26.0–34.0)
MCHC: 34.9 g/dL (ref 30.0–36.0)
MCV: 90.8 fL (ref 78.0–100.0)
Platelets: 103 10*3/uL — ABNORMAL LOW (ref 150–400)
RDW: 14 % (ref 11.5–15.5)

## 2012-12-20 LAB — GLUCOSE, CAPILLARY
Glucose-Capillary: 316 mg/dL — ABNORMAL HIGH (ref 70–99)
Glucose-Capillary: 251 mg/dL — ABNORMAL HIGH (ref 70–99)

## 2012-12-20 LAB — BASIC METABOLIC PANEL
BUN: 35 mg/dL — ABNORMAL HIGH (ref 6–23)
CO2: 21 mEq/L (ref 19–32)
Calcium: 8.2 mg/dL — ABNORMAL LOW (ref 8.4–10.5)
Creatinine, Ser: 0.72 mg/dL (ref 0.50–1.10)
GFR calc non Af Amer: >90 mL/min (ref >90)
Glucose, Bld: 382 mg/dL — ABNORMAL HIGH (ref 70–99)

## 2012-12-20 MED ORDER — METOPROLOL TARTRATE 1 MG/ML IV SOLN
5.0000 mg | Freq: Once | INTRAVENOUS | Status: AC
  Administered 2012-12-20: 5 mg via INTRAVENOUS
  Filled 2012-12-20: qty 5

## 2012-12-20 MED ORDER — METOPROLOL TARTRATE 1 MG/ML IV SOLN
5.0000 mg | Freq: Once | INTRAVENOUS | Status: AC
  Administered 2012-12-20: 5 mg via INTRAVENOUS

## 2012-12-20 MED ORDER — ALBUTEROL SULFATE (5 MG/ML) 0.5% IN NEBU: 2.5000 mg | INHALATION_SOLUTION | RESPIRATORY_TRACT | Status: DC | PRN

## 2012-12-20 MED ORDER — DILTIAZEM HCL 25 MG/5ML IV SOLN: 10.0000 mg | Freq: Once | INTRAVENOUS | Status: DC

## 2012-12-20 MED ORDER — DILTIAZEM HCL 100 MG IV SOLR
5.0000 mg/h | INTRAVENOUS | Status: DC
  Administered 2012-12-20: 10 mg/h via INTRAVENOUS
  Administered 2012-12-20: 5 mg/h via INTRAVENOUS
  Administered 2012-12-20: 10 mg/h via INTRAVENOUS
  Filled 2012-12-20: qty 100

## 2012-12-20 MED ORDER — DILTIAZEM HCL 25 MG/5ML IV SOLN
10.0000 mg | Freq: Once | INTRAVENOUS | Status: AC
  Administered 2012-12-20: 10 mg via INTRAVENOUS
  Filled 2012-12-20: qty 5

## 2012-12-20 MED ORDER — INSULIN GLARGINE 100 UNIT/ML ~~LOC~~ SOLN
30.0000 [IU] | Freq: Every day | SUBCUTANEOUS | Status: DC
  Administered 2012-12-20: 30 [IU] via SUBCUTANEOUS

## 2012-12-20 MED ORDER — INSULIN ASPART 100 UNIT/ML ~~LOC~~ SOLN: 8.0000 [IU] | Freq: Three times a day (TID) | SUBCUTANEOUS | Status: DC

## 2012-12-20 MED ORDER — DM-GUAIFENESIN ER 30-600 MG PO TB12
1.0000 | ORAL_TABLET | Freq: Two times a day (BID) | ORAL | Status: DC
  Administered 2012-12-20: 21:00:00 via ORAL
  Administered 2012-12-20 – 2012-12-21 (×2): 1 via ORAL
  Administered 2012-12-21: 22:00:00 via ORAL
  Administered 2012-12-22 – 2012-12-23 (×3): 1 via ORAL
  Filled 2012-12-20 (×8): qty 1

## 2012-12-20 MED ORDER — INSULIN ASPART 100 UNIT/ML ~~LOC~~ SOLN
4.0000 [IU] | Freq: Three times a day (TID) | SUBCUTANEOUS | Status: DC
  Administered 2012-12-20 (×2): 4 [IU] via SUBCUTANEOUS

## 2012-12-20 MED ORDER — METOPROLOL TARTRATE 1 MG/ML IV SOLN
INTRAVENOUS | Status: AC
  Filled 2012-12-20: qty 5

## 2012-12-20 MED ORDER — INSULIN ASPART 100 UNIT/ML ~~LOC~~ SOLN: 4.0000 [IU] | Freq: Three times a day (TID) | SUBCUTANEOUS | Status: DC

## 2012-12-20 MED ORDER — INSULIN ASPART 100 UNIT/ML ~~LOC~~ SOLN
0.0000 [IU] | Freq: Three times a day (TID) | SUBCUTANEOUS | Status: AC
  Administered 2012-12-20: 3 [IU] via SUBCUTANEOUS
  Administered 2012-12-20: 11 [IU] via SUBCUTANEOUS
  Administered 2012-12-20 – 2012-12-21 (×2): 8 [IU] via SUBCUTANEOUS
  Administered 2012-12-21: 5 [IU] via SUBCUTANEOUS
  Administered 2012-12-21: 8 [IU] via SUBCUTANEOUS
  Administered 2012-12-22: 5 [IU] via SUBCUTANEOUS
  Administered 2012-12-22: 3 [IU] via SUBCUTANEOUS
  Administered 2012-12-22: 5 [IU] via SUBCUTANEOUS

## 2012-12-20 MED ORDER — BENZONATATE 100 MG PO CAPS
100.0000 mg | ORAL_CAPSULE | Freq: Three times a day (TID) | ORAL | Status: DC | PRN
  Filled 2012-12-20: qty 1

## 2012-12-20 NOTE — Progress Notes (Signed)
ANTICOAGULATION CONSULT NOTE - Follow Up Consult  Pharmacy Consult for Warfarin Indication: Afib, Hx PE  Allergies  Allergen Reactions  . Latex     "pulls skin off"    Patient Measurements: Height: 5\' 2"  (157.5 cm) Weight: 169 lb 12.1 oz (77 kg) IBW/kg (Calculated) : 50.1 ~79kg  Vital Signs: Temp: 98.7 F (37.1 C) (03/15 0800) Temp src: Oral (03/15 0800) BP: 127/52 mmHg (03/15 0400) Pulse Rate: 64 (03/15 0400)  Labs:  Recent Labs  12/18/12 1909  12/18/12 2115 12/18/12 2230  12/19/12 0228  12/19/12 1150 12/19/12 1728 12/20/12 0410  HGB  --   < > 12.6 13.2  --   --   --   --   --  12.0  HCT  --   --  35.0* 37.1  --   --   --   --   --  34.4*  PLT  --   --  114* 132*  --   --   --   --   --  103*  LABPROT 26.7*  --   --   --   --  27.4*  --   --   --  37.0*  INR 2.62*  --   --   --   --  2.71*  --   --   --  4.06*  CREATININE  --   --  0.64 0.67  < > 0.67  < > 0.62 0.70 0.72  < > = values in this interval not displayed.  Estimated Creatinine Clearance: 70.1 ml/min (by C-G formula based on Cr of 0.72).   Medications:  Scheduled:  . albuterol  2.5 mg Nebulization TID  . amLODipine  5 mg Oral Daily  . aspirin EC  81 mg Oral Daily  . atorvastatin  80 mg Oral q1800  . [COMPLETED] bd getting started take home kit  1 kit Other Once  . dexamethasone  4 mg Oral BID WC  . dextromethorphan-guaiFENesin  1 tablet Oral BID  . feeding supplement  237 mL Oral BID BM  . flecainide  100 mg Oral BID  . folic acid  1 mg Oral Daily  . insulin aspart  0-15 Units Subcutaneous TID WC  . insulin aspart  0-5 Units Subcutaneous QHS  . [COMPLETED] insulin aspart  10 Units Subcutaneous Once  . insulin aspart  4 Units Subcutaneous TID WC  . insulin glargine  30 Units Subcutaneous Daily  . ipratropium  500 mcg Nebulization TID  . irbesartan  300 mg Oral Daily  . [COMPLETED] living well with diabetes book   Does not apply Once  . [COMPLETED] living well with diabetes book   Does not  apply Once  . multivitamin with minerals  1 tablet Oral Daily  . nebivolol  10 mg Oral Daily  . pantoprazole  40 mg Oral Daily  . [COMPLETED] warfarin  2.5 mg Oral ONCE-1800  . Warfarin - Pharmacist Dosing Inpatient   Does not apply q1800  . [DISCONTINUED] insulin aspart  0-9 Units Subcutaneous TID WC  . [DISCONTINUED] insulin glargine  25 Units Subcutaneous Daily   Infusions:  . sodium chloride 1,000 mL (12/20/12 0734)  . [DISCONTINUED] dextrose 5 % and 0.45% NaCl 75 mL/hr (12/19/12 0111)  . [DISCONTINUED] insulin (NOVOLIN-R) infusion Stopped (12/19/12 1000)    Assessment: 53 yoF with NSC Lung Ca received chemo 3/13 at [facility] (Pemetrexed/Carbo). Found to be in DKA  [facility] note re Warfarin states dose adjusted to 2.5mg  daily except 5mg   on Tues which is different from patient stated dosing of 2.5mg  Tu,Th,Sa & 5 mg other days.   INR supratherapeutic with significant rise overnight (2.71 > 4.06)  H/H essentially stable, Pltc falling as expected s/p chemo, no bleeding/complications reported.  Goal of Therapy:  INR 2-3   Plan:   Hold warfarin today  Daily PT/INR  Reinforce dosing instructions prior to discharge  Loralee Pacas, PharmD, BCPS Pager: [phone] 12/20/2012,9:05 AM

## 2012-12-20 NOTE — Progress Notes (Addendum)
[facility] PROGRESS NOTE  [name]:[id number] DOB: [birth year] DOA: 12/18/2012 PCP: Ernestine Conrad, MD  Assessment/Plan  DKA due to steroid induce hyperglycemia resolved, now with hyperglycemia.   -  Hemoglobin A1c 9.6 -  Increase to Lantus 30 units -  Add 4 units aspart with meals -  Increase to moderate SSI aspart with meals.   -  Cont qhs insulin -  Teaching -  Will need home diabetes supplies and further education today  Hyperkalemia:  Resolved.  Leukocytosis, likely reactive from DKA, but rose slightly today but baseline is elevated. -  Monitor for signs of infection  Hyponatremia - probably from dehydration and hyperglycemia. Since patient also has lung cancer possibilities would include SIADH.  -  Urine sodium 90 and osmolality 436, likely hyperglycemia induce diuresis and possibly some underlying SIADH.  Would recheck if sodium still low after fingersticks stable for a while.    A. fib and tachybradycardia syndrome.  INR elevated today.  Atrial paced on telemetry >> Developed atrial fibrillation with rate in 160s later this morning.  Patient received beta blocker and flecainide this AM.  BP stable.  Given metop 5mg  IV once and rate slowed to 130s.  Will give second dose.  -  Will touch base with [facility] cards - pt of Dr. Lubertha Basque and seen recently in clinic.   - Coumadin dosing per pharmacy.  -  Continue rate limiting medications.   History of PE - Coumadin per pharmacy.   COPD - Improved wheezing this morning L>R, but no hypoxia or resp distress.  Mild acute COPD exacerbation with symptomatic improvement with nebs.   -  Continue duonebs TID with albuterol -  Add mucinex and tessalon  Hypertension - stable.  -  continue home medications.   Hyperlipidemia - stable - continue statins.   Metastatic lung cancer - per oncology.  Diet:  Diabetic ACCESS:  PIV IVF:  NS at 58ml/h Proph:  Therapeutic warfarin  Code Status: full  Family Communication: spoke with  pt alone Disposition Plan: transfer to telemetry   Consultants:  none  Procedures:  none  Antibiotics:  none   HPI/Subjective:  Feels well.  Denies HA, CP, SOB, N/V/D/C, abdominal pain.  Has some cough without sinus congestion or wheeze.  Objective: Filed Vitals:   12/19/12 2043 12/20/12 0000 12/20/12 0303 12/20/12 0400  BP:  96/35 121/51 127/52  Pulse:  65 65 64  Temp:    97.9 F (36.6 C)  TempSrc:    Oral  Resp:  15 13 14   Height:      Weight:      SpO2: 90% 91% 88% 90%    Intake/Output Summary (Last 24 hours) at 12/20/12 0759 Last data filed at 12/20/12 0600  Gross per 24 hour  Intake 2978.65 ml  Output   2150 ml  Net 828.65 ml   Filed Weights   12/19/12 0800  Weight: 77 kg (169 lb 12.1 oz)    Exam:   General:  CF, No acute distress  HEENT:  NCAT, MMM  Cardiovascular:  RRR, nl S1, S2 no mrg, 2+ pulses, warm extremities  Respiratory:  Wheezing bilaterally, today more on right side.  Diminished on left.  No rales or rhonchi  Abdomen:   NABS, soft, NT/ND, central obesity  MSK:   Normal tone and bulk, no LEE  Neuro:  Grossly intact  Data Reviewed: Basic Metabolic Panel:  Recent Labs Lab 12/19/12 0228 12/19/12 0346 12/19/12 1150 12/19/12 1728 12/20/12 0410  NA 132* 130* 127* 127* 127*  K 4.6 5.7* 4.7 4.7 4.6  CL 100 100 96 96 97  CO2 24 21 25 22 21   GLUCOSE 173* 171* 259* 439* 382*  BUN 33* 32* 31* 36* 35*  CREATININE 0.67 0.54 0.62 0.70 0.72  CALCIUM 8.5 8.2* 8.6 8.2* 8.2*   Liver Function Tests:  Recent Labs Lab 12/18/12 1023  AST 22  ALT 64*  ALKPHOS 154*  BILITOT 0.86  PROT 5.6*  ALBUMIN 3.0*   No results found for this basename: LIPASE, AMYLASE,  in the last 168 hours No results found for this basename: AMMONIA,  in the last 168 hours CBC:  Recent Labs Lab 12/18/12 1023 12/18/12 2115 12/18/12 2230 12/20/12 0410  WBC 21.4* 15.0* 13.9* 16.4*  NEUTROABS 20.0*  --   --   --   HGB 14.4 12.6 13.2 12.0  HCT 41.2  35.0* 37.1 34.4*  MCV 90.4 89.1 89.4 90.8  PLT 182 114* 132* 103*   Cardiac Enzymes: No results found for this basename: CKTOTAL, CKMB, CKMBINDEX, TROPONINI,  in the last 168 hours BNP (last 3 results) No results found for this basename: PROBNP,  in the last 8760 hours CBG:  Recent Labs Lab 12/19/12 1046 12/19/12 1150 12/19/12 1648 12/19/12 2138 12/19/12 2221  GLUCAP 188* 260* 413* 349* 383*    Recent Results (from the past 240 hour(s))  MRSA PCR SCREENING     Status: None   Collection Time    12/18/12 10:03 PM      Result Value Range Status   MRSA by PCR NEGATIVE  NEGATIVE Final   Comment:            The GeneXpert MRSA Assay (FDA     approved for NASAL specimens     only), is one component of a     comprehensive MRSA colonization     surveillance program. It is not     intended to diagnose MRSA     infection nor to guide or     monitor treatment for     MRSA infections.     Studies: Dg Chest 2 View  12/18/2012  *RADIOLOGY REPORT*  Clinical Data: Cough, weakness, shortness of breath  CHEST - 2 VIEW  Comparison: 04/29/2012  Findings: Left subclavian sequential transvenous pacemaker leads project within the right atrium and right ventricle. Enlargement of cardiac silhouette. Mediastinal contours and pulmonary vascularity normal. Interval decrease in size of nodular density at lower right chest, now 15 x 11 mm, previously 37 x 36 mm. Minimal right basilar atelectasis. Remaining lungs clear. No pleural effusion or pneumothorax. No acute osseous findings.  IMPRESSION: Interval decrease in size of nodular density lower right chest. Enlargement of cardiac silhouette. Minimal right basilar atelectasis.   Original Report Authenticated By: Ulyses Southward, M.D.     Scheduled Meds: . albuterol  2.5 mg Nebulization TID  . amLODipine  5 mg Oral Daily  . aspirin EC  81 mg Oral Daily  . atorvastatin  80 mg Oral q1800  . dexamethasone  4 mg Oral BID WC  . dextromethorphan-guaiFENesin  1  tablet Oral BID  . feeding supplement  237 mL Oral BID BM  . flecainide  100 mg Oral BID  . folic acid  1 mg Oral Daily  . insulin aspart  0-15 Units Subcutaneous TID WC  . insulin aspart  0-5 Units Subcutaneous QHS  . insulin aspart  4 Units Subcutaneous TID WC  . insulin glargine  30 Units  Subcutaneous Daily  . ipratropium  500 mcg Nebulization TID  . irbesartan  300 mg Oral Daily  . multivitamin with minerals  1 tablet Oral Daily  . nebivolol  10 mg Oral Daily  . pantoprazole  40 mg Oral Daily  . Warfarin - Pharmacist Dosing Inpatient   Does not apply q1800   Continuous Infusions: . sodium chloride 1,000 mL (12/20/12 0734)    Principal Problem:   DKA (diabetic ketoacidoses) Active Problems:   BRADYCARDIA-TACHYCARDIA SYNDROME   COPD (chronic obstructive pulmonary disease)   Ataxia   Hyperkalemia   Leukocytosis, unspecified   Thrombocytopenia, unspecified   Hyponatremia    Time spent: 30 min    Manpreet Kemmer  [facility] Pager [phone]. If 7PM-7AM, please contact night-coverage at [email or web address], password [email or web address] 12/20/2012, 7:59 AM  LOS: 2 days

## 2012-12-20 NOTE — Progress Notes (Signed)
Patient ID: [name], female   DOB: Apr 20, 1951, 62 y.o.   MRN: [id number]   CARDIOLOGY CONSULT NOTE  Patient ID: [name] MRN: [id number], DOB/AGE: 08-13-1951   Admit date: 12/18/2012 Date of Consult: 12/20/2012   Primary Physician: Ernestine Conrad, MD Primary Cardiologist: Lewayne Bunting  Pt. Profile  62 year old white female admitted with multiple metabolic abnormalities now with recurrent atrial fibrillation with rapid ventricular rate. We are asked by Dr Malachi Bonds to evaluate and help manage.  Problem List  Past Medical History  Diagnosis Date  . Atrial fibrillation   . Coronary atherosclerosis of native coronary artery     Nonobstructive, LVEF 60%  . COPD (chronic obstructive pulmonary disease)   . Mixed hyperlipidemia   . Essential hypertension, benign   . Tachycardia-bradycardia syndrome   . Embolism - blood clot   . SOB (shortness of breath) on exertion   . Allergy     Latex  . Asthma   . Anxiety   . Myocardial infarction     12 years ago ?  [name] History of radiation therapy     59 gy fraction 5 eot 07/09/12 right lung  . Lung cancer 04/29/12 dx    RLL needle bx=invasive well diff adenocarcinoma  [facility]    Past Surgical History  Procedure Laterality Date  . Appendectomy    . Insert / replace / remove pacemaker  3/11     [employer] - Dr. [name]  . Video bronchoscopy  02/22/2012    Procedure: VIDEO BRONCHOSCOPY WITH FLUORO;  Surgeon: Nyoka Cowden, MD;  Location: [facility];  Service: Cardiopulmonary;  Laterality: Bilateral;  . Lung biopsy  04/29/12    RLL invasive well diff adenocarcinoma  . Lung biopsy  02/22/12    RML=benign lung parenchyma,no tumor sen  . Tonsillectomy and adenoidectomy      age 22  . Total abdominal hysterectomy  1975    1/2 right ovary intact, left salpingo-oppherctomy  . Mr brain w wo contrast  11/17/2012          Allergies  Allergies  Allergen Reactions  . Latex     "pulls skin off"    HPI   Patient has a history of paroxysmal  atrial fib controlled with flecainide. She was last evaluated by Dr. [name] in our pacer clinic on January 31. At that time interrogation of her pacemaker showed normal function and that she's been in normal sinus rhythm 99% of the time.  She was admitted with impending diabetic ketoacidosis with severe hyperglycemia exacerbated by steroids. She is undergoing chemotherapy which she just started this week for adenocarcinoma of the lung. She's finished brain radiation as well.  She's totally asymptomatic. Current heart rates about 140. She is received 2 doses of IV Lopressor with heart rate slowing only into  the 120s. She's been on anticoagulation as an outpatient . INR is 4. She is received flecainide today.  Hemodynamics otherwise are stable.  Inpatient Medications  . albuterol  2.5 mg Nebulization TID  . aspirin EC  81 mg Oral Daily  . atorvastatin  80 mg Oral q1800  . dexamethasone  4 mg Oral BID WC  . dextromethorphan-guaiFENesin  1 tablet Oral BID  . diltiazem  10 mg Intravenous Once  . feeding supplement  237 mL Oral BID BM  . flecainide  100 mg Oral BID  . folic acid  1 mg Oral Daily  . insulin aspart  0-15 Units Subcutaneous TID WC  . insulin aspart  0-5 Units Subcutaneous QHS  . insulin aspart  8 Units Subcutaneous TID WC  . insulin glargine  30 Units Subcutaneous Daily  . ipratropium  500 mcg Nebulization TID  . irbesartan  300 mg Oral Daily  . multivitamin with minerals  1 tablet Oral Daily  . nebivolol  10 mg Oral Daily  . pantoprazole  40 mg Oral Daily  . Warfarin - Pharmacist Dosing Inpatient   Does not apply q1800    Family History Family History  Problem Relation Age of Onset  . Hypertension Mother   . Emphysema Brother     smoker  . Asthma Father   . Asthma Brother   . Lung cancer Paternal Grandmother     was a smoker  . Cancer Maternal Grandmother     lung, dx early 52's, died 3 days after lobectomy     Social History History   Social History  .  Marital Status: Widowed    Spouse Name: N/A    Number of Children: 1  . Years of Education: N/A   Occupational History  . Unemployed    Social History Main Topics  . Smoking status: Former Smoker -- 0.25 packs/day for 43 years    Types: Cigarettes    Quit date: 10/08/2012  . Smokeless tobacco: Former Neurosurgeon     Comment: quit 2 weeks ago after smoking since age 89. Up to 1 pack a day  . Alcohol Use: No  . Drug Use: No     Comment: will quit labor day stated  patient 06/09/12  . Sexually Active: No     Comment: menses age 10, first pregnamcy age 62, HRT 4-5 years,premarin,stopped 2000 or 2001   Other Topics Concern  . Not on file   Social History Narrative  . No narrative on file     Review of Systems  General:  No chills, fever, night sweats or weight changes.  Cardiovascular:  No chest pain, dyspnea on exertion, edema, orthopnea, palpitations, paroxysmal nocturnal dyspnea. Dermatological: No rash, lesions/masses Respiratory: No cough, dyspnea Urologic: No hematuria, dysuria Abdominal:   No nausea, vomiting, diarrhea, bright red blood per rectum, melena, or hematemesis Neurologic:  No visual changes, wkns, changes in mental status. All other systems reviewed and are otherwise negative except as noted above.  Physical Exam  Blood pressure 122/74, pulse 124, temperature 98.4 F (36.9 C), temperature source Oral, resp. rate 22, height 5\' 2"  (1.575 m), weight 169 lb 12.1 oz (77 kg), SpO2 96.00%.  General: Pleasant, NAD Psych: Normal affect. Says she feels great and wants to go home Neuro: Alert and oriented X 3. Moves all extremities spontaneously. HEENT: Normal, balding Neck: Supple without bruits or JVD. Lungs:  Resp regular and unlabored, CTA. Heart: Rapid irregular rate and rhythm, no s3, s4, or murmurs. Abdomen: Soft, non-tender, non-distended, BS + x 4.  Extremities: No clubbing, cyanosis or edema. DP/PT/Radials 2+ and equal bilaterally.  Labs  No results found for  this basename: CKTOTAL, CKMB, TROPONINI,  in the last 72 hours Lab Results  Component Value Date   WBC 16.4* 12/20/2012   HGB 12.0 12/20/2012   HCT 34.4* 12/20/2012   MCV 90.8 12/20/2012   PLT 103* 12/20/2012    Recent Labs Lab 12/18/12 1023  12/20/12 0410  NA 124*  < > 127*  K 5.3*  < > 4.6  CL 93*  < > 97  CO2 22  < > 21  BUN 43.0*  < > 35*  CREATININE 1.2*  < >  0.72  CALCIUM 8.4  < > 8.2*  PROT 5.6*  --   --   BILITOT 0.86  --   --   ALKPHOS 154*  --   --   ALT 64*  --   --   AST 22  --   --   GLUCOSE 710 Repeated and Verified*  < > 382*  < > = values in this interval not displayed. Lab Results  Component Value Date   CHOL 116 09/19/2009   HDL 41 09/19/2009   LDLCALC 46 09/19/2009   TRIG 143 09/19/2009   Lab Results  Component Value Date   DDIMER  Value: 0.22        AT THE INHOUSE ESTABLISHED CUTOFF VALUE OF 0.48 ug/mL FEU, THIS ASSAY HAS BEEN DOCUMENTED IN THE LITERATURE TO HAVE A SENSITIVITY AND NEGATIVE PREDICTIVE VALUE OF AT LEAST 98 TO 99%.  THE TEST RESULT SHOULD BE CORRELATED WITH AN ASSESSMENT OF THE CLINICAL PROBABILITY OF DVT / VTE. 03/31/2009    Radiology/Studies  Dg Chest 2 View  12/18/2012  *RADIOLOGY REPORT*  Clinical Data: Cough, weakness, shortness of breath  CHEST - 2 VIEW  Comparison: 04/29/2012  Findings: Left subclavian sequential transvenous pacemaker leads project within the right atrium and right ventricle. Enlargement of cardiac silhouette. Mediastinal contours and pulmonary vascularity normal. Interval decrease in size of nodular density at lower right chest, now 15 x 11 mm, previously 37 x 36 mm. Minimal right basilar atelectasis. Remaining lungs clear. No pleural effusion or pneumothorax. No acute osseous findings.  IMPRESSION: Interval decrease in size of nodular density lower right chest. Enlargement of cardiac silhouette. Minimal right basilar atelectasis.   Original Report Authenticated By: Ulyses Southward, M.D.     ECG  Paroxysmal A. fib with a  rapid ventricular rate, ST segment depression inferior laterally which is new.  ASSESSMENT AND PLAN #1 paroxysmal A. fib with a rapid ventricular rate. She has a history of this which is been well controlled with flecainide and backup pacer for tachybradycardia syndrome. Metabolic abnormalities most likely have triggered this event not to mention the stress of chemotherapy and other ongoing issues. Would recommend diltiazem 20 mg IV then 10 mg per hour titrate to a heart rate of less than 90. Hopefully she will convert on her own as her metabolic parameters and other stresses resolve. Continue flecainide and anticoagulation per pharmacy. I'm not sure an echocardiogram will be of any clinical value as it will not change our clinical management. She had very good LV function in the past with left atrial enlargement and right ventricular hypertrophy. #2 nonobstructive coronary artery disease  Other problems as outlined in the problem list. Problem list updated by me.  We'll follow with you.    Signed, Valera Castle, MD 12/20/2012, 1:37 PM

## 2012-12-21 DIAGNOSIS — R7309 Other abnormal glucose: Secondary | ICD-10-CM

## 2012-12-21 DIAGNOSIS — E871 Hypo-osmolality and hyponatremia: Secondary | ICD-10-CM

## 2012-12-21 LAB — BASIC METABOLIC PANEL
Calcium: 8.4 mg/dL (ref 8.4–10.5)
GFR calc Af Amer: 82 mL/min — ABNORMAL LOW (ref >90)
GFR calc non Af Amer: 71 mL/min — ABNORMAL LOW (ref >90)
Glucose, Bld: 281 mg/dL — ABNORMAL HIGH (ref 70–99)
Sodium: 130 mEq/L — ABNORMAL LOW (ref 135–145)

## 2012-12-21 LAB — GLUCOSE, CAPILLARY
Glucose-Capillary: 262 mg/dL — ABNORMAL HIGH (ref 70–99)
Glucose-Capillary: 202 mg/dL — ABNORMAL HIGH (ref 70–99)

## 2012-12-21 LAB — PROTIME-INR
INR: 3.45 — ABNORMAL HIGH (ref 0.00–1.49)
Prothrombin Time: 32.8 seconds — ABNORMAL HIGH (ref 11.6–15.2)

## 2012-12-21 LAB — CBC
MCH: 31 pg (ref 26.0–34.0)
Platelets: 102 10*3/uL — ABNORMAL LOW (ref 150–400)
RBC: 3.68 MIL/uL — ABNORMAL LOW (ref 3.87–5.11)
WBC: 13 10*3/uL — ABNORMAL HIGH (ref 4.0–10.5)

## 2012-12-21 MED ORDER — INSULIN ASPART 100 UNIT/ML ~~LOC~~ SOLN
5.0000 [IU] | Freq: Three times a day (TID) | SUBCUTANEOUS | Status: DC
  Administered 2012-12-21 (×3): 5 [IU] via SUBCUTANEOUS

## 2012-12-21 MED ORDER — INSULIN GLARGINE 100 UNIT/ML ~~LOC~~ SOLN
35.0000 [IU] | Freq: Every day | SUBCUTANEOUS | Status: DC
  Administered 2012-12-21: 35 [IU] via SUBCUTANEOUS

## 2012-12-21 NOTE — Discharge Summary (Signed)
Physician Discharge Summary  [name] ZOX:[id number] DOB: 10-04-1951 DOA: 12/18/2012  PCP: Ernestine Conrad, MD  Admit date: 12/18/2012 Discharge date: 12/23/2012  Recommendations for Outpatient Follow-up:  1. Follow up with primary care doctor within 10 days of discharge to review fingersticks and make further adjustments as needed.  Needs further DM management such as opthalmology, podiatry, urine studies, etc, as indicated.   2. Dexamethasone taper:  2mg  BID 3/17 > 3/30, 2mg  DAILY 3/31 > 4/13. 3. INR check on Wednesday or Thursday 4. Repeat electrolytes at follow up appointment and thyroid function tests in about 4 weeks when well.    Discharge Diagnoses:  Principal Problem:   DKA (diabetic ketoacidoses) Active Problems:   BRADYCARDIA-TACHYCARDIA SYNDROME   COPD (chronic obstructive pulmonary disease)   Ataxia   Hyperkalemia   Leukocytosis, unspecified   Thrombocytopenia, unspecified   Hyponatremia   Hyperglycemia   Acute exacerbation of COPD with asthma   Discharge Condition: stable, improved  Diet recommendation: diabetic, heart healthy diet  Wt Readings from Last 3 Encounters:  12/21/12 77.111 kg (170 lb)  12/11/12 78.79 kg (173 lb 11.2 oz)  12/04/12 75.524 kg (166 lb 8 oz)    History of present illness:   [name] is a 62 y.o. female with history of metastatic non-small cell lung cancer who was started on chemotherapy and was placed on Decadron from yesterday was found to be hyperglycemic the sugars around 700. Patient was referred to the ER where in blood work showed sugars to be more than 500 with anion gap and acetone positive. Patient has not had diabetes previously. Presently patient has been started on IV insulin with fluids and has been admitted for diabetic ketoacidosis. Patient otherwise denies any chest pain shortness of breath nausea vomiting diarrhea fever chills.  Hospital Course:   [name] was hospitalized with DKA due to steroid induce  hyperglycemia.  She was placed on an insulin gtt which was transitioned to subcutaneous insulin.  She had a higher insulin requirement than estimated based on her insulin gtt and required increases in her subcutaneous insulin daily. Her hemoglobin was A1c 9.6.  She was given education about diabetic diet, checking fingersticks, hypoglycemia, and administering insulin.  She should continue novolog 70/30 28 units BID.    A. fib and tachybradycardia syndrome:  She converted to atrial fibrillation with RVR on 3/15 and was seen by cardiology and put on a diltiazem gtt.  She converted to atrial paced rhythm a few hours later and her gtt was later discontinued.  No oral medication was started other than continuing her previous dose of beta blocker and flecainide.  Her INR was elevated initially without signs of bleeding and it trended down.  She should continue 2.5mg  as recommended by pharmacy and have her INR checked on 3/19 or 3/20.    Leukocytosis, likely reactive from DKA, but rose slightly today but baseline is elevated.  Urinalysis and CXR were negative for infection.    Anemia and thrombocytopenia, likely related to acute illness in setting of metastatic cancer and stable.   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 Would recheck if sodium still low after fingersticks stable for a while.   History of PE - Coumadin per pharmacy.    COPD -  She had some bilateral wheezing which responded to duonebs.  She continued mucinex and tessalon   Hypertension - stable.  Continue home medications.   Hyperlipidemia -  stable.  Continued statins.   Metastatic lung cancer - per oncology.  Has been on dexamethasone taper for brain mets and had evidence of adrenal insufficiency when she stopped taking her medication briefly in February.  Should continue taper as above and be monitored when discontinuing altogether for signs of adrenal  insufficiency.    Abnl TSH:  Free T3 low, Free T4 wnl (but lower end of normal).  DDx includes central hypothyroid, sick euthyroid.  Repeat TFTs 4 weeks post discharge.     Consultants:  Cardiology Procedures:  Insulin gtt dilt gtt Antibiotics:  none   Discharge Exam: Filed Vitals:   12/23/12 0538  BP: 118/28  Pulse: 65  Temp: 98.2 F (36.8 C)  Resp: 16   Filed Vitals:   12/22/12 1450 12/22/12 1504 12/22/12 2122 12/23/12 0538  BP:  107/49 128/57 118/28  Pulse:  63 67 65  Temp:  98.5 F (36.9 C) 98.3 F (36.8 C) 98.2 F (36.8 C)  TempSrc:  Oral Oral Oral  Resp:  18 18 16   Height:      Weight:      SpO2: 98% 97% 97% 96%   Patient feeling well and is waiting for her ride.    General: CF, No acute distress  HEENT: NCAT, MMM  Cardiovascular: RRR, nl S1, S2 no mrg, 2+ pulses, warm extremities  Respiratory:  Mild wheezing bilaterally.  No rales or rhonchi  Abdomen: NABS, soft, NT/ND, central obesity  MSK: Normal tone and bulk, no LEE  Neuro: Grossly intact   Discharge Instructions      Discharge Orders   Future Appointments Provider Department Dept Phone   12/25/2012 9:45 AM [facility] MED[facility] [phone]   12/25/2012 10:15 AM [place] [facility] [phone]   01/01/2013 10:00 AM [facility] [name] [facility] [phone]   01/08/2013 10:45 AM [facility] [place] [facility] [phone]   01/15/2013 9:45 AM Dava Najjar [place] [facility] MED[facility] ONCOL[facility]Y [phone]   03/25/2013 1:30 PM Jonna Coup, MD [place] [facility] RADIATION ONCOLOGY [phone]   Future Orders Complete By Expires     Call MD for:  difficulty breathing, headache or visual disturbances  As directed     Call MD for:  extreme fatigue  As directed     Call MD for:  hives  As directed     Call MD for:  persistant dizziness or  light-headedness  As directed     Call MD for:  persistant nausea and vomiting  As directed     Call MD for:  severe uncontrolled pain  As directed     Call MD for:  temperature >100.4  As directed     Diet - low sodium heart healthy  As directed     Diet Carb Modified  As directed     Discharge instructions  As directed     Comments:      You were hospitalized with diabetic ketoacidosis (DKA) due to steroids causing you to become resistant to insulin.  You should start insulin Novolog 70/30 28 units twice daily and record your blood sugars four times a day, before breakfast, before lunch, before dinner, and before bed.  Please follow up with your primary care doctor within 10 days to review your fingersticks.  Please review the instructions regarding what to do with low and high fingersticks.  Please  talk to your doctor about what to do with your insulin each time you taper your dexamethasone.  You should continue dexamethasone 4mg  tabs, 1/2 tab twice daily through March 30th, then reduce to 1/2 tab once daily for two weeks, last day on 4/13, then stop.  You will need to reduce your insulin with each reduction in steroid medication.  You had some atrial fibrillation with rapid ventricular response (RVR) which responded to diltiazem.  Please continue your home heart medications as before.  Finally, your thyroid function may have been affected by your DKA and should be repeated by your primary care doctor in a few weeks.  Finally, your INR was elevated to 4 initially and came down to 2 after a few days.  Please continue warfarin 2.5mg  once daily and have your INR checked on Thursday or Friday so further adjustments can be made to your warfarin as needed.    Increase activity slowly  As directed         Medication List    TAKE these medications       albuterol (2.5 MG/3ML) 0.083% nebulizer solution  Commonly known as:  PROVENTIL  Take 2.5 mg by nebulization every 6 (six) hours as needed for wheezing  or shortness of breath.     PROAIR HFA 108 (90 BASE) MCG/ACT inhaler  Generic drug:  albuterol  Inhale 2 puffs into the lungs every 6 (six) hours as needed for wheezing or shortness of breath.     aspirin EC 81 MG tablet  Take 81 mg by mouth daily.     benzonatate 100 MG capsule  Commonly known as:  TESSALON  Take 1 capsule (100 mg total) by mouth 3 (three) times daily as needed for cough.     Blood Glucose Meter kit  Use as instructed     BYSTOLIC 10 MG tablet  Generic drug:  nebivolol  Take 10 mg by mouth daily.     dexamethasone 4 MG tablet  Commonly known as:  DECADRON  Take 0.5 tablets (2 mg total) by mouth 2 (two) times daily with a meal.     dextromethorphan-guaiFENesin 30-600 MG per 12 hr tablet  Commonly known as:  MUCINEX DM  Take 1 tablet by mouth 2 (two) times daily.     flecainide 100 MG tablet  Commonly known as:  TAMBOCOR  Take 1 tablet (100 mg total) by mouth 2 (two) times daily.     folic acid 1 MG tablet  Commonly known as:  FOLVITE  Take 1 tablet (1 mg total) by mouth daily.     glucose 4 GM chewable tablet  Chew 4 tablets (16 g total) by mouth as needed for low blood sugar.     insulin aspart protamine-insulin aspart (70-30) 100 UNIT/ML injection  Commonly known as:  NOVOLOG MIX 70/30 FLEXPEN  Inject 28 Units into the skin 2 (two) times daily with a meal.     ipratropium 0.02 % nebulizer solution  Commonly known as:  ATROVENT  Take 500 mcg by nebulization 4 (four) times daily as needed for wheezing (shortness of breath).     Lancet Device Misc  1 Device by Does not apply route 4 (four) times daily -  before meals and at bedtime.     meclizine 25 MG tablet  Commonly known as:  ANTIVERT  Take 25 mg by mouth 3 (three) times daily as needed.     MELATONIN ER PO  Take 5 mg by mouth at bedtime.  oxyCODONE-acetaminophen 10-325 MG per tablet  Commonly known as:  PERCOCET  Take 1 tablet by mouth every 8 (eight) hours as needed for pain. Takes  once daily  And prn for knees     pantoprazole 40 MG tablet  Commonly known as:  PROTONIX  Take 1 tablet (40 mg total) by mouth daily.     potassium chloride SA 20 MEQ tablet  Commonly known as:  KLOR-CON M20  Take 1 tablet (20 mEq total) by mouth daily.     rosuvastatin 40 MG tablet  Commonly known as:  CRESTOR  Take 1 tablet (40 mg total) by mouth daily.     TRIBENZOR 40-5-25 MG Tabs  Generic drug:  Olmesartan-Amlodipine-HCTZ  take 1 tablet once daily     warfarin 5 MG tablet  Commonly known as:  COUMADIN  Take 0.5 tablets (2.5 mg total) by mouth daily.       Follow-up Information   Follow up with Ernestine Conrad, MD In 1 week.   Contact information:   [facility] [address] [place] [address] [phone]       Follow up with INR. Schedule an appointment as soon as possible for a visit on 12/25/2012.      Follow up with Lajuana Matte., MD. (at already scheduled appointment)    Contact information:   [address] [place] [address] [phone]       The results of significant diagnostics from this hospitalization (including imaging, microbiology, ancillary and laboratory) are listed below for reference.    Significant Diagnostic Studies: Dg Chest 2 View  12/18/2012  *RADIOLOGY REPORT*  Clinical Data: Cough, weakness, shortness of breath  CHEST - 2 VIEW  Comparison: 04/29/2012  Findings: Left subclavian sequential transvenous pacemaker leads project within the right atrium and right ventricle. Enlargement of cardiac silhouette. Mediastinal contours and pulmonary vascularity normal. Interval decrease in size of nodular density at lower right chest, now 15 x 11 mm, previously 37 x 36 mm. Minimal right basilar atelectasis. Remaining lungs clear. No pleural effusion or pneumothorax. No acute osseous findings.  IMPRESSION: Interval decrease in size of nodular density lower right chest. Enlargement of cardiac silhouette. Minimal right basilar  atelectasis.   Original Report Authenticated By: Ulyses Southward, M.D.     Microbiology: Recent Results (from the past 240 hour(s))  MRSA PCR SCREENING     Status: None   Collection Time    12/18/12 10:03 PM      Result Value Range Status   MRSA by PCR NEGATIVE  NEGATIVE Final   Comment:            The GeneXpert MRSA Assay (FDA     approved for NASAL specimens     only), is one component of a     comprehensive MRSA colonization     surveillance program. It is not     intended to diagnose MRSA     infection nor to guide or     monitor treatment for     MRSA infections.     Labs: Basic Metabolic Panel:  Recent Labs Lab 12/19/12 1728 12/20/12 0410 12/21/12 0344 12/22/12 0419 12/23/12 0454  NA 127* 127* 130* 131* 134*  K 4.7 4.6 5.1 4.2 4.0  CL 96 97 97 98 99  CO2 22 21 28 26 27   GLUCOSE 439* 382* 281* 209* 189*  BUN 36* 35* 27* 26* 27*  CREATININE 0.70 0.72 0.86 0.71 0.68  CALCIUM 8.2* 8.2* 8.4 8.3* 8.4  Liver Function Tests:  Recent Labs Lab 12/18/12 1023  AST 22  ALT 64*  ALKPHOS 154*  BILITOT 0.86  PROT 5.6*  ALBUMIN 3.0*   No results found for this basename: LIPASE, AMYLASE,  in the last 168 hours No results found for this basename: AMMONIA,  in the last 168 hours CBC:  Recent Labs Lab 12/18/12 1023  12/18/12 2230 12/20/12 0410 12/21/12 0344 12/22/12 0419 12/23/12 0454  WBC 21.4*  < > 13.9* 16.4* 13.0* 12.6* 13.4*  NEUTROABS 20.0*  --   --   --   --   --   --   HGB 14.4  < > 13.2 12.0 11.4* 12.3 12.5  HCT 41.2  < > 37.1 34.4* 33.3* 35.0* 35.8*  MCV 90.4  < > 89.4 90.8 90.5 90.7 90.9  PLT 182  < > 132* 103* 102* 107* 124*  < > = values in this interval not displayed. Cardiac Enzymes: No results found for this basename: CKTOTAL, CKMB, CKMBINDEX, TROPONINI,  in the last 168 hours BNP: BNP (last 3 results) No results found for this basename: PROBNP,  in the last 8760 hours CBG:  Recent Labs Lab 12/22/12 0742 12/22/12 1142 12/22/12 1653  12/22/12 2120 12/23/12 0728  GLUCAP 203* 217* 183* 136* 166*    Time coordinating discharge: 45 minutes  Signed:  Furman Trentman  [facility] 12/23/2012, 8:43 AM

## 2012-12-21 NOTE — Progress Notes (Signed)
ANTICOAGULATION CONSULT NOTE - Follow Up Consult  Pharmacy Consult for Warfarin Indication: Afib, Hx PE  Allergies  Allergen Reactions  . Latex     "pulls skin off"    Patient Measurements: Height: 5\' 2"  (157.5 cm) Weight: 169 lb 12.1 oz (77 kg) IBW/kg (Calculated) : 50.1 ~79kg  Vital Signs: Temp: 98.3 F (36.8 C) (03/16 0400) Temp src: Oral (03/16 0400) BP: 127/47 mmHg (03/16 0830) Pulse Rate: 73 (03/16 0900)  Labs:  Recent Labs  12/18/12 2230  12/19/12 0228  12/19/12 1728 12/20/12 0410 12/21/12 0344  HGB 13.2  --   --   --   --  12.0 11.4*  HCT 37.1  --   --   --   --  34.4* 33.3*  PLT 132*  --   --   --   --  103* 102*  LABPROT  --   --  27.4*  --   --  37.0* 32.8*  INR  --   --  2.71*  --   --  4.06* 3.45*  CREATININE 0.67  < > 0.67  < > 0.70 0.72 0.86  < > = values in this interval not displayed.  Estimated Creatinine Clearance: 65.2 ml/min (by C-G formula based on Cr of 0.86).   Medications:  Scheduled:  . albuterol  2.5 mg Nebulization TID  . aspirin EC  81 mg Oral Daily  . atorvastatin  80 mg Oral q1800  . dexamethasone  4 mg Oral BID WC  . dextromethorphan-guaiFENesin  1 tablet Oral BID  . [COMPLETED] diltiazem  10 mg Intravenous Once  . [COMPLETED] diltiazem  10 mg Intravenous Once  . feeding supplement  237 mL Oral BID BM  . flecainide  100 mg Oral BID  . folic acid  1 mg Oral Daily  . insulin aspart  0-15 Units Subcutaneous TID WC  . insulin aspart  0-5 Units Subcutaneous QHS  . insulin aspart  5 Units Subcutaneous TID WC  . insulin glargine  35 Units Subcutaneous Daily  . ipratropium  500 mcg Nebulization TID  . irbesartan  300 mg Oral Daily  . [COMPLETED] metoprolol      . [COMPLETED] metoprolol  5 mg Intravenous Once  . [COMPLETED] metoprolol  5 mg Intravenous Once  . multivitamin with minerals  1 tablet Oral Daily  . nebivolol  10 mg Oral Daily  . pantoprazole  40 mg Oral Daily  . Warfarin - Pharmacist Dosing Inpatient   Does not  apply q1800  . [DISCONTINUED] amLODipine  5 mg Oral Daily  . [DISCONTINUED] diltiazem  10 mg Intravenous Once  . [DISCONTINUED] insulin aspart  4 Units Subcutaneous TID WC  . [DISCONTINUED] insulin aspart  4 Units Subcutaneous TID WC  . [DISCONTINUED] insulin aspart  8 Units Subcutaneous TID WC  . [DISCONTINUED] insulin glargine  30 Units Subcutaneous Daily   Infusions:  . sodium chloride 50 mL/hr at 12/20/12 2205  . [DISCONTINUED] diltiazem (CARDIZEM) infusion 5 mg/hr (12/20/12 1950)    Assessment: 43 yoF with NSC Lung Ca received chemo 3/13 at [facility] (Pemetrexed/Carbo). Found to be in DKA  Chronic warfarin therapy for Afib and Hx of PE  [facility] note re Warfarin states dose adjusted to 2.5mg  daily except 5mg  on Tues which is different from patient stated dosing of 2.5mg  Tu,Th,Sa & 5 mg other days.   INR remains supratherapeutic (3.45) but decreased from yesterday  H/H essentially stable, Pltc falling as expected s/p chemo, no bleeding/complications reported.  Goal of  Therapy:  INR 2-3   Plan:   Hold warfarin again today  Daily PT/INR  Reinforce dosing instructions prior to discharge  Loralee Pacas, PharmD, BCPS Pager: [phone] 12/21/2012,9:42 AM

## 2012-12-21 NOTE — Progress Notes (Addendum)
[facility] PROGRESS NOTE  [name] NWG:[id number] DOB: 1951-01-10 DOA: 12/18/2012 PCP: Ernestine Conrad, MD  Assessment/Plan  DKA due to steroid induce hyperglycemia resolved, fingersticks continuing to trend down.  AM fingerstick still elevated. -  Hemoglobin A1c 9.6 -  Increase to Lantus 35 units -  Increase to 5 units aspart with meals -  Continue moderate SSI aspart with meals.   -  Cont qhs insulin -  Teaching -  Will need home diabetes supplies and further education today  A. fib and tachybradycardia syndrome and RVR yesterday.  Converted to atrial paced rhythm yesterday afternoon and dilt gtt d/c'd around 2am.  Has remained atrial paced on tele.  INR elevated but trending down.   -  Defer oral medication management to cardiology   - Coumadin dosing per pharmacy.   Leukocytosis, likely reactive from DKA, but rose slightly today but baseline is elevated. -  Monitor for signs of infection  Anemia and thrombocytopenia, likely related to acute illness in setting of metastatic cancer and stable. -  Trend CBC.  Hyponatremia - probably from dehydration and hyperglycemia. Since patient also has lung cancer possibilities would include SIADH.  -  Urine sodium 90 and osmolality 436, likely hyperglycemia induce diuresis and possibly some underlying SIADH.  Would recheck if sodium still low after fingersticks stable for a while.    History of PE - Coumadin per pharmacy.   COPD - No wheezing on left side today.   Residual wheeze on right side likely from malignancy.   -  Continue duonebs TID with albuterol -  Cont mucinex and tessalon  Hypertension - stable.  -  continue home medications.   Hyperlipidemia - stable - continue statins.   Metastatic lung cancer - per oncology.  Abnl TSH: -  Free T3, Free T4  Diet:  Diabetic ACCESS:  PIV IVF:  OFF Proph:  Therapeutic warfarin  Code Status: full  Family Communication: spoke with pt alone Disposition Plan: transfer to  telemetry   Consultants:  none  Procedures:  none  Antibiotics:  none   HPI/Subjective:  Feels well.  Denies HA, CP, SOB, N/V/D/C, abdominal pain.    Objective: Filed Vitals:   12/21/12 0400 12/21/12 0500 12/21/12 0600 12/21/12 0700  BP: 121/54 131/47 123/49 110/52  Pulse: 60 64 61 66  Temp: 98.3 F (36.8 C)     TempSrc: Oral     Resp: 13 15 14 14   Height:      Weight:      SpO2: 94% 95% 95% 95%    Intake/Output Summary (Last 24 hours) at 12/21/12 0802 Last data filed at 12/21/12 0445  Gross per 24 hour  Intake 1317.5 ml  Output   1875 ml  Net -557.5 ml   Filed Weights   12/19/12 0800  Weight: 77 kg (169 lb 12.1 oz)    Exam:   General:  CF, No acute distress  HEENT:  NCAT, MMM  Cardiovascular:  RRR, nl S1, S2 no mrg, 2+ pulses, warm extremities  Respiratory:  Wheezing on right side.  Diminished on left.  No rales or rhonchi  Abdomen:   NABS, soft, NT/ND, central obesity  MSK:   Normal tone and bulk, no LEE  Neuro:  Grossly intact  Data Reviewed: Basic Metabolic Panel:  Recent Labs Lab 12/19/12 0346 12/19/12 1150 12/19/12 1728 12/20/12 0410 12/21/12 0344  NA 130* 127* 127* 127* 130*  K 5.7* 4.7 4.7 4.6 5.1  CL 100 96 96 97 97  CO2 21 25 22 21 28   GLUCOSE 171* 259* 439* 382* 281*  BUN 32* 31* 36* 35* 27*  CREATININE 0.54 0.62 0.70 0.72 0.86  CALCIUM 8.2* 8.6 8.2* 8.2* 8.4   Liver Function Tests:  Recent Labs Lab 12/18/12 1023  AST 22  ALT 64*  ALKPHOS 154*  BILITOT 0.86  PROT 5.6*  ALBUMIN 3.0*   No results found for this basename: LIPASE, AMYLASE,  in the last 168 hours No results found for this basename: AMMONIA,  in the last 168 hours CBC:  Recent Labs Lab 12/18/12 1023 12/18/12 2115 12/18/12 2230 12/20/12 0410 12/21/12 0344  WBC 21.4* 15.0* 13.9* 16.4* 13.0*  NEUTROABS 20.0*  --   --   --   --   HGB 14.4 12.6 13.2 12.0 11.4*  HCT 41.2 35.0* 37.1 34.4* 33.3*  MCV 90.4 89.1 89.4 90.8 90.5  PLT 182 114* 132*  103* 102*   Cardiac Enzymes: No results found for this basename: CKTOTAL, CKMB, CKMBINDEX, TROPONINI,  in the last 168 hours BNP (last 3 results) No results found for this basename: PROBNP,  in the last 8760 hours CBG:  Recent Labs Lab 12/19/12 2221 12/20/12 0733 12/20/12 1143 12/20/12 1623 12/20/12 2143  GLUCAP 383* 316* 282* 165* 251*    Recent Results (from the past 240 hour(s))  MRSA PCR SCREENING     Status: None   Collection Time    12/18/12 10:03 PM      Result Value Range Status   MRSA by PCR NEGATIVE  NEGATIVE Final   Comment:            The GeneXpert MRSA Assay (FDA     approved for NASAL specimens     only), is one component of a     comprehensive MRSA colonization     surveillance program. It is not     intended to diagnose MRSA     infection nor to guide or     monitor treatment for     MRSA infections.     Studies: No results found.  Scheduled Meds: . albuterol  2.5 mg Nebulization TID  . aspirin EC  81 mg Oral Daily  . atorvastatin  80 mg Oral q1800  . dexamethasone  4 mg Oral BID WC  . dextromethorphan-guaiFENesin  1 tablet Oral BID  . feeding supplement  237 mL Oral BID BM  . flecainide  100 mg Oral BID  . folic acid  1 mg Oral Daily  . insulin aspart  0-15 Units Subcutaneous TID WC  . insulin aspart  0-5 Units Subcutaneous QHS  . insulin aspart  5 Units Subcutaneous TID WC  . insulin glargine  35 Units Subcutaneous Daily  . ipratropium  500 mcg Nebulization TID  . irbesartan  300 mg Oral Daily  . multivitamin with minerals  1 tablet Oral Daily  . nebivolol  10 mg Oral Daily  . pantoprazole  40 mg Oral Daily  . Warfarin - Pharmacist Dosing Inpatient   Does not apply q1800   Continuous Infusions: . sodium chloride 50 mL/hr at 12/20/12 2205  . diltiazem (CARDIZEM) infusion 5 mg/hr (12/20/12 1950)    Principal Problem:   DKA (diabetic ketoacidoses) Active Problems:   BRADYCARDIA-TACHYCARDIA SYNDROME   COPD (chronic obstructive  pulmonary disease)   Ataxia   Hyperkalemia   Leukocytosis, unspecified   Thrombocytopenia, unspecified   Hyponatremia   Hyperglycemia   Acute exacerbation of COPD with asthma    Time spent: 30 min  Renae Fickle  [facility] Pager [phone]. If 7PM-7AM, please contact night-coverage at [email or web address], password [facility] 12/21/2012, 8:02 AM  LOS: 3 days

## 2012-12-21 NOTE — Progress Notes (Signed)
SUBJECTIVE:  Converted to atrial paced rhythm yesterday afternoon  OBJECTIVE:   Vitals:   Filed Vitals:   12/21/12 0830 12/21/12 0847 12/21/12 0900 12/21/12 1142  BP: 127/47   114/59  Pulse: 69  73 69  Temp:      TempSrc:      Resp: 17  17 18   Height:    5\' 2"  (1.575 m)  Weight:    77.111 kg (170 lb)  SpO2: 93% 98% 96% 95%   I&O's:   Intake/Output Summary (Last 24 hours) at 12/21/12 1321 Last data filed at 12/21/12 0900  Gross per 24 hour  Intake  347.5 ml  Output   2225 ml  Net -1877.5 ml   TELEMETRY: Reviewed telemetry pt in atrial paced rhythm     PHYSICAL EXAM General: Well developed, well nourished, in no acute distress Head: Eyes PERRLA, No xanthomas.   Normal cephalic and atramatic  Lungs:   Clear bilaterally to auscultation and percussion. Heart:   HRRR S1 S2 Pulses are 2+ & equal. Abdomen: Bowel sounds are positive, abdomen soft and non-tender without masses  Extremities:   No clubbing, cyanosis or edema.  DP +1 Neuro: Alert and oriented X 3. Psych:  Good affect, responds appropriately   LABS: Basic Metabolic Panel:  Recent Labs  16/10/96 0410 12/21/12 0344  NA 127* 130*  K 4.6 5.1  CL 97 97  CO2 21 28  GLUCOSE 382* 281*  BUN 35* 27*  CREATININE 0.72 0.86  CALCIUM 8.2* 8.4   Liver Function Tests: No results found for this basename: AST, ALT, ALKPHOS, BILITOT, PROT, ALBUMIN,  in the last 72 hours No results found for this basename: LIPASE, AMYLASE,  in the last 72 hours CBC:  Recent Labs  12/20/12 0410 12/21/12 0344  WBC 16.4* 13.0*  HGB 12.0 11.4*  HCT 34.4* 33.3*  MCV 90.8 90.5  PLT 103* 102*   Hemoglobin A1C:  Recent Labs  12/19/12 0228  HGBA1C 9.6*   Fasting Lipid Panel: No results found for this basename: CHOL, HDL, LDLCALC, TRIG, CHOLHDL, LDLDIRECT,  in the last 72 hours Thyroid Function Tests:  Recent Labs  12/18/12 2230  TSH 0.158*   Anemia Panel: No results found for this basename: VITAMINB12, FOLATE, FERRITIN,  TIBC, IRON, RETICCTPCT,  in the last 72 hours Coag Panel:   Lab Results  Component Value Date   INR 3.45* 12/21/2012   INR 4.06* 12/20/2012   INR 2.71* 12/19/2012    RADIOLOGY: Dg Chest 2 View  12/18/2012  *RADIOLOGY REPORT*  Clinical Data: Cough, weakness, shortness of breath  CHEST - 2 VIEW  Comparison: 04/29/2012  Findings: Left subclavian sequential transvenous pacemaker leads project within the right atrium and right ventricle. Enlargement of cardiac silhouette. Mediastinal contours and pulmonary vascularity normal. Interval decrease in size of nodular density at lower right chest, now 15 x 11 mm, previously 37 x 36 mm. Minimal right basilar atelectasis. Remaining lungs clear. No pleural effusion or pneumothorax. No acute osseous findings.  IMPRESSION: Interval decrease in size of nodular density lower right chest. Enlargement of cardiac silhouette. Minimal right basilar atelectasis.   Original Report Authenticated By: Ulyses Southward, M.D.       ASSESSMENT:  1.  Atrial fibrillation with RVR - now in atrial paced rhythm and off Cardizem gtt 2.  DKA secondary to steroid induced hyperglycemia - resolved 3.  Suppressed TSH ? Hyperthyroidism vs. Euthyroid sick syndrome - w/u per primary MD but needs to be treated if hyperthyroid to avoid  recurrent PAF   PLAN:   1.  Continue bystolic/flecainide - would not add Cardizem at this time since PAF secondary to acute metabolic disarray and has resolved.     Quintella Reichert, MD  12/21/2012  1:21 PM

## 2012-12-21 NOTE — Progress Notes (Signed)
Report called to Tess RN

## 2012-12-21 NOTE — Progress Notes (Signed)
Pt alert x4 , v/s 110/52,98.3,66,14.  Rounding Physician made aware of Cardizem drip being d/c, Md will consult with Cardiology  on weather  to start  Cardizem po will continue to monitor.

## 2012-12-21 NOTE — Progress Notes (Signed)
Pt alert x4 v/s 103/49,14,94% 2l [place] . Cardizem drip d/c order parameters met will monitor.

## 2012-12-22 ENCOUNTER — Telehealth (HOSPITAL_COMMUNITY): Payer: Self-pay | Admitting: Dietician

## 2012-12-22 DIAGNOSIS — J441 Chronic obstructive pulmonary disease with (acute) exacerbation: Secondary | ICD-10-CM

## 2012-12-22 DIAGNOSIS — I495 Sick sinus syndrome: Secondary | ICD-10-CM

## 2012-12-22 LAB — BASIC METABOLIC PANEL
BUN: 26 mg/dL — ABNORMAL HIGH (ref 6–23)
Chloride: 98 mEq/L (ref 96–112)
Glucose, Bld: 209 mg/dL — ABNORMAL HIGH (ref 70–99)
Potassium: 4.2 mEq/L (ref 3.5–5.1)

## 2012-12-22 LAB — CBC
HCT: 35 % — ABNORMAL LOW (ref 36.0–46.0)
Hemoglobin: 12.3 g/dL (ref 12.0–15.0)
MCH: 31.9 pg (ref 26.0–34.0)
MCHC: 35.1 g/dL (ref 30.0–36.0)
MCV: 90.7 fL (ref 78.0–100.0)

## 2012-12-22 LAB — GLUCOSE, CAPILLARY
Glucose-Capillary: 203 mg/dL — ABNORMAL HIGH (ref 70–99)
Glucose-Capillary: 183 mg/dL — ABNORMAL HIGH (ref 70–99)

## 2012-12-22 MED ORDER — INSULIN GLARGINE 100 UNIT/ML ~~LOC~~ SOLN
40.0000 [IU] | Freq: Every day | SUBCUTANEOUS | Status: AC
  Administered 2012-12-22: 40 [IU] via SUBCUTANEOUS

## 2012-12-22 MED ORDER — INSULIN ASPART PROT & ASPART (70-30 MIX) 100 UNIT/ML ~~LOC~~ SUSP
28.0000 [IU] | Freq: Two times a day (BID) | SUBCUTANEOUS | Status: DC
  Administered 2012-12-23: 28 [IU] via SUBCUTANEOUS
  Filled 2012-12-22: qty 10

## 2012-12-22 MED ORDER — INSULIN ASPART PROT & ASPART (70-30 MIX) 100 UNIT/ML ~~LOC~~ SUSP: 28.0000 [IU] | Freq: Two times a day (BID) | SUBCUTANEOUS | Status: DC

## 2012-12-22 MED ORDER — INSULIN ASPART 100 UNIT/ML ~~LOC~~ SOLN
6.0000 [IU] | Freq: Three times a day (TID) | SUBCUTANEOUS | Status: AC
  Administered 2012-12-22 (×3): 6 [IU] via SUBCUTANEOUS

## 2012-12-22 MED ORDER — WARFARIN SODIUM 2 MG PO TABS
2.0000 mg | ORAL_TABLET | Freq: Once | ORAL | Status: AC
  Administered 2012-12-22: 2 mg via ORAL
  Filled 2012-12-22: qty 1

## 2012-12-22 MED ORDER — LANCET DEVICE MISC: 1.0000 | Freq: Three times a day (TID) | Status: DC

## 2012-12-22 MED ORDER — BLOOD GLUCOSE METER KIT: PACK | Status: DC

## 2012-12-22 MED ORDER — DM-GUAIFENESIN ER 30-600 MG PO TB12: 1.0000 | ORAL_TABLET | Freq: Two times a day (BID) | ORAL | Status: DC

## 2012-12-22 MED ORDER — BENZONATATE 100 MG PO CAPS: 100.0000 mg | ORAL_CAPSULE | Freq: Three times a day (TID) | ORAL | Status: DC | PRN

## 2012-12-22 MED ORDER — WARFARIN SODIUM 5 MG PO TABS: 2.5000 mg | ORAL_TABLET | Freq: Every day | ORAL | Status: DC

## 2012-12-22 MED ORDER — OXYCODONE-ACETAMINOPHEN 10-325 MG PO TABS: 1.0000 | ORAL_TABLET | Freq: Three times a day (TID) | ORAL | Status: DC | PRN

## 2012-12-22 MED ORDER — DEXAMETHASONE 2 MG PO TABS
2.0000 mg | ORAL_TABLET | Freq: Two times a day (BID) | ORAL | Status: DC
  Administered 2012-12-22 – 2012-12-23 (×2): 2 mg via ORAL
  Filled 2012-12-22 (×4): qty 1

## 2012-12-22 NOTE — Progress Notes (Signed)
SUBJECTIVE:  Denies CP or dyspnea; no palpitations  OBJECTIVE:   Vitals:   Filed Vitals:   12/21/12 1940 12/21/12 2018 12/22/12 0017 12/22/12 0423  BP:  120/50 108/45 129/55  Pulse:  72 76 64  Temp:  98 F (36.7 C) 98.2 F (36.8 C) 97.8 F (36.6 C)  TempSrc:  Oral Oral Oral  Resp:  16  18  Height:      Weight:      SpO2: 94% 95% 94% 95%   I&O's:    Intake/Output Summary (Last 24 hours) at 12/22/12 0657 Last data filed at 12/22/12 0606  Gross per 24 hour  Intake   1095 ml  Output    350 ml  Net    745 ml   TELEMETRY: Reviewed telemetry pt in atrial paced rhythm with PVCs     PHYSICAL EXAM General: Well developed, well nourished, in no acute distress Head: Alopecia Neck: supple Lungs:   Clear bilaterally to auscultation and percussion. Heart:   HRRR  Abdomen: Abdomen soft and non-tender Extremities:   No edema.   Neuro: Alert and oriented X 3.    LABS: Basic Metabolic Panel:  Recent Labs  16/10/96 0344 12/22/12 0419  NA 130* 131*  K 5.1 4.2  CL 97 98  CO2 28 26  GLUCOSE 281* 209*  BUN 27* 26*  CREATININE 0.86 0.71  CALCIUM 8.4 8.3*   CBC:  Recent Labs  12/21/12 0344 12/22/12 0419  WBC 13.0* 12.6*  HGB 11.4* 12.3  HCT 33.3* 35.0*  MCV 90.5 90.7  PLT 102* 107*   Thyroid Function Tests:  Recent Labs  12/19/12 1200  T3FREE 1.4*   Coag Panel:   Lab Results  Component Value Date   INR 1.99* 12/22/2012   INR 3.45* 12/21/2012   INR 4.06* 12/20/2012    RADIOLOGY: Dg Chest 2 View  12/18/2012  *RADIOLOGY REPORT*  Clinical Data: Cough, weakness, shortness of breath  CHEST - 2 VIEW  Comparison: 04/29/2012  Findings: Left subclavian sequential transvenous pacemaker leads project within the right atrium and right ventricle. Enlargement of cardiac silhouette. Mediastinal contours and pulmonary vascularity normal. Interval decrease in size of nodular density at lower right chest, now 15 x 11 mm, previously 37 x 36 mm. Minimal right basilar  atelectasis. Remaining lungs clear. No pleural effusion or pneumothorax. No acute osseous findings.  IMPRESSION: Interval decrease in size of nodular density lower right chest. Enlargement of cardiac silhouette. Minimal right basilar atelectasis.   Original Report Authenticated By: Ulyses Southward, M.D.       ASSESSMENT:  1.  Atrial fibrillation with RVR - now in atrial paced rhythm 2.  DKA secondary to steroid induced hyperglycemia 3.  Suppressed TSH - Probable euthyroid sick syndrome  4.  Lung Ca   PLAN:   1.  Continue bystolic/flecainide/coumadin; FU Dr [name] for PAF. Please call with questions.    Olga Millers, MD  12/22/2012  6:57 AM

## 2012-12-22 NOTE — Progress Notes (Signed)
ANTICOAGULATION CONSULT NOTE - Follow Up Consult  Pharmacy Consult for Warfarin Indication: Afib, Hx PE  Allergies  Allergen Reactions  . Latex     "pulls skin off"    Patient Measurements: Height: 5\' 2"  (157.5 cm) Weight: 170 lb (77.111 kg) IBW/kg (Calculated) : 50.1 ~79kg  Vital Signs: Temp: 97.8 F (36.6 C) (03/17 0423) Temp src: Oral (03/17 0423) BP: 129/55 mmHg (03/17 0423) Pulse Rate: 64 (03/17 0423)  Labs:  Recent Labs  12/20/12 0410 12/21/12 0344 12/22/12 0419  HGB 12.0 11.4* 12.3  HCT 34.4* 33.3* 35.0*  PLT 103* 102* 107*  LABPROT 37.0* 32.8* 21.8*  INR 4.06* 3.45* 1.99*  CREATININE 0.72 0.86 0.71    Estimated Creatinine Clearance: 70.1 ml/min (by C-G formula based on Cr of 0.71).   Medications:  Scheduled:  . albuterol  2.5 mg Nebulization TID  . aspirin EC  81 mg Oral Daily  . atorvastatin  80 mg Oral q1800  . dexamethasone  4 mg Oral BID WC  . dextromethorphan-guaiFENesin  1 tablet Oral BID  . feeding supplement  237 mL Oral BID BM  . flecainide  100 mg Oral BID  . folic acid  1 mg Oral Daily  . insulin aspart  0-15 Units Subcutaneous TID WC  . insulin aspart  0-5 Units Subcutaneous QHS  . insulin aspart  6 Units Subcutaneous TID WC  . insulin glargine  40 Units Subcutaneous Daily  . ipratropium  500 mcg Nebulization TID  . irbesartan  300 mg Oral Daily  . multivitamin with minerals  1 tablet Oral Daily  . nebivolol  10 mg Oral Daily  . pantoprazole  40 mg Oral Daily  . Warfarin - Pharmacist Dosing Inpatient   Does not apply q1800  . [DISCONTINUED] insulin aspart  5 Units Subcutaneous TID WC  . [DISCONTINUED] insulin glargine  35 Units Subcutaneous Daily   Infusions:  . sodium chloride 50 mL/hr at 12/22/12 0606    Assessment: 82 yoF with NSC Lung Ca received chemo 3/13 at [facility] (Pemetrexed/Carbo). Found to be in DKA  Chronic warfarin therapy for Afib and Hx of PE  [facility] note re Warfarin states dose adjusted to 2.5mg  daily except 5mg   on Tues which is different from patient stated dosing of 2.5mg  Tu,Th,Sa & 5 mg other days.   INR = 1.99, no warfarin doses given since 3/14 for supratherapeutic INR  H/H essentially stable, Pltc =107 as expected s/p chemo, no bleeding/complications reported.  Goal of Therapy:  INR 2-3   Plan:   Reduce Warfarin dose to 2mg  tonight based on INR rise with 2.5mg  x 2 doses as inpatient.   Daily PT/INR  Juliette Alcide, PharmD, BCPS.   Pager: [phone] 12/22/2012,11:40 AM
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
## 2012-12-22 NOTE — Progress Notes (Signed)
Inpatient Diabetes Program Recommendations  AACE/ADA: New Consensus Statement on Inpatient Glycemic Control (2013)  Target Ranges:  Prepandial:   less than 140 mg/dL      Peak postprandial:   less than 180 mg/dL (1-2 hours)      Critically ill patients:  140 - 180 mg/dL   Reason for Visit: Consult for Newly-diagnosed DM  DKA due to steroid induce hyperglycemia resolved, now with hyperglycemia, newly-dignosed DM  Hemoglobin A1c 9.6%  To f/u with Dr. Cyndee Brightly (PCP) for diabetes management in [place] - to schedule appt within 10 days of discharge. To attend diabetes class at [facility]  Information given to pt. These are free of charge each month for [facility] residents. To be discharged on Novolog 70/30 Flexpen 28 units bid. Instructed on hypoglycemia s/s and treatment. Pt to obtain glucose meter of choice and check blood sugars 2 - 3 times/day.  Take logbook to doctor appt for needed adjustments to insulin regimen.  If steroid dose is decreased, Dr Shirline Frees to adjust insulin dose as needed.  Education materials and phone numbers given. Pt verbalizes understanding and states she is very motivated to control blood sugars at home.  Discussed with MD, RN and case manager.  Results for OLIVER, NEUWIRTH (MRN [id number]) as of 12/22/2012 10:36  Ref. Range 12/21/2012 08:20 12/21/2012 12:26 12/21/2012 16:51 12/21/2012 21:30 12/22/2012 04:19 12/22/2012 07:42  Glucose-Capillary Latest Range: 70-99 mg/dL 621 (H) 308 (H) 657 (H) 165 (H)  203 (H)   Will need prescription for insulin pen (70/30 Novolog Flexpen), pen needles, glucose meter and supplies.  Thank you. Ailene Ards, RD, LDN, CDE Inpatient Diabetes Coordinator [phone]

## 2012-12-22 NOTE — Telephone Encounter (Signed)
Received call from Pamala Hurry, diabetes coordinator at [facility]. Pt is currently admitted at [facility] at DKA. Also undergoing CA tc currently. Per Bjorn Loser, pt is newly diagnosed with diabetes and will likely be discharged on Lantus 70/30 28 units BID. Hgb A1c was in the 9's. Pt referred to [facility] as pt resides in [facility]. Will f/u with pt after discharge to schedule appointment.

## 2012-12-22 NOTE — Progress Notes (Signed)
[facility]  Patient Status: Active (receiving services up to time of hospitalization)  [facility] is providing the following services: RN and ST  If patient discharges after hours, please call [phone].  Please resume HHRN and HHST.   Thank you  Norberta Keens, RN, BSN [phone] 12/22/2012, 11:58 AM

## 2012-12-23 ENCOUNTER — Encounter (HOSPITAL_COMMUNITY): Payer: Self-pay | Admitting: Internal Medicine

## 2012-12-23 LAB — BASIC METABOLIC PANEL
BUN: 27 mg/dL — ABNORMAL HIGH (ref 6–23)
CO2: 27 mEq/L (ref 19–32)
Calcium: 8.4 mg/dL (ref 8.4–10.5)
Creatinine, Ser: 0.68 mg/dL (ref 0.50–1.10)

## 2012-12-23 LAB — CBC
HCT: 35.8 % — ABNORMAL LOW (ref 36.0–46.0)
MCHC: 34.9 g/dL (ref 30.0–36.0)
MCV: 90.9 fL (ref 78.0–100.0)
Platelets: 124 10*3/uL — ABNORMAL LOW (ref 150–400)
RDW: 13.9 % (ref 11.5–15.5)
WBC: 13.4 10*3/uL — ABNORMAL HIGH (ref 4.0–10.5)

## 2012-12-23 LAB — PROTIME-INR
INR: 1.37 (ref 0.00–1.49)
Prothrombin Time: 16.5 seconds — ABNORMAL HIGH (ref 11.6–15.2)

## 2012-12-23 LAB — GLUCOSE, CAPILLARY

## 2012-12-23 MED ORDER — WARFARIN SODIUM 2.5 MG PO TABS
2.5000 mg | ORAL_TABLET | Freq: Once | ORAL | Status: DC
  Filled 2012-12-23: qty 1

## 2012-12-23 MED ORDER — GLUCOSE 4 G PO CHEW: 16.0000 g | CHEWABLE_TABLET | ORAL | Status: DC | PRN

## 2012-12-23 MED ORDER — DEXAMETHASONE 4 MG PO TABS: 2.0000 mg | ORAL_TABLET | Freq: Two times a day (BID) | ORAL | Status: DC

## 2012-12-23 NOTE — Progress Notes (Signed)
ANTICOAGULATION CONSULT NOTE - Follow Up Consult  Pharmacy Consult for Warfarin Indication: Afib, Hx PE  Allergies  Allergen Reactions  . Latex     "pulls skin off"    Patient Measurements: Height: 5\' 2"  (157.5 cm) Weight: 170 lb (77.111 kg) IBW/kg (Calculated) : 50.1 ~79kg  Vital Signs: Temp: 98.2 F (36.8 C) (03/18 0538) Temp src: Oral (03/18 0538) BP: 118/28 mmHg (03/18 0538) Pulse Rate: 65 (03/18 0538)  Labs:  Recent Labs  12/21/12 0344 12/22/12 0419 12/23/12 0454  HGB 11.4* 12.3 12.5  HCT 33.3* 35.0* 35.8*  PLT 102* 107* 124*  LABPROT 32.8* 21.8* 16.5*  INR 3.45* 1.99* 1.37  CREATININE 0.86 0.71 0.68    Estimated Creatinine Clearance: 70.1 ml/min (by C-G formula based on Cr of 0.68).   Medications:  Scheduled:  . albuterol  2.5 mg Nebulization TID  . aspirin EC  81 mg Oral Daily  . atorvastatin  80 mg Oral q1800  . dexamethasone  2 mg Oral BID WC  . dextromethorphan-guaiFENesin  1 tablet Oral BID  . feeding supplement  237 mL Oral BID BM  . flecainide  100 mg Oral BID  . folic acid  1 mg Oral Daily  . [COMPLETED] insulin aspart  0-15 Units Subcutaneous TID WC  . [EXPIRED] insulin aspart  0-5 Units Subcutaneous QHS  . [COMPLETED] insulin aspart  6 Units Subcutaneous TID WC  . insulin aspart protamine-insulin aspart  28 Units Subcutaneous BID WC  . ipratropium  500 mcg Nebulization TID  . irbesartan  300 mg Oral Daily  . multivitamin with minerals  1 tablet Oral Daily  . nebivolol  10 mg Oral Daily  . pantoprazole  40 mg Oral Daily  . [COMPLETED] warfarin  2 mg Oral ONCE-1800  . Warfarin - Pharmacist Dosing Inpatient   Does not apply q1800  . [DISCONTINUED] dexamethasone  4 mg Oral BID WC   Infusions:  . [DISCONTINUED] sodium chloride 50 mL/hr at 12/22/12 0606    Assessment: 34 yoF with NSC Lung Ca received chemo 3/13 at [facility] (Pemetrexed/Carbo). Found to be in DKA  Chronic warfarin therapy for Afib and Hx of PE  [facility] note re Warfarin states  dose adjusted to 2.5mg  daily except 5mg  on Tues which is different from patient stated dosing of 2.5mg  Tu,Th,Sa & 5 mg other days.   INR = 1.37, falling after warfarin held for supratherapeutic INR (no warfarin doses given since 3/14-3/16) H/H and plts improving s/p chemo, no bleeding/complications reported.  Goal of Therapy:  INR 2-3   Plan:   Reduce Warfarin dose to 2.5mg  tonight based on previous INR response  To go home today, suggest 2.5mg  daily with INR f/u later this week  Daily PT/INR  Juliette Alcide, PharmD, BCPS.   Pager: [phone] 12/23/2012,12:27 PM

## 2012-12-24 ENCOUNTER — Telehealth: Payer: Self-pay | Admitting: *Deleted

## 2012-12-24 ENCOUNTER — Ambulatory Visit (INDEPENDENT_AMBULATORY_CARE_PROVIDER_SITE_OTHER): Payer: Medicare Other | Admitting: *Deleted

## 2012-12-24 DIAGNOSIS — I4891 Unspecified atrial fibrillation: Secondary | ICD-10-CM

## 2012-12-24 DIAGNOSIS — Z7901 Long term (current) use of anticoagulants: Secondary | ICD-10-CM

## 2012-12-24 LAB — POCT INR: INR: 1.2

## 2012-12-24 NOTE — Telephone Encounter (Signed)
See coumadin note. 

## 2012-12-24 NOTE — Telephone Encounter (Signed)
INR - 1.2 / tgs

## 2012-12-25 ENCOUNTER — Other Ambulatory Visit: Payer: Self-pay | Admitting: *Deleted

## 2012-12-25 ENCOUNTER — Ambulatory Visit: Payer: Medicare Other | Admitting: Physician Assistant

## 2012-12-25 ENCOUNTER — Other Ambulatory Visit: Payer: Medicare Other | Admitting: Lab

## 2012-12-25 NOTE — Telephone Encounter (Signed)
Called pt at [phone]. Appointment scheduled for 01/06/13 at 1000 ([facility] group class).

## 2012-12-26 ENCOUNTER — Telehealth: Payer: Self-pay | Admitting: Internal Medicine

## 2012-12-29 ENCOUNTER — Ambulatory Visit (INDEPENDENT_AMBULATORY_CARE_PROVIDER_SITE_OTHER): Payer: Medicare Other | Admitting: *Deleted

## 2012-12-29 DIAGNOSIS — Z7901 Long term (current) use of anticoagulants: Secondary | ICD-10-CM

## 2012-12-29 DIAGNOSIS — I4891 Unspecified atrial fibrillation: Secondary | ICD-10-CM

## 2012-12-29 LAB — POCT INR: INR: 1.5

## 2012-12-31 ENCOUNTER — Telehealth: Payer: Self-pay | Admitting: *Deleted

## 2012-12-31 ENCOUNTER — Ambulatory Visit: Payer: Medicare Other | Admitting: Physician Assistant

## 2012-12-31 ENCOUNTER — Other Ambulatory Visit: Payer: Medicare Other | Admitting: Lab

## 2012-12-31 NOTE — Telephone Encounter (Signed)
Pt's sister [name] called stating she wants to discuss how pt has missed her appts.  Informed her there have been 2 appts she has missed and [name] is not sure why she has missed those appts.  Spoke with [name], she said it was okay to r/s lab, AJ, chemo on 4/3.  [name] said she needs to talk with her other sister Aggie Cosier because she needs to get more information.  She will call us back tomorrow.  SLJ

## 2013-01-01 ENCOUNTER — Other Ambulatory Visit: Payer: Medicare Other

## 2013-01-01 ENCOUNTER — Ambulatory Visit (INDEPENDENT_AMBULATORY_CARE_PROVIDER_SITE_OTHER): Payer: Medicare Other | Admitting: *Deleted

## 2013-01-01 DIAGNOSIS — I4891 Unspecified atrial fibrillation: Secondary | ICD-10-CM

## 2013-01-01 DIAGNOSIS — Z7901 Long term (current) use of anticoagulants: Secondary | ICD-10-CM

## 2013-01-01 LAB — POCT INR: INR: 1.5

## 2013-01-05 ENCOUNTER — Ambulatory Visit (INDEPENDENT_AMBULATORY_CARE_PROVIDER_SITE_OTHER): Payer: Medicare Other | Admitting: Cardiology

## 2013-01-05 ENCOUNTER — Encounter (INDEPENDENT_AMBULATORY_CARE_PROVIDER_SITE_OTHER): Payer: Self-pay

## 2013-01-05 DIAGNOSIS — Z7901 Long term (current) use of anticoagulants: Secondary | ICD-10-CM

## 2013-01-05 DIAGNOSIS — I4891 Unspecified atrial fibrillation: Secondary | ICD-10-CM

## 2013-01-05 LAB — POCT INR: INR: 2.2

## 2013-01-06 ENCOUNTER — Telehealth (HOSPITAL_COMMUNITY): Payer: Self-pay | Admitting: Dietician

## 2013-01-06 NOTE — Telephone Encounter (Signed)
Pt was a no-show for diabetes class scheduled for 01/06/2013 at 1000.

## 2013-01-07 ENCOUNTER — Telehealth: Payer: Self-pay | Admitting: Internal Medicine

## 2013-01-07 NOTE — Telephone Encounter (Signed)
, °

## 2013-01-08 ENCOUNTER — Telehealth: Payer: Self-pay | Admitting: Internal Medicine

## 2013-01-08 ENCOUNTER — Ambulatory Visit (HOSPITAL_BASED_OUTPATIENT_CLINIC_OR_DEPARTMENT_OTHER): Payer: Medicare Other

## 2013-01-08 ENCOUNTER — Other Ambulatory Visit: Payer: Self-pay | Admitting: Oncology

## 2013-01-08 ENCOUNTER — Other Ambulatory Visit (HOSPITAL_BASED_OUTPATIENT_CLINIC_OR_DEPARTMENT_OTHER): Payer: Medicare Other | Admitting: Lab

## 2013-01-08 ENCOUNTER — Telehealth: Payer: Self-pay | Admitting: *Deleted

## 2013-01-08 ENCOUNTER — Encounter: Payer: Self-pay | Admitting: Oncology

## 2013-01-08 ENCOUNTER — Other Ambulatory Visit: Payer: Medicare Other | Admitting: Lab

## 2013-01-08 ENCOUNTER — Ambulatory Visit (HOSPITAL_BASED_OUTPATIENT_CLINIC_OR_DEPARTMENT_OTHER): Payer: Medicare Other | Admitting: Physician Assistant

## 2013-01-08 VITALS — BP 95/51 | HR 72 | Temp 97.5°F | Resp 20 | Ht 62.0 in | Wt 158.5 lb

## 2013-01-08 DIAGNOSIS — C342 Malignant neoplasm of middle lobe, bronchus or lung: Secondary | ICD-10-CM

## 2013-01-08 DIAGNOSIS — C3430 Malignant neoplasm of lower lobe, unspecified bronchus or lung: Secondary | ICD-10-CM

## 2013-01-08 DIAGNOSIS — C3490 Malignant neoplasm of unspecified part of unspecified bronchus or lung: Secondary | ICD-10-CM

## 2013-01-08 DIAGNOSIS — Z5111 Encounter for antineoplastic chemotherapy: Secondary | ICD-10-CM

## 2013-01-08 DIAGNOSIS — C7931 Secondary malignant neoplasm of brain: Secondary | ICD-10-CM

## 2013-01-08 LAB — CBC WITH DIFFERENTIAL/PLATELET
BASO%: 0.1 % (ref 0.0–2.0)
Eosinophils Absolute: 0 10*3/uL (ref 0.0–0.5)
LYMPH%: 5.6 % — ABNORMAL LOW (ref 14.0–49.7)
MCHC: 33.9 g/dL (ref 31.5–36.0)
MONO#: 1.7 10*3/uL — ABNORMAL HIGH (ref 0.1–0.9)
NEUT#: 26.9 10*3/uL — ABNORMAL HIGH (ref 1.5–6.5)
Platelets: 247 10*3/uL (ref 145–400)
RBC: 3.34 10*6/uL — ABNORMAL LOW (ref 3.70–5.45)
RDW: 15.2 % — ABNORMAL HIGH (ref 11.2–14.5)
WBC: 30.3 10*3/uL — ABNORMAL HIGH (ref 3.9–10.3)
lymph#: 1.7 10*3/uL (ref 0.9–3.3)

## 2013-01-08 LAB — COMPREHENSIVE METABOLIC PANEL (CC13)
ALT: 69 U/L — ABNORMAL HIGH (ref 0–55)
Albumin: 2.7 g/dL — ABNORMAL LOW (ref 3.5–5.0)
CO2: 23 mEq/L (ref 22–29)
Chloride: 100 mEq/L (ref 98–107)
Glucose: 144 mg/dl — ABNORMAL HIGH (ref 70–99)
Potassium: 4.4 mEq/L (ref 3.5–5.1)
Sodium: 133 mEq/L — ABNORMAL LOW (ref 136–145)
Total Bilirubin: 0.42 mg/dL (ref 0.20–1.20)
Total Protein: 5.6 g/dL — ABNORMAL LOW (ref 6.4–8.3)

## 2013-01-08 LAB — TECHNOLOGIST REVIEW

## 2013-01-08 MED ORDER — SODIUM CHLORIDE 0.9 % IV SOLN
500.0000 mg/m2 | Freq: Once | INTRAVENOUS | Status: AC
  Administered 2013-01-08: 925 mg via INTRAVENOUS
  Filled 2013-01-08: qty 37

## 2013-01-08 MED ORDER — SODIUM CHLORIDE 0.9 % IV SOLN
490.0000 mg | Freq: Once | INTRAVENOUS | Status: AC
  Administered 2013-01-08: 490 mg via INTRAVENOUS
  Filled 2013-01-08: qty 49

## 2013-01-08 MED ORDER — ONDANSETRON 16 MG/50ML IVPB (CHCC)
16.0000 mg | Freq: Once | INTRAVENOUS | Status: AC
  Administered 2013-01-08: 16 mg via INTRAVENOUS

## 2013-01-08 MED ORDER — SODIUM CHLORIDE 0.9 % IV SOLN
Freq: Once | INTRAVENOUS | Status: AC
  Administered 2013-01-08: 15:00:00 via INTRAVENOUS

## 2013-01-08 MED ORDER — POTASSIUM CHLORIDE CRYS ER 20 MEQ PO TBCR: 20.0000 meq | EXTENDED_RELEASE_TABLET | Freq: Every day | ORAL | Status: DC

## 2013-01-08 NOTE — Telephone Encounter (Signed)
gv and printed appt schedule for pt for April °

## 2013-01-08 NOTE — Patient Instructions (Addendum)
Continue weekly labs as scheduled Bring all your medications so they can be reviewed when you come for your weekly labs next week Followup in 3 weeks prior to your next scheduled cycle of chemotherapy

## 2013-01-08 NOTE — Patient Instructions (Addendum)
[facility] Discharge Instructions for Patients Receiving Chemotherapy  Today you received the following chemotherapy agents carboplatin, alimta  To help prevent nausea and vomiting after your treatment, we encourage you to take your nausea medication as directed by MD. Begin taking it at 9 pm if needed   If you develop nausea and vomiting that is not controlled by your nausea medication, call the clinic. If it is after clinic hours your family physician or the after hours number for the clinic or go to the Emergency Department.   BELOW ARE SYMPTOMS THAT SHOULD BE REPORTED IMMEDIATELY:  *FEVER GREATER THAN 100.5 F  *CHILLS WITH OR WITHOUT FEVER  NAUSEA AND VOMITING THAT IS NOT CONTROLLED WITH YOUR NAUSEA MEDICATION  *UNUSUAL SHORTNESS OF BREATH  *UNUSUAL BRUISING OR BLEEDING  TENDERNESS IN MOUTH AND THROAT WITH OR WITHOUT PRESENCE OF ULCERS  *URINARY PROBLEMS  *BOWEL PROBLEMS  UNUSUAL RASH Items with * indicate a potential emergency and should be followed up as soon as possible.   Feel free to call the clinic you have any questions or concerns. The clinic phone number is [phone].   I have been informed and understand all the instructions given to me. I know to contact the clinic, my physician, or go to the Emergency Department if any problems should occur. I do not have any questions at this time, but understand that I may call the clinic during office hours   should I have any questions or need assistance in obtaining follow up care.    __________________________________________  _____________  __________ Signature of Patient or Authorized Representative            Date                   Time    __________________________________________ Nurse's Signature

## 2013-01-08 NOTE — Telephone Encounter (Signed)
Noted incoming refill request for pt klor con qd, noted pt up to date on OV, sent via escribe

## 2013-01-09 ENCOUNTER — Other Ambulatory Visit: Payer: Self-pay | Admitting: Internal Medicine

## 2013-01-09 ENCOUNTER — Other Ambulatory Visit: Payer: Self-pay | Admitting: *Deleted

## 2013-01-09 MED ORDER — FOLIC ACID 1 MG PO TABS: 1.0000 mg | ORAL_TABLET | Freq: Every day | ORAL | Status: DC

## 2013-01-12 ENCOUNTER — Ambulatory Visit (INDEPENDENT_AMBULATORY_CARE_PROVIDER_SITE_OTHER): Payer: Medicare Other | Admitting: *Deleted

## 2013-01-12 DIAGNOSIS — Z7901 Long term (current) use of anticoagulants: Secondary | ICD-10-CM

## 2013-01-12 DIAGNOSIS — I4891 Unspecified atrial fibrillation: Secondary | ICD-10-CM

## 2013-01-14 ENCOUNTER — Emergency Department (HOSPITAL_COMMUNITY): Payer: Medicare Other

## 2013-01-14 ENCOUNTER — Encounter (HOSPITAL_COMMUNITY): Payer: Self-pay | Admitting: Emergency Medicine

## 2013-01-14 ENCOUNTER — Inpatient Hospital Stay (HOSPITAL_COMMUNITY)
Admission: EM | Admit: 2013-01-14 | Discharge: 2013-01-20 | DRG: 811 | Disposition: A | Discharge status: Nursing Home (Includes ICF) | Payer: Medicare Other | Attending: Internal Medicine | Admitting: Internal Medicine

## 2013-01-14 DIAGNOSIS — I2510 Atherosclerotic heart disease of native coronary artery without angina pectoris: Secondary | ICD-10-CM | POA: Diagnosis present

## 2013-01-14 DIAGNOSIS — Z7901 Long term (current) use of anticoagulants: Secondary | ICD-10-CM

## 2013-01-14 DIAGNOSIS — E669 Obesity, unspecified: Secondary | ICD-10-CM | POA: Diagnosis present

## 2013-01-14 DIAGNOSIS — J4489 Other specified chronic obstructive pulmonary disease: Secondary | ICD-10-CM | POA: Diagnosis present

## 2013-01-14 DIAGNOSIS — K259 Gastric ulcer, unspecified as acute or chronic, without hemorrhage or perforation: Secondary | ICD-10-CM | POA: Diagnosis present

## 2013-01-14 DIAGNOSIS — C7949 Secondary malignant neoplasm of other parts of nervous system: Secondary | ICD-10-CM | POA: Diagnosis present

## 2013-01-14 DIAGNOSIS — D638 Anemia in other chronic diseases classified elsewhere: Secondary | ICD-10-CM | POA: Diagnosis present

## 2013-01-14 DIAGNOSIS — K254 Chronic or unspecified gastric ulcer with hemorrhage: Secondary | ICD-10-CM | POA: Diagnosis present

## 2013-01-14 DIAGNOSIS — D696 Thrombocytopenia, unspecified: Secondary | ICD-10-CM | POA: Diagnosis present

## 2013-01-14 DIAGNOSIS — E663 Overweight: Secondary | ICD-10-CM | POA: Diagnosis present

## 2013-01-14 DIAGNOSIS — E162 Hypoglycemia, unspecified: Secondary | ICD-10-CM | POA: Diagnosis present

## 2013-01-14 DIAGNOSIS — R634 Abnormal weight loss: Secondary | ICD-10-CM | POA: Diagnosis present

## 2013-01-14 DIAGNOSIS — R571 Hypovolemic shock: Secondary | ICD-10-CM | POA: Diagnosis present

## 2013-01-14 DIAGNOSIS — F411 Generalized anxiety disorder: Secondary | ICD-10-CM | POA: Diagnosis present

## 2013-01-14 DIAGNOSIS — R410 Disorientation, unspecified: Secondary | ICD-10-CM

## 2013-01-14 DIAGNOSIS — I10 Essential (primary) hypertension: Secondary | ICD-10-CM | POA: Diagnosis present

## 2013-01-14 DIAGNOSIS — C7931 Secondary malignant neoplasm of brain: Secondary | ICD-10-CM | POA: Diagnosis present

## 2013-01-14 DIAGNOSIS — I959 Hypotension, unspecified: Secondary | ICD-10-CM

## 2013-01-14 DIAGNOSIS — Z825 Family history of asthma and other chronic lower respiratory diseases: Secondary | ICD-10-CM

## 2013-01-14 DIAGNOSIS — Z794 Long term (current) use of insulin: Secondary | ICD-10-CM

## 2013-01-14 DIAGNOSIS — Z86711 Personal history of pulmonary embolism: Secondary | ICD-10-CM

## 2013-01-14 DIAGNOSIS — R5381 Other malaise: Secondary | ICD-10-CM | POA: Diagnosis present

## 2013-01-14 DIAGNOSIS — E785 Hyperlipidemia, unspecified: Secondary | ICD-10-CM | POA: Diagnosis present

## 2013-01-14 DIAGNOSIS — D72829 Elevated white blood cell count, unspecified: Secondary | ICD-10-CM | POA: Diagnosis present

## 2013-01-14 DIAGNOSIS — Z950 Presence of cardiac pacemaker: Secondary | ICD-10-CM | POA: Diagnosis present

## 2013-01-14 DIAGNOSIS — C3490 Malignant neoplasm of unspecified part of unspecified bronchus or lung: Secondary | ICD-10-CM | POA: Diagnosis present

## 2013-01-14 DIAGNOSIS — D649 Anemia, unspecified: Principal | ICD-10-CM | POA: Diagnosis present

## 2013-01-14 DIAGNOSIS — E876 Hypokalemia: Secondary | ICD-10-CM | POA: Diagnosis present

## 2013-01-14 DIAGNOSIS — T383X5A Adverse effect of insulin and oral hypoglycemic [antidiabetic] drugs, initial encounter: Secondary | ICD-10-CM | POA: Diagnosis present

## 2013-01-14 DIAGNOSIS — I498 Other specified cardiac arrhythmias: Secondary | ICD-10-CM | POA: Diagnosis present

## 2013-01-14 DIAGNOSIS — E860 Dehydration: Secondary | ICD-10-CM

## 2013-01-14 DIAGNOSIS — T451X5A Adverse effect of antineoplastic and immunosuppressive drugs, initial encounter: Secondary | ICD-10-CM | POA: Diagnosis present

## 2013-01-14 DIAGNOSIS — I252 Old myocardial infarction: Secondary | ICD-10-CM

## 2013-01-14 DIAGNOSIS — T380X5A Adverse effect of glucocorticoids and synthetic analogues, initial encounter: Secondary | ICD-10-CM | POA: Diagnosis present

## 2013-01-14 DIAGNOSIS — E2749 Other adrenocortical insufficiency: Secondary | ICD-10-CM | POA: Diagnosis present

## 2013-01-14 DIAGNOSIS — R739 Hyperglycemia, unspecified: Secondary | ICD-10-CM

## 2013-01-14 DIAGNOSIS — I4891 Unspecified atrial fibrillation: Secondary | ICD-10-CM | POA: Diagnosis present

## 2013-01-14 DIAGNOSIS — I480 Paroxysmal atrial fibrillation: Secondary | ICD-10-CM | POA: Diagnosis present

## 2013-01-14 DIAGNOSIS — R578 Other shock: Secondary | ICD-10-CM | POA: Diagnosis present

## 2013-01-14 DIAGNOSIS — D6481 Anemia due to antineoplastic chemotherapy: Secondary | ICD-10-CM | POA: Diagnosis present

## 2013-01-14 DIAGNOSIS — E139 Other specified diabetes mellitus without complications: Secondary | ICD-10-CM | POA: Diagnosis present

## 2013-01-14 DIAGNOSIS — Y92009 Unspecified place in unspecified non-institutional (private) residence as the place of occurrence of the external cause: Secondary | ICD-10-CM

## 2013-01-14 DIAGNOSIS — Z87891 Personal history of nicotine dependence: Secondary | ICD-10-CM

## 2013-01-14 DIAGNOSIS — Z6830 Body mass index (BMI) 30.0-30.9, adult: Secondary | ICD-10-CM

## 2013-01-14 DIAGNOSIS — E782 Mixed hyperlipidemia: Secondary | ICD-10-CM | POA: Diagnosis present

## 2013-01-14 DIAGNOSIS — J449 Chronic obstructive pulmonary disease, unspecified: Secondary | ICD-10-CM | POA: Diagnosis present

## 2013-01-14 DIAGNOSIS — Z8249 Family history of ischemic heart disease and other diseases of the circulatory system: Secondary | ICD-10-CM

## 2013-01-14 DIAGNOSIS — F05 Delirium due to known physiological condition: Secondary | ICD-10-CM | POA: Diagnosis present

## 2013-01-14 DIAGNOSIS — Z801 Family history of malignant neoplasm of trachea, bronchus and lung: Secondary | ICD-10-CM

## 2013-01-14 LAB — CBC WITH DIFFERENTIAL/PLATELET
Basophils Absolute: 0 10*3/uL (ref 0.0–0.1)
Basophils Relative: 0 % (ref 0–1)
Lymphocytes Relative: 11 % — ABNORMAL LOW (ref 12–46)
MCHC: 35.3 g/dL (ref 30.0–36.0)
Neutro Abs: 5.4 10*3/uL (ref 1.7–7.7)
Neutrophils Relative %: 87 % — ABNORMAL HIGH (ref 43–77)
Platelets: 174 10*3/uL (ref 150–400)
RDW: 17 % — ABNORMAL HIGH (ref 11.5–15.5)
WBC: 6.2 10*3/uL (ref 4.0–10.5)

## 2013-01-14 LAB — GLUCOSE, CAPILLARY: Glucose-Capillary: 40 mg/dL — CL (ref 70–99)

## 2013-01-14 LAB — PROTIME-INR
INR: 1.64 — ABNORMAL HIGH (ref 0.00–1.49)
Prothrombin Time: 18.9 seconds — ABNORMAL HIGH (ref 11.6–15.2)

## 2013-01-14 MED ORDER — DEXTROSE 50 % IV SOLN
INTRAVENOUS | Status: AC
  Administered 2013-01-14: 50 mL
  Filled 2013-01-14: qty 50

## 2013-01-14 NOTE — ED Provider Notes (Signed)
History    This chart was scribed for Ward Givens, MD by Marlyne Beards, ED Scribe. The patient was seen in room APA02/APA02. Patient's care was started at 11:20 PM.    CSN: [id number]  Arrival date & time 01/14/13  2314   First MD Initiated Contact with Patient 01/14/13 2320      Chief Complaint  Patient presents with  . Hypoglycemia  . Hypotension    (Consider location/radiation/quality/duration/timing/severity/associated sxs/prior treatment) The history is provided by the patient. No language interpreter was used.   [name] is a 62 y.o. female with h/o atrial fibrilation, COPD, and coronary atherosclerosis brought in by EMS who presents to the Emergency Department complaining of hypotension and hypoglycemia onset pta. EMS states that family called them to come. Pt was in her bed when EMS got there. Family stated that she had not eaten all day and pt reports she has not felt well all day with associated nausea. When EMS got to the scene pt had a CBG of 50 and they gave her orange juice to drink. En route to the ED they checked her CBG again and it was 73. As of now her CBG 40 in the ED. Pt is alert and oriented at this time in the ED after being given IV dextrose.. Pt denies any trouble breathing/swallowing, fever, chills, cough, nausea, vomiting, diarrhea, SOB, weakness, and any other associated symptoms. Pt states she lives alone. Pt underwent radiation therapy to her right lung that was finished October 2014 with Dr. Gwenyth Bouillon in [place] for her metastatic lung cancer. Pt was started on chemotherapy on January 2014 and in February she had a CT scan finding a metastatic right cerebellar lesion and a possible second one on the right posterior parietal lobe of her brain and she is getting radiation therapy now, stating she has only 3 more treatments of 40. Pt was started on Decadron on March 12 for her brain metastasis. Pt was diagnosed with DM December 22, 2012 when she presented in DKA with  CBG in 700's and missed a subsequent DM class. There was a discussion that her decadron was going to be tapered off and the last day would be April 14, and her insulin would be tapered also with the tapering of the decadron.  Pt's PCP is Dr. Loney Hering.   PCP Dr Fenton Malling [facility] [place] Oncologist Dr Gwenyth Bouillon  Past Medical History  Diagnosis Date  . Atrial fibrillation   . Coronary atherosclerosis of native coronary artery     Nonobstructive, LVEF 60%  . COPD (chronic obstructive pulmonary disease)   . Mixed hyperlipidemia   . Essential hypertension, benign   . Tachycardia-bradycardia syndrome   . Embolism - blood clot   . SOB (shortness of breath) on exertion   . Allergy     Latex  . Asthma   . Anxiety   . Myocardial infarction     12 years ago ?  [name] History of radiation therapy     59 gy fraction 5 eot 07/09/12 right lung  . Lung cancer 04/29/12 dx    RLL needle bx=invasive well diff adenocarcinoma  [facility]  . Adrenal insufficiency     iatrogenic from dexamethasone    Past Surgical History  Procedure Laterality Date  . Appendectomy    . Insert / replace / remove pacemaker  3/11     [employer] - Dr. [name]  . Video bronchoscopy  02/22/2012    Procedure: VIDEO BRONCHOSCOPY WITH FLUORO;  Surgeon: Casimiro Needle  Denice Paradise, MD;  Location: [facility];  Service: Cardiopulmonary;  Laterality: Bilateral;  . Lung biopsy  04/29/12    RLL invasive well diff adenocarcinoma  . Lung biopsy  02/22/12    RML=benign lung parenchyma,no tumor sen  . Tonsillectomy and adenoidectomy      age 18  . Total abdominal hysterectomy  1975    1/2 right ovary intact, left salpingo-oppherctomy  . Mr brain w wo contrast  11/17/2012         Family History  Problem Relation Age of Onset  . Hypertension Mother   . Emphysema Brother     smoker  . Asthma Father   . Asthma Brother   . Lung cancer Paternal Grandmother     was a smoker  . Cancer Maternal Grandmother     lung, dx early 37's, died 3 days after lobectomy     History  Substance Use Topics  . Smoking status: Former Smoker -- 0.25 packs/day for 43 years    Types: Cigarettes    Quit date: 10/08/2012  . Smokeless tobacco: Former Neurosurgeon     Comment: quit 2 weeks ago after smoking since age 83. Up to 1 pack a day  . Alcohol Use: No  Lives at home Lives alone  OB History   Grav Para Term Preterm Abortions TAB SAB Ect Mult Living                  Review of Systems  Constitutional: Positive for fatigue. Negative for fever and chills.  Respiratory: Negative for shortness of breath.   Gastrointestinal: Negative for nausea and vomiting.  Neurological: Negative for weakness.    Allergies  Latex  Home Medications   Current Outpatient Rx  Name  Route  Sig  Dispense  Refill  . albuterol (PROAIR HFA) 108 (90 BASE) MCG/ACT inhaler   Inhalation   Inhale 2 puffs into the lungs every 6 (six) hours as needed for wheezing or shortness of breath.         [name] albuterol (PROVENTIL) (2.5 MG/3ML) 0.083% nebulizer solution   Nebulization   Take 2.5 mg by nebulization every 6 (six) hours as needed for wheezing or shortness of breath.          [name] aspirin EC 81 MG tablet   Oral   Take 81 mg by mouth daily.         . B-D ULTRAFINE III SHORT PEN 31G X 8 MM MISC               . benzonatate (TESSALON) 100 MG capsule   Oral   Take 1 capsule (100 mg total) by mouth 3 (three) times daily as needed for cough.   20 capsule   0   . Blood Glucose Monitoring Suppl (BLOOD GLUCOSE METER) kit      Use as instructed   1 each   0     Reli-on glucometer with 120 test strips and contro ...   . dexamethasone (DECADRON) 4 MG tablet   Oral   Take 0.5 tablets (2 mg total) by mouth 2 (two) times daily with a meal.         . dextromethorphan-guaiFENesin (MUCINEX DM) 30-600 MG per 12 hr tablet   Oral   Take 1 tablet by mouth 2 (two) times daily.   20 tablet   0   . flecainide (TAMBOCOR) 100 MG tablet   Oral   Take 1 tablet (100 mg total) by  mouth 2 (  two) times daily.   60 tablet   5   . folic acid (FOLVITE) 1 MG tablet   Oral   Take 1 tablet (1 mg total) by mouth daily.   30 tablet   2   . glucose 4 GM chewable tablet   Oral   Chew 4 tablets (16 g total) by mouth as needed for low blood sugar.   50 tablet   12   . hyoscyamine (LEVSIN SL) 0.125 MG SL tablet               . insulin aspart protamine-insulin aspart (NOVOLOG MIX 70/30 FLEXPEN) (70-30) 100 UNIT/ML injection   Subcutaneous   Inject 28 Units into the skin 2 (two) times daily with a meal.   10 mL   12     IDDM uncontrolled 250.02 with FS checks qACHS.  Pl ...   . ipratropium (ATROVENT) 0.02 % nebulizer solution   Nebulization   Take 500 mcg by nebulization 4 (four) times daily as needed for wheezing (shortness of breath).          Demetra Shiner Device MISC   Does not apply   1 Device by Does not apply route 4 (four) times daily -  before meals and at bedtime.   2 each   3     IDDM uncontrolled 250.02 with FS checks qACHS.   [name] meclizine (ANTIVERT) 25 MG tablet   Oral   Take 25 mg by mouth 3 (three) times daily as needed.          [name] MELATONIN ER PO   Oral   Take 5 mg by mouth at bedtime.         . nebivolol (BYSTOLIC) 10 MG tablet   Oral   Take 10 mg by mouth daily.          [name] oxyCODONE-acetaminophen (PERCOCET) 10-325 MG per tablet   Oral   Take 1 tablet by mouth every 8 (eight) hours as needed for pain. Takes once daily  And prn for knees   30 tablet   0   . pantoprazole (PROTONIX) 40 MG tablet   Oral   Take 1 tablet (40 mg total) by mouth daily.   30 tablet   2   . potassium chloride SA (KLOR-CON M20) 20 MEQ tablet   Oral   Take 1 tablet (20 mEq total) by mouth daily.   30 tablet   3   . prochlorperazine (COMPAZINE) 10 MG tablet               . rosuvastatin (CRESTOR) 40 MG tablet   Oral   Take 1 tablet (40 mg total) by mouth daily.   30 tablet   11   . TRIBENZOR 40-5-25 MG TABS      take 1 tablet once  daily   30 tablet   5   . warfarin (COUMADIN) 5 MG tablet   Oral   Take 0.5 tablets (2.5 mg total) by mouth daily.   45 tablet   2     BP 101/49  Pulse 66  Resp 14  SpO2 100%  Vital signs normal       Physical Exam  Nursing note and vitals reviewed. Constitutional: She is oriented to person, place, and time. She appears well-developed and well-nourished.  Non-toxic appearance. She does not appear ill. No distress.  HENT:  Head: Normocephalic and atraumatic.  Right Ear: External ear normal.  Left Ear: External ear normal.  Ears:  Nose: Nose normal. No mucosal edema or rhinorrhea.  Mouth/Throat: Oropharynx is clear and moist and mucous membranes are normal. No dental abscesses or edematous.  Minimal hair on scalp, has eyebrow hair  Eyes: Conjunctivae and EOM are normal. Pupils are equal, round, and reactive to light.  Neck: Normal range of motion and full passive range of motion without pain. Neck supple.  Cardiovascular: Normal rate, regular rhythm and normal heart sounds.  Exam reveals no gallop and no friction rub.   No murmur heard. Pulmonary/Chest: Effort normal and breath sounds normal. No respiratory distress. She has no wheezes. She has no rhonchi. She has no rales. She exhibits no tenderness and no crepitus.  Diminished breath sounds bilaterally  Abdominal: Soft. Normal appearance and bowel sounds are normal. She exhibits no distension. There is no tenderness. There is no rebound and no guarding.  Genitourinary:  No ext hemorrhoids, stool is yellow-brown, but is Hemoccult positive  Musculoskeletal: Normal range of motion. She exhibits no edema and no tenderness.  Moves all extremities well.   Neurological: She is alert and oriented to person, place, and time. She has normal strength. No cranial nerve deficit.  Pt was able to identify her address, birthday, the hospital she was in, but unable to identify what location of the hospital.  Skin: Skin is warm, dry and  intact. No rash noted. No erythema. No pallor.  Dry ulcerated oval area on her left ear with an area of redness around it. Faint bruising on bilateral shins, with a couple of scabbed areas present.   Psychiatric: She has a normal mood and affect. Her speech is normal and behavior is normal. Her mood appears not anxious.    ED Course  Procedures (including critical care time) DIAGNOSTIC STUDIES: Oxygen Saturation is 100% on room air, normal by my interpretation.    COORDINATION OF CARE: 11:34 PM Discussed ED treatment with pt and pt agrees.   11:40 PM There was discussion that they were going to be tapering her dethmethazone and she would be stopping it on 4/14 and they advised her as well she would be tapered off of her insulin as they tapered down her steroids. Pt did miss a diabetic education class last week.  00 15 brother is in the emergency department. He states patient lives in an apartment over his garage. He reports his evening they noticed her door was open and her blinds have not been shut. When they checked her she was the couch and her speech was difficult to understand and she seemed confused. They report when they tried to get a refill on her insulin pen they were told she had been using too much and she cannot get her refill for at least another week. Her dexamethasone was cut down to half a pill 6 days ago. End starting tomorrow she is supposed to take a half a pill every other day. They report they have been noticing some confusion. They have decided that the family members are going to start monitoring her medications because they are fearful that she is forgetting what she's taken what she hasn't taken.  01:35 Family states patient is done with radiation, she finished that in Feb. Is currently getting chemotherapy, the last was last week. This may explain her new anemia. She states sometimes her stools are black. Family also suspect she isn't eating. Pt has marked elevation of her  BUN c/w dehydration.   CT angio is unavailable tonight.   Recheck 02:45 BP  in mid 80's systolic, getting her second liter of NS. Has faint wheeze without rales. She denies SOB. States she has had wheezing in the past. She has not had any urine output with the IV fluids. I feel she is still dehydrated.   03:37 Dr Oralia Manis, admit to ICU  Results for orders placed during the hospital encounter of 01/14/13  GLUCOSE, CAPILLARY      Result Value Range   Glucose-Capillary 40 (*) 70 - 99 mg/dL  CBC WITH DIFFERENTIAL      Result Value Range   WBC 6.2  4.0 - 10.5 K/uL   RBC 2.92 (*) 3.87 - 5.11 MIL/uL   Hemoglobin 9.5 (*) 12.0 - 15.0 g/dL   HCT 16.1 (*) 09.6 - 04.5 %   MCV 92.1  78.0 - 100.0 fL   MCH 32.5  26.0 - 34.0 pg   MCHC 35.3  30.0 - 36.0 g/dL   RDW 40.9 (*) 81.1 - 91.4 %   Platelets 174  150 - 400 K/uL   Neutrophils Relative 87 (*) 43 - 77 %   Neutro Abs 5.4  1.7 - 7.7 K/uL   Lymphocytes Relative 11 (*) 12 - 46 %   Lymphs Abs 0.7  0.7 - 4.0 K/uL   Monocytes Relative 2 (*) 3 - 12 %   Monocytes Absolute 0.2  0.1 - 1.0 K/uL   Eosinophils Relative 0  0 - 5 %   Eosinophils Absolute 0.0  0.0 - 0.7 K/uL   Basophils Relative 0  0 - 1 %   Basophils Absolute 0.0  0.0 - 0.1 K/uL  COMPREHENSIVE METABOLIC PANEL      Result Value Range   Sodium 133 (*) 135 - 145 mEq/L   Potassium 3.7  3.5 - 5.1 mEq/L   Chloride 97  96 - 112 mEq/L   CO2 25  19 - 32 mEq/L   Glucose, Bld 325 (*) 70 - 99 mg/dL   BUN 62 (*) 6 - 23 mg/dL   Creatinine, Ser 7.82  0.50 - 1.10 mg/dL   Calcium 8.2 (*) 8.4 - 10.5 mg/dL   Total Protein 5.2 (*) 6.0 - 8.3 g/dL   Albumin 2.7 (*) 3.5 - 5.2 g/dL   AST 26  0 - 37 U/L   ALT 45 (*) 0 - 35 U/L   Alkaline Phosphatase 67  39 - 117 U/L   Total Bilirubin 0.5  0.3 - 1.2 mg/dL   GFR calc non Af Amer 54 (*) >90 mL/min   GFR calc Af Amer 63 (*) >90 mL/min  APTT      Result Value Range   aPTT 31  24 - 37 seconds  PROTIME-INR      Result Value Range   Prothrombin Time 18.9  (*) 11.6 - 15.2 seconds   INR 1.64 (*) 0.00 - 1.49  URINALYSIS, ROUTINE W REFLEX MICROSCOPIC      Result Value Range   Color, Urine YELLOW  YELLOW   APPearance CLEAR  CLEAR   Specific Gravity, Urine >1.030 (*) 1.005 - 1.030   pH 5.5  5.0 - 8.0   Glucose, UA 100 (*) NEGATIVE mg/dL   Hgb urine dipstick NEGATIVE  NEGATIVE   Bilirubin Urine NEGATIVE  NEGATIVE   Ketones, ur NEGATIVE  NEGATIVE mg/dL   Protein, ur NEGATIVE  NEGATIVE mg/dL   Urobilinogen, UA 0.2  0.0 - 1.0 mg/dL   Nitrite NEGATIVE  NEGATIVE   Leukocytes, UA NEGATIVE  NEGATIVE  TROPONIN I  Result Value Range   Troponin I <0.30  <0.30 ng/mL  GLUCOSE, CAPILLARY      Result Value Range   Glucose-Capillary 303 (*) 70 - 99 mg/dL  GLUCOSE, CAPILLARY      Result Value Range   Glucose-Capillary 209 (*) 70 - 99 mg/dL  OCCULT BLOOD, POC DEVICE      Result Value Range   Fecal Occult Bld POSITIVE (*) NEGATIVE   Laboratory interpretation all normal except hypoglycemia on arrival to the ED, hyperglycemia after eating, malnutrition, anemia, subtherapeutic INR   Dg Chest Portable 1 View  01/14/2013  *RADIOLOGY REPORT*  Clinical Data: Altered mental status.  Hypoglycemia and hypotension.  PORTABLE CHEST - 1 VIEW  Comparison: Two-view chest 12/18/2012.  Findings: The heart size is normal. A right middle lobe nodule is stable.  The lungs are otherwise clear.  Pacing wires are stable. The visualized soft tissues and bony thorax are unremarkable.  IMPRESSION:  1.  No acute cardiopulmonary disease. 2.  Right middle lobe pulmonary nodule.   Original Report Authenticated By: Marin Roberts, M.D.      Ct Head Wo Contrast  01/15/2013  *RADIOLOGY REPORT*  Clinical Data: Light seen via.  Confusion.  The patient is received radiation therapy for metastatic lung cancer.  CT HEAD WITHOUT CONTRAST  Technique:  Contiguous axial images were obtained from the base of the skull through the vertex without contrast.  Comparison: CT head without  contrast 11/16/2012  Findings: The right cerebellar mass is slightly decreased in size, now measuring 3.7 x 2.6 cm. Heterogeneity of the lesion suggests some internal hemorrhage, mostly at the periphery.  Peripheral hyperdensity was previously noted.  There is partial effacement of the fourth ventricle.  No additional mass lesions are present. Atrophy and white matter disease is stable. The ventricles are of normal size.  No significant extra-axial fluid collection is present.  The paranasal sinuses and mastoid air cells are clear.  The osseous skull is intact.  IMPRESSION:  1.  Decrease in size of a right cerebellar lesion. 2.  Heterogeneity of the lesions suggests some component of hemorrhage, similar to the prior study.   Original Report Authenticated By: Marin Roberts, M.D.       Date: 01/14/2013  Rate: 65  Rhythm: normal sinus rhythm  QRS Axis: normal  Intervals: normal  ST/T Wave abnormalities: normal  Conduction Disutrbances:none  Narrative Interpretation:   Old EKG Reviewed: changes noted from 3/125/2014 was in afib with RVR      1. Hypoglycemia   2. Dehydration   3. Anemia   4. Confusion   5. Hypotension    Plan admission   CRITICAL CARE Performed by: Devoria Albe L   Total critical care time: 40 min   Critical care time was exclusive of separately billable procedures and treating other patients.  Critical care was necessary to treat or prevent imminent or life-threatening deterioration.  Critical care was time spent personally by me on the following activities: development of treatment plan with patient and/or surrogate as well as nursing, discussions with consultants, evaluation of patient's response to treatment, examination of patient, obtaining history from patient or surrogate, ordering and performing treatments and interventions, ordering and review of laboratory studies, ordering and review of radiographic studies, pulse oximetry and re-evaluation of  patient's condition.    MDM   I personally performed the services described in this documentation, which was scribed in my presence. The recorded information has been reviewed and considered.  Devoria Albe, MD, Armando Gang  Ward Givens, MD 01/15/13 [phone]

## 2013-01-14 NOTE — ED Notes (Signed)
Patient given graham crackers, peanut butter, and orange juice to drink for blood sugar.

## 2013-01-14 NOTE — ED Notes (Signed)
Dr. Knapp in to see patient at this time.  

## 2013-01-14 NOTE — ED Notes (Signed)
Patient presents to ER via [facility] with c/o hypoglycemia and hypotension.  EMS states CBG was 51 on their arrival and SBP was 60.  CBG redone with result of 73 and BP of 111/63 en route to ER.  Patient denies pain at this time.

## 2013-01-15 ENCOUNTER — Encounter (HOSPITAL_COMMUNITY): Payer: Self-pay | Admitting: Internal Medicine

## 2013-01-15 ENCOUNTER — Other Ambulatory Visit: Payer: Medicare Other

## 2013-01-15 ENCOUNTER — Encounter (HOSPITAL_COMMUNITY)
Admission: EM | Disposition: A | Discharge status: Nursing Home (Includes ICF) | Payer: Self-pay | Source: Home / Self Care | Attending: Internal Medicine

## 2013-01-15 ENCOUNTER — Other Ambulatory Visit: Payer: Medicare Other | Admitting: Lab

## 2013-01-15 DIAGNOSIS — E162 Hypoglycemia, unspecified: Secondary | ICD-10-CM

## 2013-01-15 DIAGNOSIS — C801 Malignant (primary) neoplasm, unspecified: Secondary | ICD-10-CM

## 2013-01-15 DIAGNOSIS — R195 Other fecal abnormalities: Secondary | ICD-10-CM

## 2013-01-15 DIAGNOSIS — R571 Hypovolemic shock: Secondary | ICD-10-CM | POA: Diagnosis present

## 2013-01-15 DIAGNOSIS — K259 Gastric ulcer, unspecified as acute or chronic, without hemorrhage or perforation: Secondary | ICD-10-CM

## 2013-01-15 DIAGNOSIS — R578 Other shock: Secondary | ICD-10-CM

## 2013-01-15 DIAGNOSIS — D649 Anemia, unspecified: Secondary | ICD-10-CM

## 2013-01-15 DIAGNOSIS — K2960 Other gastritis without bleeding: Secondary | ICD-10-CM

## 2013-01-15 DIAGNOSIS — C7949 Secondary malignant neoplasm of other parts of nervous system: Secondary | ICD-10-CM

## 2013-01-15 DIAGNOSIS — F05 Delirium due to known physiological condition: Secondary | ICD-10-CM

## 2013-01-15 HISTORY — PX: ESOPHAGOGASTRODUODENOSCOPY: SHX5428

## 2013-01-15 LAB — COMPREHENSIVE METABOLIC PANEL
ALT: 38 U/L — ABNORMAL HIGH (ref 0–35)
ALT: 45 U/L — ABNORMAL HIGH (ref 0–35)
AST: 20 U/L (ref 0–37)
AST: 26 U/L (ref 0–37)
Albumin: 2.7 g/dL — ABNORMAL LOW (ref 3.5–5.2)
Alkaline Phosphatase: 60 U/L (ref 39–117)
CO2: 25 mEq/L (ref 19–32)
Calcium: 7.5 mg/dL — ABNORMAL LOW (ref 8.4–10.5)
Chloride: 97 mEq/L (ref 96–112)
GFR calc non Af Amer: 54 mL/min — ABNORMAL LOW (ref >90)
Glucose, Bld: 90 mg/dL (ref 70–99)
Potassium: 3.8 mEq/L (ref 3.5–5.1)
Sodium: 133 mEq/L — ABNORMAL LOW (ref 135–145)
Sodium: 137 mEq/L (ref 135–145)
Total Bilirubin: 0.5 mg/dL (ref 0.3–1.2)
Total Protein: 4.5 g/dL — ABNORMAL LOW (ref 6.0–8.3)

## 2013-01-15 LAB — URINALYSIS, ROUTINE W REFLEX MICROSCOPIC
Bilirubin Urine: NEGATIVE
Hgb urine dipstick: NEGATIVE
Nitrite: NEGATIVE
Specific Gravity, Urine: >1.03 — ABNORMAL HIGH (ref 1.005–1.030)
Urobilinogen, UA: 0.2 mg/dL (ref 0.0–1.0)
pH: 5.5 (ref 5.0–8.0)

## 2013-01-15 LAB — GLUCOSE, CAPILLARY
Glucose-Capillary: 209 mg/dL — ABNORMAL HIGH (ref 70–99)
Glucose-Capillary: 303 mg/dL — ABNORMAL HIGH (ref 70–99)
Glucose-Capillary: 132 mg/dL — ABNORMAL HIGH (ref 70–99)
Glucose-Capillary: 90 mg/dL (ref 70–99)
Glucose-Capillary: 70 mg/dL (ref 70–99)
Glucose-Capillary: 132 mg/dL — ABNORMAL HIGH (ref 70–99)

## 2013-01-15 LAB — CBC
Hemoglobin: 7.9 g/dL — ABNORMAL LOW (ref 12.0–15.0)
MCH: 31.2 pg (ref 26.0–34.0)
MCHC: 33.8 g/dL (ref 30.0–36.0)
Platelets: 138 10*3/uL — ABNORMAL LOW (ref 150–400)
RBC: 2.53 MIL/uL — ABNORMAL LOW (ref 3.87–5.11)

## 2013-01-15 LAB — TROPONIN I
Troponin I: <0.3 ng/mL (ref <0.30)
Troponin I: <0.3 ng/mL (ref <0.30)

## 2013-01-15 LAB — TSH: TSH: 0.653 u[IU]/mL (ref 0.350–4.500)

## 2013-01-15 LAB — MAGNESIUM: Magnesium: 1.7 mg/dL (ref 1.5–2.5)

## 2013-01-15 SURGERY — EGD (ESOPHAGOGASTRODUODENOSCOPY)
Anesthesia: Moderate Sedation

## 2013-01-15 MED ORDER — SODIUM CHLORIDE 0.9 % IV BOLUS (SEPSIS)
1000.0000 mL | Freq: Once | INTRAVENOUS | Status: AC
  Administered 2013-01-15: 1000 mL via INTRAVENOUS

## 2013-01-15 MED ORDER — SODIUM CHLORIDE 0.9 % IJ SOLN
3.0000 mL | Freq: Two times a day (BID) | INTRAMUSCULAR | Status: DC
  Administered 2013-01-15: 22:00:00 via INTRAVENOUS
  Administered 2013-01-15 – 2013-01-20 (×9): 3 mL via INTRAVENOUS

## 2013-01-15 MED ORDER — FLECAINIDE ACETATE 100 MG PO TABS
100.0000 mg | ORAL_TABLET | Freq: Two times a day (BID) | ORAL | Status: DC
  Administered 2013-01-15 – 2013-01-20 (×11): 100 mg via ORAL
  Filled 2013-01-15 (×17): qty 1

## 2013-01-15 MED ORDER — ONDANSETRON HCL 4 MG/2ML IJ SOLN
INTRAMUSCULAR | Status: AC
  Filled 2013-01-15: qty 2

## 2013-01-15 MED ORDER — IPRATROPIUM BROMIDE 0.02 % IN SOLN: 500.0000 ug | Freq: Four times a day (QID) | RESPIRATORY_TRACT | Status: DC | PRN

## 2013-01-15 MED ORDER — SODIUM CHLORIDE 0.9 % IV BOLUS (SEPSIS)
250.0000 mL | Freq: Once | INTRAVENOUS | Status: AC
  Administered 2013-01-15: 06:00:00 via INTRAVENOUS

## 2013-01-15 MED ORDER — BOOST / RESOURCE BREEZE PO LIQD
1.0000 | Freq: Three times a day (TID) | ORAL | Status: DC
  Administered 2013-01-15 – 2013-01-19 (×10): 1 via ORAL

## 2013-01-15 MED ORDER — MEPERIDINE HCL 100 MG/ML IJ SOLN
INTRAMUSCULAR | Status: AC
  Filled 2013-01-15: qty 2

## 2013-01-15 MED ORDER — FOLIC ACID 1 MG PO TABS
1.0000 mg | ORAL_TABLET | Freq: Every day | ORAL | Status: DC
  Administered 2013-01-15 – 2013-01-20 (×6): 1 mg via ORAL
  Filled 2013-01-15 (×6): qty 1

## 2013-01-15 MED ORDER — BENZONATATE 100 MG PO CAPS: 100.0000 mg | ORAL_CAPSULE | Freq: Three times a day (TID) | ORAL | Status: DC | PRN

## 2013-01-15 MED ORDER — HYDROMORPHONE HCL PF 1 MG/ML IJ SOLN: 0.5000 mg | INTRAMUSCULAR | Status: DC | PRN

## 2013-01-15 MED ORDER — MIDAZOLAM HCL 5 MG/5ML IJ SOLN
INTRAMUSCULAR | Status: AC
  Filled 2013-01-15: qty 10

## 2013-01-15 MED ORDER — MIDAZOLAM HCL 5 MG/5ML IJ SOLN
INTRAMUSCULAR | Status: DC | PRN
  Administered 2013-01-15: 2 mg via INTRAVENOUS

## 2013-01-15 MED ORDER — GLUCAGON HCL (RDNA) 1 MG IJ SOLR
3.0000 mg | Freq: Once | INTRAMUSCULAR | Status: AC
  Administered 2013-01-15: 3 mg via INTRAVENOUS
  Filled 2013-01-15: qty 2
  Filled 2013-01-15: qty 1

## 2013-01-15 MED ORDER — HYDROCORTISONE SOD SUCCINATE 100 MG IJ SOLR
100.0000 mg | Freq: Two times a day (BID) | INTRAMUSCULAR | Status: DC
  Administered 2013-01-15 – 2013-01-17 (×4): 100 mg via INTRAVENOUS
  Filled 2013-01-15 (×5): qty 2

## 2013-01-15 MED ORDER — ATORVASTATIN CALCIUM 40 MG PO TABS
80.0000 mg | ORAL_TABLET | Freq: Every day | ORAL | Status: DC
  Administered 2013-01-15 – 2013-01-19 (×5): 80 mg via ORAL
  Filled 2013-01-15 (×2): qty 2
  Filled 2013-01-15: qty 1
  Filled 2013-01-15 (×3): qty 2

## 2013-01-15 MED ORDER — ONDANSETRON HCL 4 MG/2ML IJ SOLN: 4.0000 mg | INTRAMUSCULAR | Status: DC | PRN

## 2013-01-15 MED ORDER — POTASSIUM CHLORIDE CRYS ER 20 MEQ PO TBCR
20.0000 meq | EXTENDED_RELEASE_TABLET | Freq: Two times a day (BID) | ORAL | Status: DC
  Administered 2013-01-15 (×2): 20 meq via ORAL
  Filled 2013-01-15 (×2): qty 1

## 2013-01-15 MED ORDER — MEPERIDINE HCL 100 MG/ML IJ SOLN
INTRAMUSCULAR | Status: DC | PRN
  Administered 2013-01-15: 25 mg via INTRAVENOUS

## 2013-01-15 MED ORDER — SODIUM CHLORIDE 0.9 % IV SOLN
INTRAVENOUS | Status: DC
  Administered 2013-01-15: 1000 mL via INTRAVENOUS

## 2013-01-15 MED ORDER — INSULIN ASPART 100 UNIT/ML ~~LOC~~ SOLN
0.0000 [IU] | Freq: Every day | SUBCUTANEOUS | Status: DC
  Administered 2013-01-16: 2 [IU] via SUBCUTANEOUS

## 2013-01-15 MED ORDER — PANTOPRAZOLE SODIUM 40 MG IV SOLR
40.0000 mg | INTRAVENOUS | Status: DC
  Administered 2013-01-15: 40 mg via INTRAVENOUS
  Filled 2013-01-15: qty 40

## 2013-01-15 MED ORDER — SODIUM CHLORIDE 0.9 % IV BOLUS (SEPSIS)
500.0000 mL | Freq: Once | INTRAVENOUS | Status: AC
  Administered 2013-01-15: 500 mL via INTRAVENOUS

## 2013-01-15 MED ORDER — INSULIN ASPART 100 UNIT/ML ~~LOC~~ SOLN
0.0000 [IU] | Freq: Three times a day (TID) | SUBCUTANEOUS | Status: DC
  Administered 2013-01-16: 3 [IU] via SUBCUTANEOUS
  Administered 2013-01-16 – 2013-01-17 (×2): 2 [IU] via SUBCUTANEOUS
  Administered 2013-01-17: 3 [IU] via SUBCUTANEOUS
  Administered 2013-01-18 (×3): 2 [IU] via SUBCUTANEOUS
  Administered 2013-01-19 – 2013-01-20 (×3): 1 [IU] via SUBCUTANEOUS
  Administered 2013-01-20: 2 [IU] via SUBCUTANEOUS

## 2013-01-15 MED ORDER — POTASSIUM CHLORIDE IN NACL 20-0.9 MEQ/L-% IV SOLN
INTRAVENOUS | Status: DC
  Administered 2013-01-15 – 2013-01-16 (×3): via INTRAVENOUS
  Administered 2013-01-16: 20 mL/h via INTRAVENOUS

## 2013-01-15 MED ORDER — ONDANSETRON HCL 4 MG/2ML IJ SOLN
INTRAMUSCULAR | Status: DC | PRN
  Administered 2013-01-15: 4 mg via INTRAVENOUS

## 2013-01-15 MED ORDER — PANTOPRAZOLE SODIUM 40 MG IV SOLR
40.0000 mg | Freq: Two times a day (BID) | INTRAVENOUS | Status: DC
  Administered 2013-01-15 (×2): 40 mg via INTRAVENOUS
  Filled 2013-01-15 (×2): qty 40

## 2013-01-15 MED ORDER — ALBUTEROL SULFATE (5 MG/ML) 0.5% IN NEBU: 2.5000 mg | INHALATION_SOLUTION | RESPIRATORY_TRACT | Status: DC | PRN

## 2013-01-15 MED ORDER — TRAZODONE HCL 50 MG PO TABS
50.0000 mg | ORAL_TABLET | Freq: Every evening | ORAL | Status: DC | PRN
  Administered 2013-01-17 – 2013-01-18 (×2): 50 mg via ORAL
  Filled 2013-01-15 (×3): qty 1

## 2013-01-15 MED ORDER — BUTAMBEN-TETRACAINE-BENZOCAINE 2-2-14 % EX AERO
INHALATION_SPRAY | CUTANEOUS | Status: DC | PRN
  Administered 2013-01-15: 2 via TOPICAL

## 2013-01-15 MED ORDER — FLEET ENEMA 7-19 GM/118ML RE ENEM: 1.0000 | ENEMA | Freq: Once | RECTAL | Status: AC | PRN

## 2013-01-15 MED ORDER — DEXAMETHASONE 4 MG PO TABS: 2.0000 mg | ORAL_TABLET | Freq: Every day | ORAL | Status: DC

## 2013-01-15 MED ORDER — STERILE WATER FOR IRRIGATION IR SOLN
Status: DC | PRN
  Administered 2013-01-15: 15:00:00

## 2013-01-15 MED ORDER — HYDROCORTISONE SOD SUCCINATE 100 MG IJ SOLR: 50.0000 mg | Freq: Two times a day (BID) | INTRAMUSCULAR | Status: DC

## 2013-01-15 NOTE — H&P (Signed)
[facility] History and Physical  [name]  WUJ:[id number]  DOB: 1951/03/05   DOA: 01/15/2013   PCP:   Ernestine Conrad, MD  Oncologist: . Arlis Porta M.D.  Chief Complaint:  Confusion and recurrent hypoglycemia during the past 24 hours  HPI: [name] is an 62 y.o. female.   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  EMS was called to the home because of altered mental status; she has not been eating or drinking much today and has been nauseous. They found her hypoglycemic and hypotensive; she received orange juice, and was brought to the emergency room, where she was hypotensive and again hypoglycemic; She was resuscitated, given a meal and her blood sugars are now well into the 200s, but patient remains confused and hypotensive with systolic in the 70s to 80s, despite2 liters normal saline.  The family reports that she recently ran out of her insulin pen, but the pharmacy refused to give a refill, because they say he does not yet due; her family therefore concluded that she's been getting confused and taking excess doses of medications, probably giving herself insulin and forgetting and then asking family to give her again. They have decided to take over management of her medications.   Her only blood pressure medication his bystolic, and it is unclear at this time ago she is been taking too much. There is no history of fever or chills cough or cold, vomiting or diarrhea, but patient is not sufficiently oriented to be able to give a reliable history. She is however sufficiently oriented to state emphatically that she wants full resuscitative measures.  Rewiew of Systems:  Unable to obtain reliable review of systems because of  patient's mental status changes     Past Medical History  Diagnosis Date  . Atrial fibrillation   . Coronary atherosclerosis of native coronary artery     Nonobstructive, LVEF 60%  . COPD (chronic obstructive pulmonary disease)   . Mixed hyperlipidemia   . Essential hypertension, benign   . Tachycardia-bradycardia syndrome   . Embolism - blood clot   . SOB (shortness of breath) on exertion   . Allergy     Latex  . Asthma   . Anxiety   . Myocardial infarction     12 years ago ?  [name] History of radiation therapy     59 gy fraction 5 eot 07/09/12 right lung  . Lung cancer 04/29/12 dx    RLL needle bx=invasive well diff adenocarcinoma  [facility]  . Adrenal insufficiency     iatrogenic from dexamethasone    Past Surgical History  Procedure Laterality Date  . Appendectomy    . Insert / replace / remove pacemaker  3/11     [employer] - Dr. [name]  . Video bronchoscopy  02/22/2012    Procedure: VIDEO BRONCHOSCOPY WITH FLUORO;  Surgeon: Nyoka Cowden, MD;  Location: [facility];  Service: Cardiopulmonary;  Laterality: Bilateral;  . Lung biopsy  04/29/12    RLL invasive well diff adenocarcinoma  . Lung biopsy  02/22/12    RML=benign lung parenchyma,no tumor sen  . Tonsillectomy and adenoidectomy      age 29  . Total abdominal hysterectomy  1975    1/2 right ovary intact, left salpingo-oppherctomy  .  Mr brain w wo contrast  11/17/2012         Medications:  HOME MEDS: Prior to Admission medications   Medication Sig Start Date End Date Taking? Authorizing Provider  albuterol (PROAIR HFA) 108 (90 BASE) MCG/ACT inhaler Inhale 2 puffs into the lungs every 6 (six) hours as needed for wheezing or shortness of breath.    Historical Provider, MD  albuterol (PROVENTIL) (2.5 MG/3ML) 0.083% nebulizer solution Take 2.5 mg by nebulization every 6 (six) hours as needed for wheezing or shortness of breath.     Historical Provider, MD  aspirin EC 81 MG tablet Take 81 mg by mouth daily.    Historical  Provider, MD  B-D ULTRAFINE III SHORT PEN 31G X 8 MM MISC  12/24/12   Historical Provider, MD  benzonatate (TESSALON) 100 MG capsule Take 1 capsule (100 mg total) by mouth 3 (three) times daily as needed for cough. 12/22/12   Renae Fickle, MD  Blood Glucose Monitoring Suppl (BLOOD GLUCOSE METER) kit Use as instructed 12/22/12   Renae Fickle, MD  dexamethasone (DECADRON) 4 MG tablet Take 0.5 tablets (2 mg total) by mouth 2 (two) times daily with a meal. 12/23/12   Renae Fickle, MD  dextromethorphan-guaiFENesin [facility] DM) 30-600 MG per 12 hr tablet Take 1 tablet by mouth 2 (two) times daily. 12/22/12   Renae Fickle, MD  flecainide (TAMBOCOR) 100 MG tablet Take 1 tablet (100 mg total) by mouth 2 (two) times daily. 08/13/12   Jonelle Sidle, MD  folic acid (FOLVITE) 1 MG tablet Take 1 tablet (1 mg total) by mouth daily. 01/09/13   Si Gaul, MD  glucose 4 GM chewable tablet Chew 4 tablets (16 g total) by mouth as needed for low blood sugar. 12/23/12   Renae Fickle, MD  hyoscyamine (LEVSIN SL) 0.125 MG SL tablet  11/11/12   Historical Provider, MD  insulin aspart protamine-insulin aspart (NOVOLOG MIX 70/30 FLEXPEN) (70-30) 100 UNIT/ML injection Inject 28 Units into the skin 2 (two) times daily with a meal. 12/22/12   Renae Fickle, MD  ipratropium (ATROVENT) 0.02 % nebulizer solution Take 500 mcg by nebulization 4 (four) times daily as needed for wheezing (shortness of breath).     Historical Provider, MD  Lancet Device MISC 1 Device by Does not apply route 4 (four) times daily -  before meals and at bedtime. 12/22/12   Renae Fickle, MD  meclizine (ANTIVERT) 25 MG tablet Take 25 mg by mouth 3 (three) times daily as needed.  11/12/12   Historical Provider, MD  MELATONIN ER PO Take 5 mg by mouth at bedtime.    Historical Provider, MD  nebivolol (BYSTOLIC) 10 MG tablet Take 10 mg by mouth daily.  05/19/12   Nyoka Cowden, MD  oxyCODONE-acetaminophen (PERCOCET) 10-325 MG per tablet Take 1  tablet by mouth every 8 (eight) hours as needed for pain. Takes once daily  And prn for knees 12/22/12   Renae Fickle, MD  pantoprazole (PROTONIX) 40 MG tablet Take 1 tablet (40 mg total) by mouth daily. 11/28/12   Oneita Hurt, MD  potassium chloride SA (KLOR-CON M20) 20 MEQ tablet Take 1 tablet (20 mEq total) by mouth daily. 01/08/13   Marinus Maw, MD  prochlorperazine (COMPAZINE) 10 MG tablet  12/16/12   Historical Provider, MD  rosuvastatin (CRESTOR) 40 MG tablet Take 1 tablet (40 mg total) by mouth daily. 06/10/12   Jodelle Gross, NP  TRIBENZOR 40-5-25 MG TABS take 1 tablet once daily  10/04/12   Nyoka Cowden, MD  warfarin (COUMADIN) 5 MG tablet Take 0.5 tablets (2.5 mg total) by mouth daily. 12/22/12   Renae Fickle, MD     Allergies:  Allergies  Allergen Reactions  . Latex     "pulls skin off"    Social History:   reports that she quit smoking about 3 months ago. Her smoking use included Cigarettes. She has a 10.75 pack-year smoking history. She has quit using smokeless tobacco. She reports that she does not drink alcohol or use illicit drugs.  Family History: Family History  Problem Relation Age of Onset  . Hypertension Mother   . Emphysema Brother     smoker  . Asthma Father   . Asthma Brother   . Lung cancer Paternal Grandmother     was a smoker  . Cancer Maternal Grandmother     lung, dx early 3's, died 3 days after lobectomy     Physical Exam: Filed Vitals:   01/15/13 0325 01/15/13 0345 01/15/13 0400 01/15/13 0415  BP: 80/47 90/37 87/41  82/40  Pulse: 64 66 63 64  Resp: 15 14 14 14   SpO2: 100% 100% 100% 100%   Blood pressure 82/40, pulse 64, resp. rate 14, SpO2 100.00%.  GEN:  Pleasant obese Caucasian lady lying bed, in Trendelenburg position, in no acute distress; cooperative with exam PSYCH:  alert and oriented x2;  appears somewhat anxious and tearful; HEENT: Mucous membranes pink DRY and anicteric; PERRLA; EOM intact; no cervical  lymphadenopathy nor thyromegaly or carotid bruit; no JVD; almost complete alopecia of the scalp,  Breasts:: Not examined CHEST WALL: No tenderness CHEST: Normal respiration, clear to auscultation bilaterally HEART: Regular rate and rhythm; no murmurs rubs or gallops BACK: ; no CVA tenderness ABDOMEN: Obese, soft non-tender; no masses, no organomegaly, normal abdominal bowel sounds; no intertriginous candida. Rectal Exam: Not done EXTREMITIES:  age-appropriate arthropathy of the hands and knees; no edema; no ulcerations. Genitalia: not examined PULSES: 2+ and symmetric SKIN: Normal hydration no rash or ulceration; some bruising of the extremity CNS: Cranial nerves 2-12 grossly intact no focal lateralizing neurologic deficit   Labs on Admission:  Basic Metabolic Panel:  Recent Labs Lab 01/08/13 1318 01/14/13 2335  NA 133* 133*  K 4.4 3.7  CL 100 97  CO2 23 25  GLUCOSE 144* 325*  BUN 29.8* 62*  CREATININE 0.9 1.07  CALCIUM 8.6 8.2*   Liver Function Tests:  Recent Labs Lab 01/08/13 1318 01/14/13 2335  AST 22 26  ALT 69* 45*  ALKPHOS 85 67  BILITOT 0.42 0.5  PROT 5.6* 5.2*  ALBUMIN 2.7* 2.7*   No results found for this basename: LIPASE, AMYLASE,  in the last 168 hours No results found for this basename: AMMONIA,  in the last 168 hours CBC:  Recent Labs Lab 01/08/13 1318 01/14/13 2335  WBC 30.3* 6.2  NEUTROABS 26.9* 5.4  HGB 10.5* 9.5*  HCT 31.1* 26.9*  MCV 93.3 92.1  PLT 247 174   Cardiac Enzymes:  Recent Labs Lab 01/14/13 2335  TROPONINI <0.30   BNP: No components found with this basename: POCBNP,  D-dimer: No components found with this basename: D-DIMER,  CBG:  Recent Labs Lab 01/14/13 2319 01/14/13 2349 01/15/13 0132 01/15/13 0413  GLUCAP 40* 303* 209* 147*    Radiological Exams on Admission: Ct Head Wo Contrast  01/15/2013  *RADIOLOGY REPORT*  Clinical Data: Light seen via.  Confusion.  The patient is received radiation therapy for  metastatic lung cancer.  CT HEAD WITHOUT CONTRAST  Technique:  Contiguous axial images were obtained from the base of the skull through the vertex without contrast.  Comparison: CT head without contrast 11/16/2012  Findings: The right cerebellar mass is slightly decreased in size, now measuring 3.7 x 2.6 cm. Heterogeneity of the lesion suggests some internal hemorrhage, mostly at the periphery.  Peripheral hyperdensity was previously noted.  There is partial effacement of the fourth ventricle.  No additional mass lesions are present. Atrophy and white matter disease is stable. The ventricles are of normal size.  No significant extra-axial fluid collection is present.  The paranasal sinuses and mastoid air cells are clear.  The osseous skull is intact.  IMPRESSION:  1.  Decrease in size of a right cerebellar lesion. 2.  Heterogeneity of the lesions suggests some component of hemorrhage, similar to the prior study.   Original Report Authenticated By: Marin Roberts, M.D.    Dg Chest Portable 1 View  01/14/2013  *RADIOLOGY REPORT*  Clinical Data: Altered mental status.  Hypoglycemia and hypotension.  PORTABLE CHEST - 1 VIEW  Comparison: Two-view chest 12/18/2012.  Findings: The heart size is normal. A right middle lobe nodule is stable.  The lungs are otherwise clear.  Pacing wires are stable. The visualized soft tissues and bony thorax are unremarkable.  IMPRESSION:  1.  No acute cardiopulmonary disease. 2.  Right middle lobe pulmonary nodule.   Original Report Authenticated By: Marin Roberts, M.D.     EKG: Independently reviewed. Normal sinus rhythm; no pacemaker spikes noted   Assessment/Plan Present on Admission:  Hypovolemic shock, with bradycardia suggesting a possible beta blocker overdose  We'll give another 1 L bolus of IV fluids and monitor response; consider glucagon if response is not adequate; she does look clinically dehydrated although she denies thirst. consider pressors if does  not respond to glucagon  Her BUN is elevated and she is on full anticoagulation; although her INR is subtherapeutic. Upper GI bleed is to be considered as possibly contributing to her hypovolemia; will follow up on her morning labs;   [name] Steroid-induced hyperglycemia, now with recurrent episodes of hypoglycemia   we'll maintain her on a diabetic diet with sensitive sliding scale since her blood sugars are now in the 200s; but we'll keep a close watch on her blood sugar; will not restart long-acting insulin this time.  . 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 Delirium secondary to multiple medical problems   supportive care and treatment of underlying condition; monitor for hypoglycemia; . paroxysmal Atrial fibrillation, on warfarin  Currently in sinus rhythm, INR subtherapeutic but will withhold onto a GI bleed has been ruled out   . CORONARY ATHEROSCLEROSIS NATIVE CORONARY ARTERY  Cycle cardiac enzymes in view of her marked hypotension  . OVERWEIGHT/OBESITY . Lung cancer . HYPERLIPIDEMIA . Essential hypertension, benign . COPD (chronic obstructive pulmonary disease) . Cardiac pacemaker in situ   PLAN:  continue management of chronic medical conditions   Other plans as per orders.  Code Status: FULL CODE  Family Communication:  no family available at the time of admission  Disposition Plan:  managed in intensive care unit   Critical care time: 60 minutes.   Laquana Villari Nocturnist [facility] Pager [phone]   01/15/2013, 4:33 AM

## 2013-01-15 NOTE — Clinical Social Work Psychosocial (Signed)
Clinical Social Work Department BRIEF PSYCHOSOCIAL ASSESSMENT 01/15/2013  Patient:  [name], [name]     Account Number:  [id number]     Admit date:  01/14/2013  Clinical Social Worker:  Santa Genera, CLINICAL SOCIAL WORKER  Date/Time:  01/15/2013 11:00 AM  Referred by:  Physician  Date Referred:  01/14/2013 Referred for  ALF Placement   Other Referral:   Interview type:  Family Other interview type:   Also spoke w [facility] social worker Sharen Hint [phone]) and patient    PSYCHOSOCIAL DATA Living Status:  ALONE Admitted from facility:   Level of care:   Primary support name:  [name] and Rosey Bath Trollinger Primary support relationship to patient:  SIBLING Degree of support available:   Inadequate to meet current patient care needs    CURRENT CONCERNS Current Concerns  Post-Acute Placement   Other Concerns:    SOCIAL WORK ASSESSMENT / PLAN CSW met w patient at bedside, patient oriented to self and place, not time.  Slow of speech and states she has trouble w "focus" - "I need to wait until I can focus my thoughts." Cannot remember events in past, such as where she worked. States she has no children.  States "I am scared of making that decision" but cannot say what decision is about. Becomes tearful at times, but cannot say what she is concerned about.  Says she has been in "bad shape at home, as far as it goes" but cannot say what she means by that. Admits to being very tired.  Is currently in chemotherapy at [facility] in [place], oncologist is Mohammed MD.  Sister in law drives her to tx once/week.  Also knows she has diabetes but mumbles when asked what she has to do to care for herself.  Asked CSW to talk w her sister in law, Rosey Bath, about her home situation.    CSW spoke w patient's brother on face sheet.  Confirms that his wife does help w getting patient to chemo.  Patient cannot drive any longer.  Says that patient is  living independently in upstairs garage  apartment.  Married x3, one child deceased another living in [place] and has not contacted patient in "years."  Only family involved in her care are sister and brother living in [place] as well as their spouses.    Brother asked that CSW talk w [facility] SW Sharen Hint who assessed patient in home yesterday and was in process of referring for ALF placement.  Says patient cannot live at home any longer and has declined markedly rapidly. Yesterday, family removed gun and different prescription drugs.  Familiy and SW feel that patient has not been taking her diabetes medications as instructed; patient may not be eating regularly.  Up to this point, patient has not been open to family being involved in her life and has refused much assistance.  However, yesterday, patient indicated that she was willing to go to ALF.  SW was contacting [facility] to see if they would take patient. Patient receives SSDI, $1060/month and has Medicare but not Medicaid.  Family is reportedly initiating Medicaid application and patient has given them access to her financial information to assist.    [facility] SW strongly recommends that patient not return home at discharge and states that patient is unable to care for herself independently at this time.  CSW will work w MD and PT to assess patient level of care need at discharge and proceed w appropriate discharge  placement planning.   Assessment/plan status:  Psychosocial Support/Ongoing Assessment of Needs Other assessment/ plan:   Information/referral to community resources:   SNF list    PATIENT'S/FAMILY'S RESPONSE TO PLAN OF CARE: Unable to assess patient, family appreciative of assistance.    Santa Genera, LCSW Clinical Social Worker [phone])

## 2013-01-15 NOTE — Progress Notes (Addendum)
Subjective: This lady was admitted yesterday with hypotension and hypoglycemia, which seems to have improved now. From her previous admission she was on a taper of Decadron. She came in confused. It is not clear whether she had been taking too many of her antihypertensive medications. Her blood glucose seems to have stabilized. She appears to be still confused per nursing staff.           Physical Exam: Blood pressure 92/51, pulse 61, temperature 97.6 F (36.4 C), temperature source Oral, resp. rate 14, height 5\' 2"  (1.575 m), weight 75.6 kg (166 lb 10.7 oz), SpO2 100.00%. Her periphery is a somewhat clammy. Pulses are weak. She is in sinus rhythm with a rate of 60-70. Her blood pressure has improved since admission. Heart sounds are present without murmurs or gallop rhythm. Lung fields are clinically clear. Abdomen is soft and possibly tender in a generalized fashion. She is alert but appears to be somewhat inconsistent with her answers to my questions.   Investigations:  Recent Results (from the past 240 hour(s))  TECHNOLOGIST REVIEW     Status: None   Collection Time    01/08/13  1:18 PM      Result Value Range Status   Technologist Review Metas and Myelocytes present   Final  MRSA PCR SCREENING     Status: None   Collection Time    01/15/13  4:53 AM      Result Value Range Status   MRSA by PCR NEGATIVE  NEGATIVE Final   Comment:            The GeneXpert MRSA Assay (FDA     approved for NASAL specimens     only), is one component of a     comprehensive MRSA colonization     surveillance program. It is not     intended to diagnose MRSA     infection nor to guide or     monitor treatment for     MRSA infections.     Basic Metabolic Panel:  Recent Labs  45/40/98 2335 01/15/13 0624  NA 133* 137  K 3.7 3.8  CL 97 107  CO2 25 22  GLUCOSE 325* 90  BUN 62* 45*  CREATININE 1.07 0.77  CALCIUM 8.2* 7.5*  MG  --  1.7   Liver Function Tests:  Recent Labs  01/14/13 2335 01/15/13 0624  AST 26 20  ALT 45* 38*  ALKPHOS 67 60  BILITOT 0.5 0.3  PROT 5.2* 4.5*  ALBUMIN 2.7* 2.2*     CBC:  Recent Labs  01/14/13 2335 01/15/13 0624  WBC 6.2 3.8*  NEUTROABS 5.4  --   HGB 9.5* 7.9*  HCT 26.9* 23.4*  MCV 92.1 92.5  PLT 174 138*    Ct Head Wo Contrast  01/15/2013  *RADIOLOGY REPORT*  Clinical Data: Light seen via.  Confusion.  The patient is received radiation therapy for metastatic lung cancer.  CT HEAD WITHOUT CONTRAST  Technique:  Contiguous axial images were obtained from the base of the skull through the vertex without contrast.  Comparison: CT head without contrast 11/16/2012  Findings: The right cerebellar mass is slightly decreased in size, now measuring 3.7 x 2.6 cm. Heterogeneity of the lesion suggests some internal hemorrhage, mostly at the periphery.  Peripheral hyperdensity was previously noted.  There is partial effacement of the fourth ventricle.  No additional mass lesions are present. Atrophy and white matter disease is stable. The ventricles are of normal size.  No significant extra-axial fluid collection is present.  The paranasal sinuses and mastoid air cells are clear.  The osseous skull is intact.  IMPRESSION:  1.  Decrease in size of a right cerebellar lesion. 2.  Heterogeneity of the lesions suggests some component of hemorrhage, similar to the prior study.   Original Report Authenticated By: Marin Roberts, M.D.    Dg Chest Portable 1 View  01/14/2013  *RADIOLOGY REPORT*  Clinical Data: Altered mental status.  Hypoglycemia and hypotension.  PORTABLE CHEST - 1 VIEW  Comparison: Two-view chest 12/18/2012.  Findings: The heart size is normal. A right middle lobe nodule is stable.  The lungs are otherwise clear.  Pacing wires are stable. The visualized soft tissues and bony thorax are unremarkable.  IMPRESSION:  1.  No acute cardiopulmonary disease. 2.  Right middle lobe pulmonary nodule.   Original Report Authenticated By:  Marin Roberts, M.D.       Medications: I have reviewed the patient's current medications.  Impression: 1. Hypovolemic shock, improving. 2. Anemia with decreasing hemoglobin. Fecal occult blood positive. There is no obvious GI bleed outwardly but increased BUN and decreasing hemoglobin is somewhat concerning. 3. Possible adrenal insufficiency based on recent taper of Decadron. 4. 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 Still undergoing active chemotherapy. 5. History of atrial fibrillation, on anticoagulation. 6. History of coronary artery disease, stable. 7. COPD, stable. A. Obesity.     Plan: 1. Continue with intravenous normal saline. 2. Monitor glucose closely. 3. Start Hydrocortisone 100mg  iv twice a day. 4. Gastroenterology consultation. 5. Oncology consultation.     LOS: 1 day   Wilson Singer Pager [phone]  01/15/2013, 7:37 AM

## 2013-01-15 NOTE — Care Management Note (Signed)
    Page 1 of 1   01/20/2013     1:54:29 PM   CARE MANAGEMENT NOTE 01/20/2013  Patient:  [name], [name]   Account Number:  [id number]  Date Initiated:  01/15/2013  Documentation initiated by:  Rosemary Holms  Subjective/Objective Assessment:   Pt admitted home alone. Spoke with pt about possible Home Health and when agencies were reviewed with her she stated she has [employer] as Higher education careers adviser. Call placed to [facility] [place]/CS representative to confirm. CSW talking with family re: Hm sit.     Action/Plan:   Anticipated DC Date:  01/20/2013   Anticipated DC Plan:  ASSISTED LIVING / REST HOME  In-house referral  Clinical Social Worker      DC Planning Services  CM consult      Choice offered to / List presented to:             Status of service:  Completed, signed off Medicare Important Message given?  YES (If response is "NO", the following Medicare IM given date fields will be blank) Date Medicare IM given:  01/16/2013 Date Additional Medicare IM given:  01/20/2013  Discharge Disposition:  ASSISTED LIVING  Per UR Regulation:    If discussed at Long Length of Stay Meetings, dates discussed:   01/20/2013    Comments:  01/20/13 Rosemary Holms RN BSN CM Prior to admission, pt was active with [facility]. [facility] alerted pt was being dc'd to [place] in [place].  01/15/13 Rosemary Holms RN BSN CM

## 2013-01-15 NOTE — Consult Note (Signed)
Referring Provider: Wilson Singer, MD Primary Care Physician:  Ernestine Conrad, MD Primary Gastroenterologist:  Dr. Karilyn Cota (we are covering for his this week).  Reason for Consultation:  Heme positive stool, decline in H/H, elevated BUN  HPI: [name] is a 62 y.o. female admitted yesterday with alterned mental status, hypoglycemia, hypotension. She has a history of metastatic non-small cell lung cancer initially diagnosed in July 2013. She has had stereotactic radiotherapy to the right lung lesions, completed in October of 2013. She completed whole brain irradiation on 12/04/2012. Started on Decadron at that time. She started systemic chemotherapy with carboplatin for AUC of 5 and Alimta 500mg /M2 every 3 weeks.  Chemo started 12/18/12. Admitted same day with CBGs in the 700s, DKA due to steroid induced hyperglycemia.  This admission over the past 12 hours she has been predominantly hypotensive. BPs 62-147/24-118. Pulse in the 60s. There was some concern per prior notes, that patient may have taken her medications inappropriately.   We were consulted regarding declining H/H, rising BUN, heme positive stool. Patient's history may be somewhat unreliable. She thinks she is at [facility]. She knows her name and answers most questions appropriately. Has difficulty recalling some details of her history. She denies heartburn, dysphagia, odynophagia, abdominal pain, melena, brbpr, constipation, diarrhea. At baseline she has IBS per Dorene Ar, NP with Dr. Karilyn Cota. Also reportedly her last colonoscopy was in 07/2009: pancolonic diverticulosis.   Prior to Admission medications   Medication Sig Start Date End Date Taking? Authorizing Provider  albuterol (PROAIR HFA) 108 (90 BASE) MCG/ACT inhaler Inhale 2 puffs into the lungs every 6 (six) hours as needed for wheezing or shortness of breath.    Historical Provider, MD  albuterol (PROVENTIL) (2.5 MG/3ML) 0.083% nebulizer solution Take 2.5 mg by nebulization every 6  (six) hours as needed for wheezing or shortness of breath.     Historical Provider, MD  aspirin EC 81 MG tablet Take 81 mg by mouth daily.    Historical Provider, MD  B-D ULTRAFINE III SHORT PEN 31G X 8 MM MISC  12/24/12   Historical Provider, MD  benzonatate (TESSALON) 100 MG capsule Take 1 capsule (100 mg total) by mouth 3 (three) times daily as needed for cough. 12/22/12   Renae Fickle, MD  Blood Glucose Monitoring Suppl (BLOOD GLUCOSE METER) kit Use as instructed 12/22/12   Renae Fickle, MD  dexamethasone (DECADRON) 4 MG tablet Take 0.5 tablets (2 mg total) by mouth 2 (two) times daily with a meal. 12/23/12   Renae Fickle, MD  dextromethorphan-guaiFENesin [facility] DM) 30-600 MG per 12 hr tablet Take 1 tablet by mouth 2 (two) times daily. 12/22/12   Renae Fickle, MD  flecainide (TAMBOCOR) 100 MG tablet Take 1 tablet (100 mg total) by mouth 2 (two) times daily. 08/13/12   Jonelle Sidle, MD  folic acid (FOLVITE) 1 MG tablet Take 1 tablet (1 mg total) by mouth daily. 01/09/13   Si Gaul, MD  glucose 4 GM chewable tablet Chew 4 tablets (16 g total) by mouth as needed for low blood sugar. 12/23/12   Renae Fickle, MD  hyoscyamine (LEVSIN SL) 0.125 MG SL tablet  11/11/12   Historical Provider, MD  insulin aspart protamine-insulin aspart (NOVOLOG MIX 70/30 FLEXPEN) (70-30) 100 UNIT/ML injection Inject 28 Units into the skin 2 (two) times daily with a meal. 12/22/12   Renae Fickle, MD  ipratropium (ATROVENT) 0.02 % nebulizer solution Take 500 mcg by nebulization 4 (four) times daily as needed for wheezing (shortness  of breath).     Historical Provider, MD  Lancet Device MISC 1 Device by Does not apply route 4 (four) times daily -  before meals and at bedtime. 12/22/12   Renae Fickle, MD  meclizine (ANTIVERT) 25 MG tablet Take 25 mg by mouth 3 (three) times daily as needed.  11/12/12   Historical Provider, MD  MELATONIN ER PO Take 5 mg by mouth at bedtime.    Historical Provider, MD   nebivolol (BYSTOLIC) 10 MG tablet Take 10 mg by mouth daily.  05/19/12   Nyoka Cowden, MD  oxyCODONE-acetaminophen (PERCOCET) 10-325 MG per tablet Take 1 tablet by mouth every 8 (eight) hours as needed for pain. Takes once daily  And prn for knees 12/22/12   Renae Fickle, MD  pantoprazole (PROTONIX) 40 MG tablet Take 1 tablet (40 mg total) by mouth daily. 11/28/12   Oneita Hurt, MD  potassium chloride SA (KLOR-CON M20) 20 MEQ tablet Take 1 tablet (20 mEq total) by mouth daily. 01/08/13   Marinus Maw, MD  prochlorperazine (COMPAZINE) 10 MG tablet  12/16/12   Historical Provider, MD  rosuvastatin (CRESTOR) 40 MG tablet Take 1 tablet (40 mg total) by mouth daily. 06/10/12   Jodelle Gross, NP  TRIBENZOR 40-5-25 MG TABS take 1 tablet once daily 10/04/12   Nyoka Cowden, MD  warfarin (COUMADIN) 5 MG tablet Take 0.5 tablets (2.5 mg total) by mouth daily. 12/22/12   Renae Fickle, MD    Current Facility-Administered Medications  Medication Dose Route Frequency Provider Last Rate Last Dose  . 0.9 % NaCl with KCl 20 mEq/ L  infusion   Intravenous Continuous Wilson Singer, MD 100 mL/hr at 01/15/13 0824    . albuterol (PROVENTIL) (5 MG/ML) 0.5% nebulizer solution 2.5 mg  2.5 mg Nebulization Q4H PRN Vania Rea, MD      . atorvastatin (LIPITOR) tablet 80 mg  80 mg Oral q1800 Vania Rea, MD      . benzonatate (TESSALON) capsule 100 mg  100 mg Oral TID PRN Vania Rea, MD      . dexamethasone (DECADRON) tablet 2 mg  2 mg Oral Daily Nimish C Gosrani, MD      . flecainide (TAMBOCOR) tablet 100 mg  100 mg Oral BID Vania Rea, MD   100 mg at 01/15/13 1031  . folic acid (FOLVITE) tablet 1 mg  1 mg Oral Daily Vania Rea, MD   1 mg at 01/15/13 1031  . HYDROmorphone (DILAUDID) injection 0.5 mg  0.5 mg Intravenous Q4H PRN Nimish C Gosrani, MD      . insulin aspart (novoLOG) injection 0-5 Units  0-5 Units Subcutaneous QHS Vania Rea, MD      . insulin aspart (novoLOG)  injection 0-9 Units  0-9 Units Subcutaneous TID WC Vania Rea, MD      . ipratropium (ATROVENT) nebulizer solution 500 mcg  500 mcg Nebulization QID PRN Vania Rea, MD      . ondansetron [facility]) injection 4 mg  4 mg Intravenous Q4H PRN Vania Rea, MD      . pantoprazole (PROTONIX) injection 40 mg  40 mg Intravenous Q24H Vania Rea, MD   40 mg at 01/15/13 7829  . potassium chloride SA (K-DUR,KLOR-CON) CR tablet 20 mEq  20 mEq Oral BID Vania Rea, MD   20 mEq at 01/15/13 1031  . sodium chloride 0.9 % bolus 500 mL  500 mL Intravenous Once Wilson Singer, MD   500 mL at 01/15/13 0909  .  sodium chloride 0.9 % injection 3 mL  3 mL Intravenous Q12H Vania Rea, MD   3 mL at 01/15/13 1034  . sodium phosphate (FLEET) 7-19 GM/118ML enema 1 enema  1 enema Rectal Once PRN Vania Rea, MD      . traZODone (DESYREL) tablet 50 mg  50 mg Oral QHS PRN Vania Rea, MD        Allergies as of 01/14/2013 - Review Complete 01/14/2013  Allergen Reaction Noted  . Latex  09/28/2011    Past Medical History  Diagnosis Date  . Atrial fibrillation   . Coronary atherosclerosis of native coronary artery     Nonobstructive, LVEF 60%  . COPD (chronic obstructive pulmonary disease)   . Mixed hyperlipidemia   . Essential hypertension, benign   . Tachycardia-bradycardia syndrome   . Embolism - blood clot     PE  . SOB (shortness of breath) on exertion   . Allergy     Latex  . Asthma   . Anxiety   . Myocardial infarction     12 years ago ?  [name] History of radiation therapy     59 gy fraction 5 eot 07/09/12 right lung  . Lung cancer 04/29/12 dx    RLL needle bx=invasive well diff adenocarcinoma  nscca  . Adrenal insufficiency     iatrogenic from dexamethasone    Past Surgical History  Procedure Laterality Date  . Appendectomy    . Insert / replace / remove pacemaker  3/11     St. Jude - Dr. [name]  . Video bronchoscopy  02/22/2012    Procedure: VIDEO BRONCHOSCOPY  WITH FLUORO;  Surgeon: Nyoka Cowden, MD;  Location: [facility];  Service: Cardiopulmonary;  Laterality: Bilateral;  . Lung biopsy  04/29/12    RLL invasive well diff adenocarcinoma  . Lung biopsy  02/22/12    RML=benign lung parenchyma,no tumor sen  . Tonsillectomy and adenoidectomy      age 31  . Total abdominal hysterectomy  1975    1/2 right ovary intact, left salpingo-oppherctomy  . Mr brain w wo contrast  11/17/2012         Family History  Problem Relation Age of Onset  . Hypertension Mother   . Emphysema Brother     smoker  . Asthma Father   . Asthma Brother   . Lung cancer Paternal Grandmother     was a smoker  . Cancer Maternal Grandmother     lung, dx early 71's, died 3 days after lobectomy    History   Social History  . Marital Status: Widowed    Spouse Name: N/A    Number of Children: 1  . Years of Education: N/A   Occupational History  . Unemployed    Social History Main Topics  . Smoking status: Former Smoker -- 0.25 packs/day for 43 years    Types: Cigarettes    Quit date: 10/08/2012  . Smokeless tobacco: Former Neurosurgeon     Comment: quit 2 weeks ago after smoking since age 89. Up to 1 pack a day  . Alcohol Use: No  . Drug Use: No     Comment: will quit labor day stated  patient 06/09/12  . Sexually Active: No     Comment: menses age 66, first pregnamcy age 45, HRT 4-5 years,premarin,stopped 2000 or 2001   Other Topics Concern  . Not on file   Social History Narrative  . No narrative on file     ROS:  General: C/O fatigue, weight loss, weakness. No fever, chills. Eyes: Negative for vision changes.  ENT: Negative for hoarseness, difficulty swallowing , nasal congestion. CV: Negative for chest pain, angina, palpitations, dyspnea on exertion, peripheral edema.  Respiratory: Negative for dyspnea at rest, dyspnea on exertion, cough, sputum, wheezing.  GI: See history of present illness. GU:  Negative for dysuria, hematuria, urinary incontinence,  urinary frequency, nocturnal urination.  MS: Negative for joint pain, low back pain.  Derm: Negative for rash or itching.  Neuro: Negative for weakness, abnormal sensation, seizure, frequent headaches, memory loss, + confusion.  Psych: Negative for anxiety, depression, suicidal ideation, hallucinations.  Endo: Negative for unusual weight change.  Heme: Negative for bruising or bleeding. Allergy: Negative for rash or hives.       Physical Examination: Vital signs in last 24 hours: Temp:  [97.6 F (36.4 C)-98 F (36.7 C)] 98 F (36.7 C) (04/10 0800) Pulse Rate:  [61-78] 65 (04/10 1030) Resp:  [12-19] 15 (04/10 1030) BP: (61-147)/(24-118) 94/59 mmHg (04/10 1030) SpO2:  [99 %-100 %] 100 % (04/10 1030) Weight:  [166 lb 10.7 oz (75.6 kg)] 166 lb 10.7 oz (75.6 kg) (04/10 0504) Last BM Date: 01/14/13  General: Well-nourished, well-developed in no acute distress. Rounded face.  Head: Normocephalic, atraumatic.   Eyes: Conjunctiva pale, no icterus. Mouth: Oropharyngeal mucosa DRY and pink , no lesions erythema or exudate.  Neck: Supple without thyromegaly, masses, or lymphadenopathy.  Lungs: Clear to auscultation bilaterally.  Heart: Regular rate and rhythm, no murmurs rubs or gallops.  Abdomen: Bowel sounds are normal, nontender, nondistended, no hepatosplenomegaly or masses, no abdominal bruits or    hernia , no rebound or guarding.   Rectal: done in ED, HEME POSITIVE Extremities: No lower extremity edema, clubbing, deformity.  Neuro: Alert and oriented x 4 , grossly normal neurologically.  Skin: Warm and dry, no rash or jaundice.   Psych: Alert and cooperative, normal mood and affect.        Intake/Output from previous day:   Intake/Output this shift:    Lab Results: CBC  Recent Labs  01/14/13 2335 01/15/13 0624  WBC 6.2 3.8*  HGB 9.5* 7.9*  HCT 26.9* 23.4*  MCV 92.1 92.5  PLT 174 138*   Hgb 12.5 on 12/23/12 Chemo started 12/18/12  BMET  Recent Labs  01/14/13 2335  01/15/13 0624  NA 133* 137  K 3.7 3.8  CL 97 107  CO2 25 22  GLUCOSE 325* 90  BUN 62* 45*  CREATININE 1.07 0.77  CALCIUM 8.2* 7.5*   LFT  Recent Labs  01/14/13 2335 01/15/13 0624  BILITOT 0.5 0.3  ALKPHOS 67 60  AST 26 20  ALT 45* 38*  PROT 5.2* 4.5*  ALBUMIN 2.7* 2.2*   PT/INR  Recent Labs  01/14/13 2335  LABPROT 18.9*  INR 1.64*      Imaging Studies:   Ct Head Wo Contrast  01/15/2013  *RADIOLOGY REPORT*  Clinical Data: Light seen via.  Confusion.  The patient is received radiation therapy for metastatic lung cancer.  CT HEAD WITHOUT CONTRAST  Technique:  Contiguous axial images were obtained from the base of the skull through the vertex without contrast.  Comparison: CT head without contrast 11/16/2012  Findings: The right cerebellar mass is slightly decreased in size, now measuring 3.7 x 2.6 cm. Heterogeneity of the lesion suggests some internal hemorrhage, mostly at the periphery.  Peripheral hyperdensity was previously noted.  There is partial effacement of the fourth ventricle.  No additional  mass lesions are present. Atrophy and white matter disease is stable. The ventricles are of normal size.  No significant extra-axial fluid collection is present.  The paranasal sinuses and mastoid air cells are clear.  The osseous skull is intact.  IMPRESSION:  1.  Decrease in size of a right cerebellar lesion. 2.  Heterogeneity of the lesions suggests some component of hemorrhage, similar to the prior study.   Original Report Authenticated By: Marin Roberts, M.D.    Dg Chest Portable 1 View  01/14/2013  *RADIOLOGY REPORT*  Clinical Data: Altered mental status.  Hypoglycemia and hypotension.  PORTABLE CHEST - 1 VIEW  Comparison: Two-view chest 12/18/2012.  Findings: The heart size is normal. A right middle lobe nodule is stable.  The lungs are otherwise clear.  Pacing wires are stable. The visualized soft tissues and bony thorax are unremarkable.  IMPRESSION:  1.  No acute  cardiopulmonary disease. 2.  Right middle lobe pulmonary nodule.   Original Report Authenticated By: Marin Roberts, M.D.   Pierre.Alas week]   Impression: 62 y/o female with 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 Previously treated with irradiation of the lung and most recently brain. Started chemo 12/18/12. Decadron started in 11/2012 when brain mets discovered. 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 Admitted with hypovolemic shock/bradycardia and ?beta blocker overdose. She is also on coumadin for Afib and prior PE.   She has had decline in H/H with hydration this admission. Prior to initiation of chemo on 12/18/12, her H/H were normal. On admission her Hgb was 9.5. Hgb today 7.9. Her BUN was 29 one week ago, 62 yesterday, 45 today. She is heme positive without overt GI bleeding or symptoms. Last TCS reportedly in 2010, diverticulosis (Dr. Karilyn Cota). Some of anemia related to dilution and effects of her chemo.   INR yesterday 1.64.  Plan: 1. EGD today. She will be late this afternoon since she had breakfast. NPO for now. 2. Continue IV PPI.  I would like to thank Dr. Karilyn Cota for allowing [place] to take part in the care of this nice patient.    LOS: 1 day   Tana Coast  01/15/2013, 11:06 AM  Patient seen and examined. Agree with plan as outlined above. INR 1.67 yesterday. Coumadin held. Further recommendations to follow pending review of pathology report

## 2013-01-15 NOTE — Progress Notes (Signed)
Oncology consulted per order. Will be in tomorrow to assess pt.

## 2013-01-15 NOTE — ED Notes (Signed)
Dr. Lynelle Doctor notified of patient's hypotensive blood pressures including pressure of 77/46.

## 2013-01-15 NOTE — Progress Notes (Signed)
Consents for EGD signed by pts brother [name] and placed on chart.

## 2013-01-15 NOTE — Progress Notes (Signed)
UR Chart Review Completed  

## 2013-01-15 NOTE — Plan of Care (Signed)
Problem: Consults Goal: General Medical Patient Education See Patient Education Module for specific education. Outcome: Progressing Patient admitted for hypotension and hypoglycemia, patients memory is impaired, continues to resort to ask my brothers Goal: Skin Care Protocol Initiated - if Braden Score 18 or less If consults are not indicated, leave blank or document N/A Outcome: Progressing Abdominal bruising noted on admission,  Problem: Phase I Progression Outcomes Goal: Pain controlled with appropriate interventions Outcome: Completed/Met Date Met:  01/15/13 No complaints of pain Goal: OOB as tolerated unless otherwise ordered Outcome: Not Progressing Patient will remain on bedrest at present for hypotension Goal: Initial discharge plan identified Outcome: Progressing At present patient lives home alone, but admits that it has been very hard doing for herself Goal: Hemodynamically stable Outcome: Not Progressing Patient remains hypotensive

## 2013-01-15 NOTE — Progress Notes (Signed)
INITIAL NUTRITION ASSESSMENT  DOCUMENTATION CODES Per approved criteria  -Obesity Class I   INTERVENTION: Resource Breeze po BID, each supplement provides 250 kcal and 9 grams of protein.  NUTRITION DIAGNOSIS: Increased nutrition needs related to lung cancer as evidenced by current guidelines for nutrient requirements.   Goal: Pt to meet >/= 90% of their estimated nutrition needs  Monitor:  Diet advancement, Po intake, labs and wt trends  Reason for Assessment: Malnurtriton Screen  62 y.o. female  Admitting Dx: Hypovolemic shock  ASSESSMENT: Pt has hx of lung cancer and is receiving chemotherapy tx.  Family reports increased confusion/altered mental status PTA.  She is s/p EGD today with gastric biopsy. Gastric and duodenal erosions identified.  Pt diet advanced to clear liquids and reports good appetite today.   She has experienced an unintentional 9#, 5% wt decrease since mid-February. Volume depletion likely a contributing factor. Will continue to follow diet advancement and evaluate adequacy of po intake.   She does not meet criteria for malnutrition at this time.   Height: Ht Readings from Last 1 Encounters:  01/15/13 5\' 2"  (1.575 m)    Weight: Wt Readings from Last 1 Encounters:  01/15/13 166 lb 10.7 oz (75.6 kg)    Ideal Body Weight: 110# (50 kg)  % Ideal Body Weight: 152%  Wt Readings from Last 10 Encounters:  01/15/13 166 lb 10.7 oz (75.6 kg)  01/15/13 166 lb 10.7 oz (75.6 kg)  01/08/13 158 lb 8 oz (71.895 kg)  12/21/12 170 lb (77.111 kg)  12/11/12 173 lb 11.2 oz (78.79 kg)  12/04/12 166 lb 8 oz (75.524 kg)  12/04/12 166 lb 8 oz (75.524 kg)  11/28/12 171 lb 4.8 oz (77.701 kg)  11/16/12 168 lb 6.9 oz (76.4 kg)  11/21/12 175 lb 12.8 oz (79.742 kg)    Usual Body Weight: 176#   % Usual Body Weight: 95%  BMI:  Body mass index is 30.48 kg/(m^2).Obesity Class I  Estimated Nutritional Needs: Kcal: 1500-1750 Protein: 70-80 gr Fluid: > 1800  ml/day  Skin: No issues noted  Diet Order: NPO  EDUCATION NEEDS: -Education not appropriate at this time  No intake or output data in the 24 hours ending 01/15/13 1022  Last BM: 01/14/13  Labs:   Recent Labs Lab 01/08/13 1318 01/14/13 2335 01/15/13 0624  NA 133* 133* 137  K 4.4 3.7 3.8  CL 100 97 107  CO2 23 25 22   BUN 29.8* 62* 45*  CREATININE 0.9 1.07 0.77  CALCIUM 8.6 8.2* 7.5*  MG  --   --  1.7  GLUCOSE 144* 325* 90    CBG (last 3)   Recent Labs  01/15/13 0413 01/15/13 0547 01/15/13 0801  GLUCAP 147* 101* 116*    Scheduled Meds: . atorvastatin  80 mg Oral q1800  . dexamethasone  2 mg Oral Daily  . flecainide  100 mg Oral BID  . folic acid  1 mg Oral Daily  . insulin aspart  0-5 Units Subcutaneous QHS  . insulin aspart  0-9 Units Subcutaneous TID WC  . pantoprazole (PROTONIX) IV  40 mg Intravenous Q24H  . potassium chloride SA  20 mEq Oral BID  . sodium chloride  500 mL Intravenous Once  . sodium chloride  3 mL Intravenous Q12H    Continuous Infusions: . 0.9 % NaCl with KCl 20 mEq / L 100 mL/hr at 01/15/13 1610    Past Medical History  Diagnosis Date  . Atrial fibrillation   . Coronary atherosclerosis  of native coronary artery     Nonobstructive, LVEF 60%  . COPD (chronic obstructive pulmonary disease)   . Mixed hyperlipidemia   . Essential hypertension, benign   . Tachycardia-bradycardia syndrome   . Embolism - blood clot   . SOB (shortness of breath) on exertion   . Allergy     Latex  . Asthma   . Anxiety   . Myocardial infarction     12 years ago ?  [name] History of radiation therapy     59 gy fraction 5 eot 07/09/12 right lung  . Lung cancer 04/29/12 dx    RLL needle bx=invasive well diff adenocarcinoma  nscca  . Adrenal insufficiency     iatrogenic from dexamethasone    Past Surgical History  Procedure Laterality Date  . Appendectomy    . Insert / replace / remove pacemaker  3/11     St. Jude - Dr. [name]  . Video bronchoscopy   02/22/2012    Procedure: VIDEO BRONCHOSCOPY WITH FLUORO;  Surgeon: Nyoka Cowden, MD;  Location: [facility];  Service: Cardiopulmonary;  Laterality: Bilateral;  . Lung biopsy  04/29/12    RLL invasive well diff adenocarcinoma  . Lung biopsy  02/22/12    RML=benign lung parenchyma,no tumor sen  . Tonsillectomy and adenoidectomy      age 53  . Total abdominal hysterectomy  1975    1/2 right ovary intact, left salpingo-oppherctomy  . Mr brain w wo contrast  11/17/2012         Royann Shivers MS,RD,LDN,CSG Office: [phone] Pager: [phone]

## 2013-01-15 NOTE — ED Notes (Addendum)
Patient received 1 liter NS PTA.

## 2013-01-15 NOTE — ED Notes (Signed)
Dr. Orvan Falconer stated for rest of NS fluid bag to run wide open.

## 2013-01-15 NOTE — Op Note (Addendum)
[facility] [address] [place], [address]   ENDOSCOPY PROCEDURE REPORT  PATIENT: [name], [name]  MR#: [id number] BIRTHDATE: [birth year] , 62  yrs. old GENDER: Female ENDOSCOPIST: R.  Roetta Sessions, MD FACP FACG REFERRED BY:  Lilly Cove, M.D. PROCEDURE DATE:  01/15/2013 PROCEDURE:    EGD with gastric biopsy INDICATIONS:  Hemoccult-positive stool; acute anemia. Increased BUN/recent anticoagulation high-dose corticosteroid therapy.  INFORMED CONSENT:   The risks, benefits, limitations, alternatives and imponderables have been discussed.  The potential for biopsy, esophogeal dilation, etc. have also been reviewed.  Questions have been answered.  All parties agreeable.  Please see the history and physical in the medical record for more information.  MEDICATIONS:   Versed 2 mg IV and Demerol 25 mg IV and a single dose. Cetacaine spray. Zofran 4 mg I  DESCRIPTION OF PROCEDURE:   The EG-[id number] ([id number])  endoscope was introduced through the mouth and advanced to the second portion of the duodenum without difficulty or limitations.  The mucosal surfaces were surveyed very carefully during advancement of the scope and upon withdrawal.  Retroflexion view of the proximal stomach and esophagogastric junction was performed.      FINDINGS: Normal esophagus. Stomach empty. Linear antral erosions. (1) 8mm elliptical prepyloric antral ulcer. No obvious infiltrating process. Patent pylorus. Examination of bulb demonstrated erosions only; the second portion appeared normal  THERAPEUTIC / DIAGNOSTIC MANEUVERS PERFORMED:  Biopsies of the gastric ulcer and abnormal-appearing antrum taken for histologic study   COMPLICATIONS:  None  IMPRESSION:   Gastric ulcer status post biopsy. Gastric and duodenal erosions. Today's findings could easily explain the Hemoccult positive stool and an element of bleeding to the extent she could have dropped hemoglobin in the setting of  anticoagulation. However, anemia secondary to recent chemotherapy and anemia of chronic disease and hemodilution may also be contributing to acute on chronic anemia. She has been hypotensive on admission and I note that her Decadron therapy has been tapered back.  RECOMMENDATIONS:   Clear liquid diet.  Increase Protonix to 40 mg IV every 12 hours. Would consider an element of acute adrenal insufficiency in the setting of acute illness and steroid taper as a contributing factor to hypotension. Pt may benefit from increasing steroid therapy with more of mineraldocorticoid-active agent such as hydrocortisone-Discussed with Dr. Karilyn Cota; Dr. Darrick Penna will begin seeing April 11 in my absence.    _______________________________ R. Roetta Sessions, MD FACP [facility] eSigned:  R. Roetta Sessions, MD FACP [facility] 01/15/2013 3:04 PM     CC:  PATIENT NAME:  [name], [name] MR#: [id number]

## 2013-01-16 DIAGNOSIS — D649 Anemia, unspecified: Principal | ICD-10-CM

## 2013-01-16 DIAGNOSIS — C3490 Malignant neoplasm of unspecified part of unspecified bronchus or lung: Secondary | ICD-10-CM

## 2013-01-16 DIAGNOSIS — K259 Gastric ulcer, unspecified as acute or chronic, without hemorrhage or perforation: Secondary | ICD-10-CM

## 2013-01-16 DIAGNOSIS — C3430 Malignant neoplasm of lower lobe, unspecified bronchus or lung: Secondary | ICD-10-CM

## 2013-01-16 DIAGNOSIS — C342 Malignant neoplasm of middle lobe, bronchus or lung: Secondary | ICD-10-CM

## 2013-01-16 DIAGNOSIS — Z7901 Long term (current) use of anticoagulants: Secondary | ICD-10-CM

## 2013-01-16 LAB — COMPREHENSIVE METABOLIC PANEL
ALT: 36 U/L — ABNORMAL HIGH (ref 0–35)
Albumin: 2.2 g/dL — ABNORMAL LOW (ref 3.5–5.2)
Alkaline Phosphatase: 62 U/L (ref 39–117)
Calcium: 8 mg/dL — ABNORMAL LOW (ref 8.4–10.5)
Potassium: 4.8 mEq/L (ref 3.5–5.1)
Sodium: 141 mEq/L (ref 135–145)
Total Protein: 4.6 g/dL — ABNORMAL LOW (ref 6.0–8.3)

## 2013-01-16 LAB — GLUCOSE, CAPILLARY
Glucose-Capillary: 122 mg/dL — ABNORMAL HIGH (ref 70–99)
Glucose-Capillary: 164 mg/dL — ABNORMAL HIGH (ref 70–99)
Glucose-Capillary: 243 mg/dL — ABNORMAL HIGH (ref 70–99)

## 2013-01-16 LAB — PREPARE RBC (CROSSMATCH)

## 2013-01-16 LAB — CBC
MCH: 31.3 pg (ref 26.0–34.0)
MCHC: 33.2 g/dL (ref 30.0–36.0)
RDW: 17.6 % — ABNORMAL HIGH (ref 11.5–15.5)

## 2013-01-16 MED ORDER — DIPHENHYDRAMINE HCL 25 MG PO CAPS
25.0000 mg | ORAL_CAPSULE | Freq: Once | ORAL | Status: AC
  Administered 2013-01-16: 25 mg via ORAL
  Filled 2013-01-16: qty 1

## 2013-01-16 MED ORDER — PANTOPRAZOLE SODIUM 40 MG PO TBEC
40.0000 mg | DELAYED_RELEASE_TABLET | Freq: Two times a day (BID) | ORAL | Status: DC
  Administered 2013-01-16 – 2013-01-20 (×9): 40 mg via ORAL
  Filled 2013-01-16 (×9): qty 1

## 2013-01-16 MED ORDER — ACETAMINOPHEN 325 MG PO TABS
650.0000 mg | ORAL_TABLET | Freq: Once | ORAL | Status: AC
  Administered 2013-01-16: 650 mg via ORAL
  Filled 2013-01-16: qty 2

## 2013-01-16 NOTE — Progress Notes (Signed)
Chart reviewed.  Subjective: No bleeding. Feels a little dizzy with standing. No abdominal pain. Reports that social worker is working on placement prior to admission. She is agreeable. No shortness of breath or chest pain. Does not recall having taken extra doses of medication, but does admit some time she has a difficult time with her medications.  Objective: Vital signs in last 24 hours: Filed Vitals:   01/16/13 0700 01/16/13 0755 01/16/13 0800 01/16/13 0904  BP: 105/51  85/44   Pulse: 65  65 77  Temp:  98.1 F (36.7 C)    TempSrc:  Oral    Resp: 12  16   Height:      Weight:      SpO2: 100%  100% 100%   Weight change: 1.2 kg (2 lb 10.3 oz)  Intake/Output Summary (Last 24 hours) at 01/16/13 0921 Last data filed at 01/16/13 0800  Gross per 24 hour  Intake   3235 ml  Output   2000 ml  Net   1235 ml   General: Up to the commode. Alert. Oriented but forgetful. HEENT: Alopecia. Scleral edema present Lungs clear to auscultation bilaterally without wheeze rhonchi or rales Cardiovascular regular rate rhythm without murmurs gallops rubs Abdomen obese soft nontender Extremities multiple varicosities. No edema. Neurologic: No cranial nerve deficits. No focal weakness.  Lab Results: Basic Metabolic Panel:  Recent Labs Lab 01/15/13 0624 01/16/13 0434  NA 137 141  K 3.8 4.8  CL 107 113*  CO2 22 21  GLUCOSE 90 106*  BUN 45* 17  CREATININE 0.77 0.59  CALCIUM 7.5* 8.0*  MG 1.7  --    Liver Function Tests:  Recent Labs Lab 01/15/13 0624 01/16/13 0434  AST 20 20  ALT 38* 36*  ALKPHOS 60 62  BILITOT 0.3 0.3  PROT 4.5* 4.6*  ALBUMIN 2.2* 2.2*   No results found for this basename: LIPASE, AMYLASE,  in the last 168 hours No results found for this basename: AMMONIA,  in the last 168 hours CBC:  Recent Labs Lab 01/14/13 2335 01/15/13 0624 01/16/13 0434  WBC 6.2 3.8* 5.1  NEUTROABS 5.4  --   --   HGB 9.5* 7.9* 7.5*  HCT 26.9* 23.4* 22.6*  MCV 92.1 92.5 94.2   PLT 174 138* 129*   Cardiac Enzymes:  Recent Labs Lab 01/14/13 2335 01/15/13 0624 01/15/13 1232  TROPONINI <0.30 <0.30 <0.30   BNP: No results found for this basename: PROBNP,  in the last 168 hours D-Dimer: No results found for this basename: DDIMER,  in the last 168 hours CBG:  Recent Labs Lab 01/15/13 1358 01/15/13 1612 01/15/13 2023 01/16/13 0029 01/16/13 0340 01/16/13 0717  GLUCAP 90 70 132* 164* 122* 90   Hemoglobin A1C: No results found for this basename: HGBA1C,  in the last 168 hours Fasting Lipid Panel: No results found for this basename: CHOL, HDL, LDLCALC, TRIG, CHOLHDL, LDLDIRECT,  in the last 168 hours Thyroid Function Tests:  Recent Labs Lab 01/15/13 0624  TSH 0.653   Coagulation:  Recent Labs Lab 01/12/13 01/14/13 2335  LABPROT  --  18.9*  INR 1.0 1.64*   Anemia Panel: No results found for this basename: VITAMINB12, FOLATE, FERRITIN, TIBC, IRON, RETICCTPCT,  in the last 168 hours Urine Drug Screen: Drugs of Abuse  No results found for this basename: labopia, cocainscrnur, labbenz, amphetmu, thcu, labbarb    Alcohol Level: No results found for this basename: ETH,  in the last 168 hours Urinalysis:  Recent Labs Lab  01/15/13 0109  COLORURINE YELLOW  LABSPEC >1.030*  PHURINE 5.5  GLUCOSEU 100*  HGBUR NEGATIVE  BILIRUBINUR NEGATIVE  KETONESUR NEGATIVE  PROTEINUR NEGATIVE  UROBILINOGEN 0.2  NITRITE NEGATIVE  LEUKOCYTESUR NEGATIVE   Micro Results: Recent Results (from the past 240 hour(s))  TECHNOLOGIST REVIEW     Status: None   Collection Time    01/08/13  1:18 PM      Result Value Range Status   Technologist Review Metas and Myelocytes present   Final  MRSA PCR SCREENING     Status: None   Collection Time    01/15/13  4:53 AM      Result Value Range Status   MRSA by PCR NEGATIVE  NEGATIVE Final   Comment:            The GeneXpert MRSA Assay (FDA     approved for NASAL specimens     only), is one component of a      comprehensive MRSA colonization     surveillance program. It is not     intended to diagnose MRSA     infection nor to guide or     monitor treatment for     MRSA infections.   Studies/Results: Ct Head Wo Contrast  01/15/2013  *RADIOLOGY REPORT*  Clinical Data: Light seen via.  Confusion.  The patient is received radiation therapy for metastatic lung cancer.  CT HEAD WITHOUT CONTRAST  Technique:  Contiguous axial images were obtained from the base of the skull through the vertex without contrast.  Comparison: CT head without contrast 11/16/2012  Findings: The right cerebellar mass is slightly decreased in size, now measuring 3.7 x 2.6 cm. Heterogeneity of the lesion suggests some internal hemorrhage, mostly at the periphery.  Peripheral hyperdensity was previously noted.  There is partial effacement of the fourth ventricle.  No additional mass lesions are present. Atrophy and white matter disease is stable. The ventricles are of normal size.  No significant extra-axial fluid collection is present.  The paranasal sinuses and mastoid air cells are clear.  The osseous skull is intact.  IMPRESSION:  1.  Decrease in size of a right cerebellar lesion. 2.  Heterogeneity of the lesions suggests some component of hemorrhage, similar to the prior study.   Original Report Authenticated By: Marin Roberts, M.D.    Dg Chest Portable 1 View  01/14/2013  *RADIOLOGY REPORT*  Clinical Data: Altered mental status.  Hypoglycemia and hypotension.  PORTABLE CHEST - 1 VIEW  Comparison: Two-view chest 12/18/2012.  Findings: The heart size is normal. A right middle lobe nodule is stable.  The lungs are otherwise clear.  Pacing wires are stable. The visualized soft tissues and bony thorax are unremarkable.  IMPRESSION:  1.  No acute cardiopulmonary disease. 2.  Right middle lobe pulmonary nodule.   Original Report Authenticated By: Marin Roberts, M.D.    Scheduled Meds: . atorvastatin  80 mg Oral q1800  . feeding  supplement  1 Container Oral TID BM  . flecainide  100 mg Oral BID  . folic acid  1 mg Oral Daily  . hydrocortisone sod succinate (SOLU-CORTEF) injection  100 mg Intravenous Q12H  . insulin aspart  0-5 Units Subcutaneous QHS  . insulin aspart  0-9 Units Subcutaneous TID WC  . pantoprazole (PROTONIX) IV  40 mg Intravenous Q12H  . sodium chloride  3 mL Intravenous Q12H   Continuous Infusions: . 0.9 % NaCl with KCl 20 mEq / L 100 mL/hr at 01/16/13 1610  PRN Meds:.albuterol, benzonatate, HYDROmorphone (DILAUDID) injection, ipratropium, ondansetron (ZOFRAN) IV, traZODone Assessment/Plan:    Anemia, multifactorial, secondary to gastric ulcer and erosions, chemotherapy, hemodilution. Patient's blood pressure is still borderline low. Will transfuse another unit of blood. Continue step down monitoring.    Hypovolemic shock/adrenal crisis: Will decrease IV to [facility]. Continue stress dose steroids for now, as blood pressure borderline low.    Gastric ulcer: Advance diet as tolerated. No gross GI bleeding at this point. Change Protonix to by mouth    PAF (paroxysmal atrial fibrillation): Currently in sinus on flecainide. Coumadin risk may at this point outweigh the benefit.  Held for now. Followed by [facility] cardiology. Will discuss with cardiology long-term need for Coumadin.    Encounter for long-term (current) use of anticoagulants    Lung cancer    Steroid-induced hyperglycemia with hypoglycemia on admission. Currently euglycemic.    Secondary malignant neoplasm of brain    HYPERLIPIDEMIA    OVERWEIGHT/OBESITY    COPD (chronic obstructive pulmonary disease)    Cardiac pacemaker in situ    Hypoglycemia    Delirium secondary to multiple medical problems improved. Unclear what her baseline is.    CORONARY ATHEROSCLEROSIS NATIVE CORONARY ARTERY    Essential hypertension, benign  Disposition: Reportedly, patient's social worker was looking into assisted living. Will consult physical  therapy and social work. Would likely benefit from placement, as she lives alone and is having difficulty managing.   LOS: 2 days   Margarie Mcguirt L 01/16/2013, 9:21 AM

## 2013-01-16 NOTE — Progress Notes (Signed)
Very pleasant 62 year old Caucasian lady with metastatic non-small cell lung cancer consistent with an adenocarcinoma is ALK negative an EGFR negative. She has metastatic disease to the brain status post radiation therapy. She recently underwent her first cycle of chemotherapy. This was with carboplatinum and pemetrexed. She ended appear hospital unable to take care of herself. Her sugars are sometimes greater than 300. Her hemoglobin is less than 8 g most recently. This also mildly low.  He states that she lives by herself. She does have 2 children she states to have little if anything to do with her she states. She is not married presently.  She is alert but I think she is either hard of hearing are slightly unable to understand a question completely. She looks weak and chronically ill. She is bald. Her lungs show diminished breath sounds throughout. I did not hear a rub or rouse. Her heart shows a regular rhythm and rate right around 100. Her abdomen is soft and nontender without hepatosplenomegaly. She did not have leg edema. She does not have arm edema. She did have a Port-A-Cath.  She needs to be supported until she recuperates well enough to go home. I think that if she does not do well with cycle 2 that chemotherapy even needs to be reduced greatly or eliminated altogether with palliation only going forward. She is very very weak at this time. She looks like a very debilitated woman at this juncture. Her CT scan of the head on admission revealed the lesion in the cerebellum. There did not appear to be anything new. The chest x-ray showed a pulmonary nodule but no obvious pneumonia etc.  Presently she needs supportive therapy predominantly.

## 2013-01-16 NOTE — Clinical Social Work Placement (Signed)
    Clinical Social Work Department CLINICAL SOCIAL WORK PLACEMENT NOTE 01/20/2013  Patient:  [name], [name]  Account Number:  [id number] Admit date:  01/14/2013  Clinical Social Worker:  Santa Genera, CLINICAL SOCIAL WORKER  Date/time:  01/16/2013 11:00 AM  Clinical Social Work is seeking post-discharge placement for this patient at the following level of care:   ASSISTED LIVING/REST HOME   (*CSW will update this form in Epic as items are completed)   01/16/2013  Patient/family provided with [facility] Department of Clinical Social Work's list of facilities offering this level of care within the geographic area requested by the patient (or if unable, by the patient's family).  01/16/2013  Patient/family informed of their freedom to choose among providers that offer the needed level of care, that participate in Medicare, Medicaid or managed care program needed by the patient, have an available bed and are willing to accept the patient.  01/16/2013  Patient/family informed of [facility]' ownership interest in [facility], as well as of the fact that they are under no obligation to receive care at this facility.  PASARR submitted to EDS on 01/16/2013 PASARR number received from EDS on 01/16/2013  FL2 transmitted to all facilities in geographic area requested by pt/family on  01/16/2013 FL2 transmitted to all facilities within larger geographic area on   Patient informed that his/her managed care company has contracts with or will negotiate with  certain facilities, including the following:     Patient/family informed of bed offers received:  01/19/2013 Patient chooses bed at OTHER Physician recommends and patient chooses bed at  OTHER  Patient to be transferred to OTHER on  01/20/2013 Patient to be transferred to facility by [facility] Zenaida Niece and staff  The following physician request were entered in Epic:   Additional Comments: Patient placed at [facility], [address], [place], [place] (Darrall Dears, supervisor)  Santa Genera, LCSW Clinical Social Worker [phone])

## 2013-01-16 NOTE — Progress Notes (Signed)
I     [facility] [phone]   [phone]  OFFICE PROGRESS NOTE  Ernestine Conrad, MD [facility] [address] [place] [address]  DIAGNOSIS: Metastatic non-small cell lung cancer initially diagnosed as synchronous primary non-small cell lung cancer, adenocarcinoma with negative EGFR mutation and negative ALK gene translocation involving the right middle lobe and lower lobe diagnosed in July of 2013.   PRIOR THERAPY:  1) Stereotactic radiotherapy to the right lung lesions under the care of Dr. Mitzi Hansen completed on 07/09/2012.  2) whole brain irradiation under the care of Dr. Mitzi Hansen completed on 12/04/2012  CURRENT THERAPY: Systemic chemotherapy with carboplatin for AUC of 5 and Alimta 500 mg/M2 every 3 weeks. Status post 1 cycle   INTERVAL HISTORY: [name] 62 y.o. female returns to the clinic today for followup visit. The patient was hospitalized after her first cycle of chemotherapy due to very high blood sugars in the over 500 range. She was discharged 12/18/2012. She is now on NovoLog 70/30 mix, 28 units subcutaneously twice daily. She reports that her blood sugars are between 90 and 190 daily. She is accompanied today by her brother and sister-in-law. Her brother notes that her memory is not been as good since she has completed whole brain radiation. They're looking into a social work evaluation for possible assisted-living for metastatic. She presents to proceed with cycle #2 of her systemic chemotherapy with carboplatin and Alimta. She is currently taking dexamethasone 2 mg by mouth and is to take his from March 31 through 01/18/2013. The patient is feeling fine today with no specific complaints.  The patient denied having any significant chest pain, shortness breath, cough or hemoptysis. She has no significant weight loss or night sweats.   MEDICAL HISTORY: Past Medical History  Diagnosis Date  . Atrial fibrillation   . Coronary  atherosclerosis of native coronary artery     Nonobstructive, LVEF 60%  . COPD (chronic obstructive pulmonary disease)   . Mixed hyperlipidemia   . Essential hypertension, benign   . Tachycardia-bradycardia syndrome   . Embolism - blood clot     PE  . SOB (shortness of breath) on exertion   . Allergy     Latex  . Asthma   . Anxiety   . Myocardial infarction     12 years ago ?  [name] History of radiation therapy     59 gy fraction 5 eot 07/09/12 right lung  . Lung cancer 04/29/12 dx    RLL needle bx=invasive well diff adenocarcinoma  [facility]  . 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  MEDICATIONS:  No current facility-administered medications for this visit.   No current outpatient prescriptions on file.   Facility-Administered Medications Ordered in Other Visits  Medication Dose Route Frequency Provider Last Rate Last Dose  . 0.9 % NaCl with KCl 20 mEq/ L  infusion   Intravenous Continuous Christiane Ha, MD 10 mL/hr at 01/16/13 1324 10 mL/hr at 01/16/13 1324  . albuterol (PROVENTIL) (5 MG/ML) 0.5% nebulizer solution 2.5 mg  2.5 mg Nebulization Q4H PRN Vania Rea, MD      . atorvastatin (LIPITOR) tablet 80 mg  80 mg Oral q1800 Vania Rea, MD   80 mg at 01/15/13 1727  . benzonatate (TESSALON) capsule 100 mg  100 mg Oral TID PRN Vania Rea, MD      . feeding supplement (RESOURCE BREEZE) liquid  1 Container  1 Container Oral TID BM Francene Boyers, RD   1 Container at 01/16/13 1010  . flecainide (TAMBOCOR) tablet 100 mg  100 mg Oral BID Vania Rea, MD   100 mg at 01/16/13 1010  . folic acid (FOLVITE) tablet 1 mg  1 mg Oral Daily Vania Rea, MD   1 mg at 01/16/13 1010  . hydrocortisone sodium succinate (SOLU-CORTEF) 100 mg/2 mL injection 100 mg  100 mg Intravenous Q12H Nimish C Karilyn Cota, MD   100 mg at 01/16/13 1430  . HYDROmorphone (DILAUDID) injection 0.5 mg  0.5 mg Intravenous Q4H PRN Nimish C  Gosrani, MD      . insulin aspart (novoLOG) injection 0-5 Units  0-5 Units Subcutaneous QHS Vania Rea, MD      . insulin aspart (novoLOG) injection 0-9 Units  0-9 Units Subcutaneous TID WC Vania Rea, MD   3 Units at 01/16/13 1227  . ipratropium (ATROVENT) nebulizer solution 500 mcg  500 mcg Nebulization QID PRN Vania Rea, MD      . ondansetron [facility]) injection 4 mg  4 mg Intravenous Q4H PRN Vania Rea, MD      . pantoprazole (PROTONIX) EC tablet 40 mg  40 mg Oral BID Christiane Ha, MD   40 mg at 01/16/13 1010  . sodium chloride 0.9 % injection 3 mL  3 mL Intravenous Q12H Vania Rea, MD      . traZODone (DESYREL) tablet 50 mg  50 mg Oral QHS PRN Vania Rea, MD        SURGICAL HISTORY:  Past Surgical History  Procedure Laterality Date  . Appendectomy    . Insert / replace / remove pacemaker  3/11     [employer] - Dr. [name]  . Video bronchoscopy  02/22/2012    Procedure: VIDEO BRONCHOSCOPY WITH FLUORO;  Surgeon: Nyoka Cowden, MD;  Location: [facility];  Service: Cardiopulmonary;  Laterality: Bilateral;  . Lung biopsy  04/29/12    RLL invasive well diff adenocarcinoma  . Lung biopsy  02/22/12    RML=benign lung parenchyma,no tumor sen  . Tonsillectomy and adenoidectomy      age 46  . Total abdominal hysterectomy  1975    1/2 right ovary intact, left salpingo-oppherctomy  . Mr brain w wo contrast  11/17/2012         REVIEW OF SYSTEMS:  A comprehensive review of systems was negative except for: Constitutional: positive for fatigue   PHYSICAL EXAMINATION: General appearance: alert, cooperative and no distress Head: Normocephalic, without obvious abnormality, atraumatic Neck: no adenopathy Lymph nodes: Cervical, supraclavicular, and axillary nodes normal. Resp: clear to auscultation bilaterally Cardio: regular rate and rhythm, S1, S2 normal, no murmur, click, rub or gallop GI: soft, non-tender; bowel sounds normal; no masses,  no  organomegaly Extremities: extremities normal, atraumatic, no cyanosis or edema Neurologic: Alert and oriented X 3, normal strength and tone. Normal symmetric reflexes. Normal coordination and gait  ECOG PERFORMANCE STATUS: 1 - Symptomatic but completely ambulatory  Blood pressure 95/51, pulse 72, temperature 97.5 F (36.4 C), temperature source Oral, resp. rate 20, height 5\' 2"  (1.575 m), weight 158 lb 8 oz (71.895 kg).  LABORATORY DATA: Lab Results  Component Value Date   WBC 5.1 01/16/2013   HGB 7.5* 01/16/2013   HCT 22.6* 01/16/2013   MCV 94.2 01/16/2013   PLT 129* 01/16/2013      Chemistry      Component Value Date/Time   NA 141 01/16/2013 0434  NA 133* 01/08/2013 1318   K 4.8 01/16/2013 0434   K 4.4 01/08/2013 1318   CL 113* 01/16/2013 0434   CL 100 01/08/2013 1318   CO2 21 01/16/2013 0434   CO2 23 01/08/2013 1318   BUN 17 01/16/2013 0434   BUN 29.8* 01/08/2013 1318   CREATININE 0.59 01/16/2013 0434   CREATININE 0.9 01/08/2013 1318   CREATININE 0.80 09/24/2011 1058   GLU Above linear limit,repeat venous draw for confirmation 12/18/2012 1643      Component Value Date/Time   CALCIUM 8.0* 01/16/2013 0434   CALCIUM 8.6 01/08/2013 1318   ALKPHOS 62 01/16/2013 0434   ALKPHOS 85 01/08/2013 1318   AST 20 01/16/2013 0434   AST 22 01/08/2013 1318   ALT 36* 01/16/2013 0434   ALT 69* 01/08/2013 1318   BILITOT 0.3 01/16/2013 0434   BILITOT 0.42 01/08/2013 1318       RADIOGRAPHIC STUDIES: Ct Head Wo Contrast  11/16/2012  *RADIOLOGY REPORT*  Clinical Data: Vomiting, dizziness  CT HEAD WITHOUT CONTRAST  Technique:  Contiguous axial images were obtained from the base of the skull through the vertex without contrast.  Comparison: CT brain scan of 05/21/2012  Findings: There is a large rounded lesion within the right cerebellum displacing the midline to the left and slightly impinging upon the fourth ventricle.  This lesion enhances peripherally and measures 3.5 x 4.0 cm most consistent with a large right  cerebellar metastasis.  There is a question of a second lesion high in the right cerebellum or possibly in the right posterior parietal lobe measuring 2.5 x 2.0 cm.  No acute hemorrhage is seen.  The ventricular system is stable in size and configuration and the septum is in a midline position.  MRI is recommended for more sensitive assessment of possible additional metastatic lesions.  Mild small vessel ischemic change is present in the periventricular white matter.  No calvarial abnormality is seen.  IMPRESSION:  1.  Rounded peripherally enhancing lesion in the right cerebellar hemisphere with questionable second lesion possibly the right posterior parietal lobe suspicious for metastatic lesions.  MRI of the brain is recommended. 2.  Mild small vessel ischemic change.  I discussed the findings of this study with Dr. Estell Harpin on 11/16/2012 at 4:50 p.m.   Original Report Authenticated By: Dwyane Dee, M.D.     ASSESSMENT/PLAN: This is a very pleasant 62 years old white female with metastatic non-small cell lung cancer, adenocarcinoma with recent brain metastasis status post whole brain irradiation. She's now status post one cycle of systemic chemotherapy with carboplatin for an AUC of 5 and Alimta 500 mg region squared given every 3 weeks. The patient was discussed with Dr. Arbutus Ped. We'll have her follow a dexamethasone taper as follows: 2 mg by mouth daily for the next 7 days, 2 mg every other day for the next 7 days then discontinue. She'll proceed with cycle #2 of her systemic chemotherapy with carboplatin and Alimta as scheduled today. She will continue with weekly labs consisting of a CBC differential and C. met. She'll return in 3 weeks prior to cycle #3 with repeat CBC differential and C. met. The family is encouraged to see with the social work evaluation regarding possible assisted-living for [name]. For her diabetes mellitus she is to continue with her insulin and fingerstick glucose checks as outlined  by her primary care physician on followup with her primary care physician as scheduled to  Conni Slipper, PA-C    All questions were  answered. The patient knows to call the clinic with any problems, questions or concerns. We can certainly see the patient much sooner if necessary.  I spent 20 minutes counseling the patient face to face. The total time spent in the appointment was 30 minutes.

## 2013-01-16 NOTE — Progress Notes (Signed)
One unit PRBC started as ordered. Also foley catheter d/c'd as ordered.

## 2013-01-16 NOTE — Evaluation (Signed)
Physical Therapy Evaluation Patient Details Name: [name] MRN: [id number] DOB: 11-Sep-1951 Today's Date: 01/16/2013 Time: 9147-8295 PT Time Calculation (min): 25 min  PT Assessment / Plan / Recommendation Clinical Impression  Pt was seen for evaluation.   She has been living alone, having increasingly more difficulty with personal care, climbing her steps to apartment and executive functioning skills.  One would assume that this is all due to her recent illness and tx with chemo and radiation tx.  Her strength is WNL as is transfer ability and gait with a walker.  We were quite limited by ICU lines in terms of gait, but I did not see any gait impairment as long as she has an assistive device.  I completely agree that she would be most appropriate for ACLF.  I do not see any urgent need to add any PT intervention onto her plate.    PT Assessment  Patent does not need any further PT services    Follow Up Recommendations  No PT follow up    Does the patient have the potential to tolerate intense rehabilitation      Barriers to Discharge        Equipment Recommendations  None recommended by PT    Recommendations for Other Services     Frequency      Precautions / Restrictions Precautions Precautions: None Restrictions Weight Bearing Restrictions: No   Pertinent Vitals/Pain       Mobility  Bed Mobility Bed Mobility: Supine to Sit;Sit to Supine Supine to Sit: 7: Independent;HOB flat Sit to Supine: 7: Independent;HOB flat Transfers Transfers: Sit to Stand;Stand to Sit Sit to Stand: 6: Modified independent (Device/Increase time);From bed;Without upper extremity assist Stand to Sit: 6: Modified independent (Device/Increase time);To bed;With upper extremity assist Ambulation/Gait Ambulation/Gait Assistance: 6: Modified independent (Device/Increase time) Ambulation Distance (Feet):  (limited by ICU lines) Assistive device: Rolling walker Ambulation/Gait Assistance  Details: Pt walks with a cane or walker at home, usually with the cane.   I didn't have a cane available, but I think she could have easily managed with a cane as well. Gait Pattern: Within Functional Limits Gait velocity: WNL General Gait Details: no apparent instability Stairs: No Wheelchair Mobility Wheelchair Mobility: No    Exercises     PT Diagnosis:    PT Problem List:   PT Treatment Interventions:     PT Goals    Visit Information  Last PT Received On: 01/16/13    Subjective Data  Subjective: I'm kind of "off" today Patient Stated Goal: none stated   Prior Functioning  Home Living Lives With: Alone Available Help at Discharge: Family;Available PRN/intermittently Type of Home: Apartment Home Access: Stairs to enter [employer] of Steps: 2nd floor apartment Entrance Stairs-Rails: Right Bathroom Shower/Tub: Health visitor: Standard Home Adaptive Equipment: Engineer, drilling - rolling;Straight cane Prior Function Level of Independence: Independent with assistive device(s) Able to Take Stairs?: Yes Driving: No Vocation: On disability Communication Communication: No difficulties    Cognition  Cognition Overall Cognitive Status: Impaired Area of Impairment: Executive functioning Arousal/Alertness: Awake/alert Orientation Level: Appears intact for tasks assessed Behavior During Session: [facility] for tasks performed    Extremity/Trunk Assessment Right Lower Extremity Assessment RLE ROM/Strength/Tone: Within functional levels RLE Sensation: WFL - Light Touch RLE Coordination: WFL - gross motor Left Lower Extremity Assessment LLE ROM/Strength/Tone: Within functional levels LLE Sensation: WFL - Light Touch LLE Coordination: WFL - gross motor   Balance Balance Balance Assessed: No  End of  Session PT - End of Session Equipment Utilized During Treatment: Gait belt Activity Tolerance: Patient limited by fatigue Patient left: in  bed;with call bell/phone within reach Nurse Communication: Mobility status  GP     Konrad Penta 01/16/2013, 1:22 PM

## 2013-01-16 NOTE — Progress Notes (Signed)
REVIEWED.  

## 2013-01-16 NOTE — Progress Notes (Signed)
Subjective:  No complaints except for dizziness with standing. No abdominal pain. Tolerating clear liquids. No BM today.  Objective: Vital signs in last 24 hours: Temp:  [97.6 F (36.4 C)-98.5 F (36.9 C)] 98.1 F (36.7 C) (04/11 0755) Pulse Rate:  [32-128] 68 (04/11 0600) Resp:  [12-28] 13 (04/11 0600) BP: (61-103)/(24-73) 102/50 mmHg (04/11 0600) SpO2:  [88 %-100 %] 100 % (04/11 0600) Weight:  [169 lb 5 oz (76.8 kg)] 169 lb 5 oz (76.8 kg) (04/11 0500) Last BM Date: 01/15/13 General:   Alert,  Well-developed, rounded face, pleasant and cooperative in NAD Head:  Normocephalic and atraumatic. Eyes:  Sclera clear, no icterus.  Chest: CTA bilaterally without rales, rhonchi, crackles but breath sounds distant.    Heart:  Regular rate and rhythm; no murmurs, clicks, rubs,  or gallops. Abdomen:  Soft, nontender and nondistended.  Normal bowel sounds, without guarding, and without rebound.   Extremities:  Without clubbing, deformity or edema. Neurologic:  Alert and  oriented x4;  grossly normal neurologically. Skin:  Intact without significant lesions or rashes. Psych:  Alert and cooperative. Normal mood and affect.  Intake/Output from previous day: 04/10 0701 - 04/11 0700 In: 2995 [P.O.:480; I.V.:2015; IV Piggyback:500] Out: 2701 [Urine:2700; Stool:1] Intake/Output this shift:    Lab Results: CBC  Recent Labs  01/14/13 2335 01/15/13 0624 01/16/13 0434  WBC 6.2 3.8* 5.1  HGB 9.5* 7.9* 7.5*  HCT 26.9* 23.4* 22.6*  MCV 92.1 92.5 94.2  PLT 174 138* 129*   BMET  Recent Labs  01/14/13 2335 01/15/13 0624 01/16/13 0434  NA 133* 137 141  K 3.7 3.8 4.8  CL 97 107 113*  CO2 25 22 21   GLUCOSE 325* 90 106*  BUN 62* 45* 17  CREATININE 1.07 0.77 0.59  CALCIUM 8.2* 7.5* 8.0*   LFTs  Recent Labs  01/14/13 2335 01/15/13 0624 01/16/13 0434  BILITOT 0.5 0.3 0.3  ALKPHOS 67 60 62  AST 26 20 20   ALT 45* 38* 36*  PROT 5.2* 4.5* 4.6*  ALBUMIN 2.7* 2.2* 2.2*   No results  found for this basename: LIPASE,  in the last 72 hours PT/INR  Recent Labs  01/14/13 2335  LABPROT 18.9*  INR 1.64*          Assessment: 62 y/o female with 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 Previously treated with irradiation of the lung and most recently brain. Started chemo 12/18/12. Decadron started in 11/2012 when brain mets discovered. 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 Admitted with hypotension/bradycardia. She is also on coumadin for Afib and prior PE. She has had decline in H/H with hydration this admission. Prior to initiation of chemo on 12/18/12, her H/H were normal. On admission her Hgb was 9.5. Hgb yesterday 7.9. Today down to 7.5.  She is heme positive without overt GI bleeding or symptoms. Last TCS reportedly in 2010, diverticulosis (Dr. Karilyn Cota). Some of anemia related to dilution and effects of her chemo.   EGD yesterday showed gastric ulcer, gastric/duodenal erosions s/p biopsy.   ?Acute adrenal insufficiency in the setting of acute illness and steroid taper as cause of hypotension. Patient has been started on hydrocortisone IV.   Plan: 1. Will give one unit of blood today. Discussed with Dr. Lendell Caprice. 2. Continue IV Protonix. 3. Advance diet as patient ready. Currently she prefers clear liquids.    LOS: 2 days   Tana Coast  01/16/2013, 8:06 AM

## 2013-01-16 NOTE — Clinical Social Work Note (Deleted)
[facility] and DSS guardian updated on patient condition.  2 MD certification paperwork completed, will be faxed to DSS guardian for action and decision when notarized this afternoon.    Santa Genera, LCSW Clinical Social Worker [phone])

## 2013-01-17 DIAGNOSIS — R7309 Other abnormal glucose: Secondary | ICD-10-CM

## 2013-01-17 DIAGNOSIS — D649 Anemia, unspecified: Secondary | ICD-10-CM

## 2013-01-17 DIAGNOSIS — I959 Hypotension, unspecified: Secondary | ICD-10-CM

## 2013-01-17 LAB — BASIC METABOLIC PANEL
BUN: 15 mg/dL (ref 6–23)
Chloride: 108 mEq/L (ref 96–112)
Creatinine, Ser: 0.68 mg/dL (ref 0.50–1.10)
Glucose, Bld: 120 mg/dL — ABNORMAL HIGH (ref 70–99)
Potassium: 3.2 mEq/L — ABNORMAL LOW (ref 3.5–5.1)

## 2013-01-17 LAB — CBC
HCT: 24.9 % — ABNORMAL LOW (ref 36.0–46.0)
Hemoglobin: 8.6 g/dL — ABNORMAL LOW (ref 12.0–15.0)
MCHC: 34.5 g/dL (ref 30.0–36.0)
MCV: 92.6 fL (ref 78.0–100.0)
WBC: 5.1 10*3/uL (ref 4.0–10.5)

## 2013-01-17 LAB — GLUCOSE, CAPILLARY
Glucose-Capillary: 132 mg/dL — ABNORMAL HIGH (ref 70–99)
Glucose-Capillary: 191 mg/dL — ABNORMAL HIGH (ref 70–99)

## 2013-01-17 MED ORDER — POTASSIUM CHLORIDE 10 MEQ/100ML IV SOLN
10.0000 meq | INTRAVENOUS | Status: AC
  Administered 2013-01-17 (×2): 10 meq via INTRAVENOUS
  Filled 2013-01-17 (×2): qty 100

## 2013-01-17 MED ORDER — HYDROCORTISONE SOD SUCCINATE 100 MG IJ SOLR
50.0000 mg | Freq: Two times a day (BID) | INTRAMUSCULAR | Status: DC
  Administered 2013-01-17 – 2013-01-18 (×3): 50 mg via INTRAVENOUS
  Filled 2013-01-17 (×3): qty 2

## 2013-01-17 NOTE — Progress Notes (Signed)
Subjective: Since I last evaluated the patient TOLERATING POS. NO MELENA OR BRBPR.  Objective: Vital signs in last 24 hours: Temp:  [98 F (36.7 C)-98.6 F (37 C)] 98.3 F (36.8 C) (04/12 0801) Pulse Rate:  [63-89] 71 (04/12 0800) Resp:  [12-19] 13 (04/12 0800) BP: (33-131)/(21-62) 122/61 mmHg (04/12 0800) SpO2:  [83 %-100 %] 99 % (04/12 0800) Weight:  [170 lb 13.7 oz (77.5 kg)] 170 lb 13.7 oz (77.5 kg) (04/12 0500) Last BM Date: 01/15/13  Intake/Output from previous day: 04/11 0701 - 04/12 0700 In: 1901 [P.O.:1080; I.V.:544; Blood:277] Out: 600 [Urine:600] Intake/Output this shift:    General appearance: alert, cooperative and no distress Resp: clear to auscultation bilaterally Cardio: regular rate and rhythm GI: soft, non-tender; bowel sounds normal; Lab Results:  Recent Labs  01/15/13 0624 01/16/13 0434 01/17/13 0457  WBC 3.8* 5.1 5.1  HGB 7.9* 7.5* 8.6*  HCT 23.4* 22.6* 24.9*  PLT 138* 129* 96*   BMET  Recent Labs  01/15/13 0624 01/16/13 0434 01/17/13 0457  NA 137 141 141  K 3.8 4.8 3.2*  CL 107 113* 108  CO2 22 21 25   GLUCOSE 90 106* 120*  BUN 45* 17 15  CREATININE 0.77 0.59 0.68  CALCIUM 7.5* 8.0* 7.9*   LFT  Recent Labs  01/16/13 0434  PROT 4.6*  ALBUMIN 2.2*  AST 20  ALT 36*  ALKPHOS 62  BILITOT 0.3   PT/INR  Recent Labs  01/14/13 2335  LABPROT 18.9*  INR 1.64*   Hepatitis Panel No results found for this basename: HEPBSAG, HCVAB, HEPAIGM, HEPBIGM,  in the last 72 hours C-Diff No results found for this basename: CDIFFTOX,  in the last 72 hours Fecal Lactopherrin No results found for this basename: FECLLACTOFRN,  in the last 72 hours  Studies/Results: No results found.  Medications: I have reviewed the patient's current medications.  Assessment/Plan: ANEMIA MOST LIKELY DUE TO CHEMORx. NO ACTIVE GIB  PLAN: 1. MONITOR FOR S/SX OF ACTIVE GIB. NO INDICATION FOR ENDOSCOPY AT THIS TIME. 2. SUPPORTIVE CARE 3. CONTINUE SOFT  DIET  LOS: 3 days   [name] 01/17/2013, 10:03 AM

## 2013-01-17 NOTE — Progress Notes (Signed)
Pt transferred to room 333. Vital signs stable at transfer. Report given to Deberah Castle RN.

## 2013-01-17 NOTE — Progress Notes (Addendum)
No problems reported overnight.  Subjective: No bleeding. Feels stronger. No shortness of breath or chest pain. Tolerating small amounts of solid diet. Has been out of bed.  Objective: Vital signs in last 24 hours: Filed Vitals:   01/17/13 0500 01/17/13 0600 01/17/13 0700 01/17/13 0801  BP: 115/40 99/41 91/39    Pulse: 65 74 72   Temp:    98.3 F (36.8 C)  TempSrc:    Oral  Resp: 13 18 13    Height:      Weight: 77.5 kg (170 lb 13.7 oz)     SpO2: 97% 100% 99%    Weight change: 0.7 kg (1 lb 8.7 oz)  Intake/Output Summary (Last 24 hours) at 01/17/13 0818 Last data filed at 01/17/13 0600  Gross per 24 hour  Intake   1191 ml  Output    600 ml  Net    591 ml   General: Alert. Forgetful. Appropriate. Eating breakfast. Comfortable. HEENT: Alopecia. Scleral edema improved Lungs clear to auscultation bilaterally without wheeze rhonchi or rales Cardiovascular regular rate rhythm without murmurs gallops rubs Abdomen obese soft nontender Extremities multiple varicosities. No edema. Neurologic: No cranial nerve deficits. No focal weakness.  Lab Results: Basic Metabolic Panel:  Recent Labs Lab 01/15/13 0624 01/16/13 0434 01/17/13 0457  NA 137 141 141  K 3.8 4.8 3.2*  CL 107 113* 108  CO2 22 21 25   GLUCOSE 90 106* 120*  BUN 45* 17 15  CREATININE 0.77 0.59 0.68  CALCIUM 7.5* 8.0* 7.9*  MG 1.7  --   --    Liver Function Tests:  Recent Labs Lab 01/15/13 0624 01/16/13 0434  AST 20 20  ALT 38* 36*  ALKPHOS 60 62  BILITOT 0.3 0.3  PROT 4.5* 4.6*  ALBUMIN 2.2* 2.2*   No results found for this basename: LIPASE, AMYLASE,  in the last 168 hours No results found for this basename: AMMONIA,  in the last 168 hours CBC:  Recent Labs Lab 01/14/13 2335  01/16/13 0434 01/17/13 0457  WBC 6.2  < > 5.1 5.1  NEUTROABS 5.4  --   --   --   HGB 9.5*  < > 7.5* 8.6*  HCT 26.9*  < > 22.6* 24.9*  MCV 92.1  < > 94.2 92.6  PLT 174  < > 129* 96*  < > = values in this interval not  displayed. Cardiac Enzymes:  Recent Labs Lab 01/14/13 2335 01/15/13 0624 01/15/13 1232  TROPONINI <0.30 <0.30 <0.30   BNP: No results found for this basename: PROBNP,  in the last 168 hours D-Dimer: No results found for this basename: DDIMER,  in the last 168 hours CBG:  Recent Labs Lab 01/16/13 0340 01/16/13 0717 01/16/13 1202 01/16/13 1658 01/16/13 2114 01/17/13 0726  GLUCAP 122* 90 228* 194* 243* 132*   Hemoglobin A1C: No results found for this basename: HGBA1C,  in the last 168 hours Fasting Lipid Panel: No results found for this basename: CHOL, HDL, LDLCALC, TRIG, CHOLHDL, LDLDIRECT,  in the last 168 hours Thyroid Function Tests:  Recent Labs Lab 01/15/13 0624  TSH 0.653   Coagulation:  Recent Labs Lab 01/12/13 01/14/13 2335  LABPROT  --  18.9*  INR 1.0 1.64*   Anemia Panel: No results found for this basename: VITAMINB12, FOLATE, FERRITIN, TIBC, IRON, RETICCTPCT,  in the last 168 hours Urine Drug Screen: Drugs of Abuse  No results found for this basename: labopia,  cocainscrnur,  labbenz,  amphetmu,  thcu,  labbarb  Alcohol Level: No results found for this basename: ETH,  in the last 168 hours Urinalysis:  Recent Labs Lab 01/15/13 0109  COLORURINE YELLOW  LABSPEC >1.030*  PHURINE 5.5  GLUCOSEU 100*  HGBUR NEGATIVE  BILIRUBINUR NEGATIVE  KETONESUR NEGATIVE  PROTEINUR NEGATIVE  UROBILINOGEN 0.2  NITRITE NEGATIVE  LEUKOCYTESUR NEGATIVE   Micro Results: Recent Results (from the past 240 hour(s))  TECHNOLOGIST REVIEW     Status: None   Collection Time    01/08/13  1:18 PM      Result Value Range Status   Technologist Review Metas and Myelocytes present   Final  MRSA PCR SCREENING     Status: None   Collection Time    01/15/13  4:53 AM      Result Value Range Status   MRSA by PCR NEGATIVE  NEGATIVE Final   Comment:            The GeneXpert MRSA Assay (FDA     approved for NASAL specimens     only), is one component of a      comprehensive MRSA colonization     surveillance program. It is not     intended to diagnose MRSA     infection nor to guide or     monitor treatment for     MRSA infections.   Studies/Results: No results found. Scheduled Meds: . atorvastatin  80 mg Oral q1800  . feeding supplement  1 Container Oral TID BM  . flecainide  100 mg Oral BID  . folic acid  1 mg Oral Daily  . hydrocortisone sod succinate (SOLU-CORTEF) injection  50 mg Intravenous Q12H  . insulin aspart  0-5 Units Subcutaneous QHS  . insulin aspart  0-9 Units Subcutaneous TID WC  . pantoprazole  40 mg Oral BID  . sodium chloride  3 mL Intravenous Q12H   Continuous Infusions: . 0.9 % NaCl with KCl 20 mEq / [name] 10 mL/hr at 01/17/13 0600   PRN Meds:.albuterol, benzonatate, HYDROmorphone (DILAUDID) injection, ipratropium, ondansetron (ZOFRAN) IV, traZODone Assessment/Plan:    Anemia, multifactorial, secondary to gastric ulcer and erosions, chemotherapy, hemodilution. Patient has had an appropriate rise in hemoglobin for a unit of blood. Continue to monitor.    Hypovolemic shock/adrenal crisis improving: Will decrease hydrocortisone to 50 mg every 12. Transfer to telemetry.    Gastric ulcer: Tolerating solid diet. No gross GI hemmorhage. Continue bid PPI    PAF (paroxysmal atrial fibrillation): Currently in sinus on flecainide.  Discussed with Ms. Bailey Mech, NP, [facility]. Would not resume Coumadin. With GI bleed, brain metastases, weakness, risk outweighs the benefit.    Lung cancer    Steroid-induced hyperglycemia with hypoglycemia on admission. No further hypoglycemia. Steroid wean will help.    Secondary malignant neoplasm of brain  Hypokalemia:  Replete IV    HYPERLIPIDEMIA    OVERWEIGHT/OBESITY    COPD (chronic obstructive pulmonary disease)    Cardiac pacemaker in situ    Delirium secondary to multiple medical problems improved. Unclear what her baseline is.    CORONARY ATHEROSCLEROSIS  NATIVE CORONARY ARTERY    Essential hypertension, benign  Disposition: Eventual assisted living facility placement. Patient lives alone.  Transfer to telemetry. Increase activity.   LOS: 3 days   [name] 01/17/2013, 8:18 AM

## 2013-01-18 DIAGNOSIS — J449 Chronic obstructive pulmonary disease, unspecified: Secondary | ICD-10-CM

## 2013-01-18 LAB — GLUCOSE, CAPILLARY
Glucose-Capillary: 151 mg/dL — ABNORMAL HIGH (ref 70–99)
Glucose-Capillary: 121 mg/dL — ABNORMAL HIGH (ref 70–99)

## 2013-01-18 LAB — BASIC METABOLIC PANEL
CO2: 26 mEq/L (ref 19–32)
Calcium: 8 mg/dL — ABNORMAL LOW (ref 8.4–10.5)
Chloride: 106 mEq/L (ref 96–112)
Sodium: 140 mEq/L (ref 135–145)

## 2013-01-18 LAB — MAGNESIUM: Magnesium: 1.5 mg/dL (ref 1.5–2.5)

## 2013-01-18 MED ORDER — HYDROCORTISONE SOD SUCCINATE 100 MG IJ SOLR
40.0000 mg | Freq: Two times a day (BID) | INTRAMUSCULAR | Status: DC
  Administered 2013-01-19 (×2): 40 mg via INTRAVENOUS
  Filled 2013-01-18 (×2): qty 2

## 2013-01-18 MED ORDER — POTASSIUM CHLORIDE CRYS ER 20 MEQ PO TBCR
40.0000 meq | EXTENDED_RELEASE_TABLET | Freq: Once | ORAL | Status: AC
  Administered 2013-01-18: 40 meq via ORAL
  Filled 2013-01-18: qty 2

## 2013-01-18 NOTE — Progress Notes (Signed)
[facility] PROGRESS NOTE  [name] GNF:[id number] DOB: 07-24-1951 DOA: 01/14/2013 PCP: Ernestine Conrad, MD  Assessment/Plan: Principal Problem:   Anemia, multifactorial: Multifactorial, felt to be secondary to gastric ulcer plus erosions plus chemotherapy as well as hemodilution. Hemoglobin yesterday was 8.6. Recheck in morning. Active Problems:   HYPERLIPIDEMIA    OVERWEIGHT/OBESITY: Stable    CORONARY ATHEROSCLEROSIS NATIVE CORONARY ARTERY: Stable    PAF (paroxysmal atrial fibrillation): Stable. Currently sinus rhythm. On flecainide. Given GI bleed, no more Coumadin.    COPD (chronic obstructive pulmonary disease): Stable.    Cardiac pacemaker in situ   Essential hypertension, benign: Stable.    Encounter for long-term (current) use of anticoagulants   Lung cancer    Steroid-induced hyperglycemia: Since resolved.    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   Hypoglycemia   Delirium secondary to multiple medical problems: Much improved. Felt steroids likely contributing factor.   Gastric ulcer Hypokalemia: Replaced.  Code Status: Full code Family Communication: No family Disposition Plan: Assisted living, in the next 2 days   Consultants:  Neijstrom-oncology  Fields-gastroenterology  Procedures:  None  Antibiotics:  None  HPI/Subjective: Patient feeling much better. More alert. Has been up in chair.  Objective: Filed Vitals:   01/18/13 0218 01/18/13 0525 01/18/13 1020 01/18/13 1404  BP: 114/48 144/74 123/77 135/67  Pulse: 66 70 72 78  Temp: 97.5 F (36.4 C) 98 F (36.7 C) 98.2 F (36.8 C) 98.2 F (36.8 C)  TempSrc: Oral Oral Oral Oral  Resp: 18  18 20   Height:      Weight:      SpO2: 99% 97% 100% 97%    Intake/Output Summary (Last 24 hours) at 01/18/13 1550 Last data filed at 01/18/13 1300  Gross per 24 hour  Intake    480 ml  Output      0 ml  Net     480 ml   Filed Weights   01/16/13 0500 01/17/13 0500 01/17/13 1431  Weight: 76.8 kg (169 lb 5 oz) 77.5 kg (170 lb 13.7 oz) 79.062 kg (174 lb 4.8 oz)    Exam:   General:  Alert and oriented x3, no acute distress  Cardiovascular: Regular rate and rhythm, S1-S2  Respiratory: Clear auscultation bilaterally  Abdomen: Soft, nontender, nondistended, positive bowel sounds  Musculoskeletal: No clubbing or cyanosis or edema   Data Reviewed: Basic Metabolic Panel:  Recent Labs Lab 01/14/13 2335 01/15/13 0624 01/16/13 0434 01/17/13 0457 01/18/13 0427  NA 133* 137 141 141 140  K 3.7 3.8 4.8 3.2* 2.8*  CL 97 107 113* 108 106  CO2 25 22 21 25 26   GLUCOSE 325* 90 106* 120* 110*  BUN 62* 45* 17 15 16   CREATININE 1.07 0.77 0.59 0.68 0.55  CALCIUM 8.2* 7.5* 8.0* 7.9* 8.0*  MG  --  1.7  --   --  1.5   Liver Function Tests:  Recent Labs Lab 01/14/13 2335 01/15/13 0624 01/16/13 0434  AST 26 20 20   ALT 45* 38* 36*  ALKPHOS 67 60 62  BILITOT 0.5 0.3 0.3  PROT 5.2* 4.5* 4.6*  ALBUMIN 2.7* 2.2* 2.2*   CBC:  Recent Labs Lab 01/14/13 2335 01/15/13 0624 01/16/13 0434 01/17/13 0457  WBC 6.2 3.8* 5.1 5.1  NEUTROABS 5.4  --   --   --   HGB 9.5* 7.9* 7.5* 8.6*  HCT 26.9* 23.4* 22.6* 24.9*  MCV 92.1 92.5 94.2 92.6  PLT 174 138* 129* 96*   Cardiac Enzymes:  Recent Labs Lab 01/14/13 2335 01/15/13 0624 01/15/13 1232  TROPONINI <0.30 <0.30 <0.30   CBG:  Recent Labs Lab 01/17/13 1127 01/17/13 1633 01/17/13 2119 01/18/13 0744 01/18/13 1157  GLUCAP 203* 119* 191* 151* 177*     Studies: No results found.  Scheduled Meds: . atorvastatin  80 mg Oral q1800  . feeding supplement  1 Container Oral TID BM  . flecainide  100 mg Oral BID  . folic acid  1 mg Oral Daily  . hydrocortisone sod succinate (SOLU-CORTEF) injection  50 mg Intravenous Q12H  . insulin aspart  0-5 Units Subcutaneous QHS  . insulin aspart  0-9 Units Subcutaneous TID WC  . pantoprazole  40 mg  Oral BID  . 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 If 7PM-7AM, please contact night-coverage at [email or web address], password [facility] 01/18/2013, 3:50 PM  LOS: 4 days

## 2013-01-18 NOTE — Progress Notes (Signed)
Subjective: Since I last evaluated the patient SHE HAS HAD NO NAUSEA OR VOMITING. NO BRBPR OR MELENA.  Objective: Vital signs in last 24 hours: Temp:  [97.5 F (36.4 C)-98.2 F (36.8 C)] 98.2 F (36.8 C) (04/13 1020) Pulse Rate:  [66-75] 72 (04/13 1020) Resp:  [15-20] 18 (04/13 1020) BP: (114-144)/(48-77) 123/77 mmHg (04/13 1020) SpO2:  [97 %-100 %] 100 % (04/13 1020) Weight:  [174 lb 4.8 oz (79.062 kg)] 174 lb 4.8 oz (79.062 kg) (04/12 1431) Last BM Date: 01/17/13  Intake/Output from previous day: 04/12 0701 - 04/13 0700 In: 340 [P.O.:240; IV Piggyback:100] Out: -  Intake/Output this shift: Total I/O In: 120 [P.O.:120] Out: -   General appearance: alert, cooperative and no distress Resp: clear to auscultation bilaterally Cardio: regular rate and rhythm GI: soft, non-tender; bowel sounds normal;  Lab Results:  Recent Labs  01/16/13 0434 01/17/13 0457  WBC 5.1 5.1  HGB 7.5* 8.6*  HCT 22.6* 24.9*  PLT 129* 96*   BMET  Recent Labs  01/16/13 0434 01/17/13 0457 01/18/13 0427  NA 141 141 140  K 4.8 3.2* 2.8*  CL 113* 108 106  CO2 21 25 26   GLUCOSE 106* 120* 110*  BUN 17 15 16   CREATININE 0.59 0.68 0.55  CALCIUM 8.0* 7.9* 8.0*   LFT  Recent Labs  01/16/13 0434  PROT 4.6*  ALBUMIN 2.2*  AST 20  ALT 36*  ALKPHOS 62  BILITOT 0.3   PT/INR No results found for this basename: LABPROT, INR,  in the last 72 hours Hepatitis Panel No results found for this basename: HEPBSAG, HCVAB, HEPAIGM, HEPBIGM,  in the last 72 hours C-Diff No results found for this basename: CDIFFTOX,  in the last 72 hours Fecal Lactopherrin No results found for this basename: FECLLACTOFRN,  in the last 72 hours  Studies/Results: No results found.  Medications: I have reviewed the patient's current medications.  Assessment/Plan:  ANEMIA MOST LIKELY DUE TO CHEMORx. NO ACTIVE GIB   PLAN:  1. MONITOR FOR S/SX OF ACTIVE GIB. NO INDICATION FOR ENDOSCOPY AT THIS TIME.  2.  SUPPORTIVE CARE  3. CONTINUE SOFT DIET    LOS: 4 days   Tishana Clinkenbeard 01/18/2013, 10:30 AM

## 2013-01-19 ENCOUNTER — Encounter (HOSPITAL_COMMUNITY): Payer: Self-pay | Admitting: Internal Medicine

## 2013-01-19 DIAGNOSIS — D72829 Elevated white blood cell count, unspecified: Secondary | ICD-10-CM

## 2013-01-19 DIAGNOSIS — D696 Thrombocytopenia, unspecified: Secondary | ICD-10-CM

## 2013-01-19 LAB — TYPE AND SCREEN
ABO/RH(D): O NEG
Antibody Screen: NEGATIVE
Unit division: 0

## 2013-01-19 LAB — CBC
HCT: 23.8 % — ABNORMAL LOW (ref 36.0–46.0)
Hemoglobin: 8.2 g/dL — ABNORMAL LOW (ref 12.0–15.0)
MCH: 31.8 pg (ref 26.0–34.0)
MCV: 92.2 fL (ref 78.0–100.0)
RBC: 2.58 MIL/uL — ABNORMAL LOW (ref 3.87–5.11)

## 2013-01-19 LAB — GLUCOSE, CAPILLARY
Glucose-Capillary: 102 mg/dL — ABNORMAL HIGH (ref 70–99)
Glucose-Capillary: 144 mg/dL — ABNORMAL HIGH (ref 70–99)
Glucose-Capillary: 157 mg/dL — ABNORMAL HIGH (ref 70–99)

## 2013-01-19 LAB — BASIC METABOLIC PANEL
CO2: 27 mEq/L (ref 19–32)
Calcium: 8 mg/dL — ABNORMAL LOW (ref 8.4–10.5)
Chloride: 108 mEq/L (ref 96–112)
Glucose, Bld: 111 mg/dL — ABNORMAL HIGH (ref 70–99)
Sodium: 142 mEq/L (ref 135–145)

## 2013-01-19 LAB — PREPARE RBC (CROSSMATCH)

## 2013-01-19 MED ORDER — OXYCODONE-ACETAMINOPHEN 10-325 MG PO TABS: 1.0000 | ORAL_TABLET | Freq: Three times a day (TID) | ORAL | Status: DC | PRN

## 2013-01-19 MED ORDER — DEXAMETHASONE 1 MG PO TABS: ORAL_TABLET | ORAL | Status: DC

## 2013-01-19 MED ORDER — DEXAMETHASONE 0.5 MG PO TABS
1.5000 mg | ORAL_TABLET | Freq: Two times a day (BID) | ORAL | Status: DC
  Administered 2013-01-19 – 2013-01-20 (×2): 1.5 mg via ORAL
  Filled 2013-01-19 (×4): qty 1

## 2013-01-19 MED ORDER — FUROSEMIDE 10 MG/ML IJ SOLN
20.0000 mg | Freq: Once | INTRAMUSCULAR | Status: AC
  Administered 2013-01-20: 20 mg via INTRAVENOUS
  Filled 2013-01-19: qty 2

## 2013-01-19 MED ORDER — PANTOPRAZOLE SODIUM 40 MG PO TBEC: 40.0000 mg | DELAYED_RELEASE_TABLET | Freq: Every day | ORAL | Status: DC

## 2013-01-19 MED ORDER — ASPIRIN EC 81 MG PO TBEC: 81.0000 mg | DELAYED_RELEASE_TABLET | Freq: Every day | ORAL | Status: DC

## 2013-01-19 MED ORDER — BOOST / RESOURCE BREEZE PO LIQD
1.0000 | Freq: Every day | ORAL | Status: DC
Start: 2013-01-20 — End: 2013-01-20

## 2013-01-19 MED ORDER — HYOSCYAMINE SULFATE 0.125 MG SL SUBL: 0.1250 mg | SUBLINGUAL_TABLET | SUBLINGUAL | Status: DC | PRN

## 2013-01-19 MED ORDER — NEBIVOLOL HCL 2.5 MG PO TABS: 2.5000 mg | ORAL_TABLET | Freq: Every day | ORAL | Status: DC

## 2013-01-19 MED ORDER — TUBERCULIN PPD 5 UNIT/0.1ML ID SOLN
5.0000 [IU] | Freq: Once | INTRADERMAL | Status: DC
  Administered 2013-01-19: 5 [IU] via INTRADERMAL
  Filled 2013-01-19: qty 0.1

## 2013-01-19 NOTE — Progress Notes (Signed)
Subjective: Patient in bed and comfortable.  She denies any complaints.  Objective: Vital signs in last 24 hours: Temp:  [97.4 F (36.3 C)-98.5 F (36.9 C)] 97.5 F (36.4 C) (04/14 1358) Pulse Rate:  [66-86] 78 (04/14 1358) Resp:  [20] 20 (04/14 1358) BP: (123-153)/(68-79) 123/69 mmHg (04/14 1358) SpO2:  [97 %-99 %] 97 % (04/14 1358) Weight:  [173 lb 4.5 oz (78.6 kg)] 173 lb 4.5 oz (78.6 kg) (04/14 0439)  Intake/Output from previous day: 04/13 0800 - 04/14 0759 In: 240 [P.O.:240] Out: -  Intake/Output this shift: Total I/O In: 240 [P.O.:240] Out: -   General appearance: alert, cooperative, appears older than stated age, no distress, moderately obese and alopecia Resp: diminished breath sounds bilaterally Cardio: regular rate and rhythm, S1, S2 normal, no murmur, click, rub or gallop GI: soft, non-tender; bowel sounds normal; no masses,  no organomegaly Extremities: extremities normal, atraumatic, no cyanosis or edema and compression stockings in place.  Lab Results:   Recent Labs  01/17/13 0457 01/19/13 0412  WBC 5.1 3.6*  HGB 8.6* 8.2*  HCT 24.9* 23.8*  PLT 96* DELTA CHECK NOTED   BMET  Recent Labs  01/18/13 0427 01/19/13 0412  NA 140 142  K 2.8* 3.1*  CL 106 108  CO2 26 27  GLUCOSE 110* 111*  BUN 16 16  CREATININE 0.55 0.57  CALCIUM 8.0* 8.0*    Studies/Results: No results found.  Medications: I have reviewed the patient's current medications.  Assessment/Plan: 1. 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  S/P 2 cycles of chemotherapy consisting of Carboplatin/Alimta on 12/18/2012 and 01/08/2013.  2. Leukocytosis, secondary to chemotherapy administration on 01/08/2013. 3. Anemia, secondary to chemotherapy versus dilutional.  Recommend 2 unit PRBC due to CAD.  Recommend maintaining a Hgb of 9 g/dL or greater due to CAD. 4. Thrombocytopenia.  Recommend platelet transfusion for  platelet count of less than 10,000 and/or active bleeding. 5. Will continue follow as an inpatient.  Recommend supportive care and when the patient is stable for discharge, patient will follow-up with Dr. Arbutus Ped.      LOS: 5 days    KEFALAS,THOMAS 01/19/2013  I agree with the above assessment.

## 2013-01-19 NOTE — Clinical Social Work Note (Signed)
Patient given ALF bed offers today, has offers from [facility] and [facility] in [place], [facility] [facility] is [place], and [facility] in [place].  Marvis Repress coming to meet w patient later this AM, patient prefers to stay in [place].  Patient will need Tb skin test prior to ALF placement.  Santa Genera, LCSW Clinical Social Worker [phone])

## 2013-01-19 NOTE — Progress Notes (Signed)
[facility] PROGRESS NOTE  [name] ZOX:[id number] DOB: [birth year] DOA: 01/14/2013 PCP: Ernestine Conrad, MD  Assessment/Plan: Principal Problem:   Anemia, multifactorial: Multifactorial, felt to be secondary to gastric ulcer plus erosions plus chemotherapy as well as hemodilution. Hemoglobin down to 8.2 today. Followed by oncology who recommended 2 unit transfusion to keep hemoglobin greater than 9 given CAD. We'll go ahead and start.  Active Problems:   HYPERLIPIDEMIA    OVERWEIGHT/OBESITY: Stable    CORONARY ATHEROSCLEROSIS NATIVE CORONARY ARTERY: Stable    PAF (paroxysmal atrial fibrillation): Stable. Currently sinus rhythm. On flecainide. Given GI bleed, no more Coumadin.    COPD (chronic obstructive pulmonary disease): Stable.    Cardiac pacemaker in situ   Essential hypertension, benign: Stable.    Encounter for long-term (current) use of anticoagulants   Lung cancer    Steroid-induced hyperglycemia: Since resolved.    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   Hypoglycemia: Discontinuation of insulin given plans to taper off Decadron altogether   Delirium secondary to multiple medical problems: Much improved. Felt steroids likely contributing factor.   Gastric ulcer: Twice a day PPI Hypokalemia: Replaced.  Code Status: Full code Family Communication: No family Disposition Plan: Assisted living, in the next 2 days   Consultants:  [facility]-oncology  Fields-gastroenterology  Procedures:  None  Antibiotics:  None  HPI/Subjective: Patient continues to feel okay. Will plan to discharge hopefully tomorrow to see assisted living facility  Objective: Filed Vitals:   01/19/13 0211 01/19/13 0439 01/19/13 0442 01/19/13 1358  BP: 153/79  137/68 123/69  Pulse: 86  66 78  Temp: 97.9 F (36.6 C)  97.4 F (36.3 C) 97.5 F (36.4 C)  TempSrc: Oral  Oral    Resp: 20  20 20   Height:      Weight:  78.6 kg (173 lb 4.5 oz)    SpO2: 97%  99% 97%    Intake/Output Summary (Last 24 hours) at 01/19/13 1744 Last data filed at 01/19/13 1512  Gross per 24 hour  Intake    240 ml  Output      0 ml  Net    240 ml   Filed Weights   01/17/13 0500 01/17/13 1431 01/19/13 0439  Weight: 77.5 kg (170 lb 13.7 oz) 79.062 kg (174 lb 4.8 oz) 78.6 kg (173 lb 4.5 oz)    Exam:   General:  Alert and oriented x3, no acute distress  Cardiovascular: Regular rate and rhythm, S1-S2  Respiratory: Clear auscultation bilaterally  Abdomen: Soft, nontender, nondistended, positive bowel sounds  Musculoskeletal: No clubbing or cyanosis or edema   Data Reviewed: Basic Metabolic Panel:  Recent Labs Lab 01/15/13 0624 01/16/13 0434 01/17/13 0457 01/18/13 0427 01/19/13 0412  NA 137 141 141 140 142  K 3.8 4.8 3.2* 2.8* 3.1*  CL 107 113* 108 106 108  CO2 22 21 25 26 27   GLUCOSE 90 106* 120* 110* 111*  BUN 45* 17 15 16 16   CREATININE 0.77 0.59 0.68 0.55 0.57  CALCIUM 7.5* 8.0* 7.9* 8.0* 8.0*  MG 1.7  --   --  1.5  --    Liver Function Tests:  Recent Labs Lab 01/14/13 2335 01/15/13 0624 01/16/13 0434  AST 26 20 20   ALT 45* 38* 36*  ALKPHOS 67 60 62  BILITOT 0.5 0.3 0.3  PROT 5.2* 4.5* 4.6*  ALBUMIN 2.7* 2.2* 2.2*   CBC:  Recent Labs  Lab 01/14/13 2335 01/15/13 0624 01/16/13 0434 01/17/13 0457 01/19/13 0412  WBC 6.2 3.8* 5.1 5.1 3.6*  NEUTROABS 5.4  --   --   --   --   HGB 9.5* 7.9* 7.5* 8.6* 8.2*  HCT 26.9* 23.4* 22.6* 24.9* 23.8*  MCV 92.1 92.5 94.2 92.6 92.2  PLT 174 138* 129* 96* DELTA CHECK NOTED   Cardiac Enzymes:  Recent Labs Lab 01/14/13 2335 01/15/13 0624 01/15/13 1232  TROPONINI <0.30 <0.30 <0.30   CBG:  Recent Labs Lab 01/18/13 1641 01/18/13 2101 01/19/13 0735 01/19/13 1156 01/19/13 1601  GLUCAP 158* 121* 102* 148* 144*     Studies: No results found.  Scheduled Meds: . atorvastatin  80 mg Oral q1800   . [START ON 01/20/2013] feeding supplement  1 Container Oral Q2000  . flecainide  100 mg Oral BID  . folic acid  1 mg Oral Daily  . hydrocortisone sod succinate (SOLU-CORTEF) injection  40 mg Intravenous Q12H  . insulin aspart  0-5 Units Subcutaneous QHS  . insulin aspart  0-9 Units Subcutaneous TID WC  . pantoprazole  40 mg Oral BID  . sodium chloride  3 mL Intravenous Q12H  . 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 If 7PM-7AM, please contact night-coverage at [email or web address], password [facility] 01/19/2013, 5:44 PM  LOS: 5 days

## 2013-01-19 NOTE — Progress Notes (Signed)
Nutrition Follow-up  INTERVENTION:  Decrease oral supplement to once daily due to pt good po meal intake and continue with transition to next level of care.   Resource Breeze po daily, provides 250 kcal and 9 grams of protein.  NUTRITION DIAGNOSIS: Increased nutrition needs; ongoing  Goal: Pt to meet >/= 90% of their estimated nutrition needs  Monitor:  Po intake, labs and wt trends   62 y.o. female  Admitting Dx: Anemia  ASSESSMENT: Pt reports no nausea or vomiting and her po intake good 75-100% recent meals. Discharge plans for ALF in process. Decrease oral nutrition supplement to daily and continue as she transitions to next level of care.   Height: Ht Readings from Last 1 Encounters:  01/15/13 5\' 2"  (1.575 m)    Weight: Wt Readings from Last 1 Encounters:  01/19/13 173 lb 4.5 oz (78.6 kg)    Ideal Body Weight: 110# (50 kg)  % Ideal Body Weight: 152%  Wt Readings from Last 10 Encounters:  01/19/13 173 lb 4.5 oz (78.6 kg)  01/19/13 173 lb 4.5 oz (78.6 kg)  01/08/13 158 lb 8 oz (71.895 kg)  12/21/12 170 lb (77.111 kg)  12/11/12 173 lb 11.2 oz (78.79 kg)  12/04/12 166 lb 8 oz (75.524 kg)  12/04/12 166 lb 8 oz (75.524 kg)  11/28/12 171 lb 4.8 oz (77.701 kg)  11/16/12 168 lb 6.9 oz (76.4 kg)  11/21/12 175 lb 12.8 oz (79.742 kg)    Usual Body Weight: 176#   % Usual Body Weight: 95%  BMI:  Body mass index is 31.69 kg/(m^2).Obesity Class I  Estimated Nutritional Needs: Kcal: 1500-1750 Protein: 70-80 gr Fluid: > 1800 ml/day  Skin: No issues noted  Diet Order: Dysphagia  EDUCATION NEEDS: -Education not appropriate at this time  No intake or output data in the 24 hours ending 01/19/13 1409  Last BM: 01/17/13   Labs:   Recent Labs Lab 01/15/13 0624  01/17/13 0457 01/18/13 0427 01/19/13 0412  NA 137  < > 141 140 142  K 3.8  < > 3.2* 2.8* 3.1*  CL 107  < > 108 106 108  CO2 22  < > 25 26 27   BUN 45*  <  > 15 16 16   CREATININE 0.77  < > 0.68 0.55 0.57  CALCIUM 7.5*  < > 7.9* 8.0* 8.0*  MG 1.7  --   --  1.5  --   GLUCOSE 90  < > 120* 110* 111*  < > = values in this interval not displayed.  CBG (last 3)   Recent Labs  01/18/13 2101 01/19/13 0735 01/19/13 1156  GLUCAP 121* 102* 148*    Scheduled Meds: . atorvastatin  80 mg Oral q1800  . feeding supplement  1 Container Oral TID BM  . flecainide  100 mg Oral BID  . folic acid  1 mg Oral Daily  . hydrocortisone sod succinate (SOLU-CORTEF) injection  40 mg Intravenous Q12H  . insulin aspart  0-5 Units Subcutaneous QHS  . insulin aspart  0-9 Units Subcutaneous TID WC  . pantoprazole  40 mg Oral BID  . sodium chloride  3 mL Intravenous Q12H  . tuberculin  5 Units Intradermal Once    Continuous Infusions:    Past Medical History  Diagnosis Date  . Atrial fibrillation   . Coronary atherosclerosis of native coronary artery     Nonobstructive, LVEF 60%  . COPD (chronic obstructive pulmonary disease)   . Mixed hyperlipidemia   . Essential  hypertension, benign   . Tachycardia-bradycardia syndrome   . Embolism - blood clot     PE  . SOB (shortness of breath) on exertion   . Allergy     Latex  . Asthma   . Anxiety   . Myocardial infarction     12 years ago ?  [name] History of radiation therapy     59 gy fraction 5 eot 07/09/12 right lung  . Lung cancer 04/29/12 dx    RLL needle bx=invasive well diff adenocarcinoma  [facility]  . Adrenal insufficiency     iatrogenic from dexamethasone    Past Surgical History  Procedure Laterality Date  . Appendectomy    . Insert / replace / remove pacemaker  3/11     [employer] - Dr. [name]  . Video bronchoscopy  02/22/2012    Procedure: VIDEO BRONCHOSCOPY WITH FLUORO;  Surgeon: Nyoka Cowden, MD;  Location: [facility];  Service: Cardiopulmonary;  Laterality: Bilateral;  . Lung biopsy  04/29/12    RLL invasive well diff adenocarcinoma  . Lung biopsy  02/22/12    RML=benign lung parenchyma,no  tumor sen  . Tonsillectomy and adenoidectomy      age 2  . Total abdominal hysterectomy  1975    1/2 right ovary intact, left salpingo-oppherctomy  . Mr brain w wo contrast  11/17/2012       . Esophagogastroduodenoscopy N/A 01/15/2013    Procedure: ESOPHAGOGASTRODUODENOSCOPY (EGD);  Surgeon: Corbin Ade, MD;  Location: [facility];  Service: Endoscopy;  Laterality: N/A;    Royann Shivers MS,RD,LDN,CSG Office: [phone] Pager: [phone]

## 2013-01-19 NOTE — Clinical Social Work Note (Signed)
[facility] came to assess patient and is willing to make bed offer contingent on patient switching chemo/oncology treatment to [facility] location.  Facility not able to transport to [place] for chemo.  Patient asked that I contact her family, spoke w brother Legrand Pitts.  Informed him that patient has two bed offers in [place] ([facility]) and that [facility] has done face to face assessment and will offer bed.  Gave brother contact information and location for [facility], also advised that family needs to help patient apply for Medicaid, DSS contact information given to brother.  Brother states that sister in law cannot transport patient to chemo in [place] but is willing to help w Medicaid application.  Santa Genera, LCSW Clinical Social Worker [phone])

## 2013-01-19 NOTE — Progress Notes (Signed)
PPD placed in right anterior forearm.  Tolerated well.  Will pass on in nurse report to read test in 48 hours.  Schonewitz, Candelaria Stagers 01/19/2013

## 2013-01-20 LAB — GLUCOSE, CAPILLARY

## 2013-01-20 LAB — HEMOGLOBIN AND HEMATOCRIT, BLOOD
HCT: 29.2 % — ABNORMAL LOW (ref 36.0–46.0)
Hemoglobin: 10.3 g/dL — ABNORMAL LOW (ref 12.0–15.0)

## 2013-01-20 LAB — TYPE AND SCREEN: ABO/RH(D): O NEG

## 2013-01-20 MED ORDER — FOLIC ACID 1 MG PO TABS: 1.0000 mg | ORAL_TABLET | Freq: Every day | ORAL | Status: AC

## 2013-01-20 MED ORDER — FLECAINIDE ACETATE 100 MG PO TABS: 100.0000 mg | ORAL_TABLET | Freq: Two times a day (BID) | ORAL | Status: AC

## 2013-01-20 MED ORDER — ROSUVASTATIN CALCIUM 40 MG PO TABS: 40.0000 mg | ORAL_TABLET | Freq: Every day | ORAL | Status: DC

## 2013-01-20 MED ORDER — POTASSIUM CHLORIDE CRYS ER 20 MEQ PO TBCR: 20.0000 meq | EXTENDED_RELEASE_TABLET | Freq: Every day | ORAL | Status: DC

## 2013-01-20 MED ORDER — NEBIVOLOL HCL 2.5 MG PO TABS: 2.5000 mg | ORAL_TABLET | Freq: Every day | ORAL | Status: DC

## 2013-01-20 MED ORDER — ALBUTEROL SULFATE (2.5 MG/3ML) 0.083% IN NEBU: 2.5000 mg | INHALATION_SOLUTION | Freq: Four times a day (QID) | RESPIRATORY_TRACT | Status: DC | PRN

## 2013-01-20 MED ORDER — MECLIZINE HCL 25 MG PO TABS
25.0000 mg | ORAL_TABLET | Freq: Three times a day (TID) | ORAL | Status: DC | PRN
Start: 2013-01-20 — End: 2013-04-27

## 2013-01-20 MED ORDER — GLUCOSE 4 G PO CHEW: 16.0000 g | CHEWABLE_TABLET | ORAL | Status: DC | PRN

## 2013-01-20 MED ORDER — DEXAMETHASONE 1 MG PO TABS: ORAL_TABLET | ORAL | Status: DC

## 2013-01-20 MED ORDER — ALBUTEROL SULFATE HFA 108 (90 BASE) MCG/ACT IN AERS: 2.0000 | INHALATION_SPRAY | Freq: Four times a day (QID) | RESPIRATORY_TRACT | Status: DC | PRN

## 2013-01-20 MED ORDER — PANTOPRAZOLE SODIUM 40 MG PO TBEC: 40.0000 mg | DELAYED_RELEASE_TABLET | Freq: Every day | ORAL | Status: DC

## 2013-01-20 MED ORDER — HYOSCYAMINE SULFATE 0.125 MG SL SUBL: 0.1250 mg | SUBLINGUAL_TABLET | SUBLINGUAL | Status: DC | PRN

## 2013-01-20 MED ORDER — IPRATROPIUM BROMIDE 0.02 % IN SOLN: 500.0000 ug | Freq: Four times a day (QID) | RESPIRATORY_TRACT | Status: AC | PRN

## 2013-01-20 MED ORDER — ASPIRIN EC 81 MG PO TBEC: 81.0000 mg | DELAYED_RELEASE_TABLET | Freq: Every day | ORAL | Status: DC

## 2013-01-20 NOTE — Progress Notes (Signed)
Pt. D/c instructions provided, IV removed, and picked up by admitting facility.

## 2013-01-20 NOTE — Clinical Social Work Note (Signed)
Patient ready for discharge today, has chosen bed offer at [facility] ([address], [place]), Darrall Dears, supervisor of facility.  Patient indicated to MD that she wants to continue to receive cancer treatment at [facility] location.  Facility willing to transport patient at discharge w their Zenaida Niece.  Brother, K Museum/gallery conservator, informed of patient choice of facility by VM message, unable to reach in person.  Lavone Neri, [facility] SW notified and aware of facility patient is discharging to, can provide follow up if necessary.  B Ratliffe, [facility] financial counselor, assisting patient w transition from MQB to full Medicaid.  Facility aware of patient income and transition to Special Assistance Medicaid.  Afternoon CSW, Cristopher Estimable, will complete discharge process.  Santa Genera, LCSW Clinical Social Worker [phone])

## 2013-01-20 NOTE — Clinical Social Work Note (Signed)
CSW notified pt that facility could provide transport and spoke with her sister-in-law Synetta Fail and provided address and phone number. Pt aware and agreeable.   Derenda Fennel, [place] [phone]

## 2013-01-20 NOTE — Clinical Social Work Note (Signed)
Call from patient's brother, Gardenia Phlegm.  Requested location information on ALFs making bed offers to patient ([facility] in [place] and [facility] in [place]), wants to visit them today.  CSW stressed that patient may be ready for discharge today and asked that he contact CSW by early afternoon.  Spoke w patient who was appreciative of brother's efforts and was agreeable to his getting information about placement options.  Santa Genera, LCSW Clinical Social Worker [phone])

## 2013-01-20 NOTE — Discharge Summary (Signed)
Physician Discharge Summary  [name] NFA:[id number] DOB: 22-May-1951 DOA: 01/14/2013  PCP: Ernestine Conrad, MD  Admit date: 01/14/2013 Discharge date: 01/20/2013  Time spent: 25 min  Recommendations for Outpatient Follow-up:  1. Pt will continue to get chemotherapy at [facility] in [place].  The ALF will provide transportation. 2. Because of concerns for bleeding ulcer, Coumadin is being discontinued. 3. Because of concerns for hypoglycemia and being able to manage insulin in the setting of steroid-induced hyperglycemia, patient's insulin is being discontinued. 4. Patient's Decadron is being tapered off. 5. For concerns of hypertension, patient's try benzo is being discontinued. Her bystolic dose is being decreased to 2.5 mg daily.  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 female. 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  EMS was called to  the home because of altered mental status; she has not been eating or drinking much today and has been nauseous. They found her hypoglycemic and hypotensive; she received orange juice, and was brought to the emergency room, where she was hypotensive and again hypoglycemic;  She was resuscitated, given a meal and her blood sugars are now well into the 200s, but patient remains confused and hypotensive with systolic in the 70s to 80s, despite2 liters normal saline.  The family reports that she recently ran out of her insulin pen, but the pharmacy refused to give a refill, because they say he does not yet due; her family therefore concluded that she's been getting confused and taking excess doses of medications, probably giving herself insulin and forgetting and then asking family to give her again. They have decided to take over management of her medications. Even prior to this admission, family had concerns and wanted to look at alternative living arrangements for the patient.  Patient as well as the hospital service for further evaluation and treatment.   Hospital Course:  Principal Problem:   Anemia, multifactorial: Patient was noted to have heme positive stool. Given hypotension, certainly concern for possibly GI bleed. Also noted to have drop in hemoglobin from admission which was 9.5 and then 7.9, although some of this could have been from hemoconcentration. It was noted that her hemoglobin was 12.5 in March, however at that point chemotherapy was then started. Patient underwent endoscopy by Dr. Jena Gauss on 4/10. She is noted to have some linear antral erosions plus a prepyloric antral ulcer. Post procedure, patient was put on twice a day PPI plus recommendations were to discontinue her Coumadin. After discussion with  cardiology and the fact that patient already on flecainide and staying in normal sinus rhythm a grade best plan was to discontinue anticoagulation Patient was given additional unit of blood.  Hemoglobin had stayed stable around 8.5. Followup on 4/14 at hemoglobin of 8.2. Felt best to keep patient's hemoglobin above 9 so she received one additional unit packed red blood cells on 4/14 and hemoglobin on day of discharge is 10.3.  Active Problems:   HYPERLIPIDEMIA   OVERWEIGHT/OBESITY   CORONARY ATHEROSCLEROSIS NATIVE CORONARY ARTERY: Stable.   PAF (paroxysmal atrial fibrillation): Patient has remained in sinus rhythm, on flecainide. Giving concerns for gastric ulcer and bleeding, no more Coumadin.    COPD (chronic obstructive pulmonary disease): Stable during this hospitalization   Cardiac pacemaker in situ   Essential hypertension, benign: Medications adjusted. 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 Still getting ongoing chemotherapy once a week. The assisted living facility she is going to we'll provide transportation. Have reviewed previous notes from oncology and prior to this admission, the plan was to wean off her Decadron.    Hypovolemic shock: Felt to be secondary to adrenal crisis as well as hypertension from inadvertently taking too much antihypertensives. Patient received stress dose steroids during this hospitalization. Over time were able to wean this down, then changed to by mouth and the plan will be to permanently wean off. Patient has been off her antihypertensives during his hospitalization. Her heart rate has remained in the 70s to 80s with a systolic blood pressure around 130s. We'll plan to resume on discharge her bystolic, but at a decreased dose of 2.5 mg previously from 10 mg. We'll discontinue her combination tribenzor altogether.    Hypoglycemia: See below. Likely, the patient taking too much insulin. As plan is to taper off her steroids altogether, can also discontinue her insulin altogether. During hospitalization, she was on sliding scale only and her sugars have remained quite  stable.    Delirium: Much improved. Felt to be secondary to multiple medical problems including hypoglycemia, hypotension and steroids. Because the plan was already to taper off her steroids, we can at this time discontinue her insulin altogether.    Gastric ulcer: See above.  Disposition: Because of patient's inability to take medicines properly and safely be alone, have worked with family and plan will be for patient to go to assisted living facility.   Procedures:  Endoscopy done 4/10 by Dr. Jena Gauss: Antral ulcer plus linear erosions  Consultations: [facility]-oncology  [facility]-gastroenterology [place] [facility]  Discharge Exam: Filed Vitals:   01/20/13 0000 01/20/13 0136 01/20/13 0400 01/20/13 0453  BP: 125/46 127/70 130/44   Pulse: 72 85 71   Temp: 97.4 F (36.3 C) 97.7 F (36.5 C) 98.3 F (36.8 C)   TempSrc: Oral Oral Oral   Resp: 18 18 18    Height:      Weight:   76.975 kg (169 lb 11.2 oz) 76.975 kg (169 lb 11.2 oz)  SpO2:  100% 98%     General: Alert and oriented x3, no acute distress  Cardiovascular: Regular rate and rhythm, S1-S2  Respiratory: Clear auscultation bilaterally  Abdomen: Soft, nontender, nondistended, positive bowel sounds  Musculoskeletal: No clubbing or cyanosis or edema   Discharge Instructions  Discharge Orders   Future Appointments Provider Department Dept Phone   01/22/2013 11:00 AM [facility] [place] [facility] [phone]   01/29/2013 1:00 PM Dava Najjar [place]  [facility] [phone]   01/29/2013 1:45 PM Marlana Salvage [facility] [phone]   01/29/2013 2:45 PM [facility] [place] [facility] [phone]   02/05/2013 11:15 AM Mauri [place] [facility] [phone]   02/12/2013 11:00 AM [facility] [place] [facility] [phone]   03/25/2013 1:30 PM Jonna Coup, MD [place] [facility] [phone]   Future Orders Complete By Expires     Diet - low sodium heart healthy  As directed     Increase activity slowly  As directed         Medication List    STOP taking these medications       insulin aspart protamine-insulin aspart (70-30) 100 UNIT/ML injection  Commonly known as:  NOVOLOG MIX 70/30 FLEXPEN     TRIBENZOR 40-5-25 MG Tabs  Generic drug:  Olmesartan-Amlodipine-HCTZ     warfarin 5 MG tablet  Commonly known as:  COUMADIN      TAKE these medications       albuterol (2.5 MG/3ML) 0.083% nebulizer solution  Commonly known as:  PROVENTIL  Take 2.5 mg by nebulization every 6 (six) hours as needed for wheezing or shortness of breath.     PROAIR HFA 108 (90 BASE) MCG/ACT inhaler  Generic drug:  albuterol  Inhale 2 puffs into the lungs every 6 (six) hours as needed for wheezing or shortness of breath.     aspirin EC 81 MG tablet  Take 1 tablet (81 mg total) by mouth daily. Restart this medicine on 5/14     dexamethasone 1 MG tablet  Commonly known as:  DECADRON  Take 1 mg po bid x 1 week, then 1 mg po daily x 1 week, then 1mg  every other day for 1 week, then stop.     flecainide 100 MG tablet  Commonly known as:  TAMBOCOR  Take 1 tablet (100 mg total) by mouth 2 (two) times daily.     folic acid 1 MG tablet  Commonly known as:  FOLVITE  Take 1 tablet (1 mg total) by mouth daily.     glucose 4 GM chewable tablet  Chew 4 tablets (16 g total) by mouth as needed for low blood sugar.     hyoscyamine 0.125 MG SL tablet  Commonly known as:  LEVSIN SL  Take 1 tablet (0.125 mg total) by mouth every 4 (four) hours as needed for cramping.     ipratropium 0.02 % nebulizer solution  Commonly known as:  ATROVENT  Take 500 mcg by nebulization 4 (four) times daily as needed for wheezing (shortness of breath).     meclizine 25 MG tablet  Commonly known as:  ANTIVERT  Take 25 mg by mouth 3 (three)  times daily as needed.     MELATONIN ER PO  Take 5 mg by mouth at bedtime.     nebivolol 2.5 MG tablet  Commonly known as:  BYSTOLIC  Take 1 tablet (2.5 mg total) by mouth daily.     oxyCODONE-acetaminophen 10-325 MG per tablet  Commonly known as:  PERCOCET  Take 1 tablet by mouth every 8 (eight) hours as needed for pain. Takes once daily  And prn for knees     pantoprazole 40 MG tablet  Commonly known as:  PROTONIX  Take 1 tablet (40 mg total) by mouth daily. 40 mg po BID x 14 days, then daily  continuous.     potassium chloride SA 20 MEQ tablet  Commonly known as:  KLOR-CON M20  Take 1 tablet (20 mEq total) by mouth daily.     rosuvastatin 40 MG tablet  Commonly known as:  CRESTOR  Take 1 tablet (40 mg total) by mouth daily.           Follow-up Information   Follow up with Ernestine Conrad, MD In 6 weeks.   Contact information:   [facility] [address] [place] [address] [phone]        The results of significant diagnostics from this hospitalization (including imaging, microbiology, ancillary and laboratory) are listed below for reference.    Significant Diagnostic Studies: Ct Head Wo Contrast  01/15/2013   IMPRESSION:  1.  Decrease in size of a right cerebellar lesion. 2.  Heterogeneity of the lesions suggests some component of hemorrhage, similar to the prior study.   Original Report Authenticated By: Marin Roberts, M.D.    Dg Chest Portable 1 View  01/14/2013 IMPRESSION:  1.  No acute cardiopulmonary disease. 2.  Right middle lobe pulmonary nodule.   Original Report Authenticated By: Marin Roberts, M.D.     Microbiology: Recent Results (from the past 240 hour(s))  MRSA PCR SCREENING     Status: None   Collection Time    01/15/13  4:53 AM      Result Value Range Status   MRSA by PCR NEGATIVE  NEGATIVE Final   Comment:            The GeneXpert MRSA Assay (FDA     approved for NASAL specimens     only), is one component of a      comprehensive MRSA colonization     surveillance program. It is not     intended to diagnose MRSA     infection nor to guide or     monitor treatment for     MRSA infections.     Labs: Basic Metabolic Panel:  Recent Labs Lab 01/15/13 0624 01/16/13 0434 01/17/13 0457 01/18/13 0427 01/19/13 0412  NA 137 141 141 140 142  K 3.8 4.8 3.2* 2.8* 3.1*  CL 107 113* 108 106 108  CO2 22 21 25 26 27   GLUCOSE 90 106* 120* 110* 111*  BUN 45* 17 15 16 16   CREATININE 0.77 0.59 0.68 0.55 0.57  CALCIUM 7.5* 8.0* 7.9* 8.0* 8.0*  MG 1.7  --   --  1.5  --    Liver Function Tests:  Recent Labs Lab 01/14/13 2335 01/15/13 0624 01/16/13 0434  AST 26 20 20   ALT 45* 38* 36*  ALKPHOS 67 60 62  BILITOT 0.5 0.3 0.3  PROT 5.2* 4.5* 4.6*  ALBUMIN 2.7* 2.2* 2.2*   CBC:  Recent Labs Lab 01/14/13 2335 01/15/13 0624 01/16/13 0434 01/17/13 0457 01/19/13 0412 01/20/13 0109  WBC 6.2 3.8* 5.1 5.1 3.6*  --   NEUTROABS 5.4  --   --   --   --   --   HGB 9.5* 7.9* 7.5* 8.6* 8.2* 10.3*  HCT 26.9* 23.4* 22.6* 24.9* 23.8* 29.2*  MCV 92.1 92.5 94.2 92.6 92.2  --   PLT 174 138* 129* 96* DELTA CHECK NOTED  --    Cardiac Enzymes:  Recent Labs Lab 01/14/13 2335 01/15/13 0624 01/15/13 1232  TROPONINI <0.30 <0.30 <0.30   CBG:  Recent Labs Lab 01/19/13 1156 01/19/13 1601 01/19/13 2018 01/20/13 0732 01/20/13 1137  GLUCAP 148* 144* 157*  137* 176*       Signed:  KRISHNAN,SENDIL K  [facility] 01/20/2013, 1:01 PM

## 2013-01-22 ENCOUNTER — Ambulatory Visit: Payer: Medicare Other

## 2013-01-22 ENCOUNTER — Telehealth: Payer: Self-pay | Admitting: *Deleted

## 2013-01-22 ENCOUNTER — Other Ambulatory Visit (HOSPITAL_BASED_OUTPATIENT_CLINIC_OR_DEPARTMENT_OTHER): Payer: Medicare Other | Admitting: Lab

## 2013-01-22 DIAGNOSIS — C3490 Malignant neoplasm of unspecified part of unspecified bronchus or lung: Secondary | ICD-10-CM

## 2013-01-22 LAB — COMPREHENSIVE METABOLIC PANEL (CC13)
AST: 50 U/L — ABNORMAL HIGH (ref 5–34)
Alkaline Phosphatase: 94 U/L (ref 40–150)
BUN: 17.4 mg/dL (ref 7.0–26.0)
Creatinine: 0.7 mg/dL (ref 0.6–1.1)
Glucose: 150 mg/dl — ABNORMAL HIGH (ref 70–99)
Potassium: 2.7 mEq/L — CL (ref 3.5–5.1)
Total Bilirubin: 1.11 mg/dL (ref 0.20–1.20)

## 2013-01-22 LAB — CBC WITH DIFFERENTIAL/PLATELET
BASO%: 0 % (ref 0.0–2.0)
EOS%: 1.2 % (ref 0.0–7.0)
HCT: 32 % — ABNORMAL LOW (ref 34.8–46.6)
LYMPH%: 10.5 % — ABNORMAL LOW (ref 14.0–49.7)
MCH: 31.1 pg (ref 25.1–34.0)
MCHC: 34.1 g/dL (ref 31.5–36.0)
MCV: 91.2 fL (ref 79.5–101.0)
MONO%: 12.4 % (ref 0.0–14.0)
NEUT%: 75.9 % (ref 38.4–76.8)
lymph#: 0.4 10*3/uL — ABNORMAL LOW (ref 0.9–3.3)

## 2013-01-22 NOTE — Telephone Encounter (Signed)
K 2.7, per Tiana Loft, pt needs to take K x 10 days.  On pt's med list it states she takes daily.  Spoke with pt, she states she does not take it.  Informed her she needs to take x 10 days and then take if this has been prescribed by one of her MD's.  She verbalized understanding and states she has enough of the K at home to take and does not need a new rx.  SLJ

## 2013-01-23 NOTE — Progress Notes (Signed)
Quick Note:  Call patient with the result and order KDur 40 meq po qd x 10 days. ______ 

## 2013-01-27 ENCOUNTER — Encounter: Payer: Self-pay | Admitting: Internal Medicine

## 2013-01-29 ENCOUNTER — Encounter: Payer: Self-pay | Admitting: Internal Medicine

## 2013-01-29 ENCOUNTER — Telehealth: Payer: Self-pay | Admitting: *Deleted

## 2013-01-29 ENCOUNTER — Other Ambulatory Visit (HOSPITAL_BASED_OUTPATIENT_CLINIC_OR_DEPARTMENT_OTHER): Payer: Medicare Other | Admitting: Lab

## 2013-01-29 ENCOUNTER — Ambulatory Visit (HOSPITAL_BASED_OUTPATIENT_CLINIC_OR_DEPARTMENT_OTHER): Payer: Medicare Other

## 2013-01-29 ENCOUNTER — Ambulatory Visit (HOSPITAL_BASED_OUTPATIENT_CLINIC_OR_DEPARTMENT_OTHER): Payer: Medicare Other | Admitting: Physician Assistant

## 2013-01-29 ENCOUNTER — Telehealth: Payer: Self-pay | Admitting: Internal Medicine

## 2013-01-29 VITALS — BP 139/83 | HR 82 | Temp 97.8°F | Resp 20 | Ht 62.0 in | Wt 155.8 lb

## 2013-01-29 DIAGNOSIS — B370 Candidal stomatitis: Secondary | ICD-10-CM

## 2013-01-29 DIAGNOSIS — C342 Malignant neoplasm of middle lobe, bronchus or lung: Secondary | ICD-10-CM

## 2013-01-29 DIAGNOSIS — C3430 Malignant neoplasm of lower lobe, unspecified bronchus or lung: Secondary | ICD-10-CM

## 2013-01-29 DIAGNOSIS — Z5111 Encounter for antineoplastic chemotherapy: Secondary | ICD-10-CM

## 2013-01-29 DIAGNOSIS — C3490 Malignant neoplasm of unspecified part of unspecified bronchus or lung: Secondary | ICD-10-CM

## 2013-01-29 DIAGNOSIS — C7949 Secondary malignant neoplasm of other parts of nervous system: Secondary | ICD-10-CM

## 2013-01-29 LAB — CBC WITH DIFFERENTIAL/PLATELET
Basophils Absolute: 0 10*3/uL (ref 0.0–0.1)
Eosinophils Absolute: 0 10*3/uL (ref 0.0–0.5)
HCT: 32.2 % — ABNORMAL LOW (ref 34.8–46.6)
HGB: 10.6 g/dL — ABNORMAL LOW (ref 11.6–15.9)
MONO#: 1.4 10*3/uL — ABNORMAL HIGH (ref 0.1–0.9)
NEUT#: 9.2 10*3/uL — ABNORMAL HIGH (ref 1.5–6.5)
NEUT%: 78.9 % — ABNORMAL HIGH (ref 38.4–76.8)
WBC: 11.7 10*3/uL — ABNORMAL HIGH (ref 3.9–10.3)
lymph#: 1 10*3/uL (ref 0.9–3.3)

## 2013-01-29 LAB — COMPREHENSIVE METABOLIC PANEL (CC13)
Albumin: 3.3 g/dL — ABNORMAL LOW (ref 3.5–5.0)
BUN: 16.3 mg/dL (ref 7.0–26.0)
Calcium: 9.3 mg/dL (ref 8.4–10.4)
Chloride: 104 mEq/L (ref 98–107)
Glucose: 144 mg/dl — ABNORMAL HIGH (ref 70–99)
Potassium: 4 mEq/L (ref 3.5–5.1)
Sodium: 139 mEq/L (ref 136–145)
Total Protein: 6.6 g/dL (ref 6.4–8.3)

## 2013-01-29 MED ORDER — FLUCONAZOLE 100 MG PO TABS: ORAL_TABLET | ORAL | Status: DC

## 2013-01-29 MED ORDER — SODIUM CHLORIDE 0.9 % IV SOLN
Freq: Once | INTRAVENOUS | Status: AC
  Administered 2013-01-29: 16:00:00 via INTRAVENOUS

## 2013-01-29 MED ORDER — DEXAMETHASONE SODIUM PHOSPHATE 4 MG/ML IJ SOLN
20.0000 mg | Freq: Once | INTRAMUSCULAR | Status: AC
  Administered 2013-01-29: 20 mg via INTRAVENOUS

## 2013-01-29 MED ORDER — SODIUM CHLORIDE 0.9 % IV SOLN
500.0000 mg/m2 | Freq: Once | INTRAVENOUS | Status: AC
  Administered 2013-01-29: 925 mg via INTRAVENOUS
  Filled 2013-01-29: qty 37

## 2013-01-29 MED ORDER — ONDANSETRON 16 MG/50ML IVPB (CHCC)
16.0000 mg | Freq: Once | INTRAVENOUS | Status: AC
  Administered 2013-01-29: 16 mg via INTRAVENOUS

## 2013-01-29 MED ORDER — SODIUM CHLORIDE 0.9 % IV SOLN
490.0000 mg | Freq: Once | INTRAVENOUS | Status: AC
  Administered 2013-01-29: 490 mg via INTRAVENOUS
  Filled 2013-01-29: qty 49

## 2013-01-29 MED ORDER — CYANOCOBALAMIN 1000 MCG/ML IJ SOLN
1000.0000 ug | Freq: Once | INTRAMUSCULAR | Status: AC
  Administered 2013-01-29: 1000 ug via INTRAMUSCULAR

## 2013-01-29 MED ORDER — LORATADINE 10 MG PO TABS: 10.0000 mg | ORAL_TABLET | Freq: Every day | ORAL | Status: DC

## 2013-01-29 NOTE — Patient Instructions (Addendum)
[facility] Discharge Instructions for Patients Receiving Chemotherapy  Today you received the following chemotherapy agents Alimta/Carboplatin.  To help prevent nausea and vomiting after your treatment, we encourage you to take your nausea medication as prescribed.   If you develop nausea and vomiting that is not controlled by your nausea medication, call the clinic. If it is after clinic hours your family physician or the after hours number for the clinic or go to the Emergency Department.   BELOW ARE SYMPTOMS THAT SHOULD BE REPORTED IMMEDIATELY:  *FEVER GREATER THAN 100.5 F  *CHILLS WITH OR WITHOUT FEVER  NAUSEA AND VOMITING THAT IS NOT CONTROLLED WITH YOUR NAUSEA MEDICATION  *UNUSUAL SHORTNESS OF BREATH  *UNUSUAL BRUISING OR BLEEDING  TENDERNESS IN MOUTH AND THROAT WITH OR WITHOUT PRESENCE OF ULCERS  *URINARY PROBLEMS  *BOWEL PROBLEMS  UNUSUAL RASH Items with * indicate a potential emergency and should be followed up as soon as possible.  Feel free to call the clinic you have any questions or concerns. The clinic phone number is [phone].   I have been informed and understand all the instructions given to me. I know to contact the clinic, my physician, or go to the Emergency Department if any problems should occur. I do not have any questions at this time, but understand that I may call the clinic during office hours   should I have any questions or need assistance in obtaining follow up care.    __________________________________________  _____________  __________ Signature of Patient or Authorized Representative            Date                   Time    __________________________________________ Nurse's Signature

## 2013-01-29 NOTE — Telephone Encounter (Signed)
gv and printed appt sched and avs for pt....michelle added tx...  °

## 2013-01-29 NOTE — Telephone Encounter (Signed)
Per staff message and POF I have scheduled appts.  JMW  

## 2013-01-29 NOTE — Patient Instructions (Addendum)
Continue weekly labs Follow up with Dr. Arbutus Ped in 3 weeks with a restaging CT scan of your chest, abdomen and pelvis to re-evaluate your disease

## 2013-01-30 ENCOUNTER — Telehealth: Payer: Self-pay | Admitting: Medical Oncology

## 2013-01-30 NOTE — Telephone Encounter (Signed)
Pt resides in [employer] home through [facility] [place] [facility] [place] caregiver service. They request her lab only visits be done at [facility] lab. They also request order for CBG be faxed to [facility] at [phone].

## 2013-02-02 ENCOUNTER — Telehealth: Payer: Self-pay | Admitting: Medical Oncology

## 2013-02-02 NOTE — Telephone Encounter (Signed)
Faxed lab order to [facility] and cancelled labs here.

## 2013-02-02 NOTE — Telephone Encounter (Addendum)
I told stephanie that glucose will be done by [facility] -there is o need to order cbg from [facility]. I faxed lab order to [facility] today.

## 2013-02-03 NOTE — Progress Notes (Signed)
I     [facility] [phone]   [phone]  OFFICE PROGRESS NOTE  Ernestine Conrad, MD [facility] [address] [place] [address]  DIAGNOSIS: Metastatic non-small cell lung cancer initially diagnosed as synchronous primary non-small cell lung cancer, adenocarcinoma with negative EGFR mutation and negative ALK gene translocation involving the right middle lobe and lower lobe diagnosed in July of 2013.   PRIOR THERAPY:  1) Stereotactic radiotherapy to the right lung lesions under the care of Dr. Mitzi Hansen completed on 07/09/2012.  2) whole brain irradiation under the care of Dr. Mitzi Hansen completed on 12/04/2012  CURRENT THERAPY: Systemic chemotherapy with carboplatin for AUC of 5 and Alimta 500 mg/M2 every 3 weeks. Status post 2 cycles   INTERVAL HISTORY: [name] 62 y.o. female returns to the clinic today for followup visit. She was recently hospitalized for hypoglycemia. She is now in an assisted living facility. She is accompanied today by her sister-in-law as well as a caregiver from the assisted living facility. She is currently off all insulin and is no longer checking her blood sugars. The sister-in-law thinks that the patient was confused and was taking her insulin more frequently than it was prescribed and feels that this may have resulted in the hypoglycemia. Patient complains of her eyes watering and feels it is related to seasonal allergies. She voiced no other specific complaints and is ready to proceed with her third cycle of systemic chemotherapy with carboplatin and Alimta. Her medication list is brought from the assisted living facility and of note the patient is not on Coumadin. The patient denied having any significant chest pain, shortness breath, cough or hemoptysis. She has no significant weight loss or night sweats.   MEDICAL HISTORY: Past Medical History  Diagnosis Date  . Atrial fibrillation   . Coronary atherosclerosis of  native coronary artery     Nonobstructive, LVEF 60%  . COPD (chronic obstructive pulmonary disease)   . Mixed hyperlipidemia   . Essential hypertension, benign   . Tachycardia-bradycardia syndrome   . Embolism - blood clot     PE  . SOB (shortness of breath) on exertion   . Allergy     Latex  . Asthma   . Anxiety   . Myocardial infarction     12 years ago ?  [name] History of radiation therapy     59 gy fraction 5 eot 07/09/12 right lung  . Lung cancer 04/29/12 dx    RLL needle bx=invasive well diff adenocarcinoma  [facility]  . 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  MEDICATIONS:  Current Outpatient Prescriptions  Medication Sig Dispense Refill  . albuterol (PROAIR HFA) 108 (90 BASE) MCG/ACT inhaler Inhale 2 puffs into the lungs every 6 (six) hours as needed for wheezing or shortness of breath.  1 Inhaler  1  . albuterol (PROVENTIL) (2.5 MG/3ML) 0.083% nebulizer solution Take 3 mLs (2.5 mg total) by nebulization every 6 (six) hours as needed for wheezing or shortness of breath.  75 mL  1  . aspirin EC 81 MG tablet Take 1 tablet (81 mg total) by mouth daily. Restart this medicine on 5/14  30 tablet  0  . dexamethasone (DECADRON) 1 MG tablet Take 1 mg po bid x 1 week, then 1 mg po daily x 1 week, then 1mg  every other day for 1 week, then stop.  24 tablet  0  .  flecainide (TAMBOCOR) 100 MG tablet Take 1 tablet (100 mg total) by mouth 2 (two) times daily.  60 tablet  5  . fluconazole (DIFLUCAN) 100 MG tablet Take 2 tablets by mouth on day 1, then take 1 tablet by mouth daily until completed. Do not take Crestor while taking Diflucan (fluconazole)  16 tablet  0  . folic acid (FOLVITE) 1 MG tablet Take 1 tablet (1 mg total) by mouth daily.  30 tablet  2  . glucose 4 GM chewable tablet Chew 4 tablets (16 g total) by mouth as needed for low blood sugar.  50 tablet  12  . hyoscyamine (LEVSIN SL) 0.125 MG SL tablet Take 1 tablet (0.125 mg total) by  mouth every 4 (four) hours as needed for cramping.  30 tablet  0  . ipratropium (ATROVENT) 0.02 % nebulizer solution Take 2.5 mLs (500 mcg total) by nebulization 4 (four) times daily as needed for wheezing (shortness of breath).  75 mL  1  . loratadine (CLARITIN) 10 MG tablet Take 1 tablet (10 mg total) by mouth daily.  30 tablet  0  . meclizine (ANTIVERT) 25 MG tablet Take 1 tablet (25 mg total) by mouth 3 (three) times daily as needed.  30 tablet  0  . MELATONIN ER PO Take 5 mg by mouth at bedtime.      . nebivolol (BYSTOLIC) 2.5 MG tablet Take 1 tablet (2.5 mg total) by mouth daily.  30 tablet  0  . oxyCODONE-acetaminophen (PERCOCET) 10-325 MG per tablet Take 1 tablet by mouth every 8 (eight) hours as needed for pain. Takes once daily  And prn for knees  30 tablet  0  . pantoprazole (PROTONIX) 40 MG tablet Take 1 tablet (40 mg total) by mouth daily. 40 mg po BID x 14 days, then daily continuous.  30 tablet  2  . potassium chloride SA (KLOR-CON M20) 20 MEQ tablet Take 1 tablet (20 mEq total) by mouth daily.  30 tablet  3  . rosuvastatin (CRESTOR) 40 MG tablet Take 1 tablet (40 mg total) by mouth daily.  30 tablet  11   No current facility-administered medications for this visit.    SURGICAL HISTORY:  Past Surgical History  Procedure Laterality Date  . Appendectomy    . Insert / replace / remove pacemaker  3/11     [employer] - Dr. [name]  . Video bronchoscopy  02/22/2012    Procedure: VIDEO BRONCHOSCOPY WITH FLUORO;  Surgeon: Nyoka Cowden, MD;  Location: [facility];  Service: Cardiopulmonary;  Laterality: Bilateral;  . Lung biopsy  04/29/12    RLL invasive well diff adenocarcinoma  . Lung biopsy  02/22/12    RML=benign lung parenchyma,no tumor sen  . Tonsillectomy and adenoidectomy      age 30  . Total abdominal hysterectomy  1975    1/2 right ovary intact, left salpingo-oppherctomy  . Mr brain w wo contrast  11/17/2012       . Esophagogastroduodenoscopy N/A 01/15/2013    Procedure:  ESOPHAGOGASTRODUODENOSCOPY (EGD);  Surgeon: Corbin Ade, MD;  [facility];  Service: Endoscopy;  Laterality: N/A;    REVIEW OF SYSTEMS:  Pertinent items are noted in HPI.   PHYSICAL EXAMINATION: General appearance: alert, cooperative and no distress Head: Normocephalic, without obvious abnormality, atraumatic Neck: no adenopathy Lymph nodes: Cervical, supraclavicular, and axillary nodes normal. Resp: clear to auscultation bilaterally Cardio: regular rate and rhythm, S1, S2 normal, no murmur, click, rub or gallop GI:  soft, non-tender; bowel sounds normal; no masses,  no organomegaly Extremities: extremities normal, atraumatic, no cyanosis or edema Neurologic: Alert and oriented X 3, normal strength and tone. Normal symmetric reflexes. Normal coordination and gait Oropharynx: Reveals thrush  ECOG PERFORMANCE STATUS: 1 - Symptomatic but completely ambulatory  Blood pressure 139/83, pulse 82, temperature 97.8 F (36.6 C), temperature source Oral, resp. rate 20, height 5\' 2"  (1.575 m), weight 155 lb 12.8 oz (70.67 kg).  LABORATORY DATA: Lab Results  Component Value Date   WBC 11.7* 01/29/2013   HGB 10.6* 01/29/2013   HCT 32.2* 01/29/2013   MCV 95.0 01/29/2013   PLT 240 01/29/2013      Chemistry      Component Value Date/Time   NA 139 01/29/2013 1237   NA 142 01/19/2013 0412   K 4.0 01/29/2013 1237   K 3.1* 01/19/2013 0412   CL 104 01/29/2013 1237   CL 108 01/19/2013 0412   CO2 24 01/29/2013 1237   CO2 27 01/19/2013 0412   BUN 16.3 01/29/2013 1237   BUN 16 01/19/2013 0412   CREATININE 0.7 01/29/2013 1237   CREATININE 0.57 01/19/2013 0412   CREATININE 0.80 09/24/2011 1058   GLU Above linear limit,repeat venous draw for confirmation 12/18/2012 1643      Component Value Date/Time   CALCIUM 9.3 01/29/2013 1237   CALCIUM 8.0* 01/19/2013 0412   ALKPHOS 80 01/29/2013 1237   ALKPHOS 62 01/16/2013 0434   AST 26 01/29/2013 1237   AST 20 01/16/2013 0434   ALT 55 01/29/2013 1237   ALT  36* 01/16/2013 0434   BILITOT 1.09 01/29/2013 1237   BILITOT 0.3 01/16/2013 0434       RADIOGRAPHIC STUDIES: Ct Head Wo Contrast  11/16/2012  *RADIOLOGY REPORT*  Clinical Data: Vomiting, dizziness  CT HEAD WITHOUT CONTRAST  Technique:  Contiguous axial images were obtained from the base of the skull through the vertex without contrast.  Comparison: CT brain scan of 05/21/2012  Findings: There is a large rounded lesion within the right cerebellum displacing the midline to the left and slightly impinging upon the fourth ventricle.  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  There is a question of a second lesion high in the right cerebellum or possibly in the right posterior parietal lobe measuring 2.5 x 2.0 cm.  No acute hemorrhage is seen.  The ventricular system is stable in size and configuration and the septum is in a midline position.  MRI is recommended for more sensitive assessment of possible additional metastatic lesions.  Mild small vessel ischemic change is present in the periventricular white matter.  No calvarial abnormality is seen.  IMPRESSION:  1.  Rounded peripherally enhancing lesion in the right cerebellar hemisphere with questionable second lesion possibly the right posterior parietal lobe suspicious for metastatic lesions.  MRI of the brain is recommended. 2.  Mild small vessel ischemic change.  I discussed the findings of this study with Dr. Estell Harpin on 11/16/2012 at 4:50 p.m.   Original Report Authenticated By: Dwyane Dee, M.D.     ASSESSMENT/PLAN: This is a very pleasant 62 years old white female with metastatic non-small cell lung cancer, adenocarcinoma with recent brain metastasis status post whole brain irradiation. She's now status post one cycle of systemic chemotherapy with carboplatin for an AUC of 5 and Alimta 500 mg region squared given every 3 weeks. The patient was discussed with Dr. Arbutus Ped. She may  take Claritin  10 mg daily for the seasonal allergy symptoms. We will also request that she have her weekly labs drawn at the assisted living facility with the results faxed to Dr. Arbutus Ped. We will closely monitor her blood glucose in these weekly labs The physical examination today revealed the patient has oral candidiasis and prescription for Diflucan was given to the caregiver from the assisted living facility. She will follow up with Dr. Arbutus Ped in 3 weeks with restaging CT scan of the chest, abdomen and pelvis with contrast to reevaluate her disease.   [name], Kari Shippey E, PA-C    All questions were answered. The patient knows to call the clinic with any problems, questions or concerns. We can certainly see the patient much sooner if necessary.  I spent 20 minutes counseling the patient face to face. The total time spent in the appointment was 30 minutes.

## 2013-02-04 ENCOUNTER — Telehealth: Payer: Self-pay | Admitting: Medical Oncology

## 2013-02-04 NOTE — Telephone Encounter (Signed)
[facility] care staff will pick up contrast for pts CT scan in may

## 2013-02-05 ENCOUNTER — Other Ambulatory Visit: Payer: Medicare Other | Admitting: Lab

## 2013-02-12 ENCOUNTER — Other Ambulatory Visit: Payer: Medicare Other

## 2013-02-16 ENCOUNTER — Ambulatory Visit (HOSPITAL_COMMUNITY)
Admission: RE | Admit: 2013-02-16 | Discharge: 2013-02-16 | Disposition: A | Discharge status: Home / Self Care | Payer: Medicare Other | Source: Ambulatory Visit | Attending: Physician Assistant | Admitting: Physician Assistant

## 2013-02-16 DIAGNOSIS — J8410 Pulmonary fibrosis, unspecified: Secondary | ICD-10-CM | POA: Insufficient documentation

## 2013-02-16 DIAGNOSIS — Z79899 Other long term (current) drug therapy: Secondary | ICD-10-CM | POA: Insufficient documentation

## 2013-02-16 DIAGNOSIS — K5730 Diverticulosis of large intestine without perforation or abscess without bleeding: Secondary | ICD-10-CM | POA: Insufficient documentation

## 2013-02-16 DIAGNOSIS — C7970 Secondary malignant neoplasm of unspecified adrenal gland: Secondary | ICD-10-CM | POA: Insufficient documentation

## 2013-02-16 DIAGNOSIS — J438 Other emphysema: Secondary | ICD-10-CM | POA: Insufficient documentation

## 2013-02-16 DIAGNOSIS — Z923 Personal history of irradiation: Secondary | ICD-10-CM | POA: Insufficient documentation

## 2013-02-16 DIAGNOSIS — I700 Atherosclerosis of aorta: Secondary | ICD-10-CM | POA: Insufficient documentation

## 2013-02-16 DIAGNOSIS — D739 Disease of spleen, unspecified: Secondary | ICD-10-CM | POA: Insufficient documentation

## 2013-02-16 DIAGNOSIS — C3490 Malignant neoplasm of unspecified part of unspecified bronchus or lung: Secondary | ICD-10-CM

## 2013-02-16 DIAGNOSIS — M47817 Spondylosis without myelopathy or radiculopathy, lumbosacral region: Secondary | ICD-10-CM | POA: Insufficient documentation

## 2013-02-16 DIAGNOSIS — N281 Cyst of kidney, acquired: Secondary | ICD-10-CM | POA: Insufficient documentation

## 2013-02-16 DIAGNOSIS — Z90710 Acquired absence of both cervix and uterus: Secondary | ICD-10-CM | POA: Insufficient documentation

## 2013-02-16 MED ORDER — IOHEXOL 300 MG/ML  SOLN
100.0000 mL | Freq: Once | INTRAMUSCULAR | Status: AC | PRN
  Administered 2013-02-16: 100 mL via INTRAVENOUS

## 2013-02-16 MED ORDER — IOHEXOL 300 MG/ML  SOLN
50.0000 mL | Freq: Once | INTRAMUSCULAR | Status: AC | PRN
  Administered 2013-02-16: 50 mL via ORAL

## 2013-02-19 ENCOUNTER — Other Ambulatory Visit (HOSPITAL_BASED_OUTPATIENT_CLINIC_OR_DEPARTMENT_OTHER): Payer: Medicare Other | Admitting: Lab

## 2013-02-19 ENCOUNTER — Ambulatory Visit (HOSPITAL_BASED_OUTPATIENT_CLINIC_OR_DEPARTMENT_OTHER): Payer: Medicare Other

## 2013-02-19 ENCOUNTER — Encounter: Payer: Self-pay | Admitting: Internal Medicine

## 2013-02-19 ENCOUNTER — Telehealth: Payer: Self-pay | Admitting: *Deleted

## 2013-02-19 ENCOUNTER — Telehealth: Payer: Self-pay | Admitting: Internal Medicine

## 2013-02-19 ENCOUNTER — Ambulatory Visit (HOSPITAL_BASED_OUTPATIENT_CLINIC_OR_DEPARTMENT_OTHER): Payer: Medicare Other | Admitting: Internal Medicine

## 2013-02-19 ENCOUNTER — Ambulatory Visit: Payer: Medicare Other | Admitting: Lab

## 2013-02-19 VITALS — BP 156/83 | HR 94 | Temp 97.0°F | Resp 17 | Ht 62.0 in | Wt 154.8 lb

## 2013-02-19 DIAGNOSIS — C3490 Malignant neoplasm of unspecified part of unspecified bronchus or lung: Secondary | ICD-10-CM

## 2013-02-19 DIAGNOSIS — Z5111 Encounter for antineoplastic chemotherapy: Secondary | ICD-10-CM

## 2013-02-19 DIAGNOSIS — C3430 Malignant neoplasm of lower lobe, unspecified bronchus or lung: Secondary | ICD-10-CM

## 2013-02-19 DIAGNOSIS — C342 Malignant neoplasm of middle lobe, bronchus or lung: Secondary | ICD-10-CM

## 2013-02-19 DIAGNOSIS — E279 Disorder of adrenal gland, unspecified: Secondary | ICD-10-CM

## 2013-02-19 LAB — COMPREHENSIVE METABOLIC PANEL (CC13)
ALT: 18 U/L (ref 0–55)
AST: 18 U/L (ref 5–34)
Albumin: 3 g/dL — ABNORMAL LOW (ref 3.5–5.0)
Alkaline Phosphatase: 51 U/L (ref 40–150)
BUN: 12.7 mg/dL (ref 7.0–26.0)
Creatinine: 0.8 mg/dL (ref 0.6–1.1)
Potassium: 4.2 mEq/L (ref 3.5–5.1)

## 2013-02-19 LAB — CBC WITH DIFFERENTIAL/PLATELET
BASO%: 0.2 % (ref 0.0–2.0)
EOS%: 0.4 % (ref 0.0–7.0)
HCT: 25.7 % — ABNORMAL LOW (ref 34.8–46.6)
MCH: 32.1 pg (ref 25.1–34.0)
MCHC: 31.5 g/dL (ref 31.5–36.0)
NEUT%: 70.9 % (ref 38.4–76.8)
RBC: 2.52 10*6/uL — ABNORMAL LOW (ref 3.70–5.45)
WBC: 8.1 10*3/uL (ref 3.9–10.3)
lymph#: 1.2 10*3/uL (ref 0.9–3.3)
nRBC: 1 % — ABNORMAL HIGH (ref 0–0)

## 2013-02-19 MED ORDER — ONDANSETRON 16 MG/50ML IVPB (CHCC)
16.0000 mg | Freq: Once | INTRAVENOUS | Status: AC
  Administered 2013-02-19: 16 mg via INTRAVENOUS

## 2013-02-19 MED ORDER — DEXAMETHASONE SODIUM PHOSPHATE 20 MG/5ML IJ SOLN
20.0000 mg | Freq: Once | INTRAMUSCULAR | Status: AC
  Administered 2013-02-19: 20 mg via INTRAVENOUS

## 2013-02-19 MED ORDER — SODIUM CHLORIDE 0.9 % IV SOLN
Freq: Once | INTRAVENOUS | Status: AC
  Administered 2013-02-19: 10:00:00 via INTRAVENOUS

## 2013-02-19 MED ORDER — SODIUM CHLORIDE 0.9 % IV SOLN
500.0000 mg/m2 | Freq: Once | INTRAVENOUS | Status: AC
  Administered 2013-02-19: 925 mg via INTRAVENOUS
  Filled 2013-02-19: qty 37

## 2013-02-19 MED ORDER — SODIUM CHLORIDE 0.9 % IV SOLN
490.0000 mg | Freq: Once | INTRAVENOUS | Status: AC
  Administered 2013-02-19: 490 mg via INTRAVENOUS
  Filled 2013-02-19: qty 49

## 2013-02-19 MED ORDER — CARBOPLATIN CHEMO INJECTION 600 MG/60ML: 636.5000 mg | Freq: Once | INTRAVENOUS | Status: DC

## 2013-02-19 NOTE — Progress Notes (Signed)
[facility] Telephone:(336) [phone]   Fax:(336) [phone]  OFFICE PROGRESS NOTE  Ernestine Conrad, MD [facility] [address] [place] [address]  DIAGNOSIS: Metastatic non-small cell lung cancer initially diagnosed as synchronous primary non-small cell lung cancer, adenocarcinoma with negative EGFR mutation and negative ALK gene translocation involving the right middle lobe and lower lobe diagnosed in July of 2013.   PRIOR THERAPY:  1) Stereotactic radiotherapy to the right lung lesions under the care of Dr. Mitzi Hansen completed on 07/09/2012.  2) whole brain irradiation under the care of Dr. Mitzi Hansen completed on 12/04/2012.  CURRENT THERAPY: Systemic chemotherapy with carboplatin for AUC of 5 and Alimta 500 mg/M2 every 3 weeks. Status post 3 cycles  INTERVAL HISTORY: [name] 62 y.o. female returns to the clinic today for followup visit accompanied by her caregiver. The patient denied having any specific complaints today except for mild fatigue. She is tolerating her systemic chemotherapy with carboplatin and Alimta fairly well. She denied having any significant fever or chills. She has no nausea or vomiting. She has repeat CT scan of the chest, abdomen and pelvis performed recently and she is here for evaluation and discussion of her scan results.  MEDICAL HISTORY: Past Medical History  Diagnosis Date  . Atrial fibrillation   . Coronary atherosclerosis of native coronary artery     Nonobstructive, LVEF 60%  . COPD (chronic obstructive pulmonary disease)   . Mixed hyperlipidemia   . Essential hypertension, benign   . Tachycardia-bradycardia syndrome   . Embolism - blood clot     PE  . SOB (shortness of breath) on exertion   . Allergy     Latex  . Asthma   . Anxiety   . Myocardial infarction     12 years ago ?  [name] History of radiation therapy     59 gy fraction 5 eot 07/09/12 right lung  . Lung cancer 04/29/12 dx    RLL needle bx=invasive well  diff adenocarcinoma  [facility]cca  . 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  MEDICATIONS:  Current Outpatient Prescriptions  Medication Sig Dispense Refill  . albuterol (PROAIR HFA) 108 (90 BASE) MCG/ACT inhaler Inhale 2 puffs into the lungs every 6 (six) hours as needed for wheezing or shortness of breath.  1 Inhaler  1  . albuterol (PROVENTIL) (2.5 MG/3ML) 0.083% nebulizer solution Take 3 mLs (2.5 mg total) by nebulization every 6 (six) hours as needed for wheezing or shortness of breath.  75 mL  1  . aspirin EC 81 MG tablet Take 1 tablet (81 mg total) by mouth daily. Restart this medicine on 5/14  30 tablet  0  . flecainide (TAMBOCOR) 100 MG tablet Take 1 tablet (100 mg total) by mouth 2 (two) times daily.  60 tablet  5  . folic acid (FOLVITE) 1 MG tablet Take 1 tablet (1 mg total) by mouth daily.  30 tablet  2  . glucose 4 GM chewable tablet Chew 4 tablets (16 g total) by mouth as needed for low blood sugar.  50 tablet  12  . hyoscyamine (LEVSIN SL) 0.125 MG SL tablet Take 1 tablet (0.125 mg total) by mouth every 4 (four) hours as needed for cramping.  30 tablet  0  . ipratropium (ATROVENT) 0.02 % nebulizer solution Take 2.5 mLs (500 mcg total) by nebulization 4 (four) times daily as needed for wheezing (  shortness of breath).  75 mL  1  . loratadine (CLARITIN) 10 MG tablet Take 1 tablet (10 mg total) by mouth daily.  30 tablet  0  . meclizine (ANTIVERT) 25 MG tablet Take 1 tablet (25 mg total) by mouth 3 (three) times daily as needed.  30 tablet  0  . MELATONIN ER PO Take 5 mg by mouth at bedtime.      . nebivolol (BYSTOLIC) 2.5 MG tablet Take 1 tablet (2.5 mg total) by mouth daily.  30 tablet  0  . oxyCODONE-acetaminophen (PERCOCET) 10-325 MG per tablet Take 1 tablet by mouth every 8 (eight) hours as needed for pain. Takes once daily  And prn for knees  30 tablet  0  . pantoprazole (PROTONIX) 40 MG tablet Take 1 tablet (40 mg total) by mouth  daily. 40 mg po BID x 14 days, then daily continuous.  30 tablet  2  . potassium chloride SA (KLOR-CON M20) 20 MEQ tablet Take 1 tablet (20 mEq total) by mouth daily.  30 tablet  3  . rosuvastatin (CRESTOR) 40 MG tablet Take 1 tablet (40 mg total) by mouth daily.  30 tablet  11   No current facility-administered medications for this visit.    SURGICAL HISTORY:  Past Surgical History  Procedure Laterality Date  . Appendectomy    . Insert / replace / remove pacemaker  3/11     [employer] - Dr. [name]  . Video bronchoscopy  02/22/2012    Procedure: VIDEO BRONCHOSCOPY WITH FLUORO;  Surgeon: Nyoka Cowden, MD;  Location: [facility];  Service: Cardiopulmonary;  Laterality: Bilateral;  . Lung biopsy  04/29/12    RLL invasive well diff adenocarcinoma  . Lung biopsy  02/22/12    RML=benign lung parenchyma,no tumor sen  . Tonsillectomy and adenoidectomy      age 69  . Total abdominal hysterectomy  1975    1/2 right ovary intact, left salpingo-oppherctomy  . Mr brain w wo contrast  11/17/2012       . Esophagogastroduodenoscopy N/A 01/15/2013    Procedure: ESOPHAGOGASTRODUODENOSCOPY (EGD);  Surgeon: Corbin Ade, MD;  [facility];  Service: Endoscopy;  Laterality: N/A;    REVIEW OF SYSTEMS:  A comprehensive review of systems was negative except for: Constitutional: positive for fatigue   PHYSICAL EXAMINATION: General appearance: alert, cooperative, fatigued and no distress Head: Normocephalic, without obvious abnormality, atraumatic Neck: no adenopathy Lymph nodes: Cervical, supraclavicular, and axillary nodes normal. Resp: clear to auscultation bilaterally Cardio: regular rate and rhythm, S1, S2 normal, no murmur, click, rub or gallop GI: soft, non-tender; bowel sounds normal; no masses,  no organomegaly Extremities: extremities normal, atraumatic, no cyanosis or edema Neurologic: Alert and oriented X 3, normal strength and tone. Normal symmetric reflexes. Normal coordination and  gait  ECOG PERFORMANCE STATUS: 1 - Symptomatic but completely ambulatory  Blood pressure 156/83, pulse 94, temperature 97 F (36.1 C), temperature source Oral, resp. rate 17, height 5\' 2"  (1.575 m), weight 154 lb 12.8 oz (70.217 kg).  LABORATORY DATA: Lab Results  Component Value Date   WBC 8.1 02/19/2013   HGB 8.1* 02/19/2013   HCT 25.7* 02/19/2013   MCV 102.0* 02/19/2013   PLT 144* 02/19/2013      Chemistry      Component Value Date/Time   NA 139 01/29/2013 1237   NA 142 01/19/2013 0412   K 4.0 01/29/2013 1237   K 3.1* 01/19/2013 0412   CL 104 01/29/2013 1237  CL 108 01/19/2013 0412   CO2 24 01/29/2013 1237   CO2 27 01/19/2013 0412   BUN 16.3 01/29/2013 1237   BUN 16 01/19/2013 0412   CREATININE 0.7 01/29/2013 1237   CREATININE 0.57 01/19/2013 0412   CREATININE 0.80 09/24/2011 1058   GLU Above linear limit,repeat venous draw for confirmation 12/18/2012 1643      Component Value Date/Time   CALCIUM 9.3 01/29/2013 1237   CALCIUM 8.0* 01/19/2013 0412   ALKPHOS 80 01/29/2013 1237   ALKPHOS 62 01/16/2013 0434   AST 26 01/29/2013 1237   AST 20 01/16/2013 0434   ALT 55 01/29/2013 1237   ALT 36* 01/16/2013 0434   BILITOT 1.09 01/29/2013 1237   BILITOT 0.3 01/16/2013 0434       RADIOGRAPHIC STUDIES: Ct Chest W Contrast  02/16/2013   *RADIOLOGY REPORT*  Clinical Data:  Restaging metastatic non-small cell lung cancer  CT CHEST, ABDOMEN AND PELVIS WITH CONTRAST  Technique:  Multidetector CT imaging of the chest, abdomen and pelvis was performed following the standard protocol during bolus administration of intravenous contrast.  Contrast: OMNIPAQUE IOHEXOL 300 MG/ML  SOLN  Comparison:  CT chest 10/22/2012, PET CT  05/21/2012   CT CHEST  Findings: Lungs/pleura: No pleural effusion identified.  Mild changes of centrilobular emphysema identified.  Right middle lobe lung mass appears less solid and smaller on today's study.  This currently measures 1.7 x 1.3 cm, image 36/series 5.  Previously 2.2 x  2.0 cm, image 36/series 5.  Right lower lobe lesion measures 0.8 x 0.9 cm, image number 39/series 5.  Previously 1.1 x 0.8 cm. Calcified granuloma noted in the superior segment of the right lower lobe.  Heart/Mediastinum: No mediastinal or hilar adenopathy.  The heart size appears normal.  No pericardial effusion.  There is a left chest wall pacer device with leads in the right atrial appendage and right ventricle.  Bones/Musculoskeletal:  Discogenic sclerosis is again noted involving the lower thoracic spine.  This appears similar to previous examinations.  IMPRESSION:  1.  Pulmonary nodules in the right lung have decreased in size from previous exam. 2.  No new sites of disease identified within the chest.   CT ABDOMEN AND PELVIS  Findings:  Stable low attenuation structures within the liver parenchyma.  The gallbladder appears normal.  No biliary dilatation.  Normal appearance of the pancreas.  Stable 1 cm of low attenuation splenic lesion.  The right adrenal gland is normal.  Metastasis to the left adrenal gland measures 3.2 x 4.9 cm, image 53/series 2.  The left kidney is normal.  Cyst arising from the inferior pole of the right kidney measures 1.6 cm and 24 HU.  This is unchanged from previous exam.  The urinary bladder appears normal.  Previous hysterectomy.  Calcified atherosclerotic change involves the abdominal aorta. There is no aneurysm.  No upper abdominal or pelvic adenopathy identified.  The stomach appears normal.  The small bowel loops have a normal course and caliber without evidence for obstruction.  Normal appearance of the proximal colon.  Distal colonic diverticula identified.  No acute diverticulitis.  Review of the visualized osseous structures is significant for lumbar spondylosis.  A first-degree anterolisthesis of L4 on L5 measures 6 mm.  IMPRESSION:  1.  Increase in size of left adrenal gland metastases. 2.  Intermediate attenuating cyst arising from the inferior pole of the right kidney  is unchanged from previous exam.   Original Report Authenticated By: Signa Kell, M.D.  ASSESSMENT: This is a very pleasant 62 years old white female with metastatic non-small cell lung cancer currently undergoing systemic chemotherapy with carboplatin and Alimta with mixed response including decrease in the size ofn the pulmonary nodules but increase in the size of the left adrenal gland mass.   PLAN: I dosimetry review the images and discussed the scan results with the patient today. I recommended for her to continue treatment was carboplatin and Alimta for now for the next 3 cycles.  I would refer the patient to Dr. Mitzi Hansen for consideration of palliative radiotherapy to the enlarging left adrenal gland. I discussed the case with Dr. Mitzi Hansen and he will arrange an appointment for the patient to be seen soon for consideration of palliative radiotherapy to this area.  The patient would come back for followup visit in 3 weeks with the next cycle of her chemotherapy. She was advised to call immediately she has any concerning symptoms in the interval.  All questions were answered. The patient knows to call the clinic with any problems, questions or concerns. We can certainly see the patient much sooner if necessary.

## 2013-02-19 NOTE — Patient Instructions (Signed)
Continue chemotherapy as scheduled.  Followup visit in 3 weeks. 

## 2013-02-19 NOTE — Telephone Encounter (Signed)
Per staff phone call and POF I have schedueld appts.  JMW  

## 2013-02-19 NOTE — Patient Instructions (Addendum)
[facility] Discharge Instructions for Patients Receiving Chemotherapy  Today you received the following chemotherapy agents Alimta/Carboplatin.  To help prevent nausea and vomiting after your treatment, we encourage you to take your nausea medication as prescribed.   If you develop nausea and vomiting that is not controlled by your nausea medication, call the clinic. If it is after clinic hours your family physician or the after hours number for the clinic or go to the Emergency Department.   BELOW ARE SYMPTOMS THAT SHOULD BE REPORTED IMMEDIATELY:  *FEVER GREATER THAN 100.5 F  *CHILLS WITH OR WITHOUT FEVER  NAUSEA AND VOMITING THAT IS NOT CONTROLLED WITH YOUR NAUSEA MEDICATION  *UNUSUAL SHORTNESS OF BREATH  *UNUSUAL BRUISING OR BLEEDING  TENDERNESS IN MOUTH AND THROAT WITH OR WITHOUT PRESENCE OF ULCERS  *URINARY PROBLEMS  *BOWEL PROBLEMS  UNUSUAL RASH Items with * indicate a potential emergency and should be followed up as soon as possible.  Feel free to call the clinic you have any questions or concerns. The clinic phone number is [phone].   I have been informed and understand all the instructions given to me. I know to contact the clinic, my physician, or go to the Emergency Department if any problems should occur. I do not have any questions at this time, but understand that I may call the clinic during office hours   should I have any questions or need assistance in obtaining follow up care.    __________________________________________  _____________  __________ Signature of Patient or Authorized Representative            Date                   Time    __________________________________________ Nurse's Signature    

## 2013-02-20 ENCOUNTER — Encounter: Payer: Self-pay | Admitting: Oncology

## 2013-02-20 NOTE — Progress Notes (Addendum)
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  Bowel/Bladder complaints, if any: no  Nausea/Vomiting, if any: no  Pain issues, if any:  no  SAFETY ISSUES:  Prior radiation? Whole brain radiotherapy 30 gray in 10 fractions on 11/20/2012-12/04/2012, right lung 60 gray in 5 fractions on 06/30/2012-07/09/2012  Pacemaker/ICD? yes  Possible current pregnancy? no  Is the patient on methotrexate? no  Current Complaints / other details:  Ms. Weatherly here in a wheelchair accompanied by an aide from [facility] for re consult for a left adrenal gland metastases.  She denies pain and nausea.  She states she does have a poor appetite.  She does have a cough and is bringing up clear sputum.  She denies fatigue.

## 2013-02-23 ENCOUNTER — Ambulatory Visit
Admission: RE | Admit: 2013-02-23 | Discharge: 2013-02-23 | Disposition: A | Discharge status: Home / Self Care | Payer: Medicare Other | Source: Ambulatory Visit | Attending: Radiation Oncology | Admitting: Radiation Oncology

## 2013-02-23 VITALS — BP 116/64 | HR 95 | Temp 98.0°F | Ht 62.0 in | Wt 148.1 lb

## 2013-02-23 DIAGNOSIS — C3490 Malignant neoplasm of unspecified part of unspecified bronchus or lung: Secondary | ICD-10-CM | POA: Insufficient documentation

## 2013-02-23 DIAGNOSIS — C7972 Secondary malignant neoplasm of left adrenal gland: Secondary | ICD-10-CM

## 2013-02-23 DIAGNOSIS — M25569 Pain in unspecified knee: Secondary | ICD-10-CM | POA: Insufficient documentation

## 2013-02-23 DIAGNOSIS — Z510 Encounter for antineoplastic radiation therapy: Secondary | ICD-10-CM | POA: Insufficient documentation

## 2013-02-23 DIAGNOSIS — Z79899 Other long term (current) drug therapy: Secondary | ICD-10-CM | POA: Insufficient documentation

## 2013-02-23 DIAGNOSIS — C7970 Secondary malignant neoplasm of unspecified adrenal gland: Secondary | ICD-10-CM | POA: Insufficient documentation

## 2013-02-23 DIAGNOSIS — Z7982 Long term (current) use of aspirin: Secondary | ICD-10-CM | POA: Insufficient documentation

## 2013-02-23 DIAGNOSIS — R110 Nausea: Secondary | ICD-10-CM | POA: Insufficient documentation

## 2013-02-23 NOTE — Progress Notes (Signed)
Radiation Oncology         [phone] ________________________________  Name: [name] MRN: [id number]  Date: 02/23/2013  DOB: 09-14-1951  Re-evaluation Note  CC: Ernestine Conrad, MD  Si Gaul, MD  Diagnosis:   Metastatic non-small cell lung cancer  Interval Since Last Radiation:  3 months   Narrative:  The patient returns today for routine follow-up.  The patient indicates that she is doing fairly well. She is continuing with chemotherapy through Dr. Shirline Frees. This has gone relatively well in terms of her tolerating the treatment. She had restaging studies recently which showed a good response with the exception of a metastasis in the left adrenal gland. This now measures 3.2 x 4.9 cm and has been increasing. Dr. Shirline Frees therefore has asked me to see the patient today for consideration of radiotherapy to this lesion. She is continuing with carboplatin and Alimta chemotherapy.  She complains of some occasional abdominal pain. No severe ongoing issue in this regard. She denies any nausea. Her energy level has been relatively good.                              ALLERGIES:  is allergic to latex.  Meds: Current Outpatient Prescriptions  Medication Sig Dispense Refill  . albuterol (PROAIR HFA) 108 (90 BASE) MCG/ACT inhaler Inhale 2 puffs into the lungs every 6 (six) hours as needed for wheezing or shortness of breath.  1 Inhaler  1  . albuterol (PROVENTIL) (2.5 MG/3ML) 0.083% nebulizer solution Take 3 mLs (2.5 mg total) by nebulization every 6 (six) hours as needed for wheezing or shortness of breath.  75 mL  1  . aspirin EC 81 MG tablet Take 1 tablet (81 mg total) by mouth daily. Restart this medicine on 5/14  30 tablet  0  . flecainide (TAMBOCOR) 100 MG tablet Take 1 tablet (100 mg total) by mouth 2 (two) times daily.  60 tablet  5  . folic acid (FOLVITE) 1 MG tablet Take 1 tablet (1 mg total) by mouth daily.  30 tablet  2  . glucose 4 GM chewable tablet Chew 4 tablets (16 g  total) by mouth as needed for low blood sugar.  50 tablet  12  . hyoscyamine (LEVSIN SL) 0.125 MG SL tablet Take 1 tablet (0.125 mg total) by mouth every 4 (four) hours as needed for cramping.  30 tablet  0  . ipratropium (ATROVENT) 0.02 % nebulizer solution Take 2.5 mLs (500 mcg total) by nebulization 4 (four) times daily as needed for wheezing (shortness of breath).  75 mL  1  . loratadine (CLARITIN) 10 MG tablet Take 1 tablet (10 mg total) by mouth daily.  30 tablet  0  . meclizine (ANTIVERT) 25 MG tablet Take 1 tablet (25 mg total) by mouth 3 (three) times daily as needed.  30 tablet  0  . MELATONIN ER PO Take 5 mg by mouth at bedtime.      . nebivolol (BYSTOLIC) 2.5 MG tablet Take 1 tablet (2.5 mg total) by mouth daily.  30 tablet  0  . oxyCODONE-acetaminophen (PERCOCET) 10-325 MG per tablet Take 1 tablet by mouth every 8 (eight) hours as needed for pain. Takes once daily  And prn for knees  30 tablet  0  . pantoprazole (PROTONIX) 40 MG tablet Take 1 tablet (40 mg total) by mouth daily. 40 mg po BID x 14 days, then daily continuous.  30 tablet  2  . potassium chloride SA (KLOR-CON M20) 20 MEQ tablet Take 1 tablet (20 mEq total) by mouth daily.  30 tablet  3  . rosuvastatin (CRESTOR) 40 MG tablet Take 1 tablet (40 mg total) by mouth daily.  30 tablet  11   No current facility-administered medications for this encounter.    Physical Findings: The patient is in no acute distress. Patient is alert and oriented.  height is 5\' 2"  (1.575 m) and weight is 148 lb 1.6 oz (67.178 kg). Her temperature is 98 F (36.7 C). Her blood pressure is 116/64 and her pulse is 95. .   General: Well-developed, in no acute distress HEENT: Normocephalic, atraumatic Cardiovascular: Regular rate and rhythm Respiratory: Clear to auscultation bilaterally GI: Soft, nontender, normal bowel sounds Extremities: No edema present   Lab Findings: Lab Results  Component Value Date   WBC 8.1 02/19/2013   HGB 8.1*  02/19/2013   HCT 25.7* 02/19/2013   MCV 102.0* 02/19/2013   PLT 144* 02/19/2013     Radiographic Findings: Ct Chest W Contrast  02/16/2013   *RADIOLOGY REPORT*  Clinical Data:  Restaging metastatic non-small cell lung cancer  CT CHEST, ABDOMEN AND PELVIS WITH CONTRAST  Technique:  Multidetector CT imaging of the chest, abdomen and pelvis was performed following the standard protocol during bolus administration of intravenous contrast.  Contrast: OMNIPAQUE IOHEXOL 300 MG/ML  SOLN  Comparison:  CT chest 10/22/2012, PET CT  05/21/2012  CT CHEST  Findings: Lungs/pleura: No pleural effusion identified.  Mild changes of centrilobular emphysema identified.  Right middle lobe lung mass appears less solid and smaller on today's study.  This currently measures 1.7 x 1.3 cm, image 36/series 5.  Previously 2.2 x 2.0 cm, image 36/series 5.  Right lower lobe lesion measures 0.8 x 0.9 cm, image number 39/series 5.  Previously 1.1 x 0.8 cm. Calcified granuloma noted in the superior segment of the right lower lobe.  Heart/Mediastinum: No mediastinal or hilar adenopathy.  The heart size appears normal.  No pericardial effusion.  There is a left chest wall pacer device with leads in the right atrial appendage and right ventricle.  Bones/Musculoskeletal:  Discogenic sclerosis is again noted involving the lower thoracic spine.  This appears similar to previous examinations.  IMPRESSION:  1.  Pulmonary nodules in the right lung have decreased in size from previous exam. 2.  No new sites of disease identified within the chest.  CT ABDOMEN AND PELVIS  Findings:  Stable low attenuation structures within the liver parenchyma.  The gallbladder appears normal.  No biliary dilatation.  Normal appearance of the pancreas.  Stable 1 cm of low attenuation splenic lesion.  The right adrenal gland is normal.  Metastasis to the left adrenal gland measures 3.2 x 4.9 cm, image 53/series 2.  The left kidney is normal.  Cyst arising from the  inferior pole of the right kidney measures 1.6 cm and 24 HU.  This is unchanged from previous exam.  The urinary bladder appears normal.  Previous hysterectomy.  Calcified atherosclerotic change involves the abdominal aorta. There is no aneurysm.  No upper abdominal or pelvic adenopathy identified.  The stomach appears normal.  The small bowel loops have a normal course and caliber without evidence for obstruction.  Normal appearance of the proximal colon.  Distal colonic diverticula identified.  No acute diverticulitis.  Review of the visualized osseous structures is significant for lumbar spondylosis.  A first-degree anterolisthesis of L4 on L5 measures 6 mm.  IMPRESSION:  1.  Increase in size of left adrenal gland metastases. 2.  Intermediate attenuating cyst arising from the inferior pole of the right kidney is unchanged from previous exam.   Original Report Authenticated By: Signa Kell, M.D.   Ct Abdomen Pelvis W Contrast  02/16/2013   *RADIOLOGY REPORT*  Clinical Data:  Restaging metastatic non-small cell lung cancer  CT CHEST, ABDOMEN AND PELVIS WITH CONTRAST  Technique:  Multidetector CT imaging of the chest, abdomen and pelvis was performed following the standard protocol during bolus administration of intravenous contrast.  Contrast: OMNIPAQUE IOHEXOL 300 MG/ML  SOLN  Comparison:  CT chest 10/22/2012, PET CT  05/21/2012  CT CHEST  Findings: Lungs/pleura: No pleural effusion identified.  Mild changes of centrilobular emphysema identified.  Right middle lobe lung mass appears less solid and smaller on today's study.  This currently measures 1.7 x 1.3 cm, image 36/series 5.  Previously 2.2 x 2.0 cm, image 36/series 5.  Right lower lobe lesion measures 0.8 x 0.9 cm, image number 39/series 5.  Previously 1.1 x 0.8 cm. Calcified granuloma noted in the superior segment of the right lower lobe.  Heart/Mediastinum: No mediastinal or hilar adenopathy.  The heart size appears normal.  No pericardial  effusion.  There is a left chest wall pacer device with leads in the right atrial appendage and right ventricle.  Bones/Musculoskeletal:  Discogenic sclerosis is again noted involving the lower thoracic spine.  This appears similar to previous examinations.  IMPRESSION:  1.  Pulmonary nodules in the right lung have decreased in size from previous exam. 2.  No new sites of disease identified within the chest.  CT ABDOMEN AND PELVIS  Findings:  Stable low attenuation structures within the liver parenchyma.  The gallbladder appears normal.  No biliary dilatation.  Normal appearance of the pancreas.  Stable 1 cm of low attenuation splenic lesion.  The right adrenal gland is normal.  Metastasis to the left adrenal gland measures 3.2 x 4.9 cm, image 53/series 2.  The left kidney is normal.  Cyst arising from the inferior pole of the right kidney measures 1.6 cm and 24 HU.  This is unchanged from previous exam.  The urinary bladder appears normal.  Previous hysterectomy.  Calcified atherosclerotic change involves the abdominal aorta. There is no aneurysm.  No upper abdominal or pelvic adenopathy identified.  The stomach appears normal.  The small bowel loops have a normal course and caliber without evidence for obstruction.  Normal appearance of the proximal colon.  Distal colonic diverticula identified.  No acute diverticulitis.  Review of the visualized osseous structures is significant for lumbar spondylosis.  A first-degree anterolisthesis of L4 on L5 measures 6 mm.  IMPRESSION:  1.  Increase in size of left adrenal gland metastases. 2.  Intermediate attenuating cyst arising from the inferior pole of the right kidney is unchanged from previous exam.   Original Report Authenticated By: Signa Kell, M.D.    Impression:    The patient has metastatic non-small cell lung cancer. She has completed stereotactic body radiotherapy to the right lung as well as whole brain radiotherapy more recently which was completed in  February. She has an enlarging left adrenal gland metastasis. Otherwise no sign of progressive disease on imaging. I believe that she is a good candidate for palliative radiotherapy to the left adrenal gland, and I discussed with her a possible three-week course of treatment. We discussed possible side effects and risks of treatment as well as the potential  benefit of shrinking this tumor. All of her questions were answered. She does wish to proceed with this treatment.  Plan:  Simulation today such that we can proceed with treatment planning.    Radene Gunning, M.D., P.h.D.

## 2013-02-23 NOTE — Progress Notes (Signed)
Please see the Nurse Progress Note in the MD Initial Consult Encounter for this patient. 

## 2013-02-26 ENCOUNTER — Telehealth: Payer: Self-pay | Admitting: Internal Medicine

## 2013-02-26 ENCOUNTER — Other Ambulatory Visit: Payer: Self-pay | Admitting: Radiation Oncology

## 2013-02-26 ENCOUNTER — Other Ambulatory Visit: Payer: Medicare Other

## 2013-02-26 ENCOUNTER — Other Ambulatory Visit: Payer: Self-pay | Admitting: *Deleted

## 2013-02-26 ENCOUNTER — Ambulatory Visit: Payer: Medicare Other | Admitting: Radiation Oncology

## 2013-02-26 ENCOUNTER — Other Ambulatory Visit: Payer: Medicare Other | Admitting: Lab

## 2013-02-26 ENCOUNTER — Telehealth: Payer: Self-pay

## 2013-02-26 DIAGNOSIS — C3490 Malignant neoplasm of unspecified part of unspecified bronchus or lung: Secondary | ICD-10-CM

## 2013-02-26 NOTE — Telephone Encounter (Signed)
Spoke with caretaker/transportation that patient attempted to have labs drawn at [facility] today but it wasn't done as there was no order to have done at their faciilty.Spoke with Judeth Cornfield RN for Dr. Arbutus Ped to arrange for orders and lab draws to be done at [facility].She will have schedulers to arrange.

## 2013-02-26 NOTE — Telephone Encounter (Signed)
, °

## 2013-02-26 NOTE — Telephone Encounter (Signed)
Talked to pt;s [facility] gave them appt for 02/27/13 @[facility]  and every thurday lab draw @ [facility] per pt rqst

## 2013-02-26 NOTE — Progress Notes (Signed)
Pt called requesting for weekly labs to be done at [facility].  Orders placed into the computer.  Informed our scheduling dept to arrange for her to get weekly lab work at [facility].  SLJ

## 2013-02-27 ENCOUNTER — Encounter (HOSPITAL_COMMUNITY): Payer: Self-pay | Admitting: Dietician

## 2013-02-27 ENCOUNTER — Other Ambulatory Visit (HOSPITAL_COMMUNITY): Payer: Medicare Other

## 2013-02-27 NOTE — Progress Notes (Signed)
Nutrition Note  Pt identified for weight loss on [facility] Nutrition Screen.   Wt Readings from Last 10 Encounters:  02/23/13 148 lb 1.6 oz (67.178 kg)  02/19/13 154 lb 12.8 oz (70.217 kg)  01/29/13 155 lb 12.8 oz (70.67 kg)  01/20/13 169 lb 11.2 oz (76.975 kg)  01/20/13 169 lb 11.2 oz (76.975 kg)  01/08/13 158 lb 8 oz (71.895 kg)  12/21/12 170 lb (77.111 kg)  12/11/12 173 lb 11.2 oz (78.79 kg)  12/04/12 166 lb 8 oz (75.524 kg)  12/04/12 166 lb 8 oz (75.524 kg)   Chart reviewed. Pt is followed by [facility] and [facility] for metastatic lung cancer. Recent PET scan showed mets to adrenal gland. Pt currently on chemotherapy and may start radiotherapy as well for mets to adrenal gland.  Pt with variable appetite during hospitalizations per chart review. Wt hx reveals UBW of 190-195#. Wt hx also reveals 52# (26.5%) wt loss x 1 year, 32# (17.8%) x 6 months, 23# (13.5%) x 3 months, and 7$ (4.5%) wt loss x 1 month. Wt loss at 1 year, 6 months, and 3 months is clinically significant.  Pt resides at ALF facility [facility] through [facility]). Will defer nutritional management to RD at ALF facility. Noted nutritional supplement was not listed on discharge orders. Recommend nutritional supplement for pt to increase PO intake and slow wt loss.   Melody Haver, RD, LDN Pager: [phone]

## 2013-03-03 ENCOUNTER — Telehealth: Payer: Self-pay | Admitting: Oncology

## 2013-03-03 ENCOUNTER — Other Ambulatory Visit (HOSPITAL_COMMUNITY): Payer: Medicare Other

## 2013-03-03 ENCOUNTER — Ambulatory Visit: Admission: RE | Admit: 2013-03-03 | Payer: Medicare Other | Source: Ambulatory Visit | Admitting: Radiation Oncology

## 2013-03-03 ENCOUNTER — Ambulatory Visit
Admission: RE | Admit: 2013-03-03 | Discharge: 2013-03-03 | Disposition: A | Discharge status: Home / Self Care | Payer: Medicare Other | Source: Ambulatory Visit | Attending: Radiation Oncology | Admitting: Radiation Oncology

## 2013-03-03 DIAGNOSIS — C7931 Secondary malignant neoplasm of brain: Secondary | ICD-10-CM

## 2013-03-03 DIAGNOSIS — C7949 Secondary malignant neoplasm of other parts of nervous system: Secondary | ICD-10-CM

## 2013-03-03 DIAGNOSIS — D61818 Other pancytopenia: Secondary | ICD-10-CM

## 2013-03-03 DIAGNOSIS — C801 Malignant (primary) neoplasm, unspecified: Secondary | ICD-10-CM

## 2013-03-03 NOTE — Telephone Encounter (Signed)
Talked to [place] at [place] and found out that [name] is an inpatient at [facility] in [place].  She was admitted with low potassium.  She is expected to be discharged tomorrow.

## 2013-03-04 ENCOUNTER — Ambulatory Visit: Payer: Medicare Other

## 2013-03-04 DIAGNOSIS — C3490 Malignant neoplasm of unspecified part of unspecified bronchus or lung: Secondary | ICD-10-CM

## 2013-03-04 DIAGNOSIS — D61818 Other pancytopenia: Secondary | ICD-10-CM

## 2013-03-05 ENCOUNTER — Ambulatory Visit: Payer: Medicare Other

## 2013-03-05 ENCOUNTER — Other Ambulatory Visit: Payer: Medicare Other

## 2013-03-05 ENCOUNTER — Telehealth: Payer: Self-pay | Admitting: *Deleted

## 2013-03-05 ENCOUNTER — Telehealth: Payer: Self-pay | Admitting: Oncology

## 2013-03-05 ENCOUNTER — Other Ambulatory Visit (HOSPITAL_COMMUNITY): Payer: Medicare Other

## 2013-03-05 NOTE — Telephone Encounter (Signed)
Talked to Dewayne Hatch at [facility] about [employer].  Ann stated that Seirra is still an inpatient at [facility].  She is expected to be discharged late this afternoon and she does not think Anjalina will make her radiation appointment today at 2:30.  She will call when Tyresa is discharged.

## 2013-03-05 NOTE — Telephone Encounter (Signed)
DR PARSON'S CALLED TO LET us KNOW HE ADJUSTED PT COUMADIN. 03/04/13 INR 1.6. NEW DOSE IS 5MG  M,W,F AND 2.5MG  ALL OTHER.

## 2013-03-06 ENCOUNTER — Ambulatory Visit: Payer: Medicare Other

## 2013-03-09 ENCOUNTER — Ambulatory Visit
Admission: RE | Admit: 2013-03-09 | Discharge: 2013-03-09 | Disposition: A | Discharge status: Home / Self Care | Payer: Medicare Other | Source: Ambulatory Visit | Attending: Radiation Oncology | Admitting: Radiation Oncology

## 2013-03-10 ENCOUNTER — Ambulatory Visit
Admission: RE | Admit: 2013-03-10 | Discharge: 2013-03-10 | Disposition: A | Discharge status: Home / Self Care | Payer: Medicare Other | Source: Ambulatory Visit | Attending: Radiation Oncology | Admitting: Radiation Oncology

## 2013-03-10 DIAGNOSIS — C7972 Secondary malignant neoplasm of left adrenal gland: Secondary | ICD-10-CM

## 2013-03-10 NOTE — Progress Notes (Signed)
Oriented patient to staff and routine of the clinic. Provided patient with RADIATION THERAPY AND YOU handbook then, reviewed pertinent information. Educated patient on potential side effects and management such as, fatigue, diarrhea, nausea, vomiting, skin changes and fatigue. All questions answered. Encouraged patient to contact staff with future needs. Patient verbalized understanding of all reviewed.

## 2013-03-11 ENCOUNTER — Ambulatory Visit: Payer: Medicare Other

## 2013-03-11 ENCOUNTER — Encounter: Payer: Self-pay | Admitting: Internal Medicine

## 2013-03-11 ENCOUNTER — Telehealth: Payer: Self-pay | Admitting: *Deleted

## 2013-03-11 ENCOUNTER — Other Ambulatory Visit (HOSPITAL_BASED_OUTPATIENT_CLINIC_OR_DEPARTMENT_OTHER): Payer: Medicare Other | Admitting: Lab

## 2013-03-11 ENCOUNTER — Ambulatory Visit
Admission: RE | Admit: 2013-03-11 | Discharge: 2013-03-11 | Disposition: A | Discharge status: Home / Self Care | Payer: Medicare Other | Source: Ambulatory Visit | Attending: Radiation Oncology | Admitting: Radiation Oncology

## 2013-03-11 ENCOUNTER — Ambulatory Visit (HOSPITAL_BASED_OUTPATIENT_CLINIC_OR_DEPARTMENT_OTHER): Payer: Medicare Other | Admitting: Internal Medicine

## 2013-03-11 ENCOUNTER — Telehealth: Payer: Self-pay | Admitting: Internal Medicine

## 2013-03-11 VITALS — BP 152/65 | HR 87 | Temp 97.8°F | Resp 17 | Ht 62.0 in | Wt 154.0 lb

## 2013-03-11 DIAGNOSIS — C3430 Malignant neoplasm of lower lobe, unspecified bronchus or lung: Secondary | ICD-10-CM

## 2013-03-11 DIAGNOSIS — D696 Thrombocytopenia, unspecified: Secondary | ICD-10-CM

## 2013-03-11 DIAGNOSIS — C342 Malignant neoplasm of middle lobe, bronchus or lung: Secondary | ICD-10-CM

## 2013-03-11 DIAGNOSIS — C3490 Malignant neoplasm of unspecified part of unspecified bronchus or lung: Secondary | ICD-10-CM

## 2013-03-11 DIAGNOSIS — C7931 Secondary malignant neoplasm of brain: Secondary | ICD-10-CM

## 2013-03-11 LAB — CBC WITH DIFFERENTIAL/PLATELET
Basophils Absolute: 0 10*3/uL (ref 0.0–0.1)
Eosinophils Absolute: 0.1 10*3/uL (ref 0.0–0.5)
HGB: 11.4 g/dL — ABNORMAL LOW (ref 11.6–15.9)
LYMPH%: 7.4 % — ABNORMAL LOW (ref 14.0–49.7)
MCV: 88.7 fL (ref 79.5–101.0)
MONO%: 18.6 % — ABNORMAL HIGH (ref 0.0–14.0)
NEUT#: 5.4 10*3/uL (ref 1.5–6.5)
Platelets: 74 10*3/uL — ABNORMAL LOW (ref 145–400)

## 2013-03-11 NOTE — Progress Notes (Signed)
[facility] Telephone:([phone]   Fax:(336) [phone]  OFFICE PROGRESS NOTE  Ernestine Conrad, MD [facility] [address] [place] [address]  DIAGNOSIS: Metastatic non-small cell lung cancer initially diagnosed as synchronous primary non-small cell lung cancer, adenocarcinoma with negative EGFR mutation and negative ALK gene translocation involving the right middle lobe and lower lobe diagnosed in July of 2013.   PRIOR THERAPY:  1) Stereotactic radiotherapy to the right lung lesions under the care of Dr. Mitzi Hansen completed on 07/09/2012.  2) whole brain irradiation under the care of Dr. Mitzi Hansen completed on 12/04/2012.   CURRENT THERAPY:  1) Systemic chemotherapy with carboplatin for AUC of 5 and Alimta 500 mg/M2 every 3 weeks. Status post 4 cycles. 2) palliative radiotherapy to the left adrenal gland under the care of Dr. Mitzi Hansen expected to be completed in 2 days.   INTERVAL HISTORY: [name] 62 y.o. female returns to the clinic today for followup visit. The patient continues to complain of increasing fatigue and weakness. She fell down few days ago at her best to but did not her to any part of her body. The patient continues to have shortness of breath at baseline and increased with exertion. She denied having any significant chest pain, cough or hemoptysis. She denied having any significant weight loss or night sweats. She tolerated the last cycle of her systemic chemotherapy with carboplatin and Alimta fairly well.  MEDICAL HISTORY: Past Medical History  Diagnosis Date  . Atrial fibrillation   . Coronary atherosclerosis of native coronary artery     Nonobstructive, LVEF 60%  . COPD (chronic obstructive pulmonary disease)   . Mixed hyperlipidemia   . Essential hypertension, benign   . Tachycardia-bradycardia syndrome   . Embolism - blood clot     PE  . SOB (shortness of breath) on exertion   . Allergy     Latex  . Asthma   . Anxiety   .  Myocardial infarction     12 years ago ?  [name] History of radiation therapy     59 gy fraction 5 eot 07/09/12 right lung  . Lung cancer 04/29/12 dx    RLL needle bx=invasive well diff adenocarcinoma  [facility]  . 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  MEDICATIONS:  Current Outpatient Prescriptions  Medication Sig Dispense Refill  . albuterol (PROAIR HFA) 108 (90 BASE) MCG/ACT inhaler Inhale 2 puffs into the lungs every 6 (six) hours as needed for wheezing or shortness of breath.  1 Inhaler  1  . albuterol (PROVENTIL) (2.5 MG/3ML) 0.083% nebulizer solution Take 3 mLs (2.5 mg total) by nebulization every 6 (six) hours as needed for wheezing or shortness of breath.  75 mL  1  . aspirin EC 81 MG tablet Take 1 tablet (81 mg total) by mouth daily. Restart this medicine on 5/14  30 tablet  0  . flecainide (TAMBOCOR) 100 MG tablet Take 1 tablet (100 mg total) by mouth 2 (two) times daily.  60 tablet  5  . folic acid (FOLVITE) 1 MG tablet Take 1 tablet (1 mg total) by mouth daily.  30 tablet  2  . glucose 4 GM chewable tablet Chew 4 tablets (16 g total) by mouth as needed for low blood sugar.  50 tablet  12  . hyoscyamine (LEVSIN SL) 0.125 MG SL tablet Take 1 tablet (0.125 mg total) by mouth  every 4 (four) hours as needed for cramping.  30 tablet  0  . ipratropium (ATROVENT) 0.02 % nebulizer solution Take 2.5 mLs (500 mcg total) by nebulization 4 (four) times daily as needed for wheezing (shortness of breath).  75 mL  1  . loratadine (CLARITIN) 10 MG tablet Take 1 tablet (10 mg total) by mouth daily.  30 tablet  0  . meclizine (ANTIVERT) 25 MG tablet Take 1 tablet (25 mg total) by mouth 3 (three) times daily as needed.  30 tablet  0  . MELATONIN ER PO Take 5 mg by mouth at bedtime.      . nebivolol (BYSTOLIC) 2.5 MG tablet Take 1 tablet (2.5 mg total) by mouth daily.  30 tablet  0  . oxyCODONE-acetaminophen (PERCOCET) 10-325 MG per tablet Take 1 tablet  by mouth every 8 (eight) hours as needed for pain. Takes once daily  And prn for knees  30 tablet  0  . pantoprazole (PROTONIX) 40 MG tablet Take 1 tablet (40 mg total) by mouth daily. 40 mg po BID x 14 days, then daily continuous.  30 tablet  2  . potassium chloride SA (KLOR-CON M20) 20 MEQ tablet Take 1 tablet (20 mEq total) by mouth daily.  30 tablet  3  . rosuvastatin (CRESTOR) 40 MG tablet Take 1 tablet (40 mg total) by mouth daily.  30 tablet  11   No current facility-administered medications for this visit.    SURGICAL HISTORY:  Past Surgical History  Procedure Laterality Date  . Appendectomy    . Insert / replace / remove pacemaker  3/11     [employer] - Dr. [name]  . Video bronchoscopy  02/22/2012    Procedure: VIDEO BRONCHOSCOPY WITH FLUORO;  Surgeon: Nyoka Cowden, MD;  Location: [facility];  Service: Cardiopulmonary;  Laterality: Bilateral;  . Lung biopsy  04/29/12    RLL invasive well diff adenocarcinoma  . Lung biopsy  02/22/12    RML=benign lung parenchyma,no tumor sen  . Tonsillectomy and adenoidectomy      age 51  . Total abdominal hysterectomy  1975    1/2 right ovary intact, left salpingo-oppherctomy  . Mr brain w wo contrast  11/17/2012       . Esophagogastroduodenoscopy N/A 01/15/2013    Procedure: ESOPHAGOGASTRODUODENOSCOPY (EGD);  Surgeon: Corbin Ade, MD;  Location: [facility];  Service: Endoscopy;  Laterality: N/A;    REVIEW OF SYSTEMS:  A comprehensive review of systems was negative except for: Constitutional: positive for anorexia and fatigue Respiratory: positive for dyspnea on exertion Musculoskeletal: positive for muscle weakness   PHYSICAL EXAMINATION: General appearance: alert, cooperative, fatigued and no distress Head: Normocephalic, without obvious abnormality, atraumatic Neck: no adenopathy Lymph nodes: Cervical, supraclavicular, and axillary nodes normal. Resp: clear to auscultation bilaterally Cardio: regular rate and rhythm, S1, S2  normal, no murmur, click, rub or gallop GI: soft, non-tender; bowel sounds normal; no masses,  no organomegaly Extremities: extremities normal, atraumatic, no cyanosis or edema  ECOG PERFORMANCE STATUS: 2 - Symptomatic, <50% confined to bed  Blood pressure 152/65, pulse 87, temperature 97.8 F (36.6 C), temperature source Oral, resp. rate 17, height 5\' 2"  (1.575 m), weight 154 lb (69.854 kg), SpO2 98.00%.  LABORATORY DATA: Lab Results  Component Value Date   WBC 7.4 03/11/2013   HGB 11.4* 03/11/2013   HCT 33.1* 03/11/2013   MCV 88.7 03/11/2013   PLT 74* 03/11/2013      Chemistry  Component Value Date/Time   NA 143 02/19/2013 0825   NA 142 01/19/2013 0412   K 4.2 02/19/2013 0825   K 3.1* 01/19/2013 0412   CL 109* 02/19/2013 0825   CL 108 01/19/2013 0412   CO2 23 02/19/2013 0825   CO2 27 01/19/2013 0412   BUN 12.7 02/19/2013 0825   BUN 16 01/19/2013 0412   CREATININE 0.8 02/19/2013 0825   CREATININE 0.57 01/19/2013 0412   CREATININE 0.80 09/24/2011 1058   GLU Above linear limit,repeat venous draw for confirmation 12/18/2012 1643      Component Value Date/Time   CALCIUM 9.0 02/19/2013 0825   CALCIUM 8.0* 01/19/2013 0412   ALKPHOS 51 02/19/2013 0825   ALKPHOS 62 01/16/2013 0434   AST 18 02/19/2013 0825   AST 20 01/16/2013 0434   ALT 18 02/19/2013 0825   ALT 36* 01/16/2013 0434   BILITOT 0.76 02/19/2013 0825   BILITOT 0.3 01/16/2013 0434       RADIOGRAPHIC STUDIES: Ct Chest W Contrast  02/16/2013   *RADIOLOGY REPORT*  Clinical Data:  Restaging metastatic non-small cell lung cancer  CT CHEST, ABDOMEN AND PELVIS WITH CONTRAST  Technique:  Multidetector CT imaging of the chest, abdomen and pelvis was performed following the standard protocol during bolus administration of intravenous contrast.  Contrast: OMNIPAQUE IOHEXOL 300 MG/ML  SOLN  Comparison:  CT chest 10/22/2012, PET CT  05/21/2012  CT CHEST  Findings: Lungs/pleura: No pleural effusion identified.  Mild changes of centrilobular  emphysema identified.  Right middle lobe lung mass appears less solid and smaller on today's study.  This currently measures 1.7 x 1.3 cm, image 36/series 5.  Previously 2.2 x 2.0 cm, image 36/series 5.  Right lower lobe lesion measures 0.8 x 0.9 cm, image number 39/series 5.  Previously 1.1 x 0.8 cm. Calcified granuloma noted in the superior segment of the right lower lobe.  Heart/Mediastinum: No mediastinal or hilar adenopathy.  The heart size appears normal.  No pericardial effusion.  There is a left chest wall pacer device with leads in the right atrial appendage and right ventricle.  Bones/Musculoskeletal:  Discogenic sclerosis is again noted involving the lower thoracic spine.  This appears similar to previous examinations.  IMPRESSION:  1.  Pulmonary nodules in the right lung have decreased in size from previous exam. 2.  No new sites of disease identified within the chest.  CT ABDOMEN AND PELVIS  Findings:  Stable low attenuation structures within the liver parenchyma.  The gallbladder appears normal.  No biliary dilatation.  Normal appearance of the pancreas.  Stable 1 cm of low attenuation splenic lesion.  The right adrenal gland is normal.  Metastasis to the left adrenal gland measures 3.2 x 4.9 cm, image 53/series 2.  The left kidney is normal.  Cyst arising from the inferior pole of the right kidney measures 1.6 cm and 24 HU.  This is unchanged from previous exam.  The urinary bladder appears normal.  Previous hysterectomy.  Calcified atherosclerotic change involves the abdominal aorta. There is no aneurysm.  No upper abdominal or pelvic adenopathy identified.  The stomach appears normal.  The small bowel loops have a normal course and caliber without evidence for obstruction.  Normal appearance of the proximal colon.  Distal colonic diverticula identified.  No acute diverticulitis.  Review of the visualized osseous structures is significant for lumbar spondylosis.  A first-degree anterolisthesis of L4  on L5 measures 6 mm.  IMPRESSION:  1.  Increase in size of left  adrenal gland metastases. 2.  Intermediate attenuating cyst arising from the inferior pole of the right kidney is unchanged from previous exam.   Original Report Authenticated By: Signa Kell, M.D.   Ct Abdomen Pelvis W Contrast  02/16/2013   *RADIOLOGY REPORT*  Clinical Data:  Restaging metastatic non-small cell lung cancer  CT CHEST, ABDOMEN AND PELVIS WITH CONTRAST  Technique:  Multidetector CT imaging of the chest, abdomen and pelvis was performed following the standard protocol during bolus administration of intravenous contrast.  Contrast: OMNIPAQUE IOHEXOL 300 MG/ML  SOLN  Comparison:  CT chest 10/22/2012, PET CT  05/21/2012  CT CHEST  Findings: Lungs/pleura: No pleural effusion identified.  Mild changes of centrilobular emphysema identified.  Right middle lobe lung mass appears less solid and smaller on today's study.  This currently measures 1.7 x 1.3 cm, image 36/series 5.  Previously 2.2 x 2.0 cm, image 36/series 5.  Right lower lobe lesion measures 0.8 x 0.9 cm, image number 39/series 5.  Previously 1.1 x 0.8 cm. Calcified granuloma noted in the superior segment of the right lower lobe.  Heart/Mediastinum: No mediastinal or hilar adenopathy.  The heart size appears normal.  No pericardial effusion.  There is a left chest wall pacer device with leads in the right atrial appendage and right ventricle.  Bones/Musculoskeletal:  Discogenic sclerosis is again noted involving the lower thoracic spine.  This appears similar to previous examinations.  IMPRESSION:  1.  Pulmonary nodules in the right lung have decreased in size from previous exam. 2.  No new sites of disease identified within the chest.  CT ABDOMEN AND PELVIS  Findings:  Stable low attenuation structures within the liver parenchyma.  The gallbladder appears normal.  No biliary dilatation.  Normal appearance of the pancreas.  Stable 1 cm of low attenuation splenic lesion.   The right adrenal gland is normal.  Metastasis to the left adrenal gland measures 3.2 x 4.9 cm, image 53/series 2.  The left kidney is normal.  Cyst arising from the inferior pole of the right kidney measures 1.6 cm and 24 HU.  This is unchanged from previous exam.  The urinary bladder appears normal.  Previous hysterectomy.  Calcified atherosclerotic change involves the abdominal aorta. There is no aneurysm.  No upper abdominal or pelvic adenopathy identified.  The stomach appears normal.  The small bowel loops have a normal course and caliber without evidence for obstruction.  Normal appearance of the proximal colon.  Distal colonic diverticula identified.  No acute diverticulitis.  Review of the visualized osseous structures is significant for lumbar spondylosis.  A first-degree anterolisthesis of L4 on L5 measures 6 mm.  IMPRESSION:  1.  Increase in size of left adrenal gland metastases. 2.  Intermediate attenuating cyst arising from the inferior pole of the right kidney is unchanged from previous exam.   Original Report Authenticated By: Signa Kell, M.D.    ASSESSMENT: this is a very pleasant 62 years old white female with metastatic non-small cell lung cancer, adenocarcinoma.   PLAN: the patient is currently on systemic chemotherapy with carboplatin and Alimta status post 4 cycles. I will delay the start of cycle #5 by 1 week because of her persistent thrombocytopenia. The patient will complete the course of palliative radiotherapy to the left adrenal gland under the care of Dr. Mitzi Hansen is scheduled. She would come back for follow up visit in 4 weeks with the start of cycle #6. She was advised to call me immediately if she has any  concerning symptoms in the interval.  All questions were answered. The patient knows to call the clinic with any problems, questions or concerns. We can certainly see the patient much sooner if necessary.

## 2013-03-11 NOTE — Telephone Encounter (Signed)
gv and printed appt sched and avs for pt....MW added tx   °

## 2013-03-11 NOTE — Telephone Encounter (Signed)
Per staff message and POF I have scheduled appts.  JMW  

## 2013-03-11 NOTE — Patient Instructions (Signed)
We will delay the start of cycle #5 by 1 week. followup visit in 4 weeks

## 2013-03-11 NOTE — Telephone Encounter (Signed)
Progress note from Dr Doyne Keel at [facility] given to Dr Donnald Garre to review.  SLJ

## 2013-03-12 ENCOUNTER — Ambulatory Visit
Admission: RE | Admit: 2013-03-12 | Discharge: 2013-03-12 | Disposition: A | Discharge status: Home / Self Care | Payer: Medicare Other | Source: Ambulatory Visit | Attending: Radiation Oncology | Admitting: Radiation Oncology

## 2013-03-12 ENCOUNTER — Other Ambulatory Visit: Payer: Medicare Other | Admitting: Lab

## 2013-03-12 VITALS — BP 139/82 | HR 72 | Temp 99.1°F | Ht 62.0 in | Wt 148.3 lb

## 2013-03-12 DIAGNOSIS — C7972 Secondary malignant neoplasm of left adrenal gland: Secondary | ICD-10-CM

## 2013-03-12 MED ORDER — RADIAPLEXRX EX GEL
Freq: Once | CUTANEOUS | Status: AC
  Administered 2013-03-12: 16:00:00 via TOPICAL

## 2013-03-12 NOTE — Progress Notes (Signed)
  Radiation Oncology         [phone] ________________________________  Name: [name] MRN: [id number]  Date: 02/23/2013  DOB: 1950-10-10  SIMULATION AND TREATMENT PLANNING NOTE  DIAGNOSIS:  Metastatic lung cancer with adrenal metastasis  NARRATIVE:  The patient was brought to the CT Simulation planning suite.  Identity was confirmed.  All relevant records and images related to the planned course of therapy were reviewed.   Written consent to proceed with treatment was confirmed which was freely given after reviewing the details related to the planned course of therapy had been reviewed with the patient.  Then, the patient was set-up in a stable reproducible  supine position for radiation therapy.  CT images were obtained.  Surface markings were placed.   The patient was setup with a customized VAC lock bag which was constructed to help with patient immobilization. This complex treatment device will be used daily. The patient was also set up using a wing board device.  The CT images were loaded into the planning software.  Then the target and avoidance structures were contoured.  Treatment planning then occurred.  The radiation prescription was entered and confirmed.  A total of 5 complex treatment devices were fabricated which relate to the designed radiation treatment fields. Each of these customized fields/ complex treatment devices will be used on a daily basis during the radiation course. I have requested : 3D Simulation  I have requested a DVH of the following structures: Gross tumor volume, left kidney, right kidney, spinal cord, bowel.   The patient will undergo daily image guidance to ensure accurate localization of the target, and adequate minimize dose to the normal surrounding structures in close proximity to the target.   PLAN:  The patient will receive 40 Gy in 16 fractions.   Special treatment procedure The patient will receive chemotherapy during the course of radiation  treatment. The patient may experience increased or overlapping toxicity due to this combined-modality approach and the patient will be monitored for such problems. This may include extra lab work as necessary. This therefore constitutes a special treatment procedure.    ________________________________   Radene Gunning, MD, PhD

## 2013-03-12 NOTE — Addendum Note (Signed)
Encounter addended by: Jonna Coup, MD on: 03/12/2013  5:45 PM<BR>     Documentation filed: Visit Diagnoses

## 2013-03-12 NOTE — Progress Notes (Signed)
Department of Radiation Oncology  Phone:  [phone] Fax:        [phone]  Weekly Treatment Note    Name: [name] Date: 03/12/2013 MRN: [id number] DOB: 02/26/1951   Current dose: 10 Gy  Current fraction: 4   MEDICATIONS: Current Outpatient Prescriptions  Medication Sig Dispense Refill  . albuterol (PROAIR HFA) 108 (90 BASE) MCG/ACT inhaler Inhale 2 puffs into the lungs every 6 (six) hours as needed for wheezing or shortness of breath.  1 Inhaler  1  . albuterol (PROVENTIL) (2.5 MG/3ML) 0.083% nebulizer solution Take 3 mLs (2.5 mg total) by nebulization every 6 (six) hours as needed for wheezing or shortness of breath.  75 mL  1  . aspirin EC 81 MG tablet Take 1 tablet (81 mg total) by mouth daily. Restart this medicine on 5/14  30 tablet  0  . flecainide (TAMBOCOR) 100 MG tablet Take 1 tablet (100 mg total) by mouth 2 (two) times daily.  60 tablet  5  . folic acid (FOLVITE) 1 MG tablet Take 1 tablet (1 mg total) by mouth daily.  30 tablet  2  . glucose 4 GM chewable tablet Chew 4 tablets (16 g total) by mouth as needed for low blood sugar.  50 tablet  12  . hyoscyamine (LEVSIN SL) 0.125 MG SL tablet Take 1 tablet (0.125 mg total) by mouth every 4 (four) hours as needed for cramping.  30 tablet  0  . ipratropium (ATROVENT) 0.02 % nebulizer solution Take 2.5 mLs (500 mcg total) by nebulization 4 (four) times daily as needed for wheezing (shortness of breath).  75 mL  1  . loratadine (CLARITIN) 10 MG tablet Take 1 tablet (10 mg total) by mouth daily.  30 tablet  0  . meclizine (ANTIVERT) 25 MG tablet Take 1 tablet (25 mg total) by mouth 3 (three) times daily as needed.  30 tablet  0  . MELATONIN ER PO Take 5 mg by mouth at bedtime.      . nebivolol (BYSTOLIC) 2.5 MG tablet Take 1 tablet (2.5 mg total) by mouth daily.  30 tablet  0  . oxyCODONE-acetaminophen (PERCOCET) 10-325 MG per tablet Take 1 tablet by mouth every 8 (eight) hours as needed for pain. Takes once daily   And prn for knees  30 tablet  0  . pantoprazole (PROTONIX) 40 MG tablet Take 1 tablet (40 mg total) by mouth daily. 40 mg po BID x 14 days, then daily continuous.  30 tablet  2  . potassium chloride SA (KLOR-CON M20) 20 MEQ tablet Take 1 tablet (20 mEq total) by mouth daily.  30 tablet  3  . rosuvastatin (CRESTOR) 40 MG tablet Take 1 tablet (40 mg total) by mouth daily.  30 tablet  11   No current facility-administered medications for this encounter.     ALLERGIES: Latex   LABORATORY DATA:  Lab Results  Component Value Date   WBC 7.4 03/11/2013   HGB 11.4* 03/11/2013   HCT 33.1* 03/11/2013   MCV 88.7 03/11/2013   PLT 74* 03/11/2013   Lab Results  Component Value Date   NA 143 02/19/2013   K 4.2 02/19/2013   CL 109* 02/19/2013   CO2 23 02/19/2013   Lab Results  Component Value Date   ALT 18 02/19/2013   AST 18 02/19/2013   ALKPHOS 51 02/19/2013   BILITOT 0.76 02/19/2013     NARRATIVE: [name] was seen today for weekly treatment management.  The chart was checked and the patient's films were reviewed. The patient is doing well. No problems with treatment in her first week. No nausea, no diarrhea.  PHYSICAL EXAMINATION: height is 5\' 2"  (1.575 m) and weight is 148 lb 4.8 oz (67.268 kg). Her temperature is 99.1 F (37.3 C). Her blood pressure is 139/82 and her pulse is 72. Her oxygen saturation is 99%.        ASSESSMENT: The patient is doing satisfactorily with treatment.  PLAN: We will continue with the patient's radiation treatment as planned.

## 2013-03-12 NOTE — Progress Notes (Signed)
[name] here for weekly under treat visit.  She has had 4/16 fractions to her abdomen.  She denies pain, nausea and diarrhea.   She has been given radiaplex gel and instructed to use it twice a day.

## 2013-03-13 ENCOUNTER — Ambulatory Visit: Payer: Medicare Other

## 2013-03-16 ENCOUNTER — Telehealth: Payer: Self-pay | Admitting: Dietician

## 2013-03-16 ENCOUNTER — Ambulatory Visit
Admission: RE | Admit: 2013-03-16 | Discharge: 2013-03-16 | Disposition: A | Discharge status: Home / Self Care | Payer: Medicare Other | Source: Ambulatory Visit | Attending: Radiation Oncology | Admitting: Radiation Oncology

## 2013-03-17 ENCOUNTER — Ambulatory Visit
Admission: RE | Admit: 2013-03-17 | Discharge: 2013-03-17 | Disposition: A | Discharge status: Home / Self Care | Payer: Medicare Other | Source: Ambulatory Visit | Attending: Radiation Oncology | Admitting: Radiation Oncology

## 2013-03-18 ENCOUNTER — Other Ambulatory Visit: Payer: Medicare Other | Admitting: Lab

## 2013-03-18 ENCOUNTER — Ambulatory Visit
Admission: RE | Admit: 2013-03-18 | Discharge: 2013-03-18 | Disposition: A | Discharge status: Home / Self Care | Payer: Medicare Other | Source: Ambulatory Visit | Attending: Radiation Oncology | Admitting: Radiation Oncology

## 2013-03-18 ENCOUNTER — Ambulatory Visit: Payer: Medicare Other | Admitting: Radiation Oncology

## 2013-03-18 ENCOUNTER — Ambulatory Visit: Payer: Medicare Other

## 2013-03-19 ENCOUNTER — Ambulatory Visit
Admission: RE | Admit: 2013-03-19 | Discharge: 2013-03-19 | Disposition: A | Discharge status: Home / Self Care | Payer: Medicare Other | Source: Ambulatory Visit | Attending: Radiation Oncology | Admitting: Radiation Oncology

## 2013-03-19 ENCOUNTER — Other Ambulatory Visit: Payer: Medicare Other

## 2013-03-20 ENCOUNTER — Telehealth: Payer: Self-pay | Admitting: Medical Oncology

## 2013-03-20 ENCOUNTER — Telehealth: Payer: Self-pay | Admitting: Oncology

## 2013-03-20 ENCOUNTER — Ambulatory Visit: Payer: Medicare Other

## 2013-03-20 NOTE — Telephone Encounter (Signed)
Called [facility] to check on Rodolfo Notaro because she missed her radiation appointment today.  Per Jeannetta Ellis, Sehar is tired today and will make her appointment on Monday for treatment.

## 2013-03-20 NOTE — Telephone Encounter (Signed)
I trasferred call to radiaiton because pt cannot come in for treatment today.

## 2013-03-23 ENCOUNTER — Ambulatory Visit
Admission: RE | Admit: 2013-03-23 | Discharge: 2013-03-23 | Disposition: A | Discharge status: Home / Self Care | Payer: Medicare Other | Source: Ambulatory Visit | Attending: Radiation Oncology | Admitting: Radiation Oncology

## 2013-03-23 VITALS — BP 152/71 | HR 88 | Temp 98.4°F | Ht 62.0 in | Wt 146.5 lb

## 2013-03-23 DIAGNOSIS — C7972 Secondary malignant neoplasm of left adrenal gland: Secondary | ICD-10-CM

## 2013-03-23 NOTE — Progress Notes (Signed)
Department of Radiation Oncology  Phone:  [phone] Fax:        [phone]  Weekly Treatment Note    Name: [name] Date: 03/23/2013 MRN: [id number] DOB: [birth year]   Current dose: 22.5 Gy  Current fraction: 9   MEDICATIONS: Current Outpatient Prescriptions  Medication Sig Dispense Refill  . albuterol (PROAIR HFA) 108 (90 BASE) MCG/ACT inhaler Inhale 2 puffs into the lungs every 6 (six) hours as needed for wheezing or shortness of breath.  1 Inhaler  1  . albuterol (PROVENTIL) (2.5 MG/3ML) 0.083% nebulizer solution Take 3 mLs (2.5 mg total) by nebulization every 6 (six) hours as needed for wheezing or shortness of breath.  75 mL  1  . aspirin EC 81 MG tablet Take 1 tablet (81 mg total) by mouth daily. Restart this medicine on 5/14  30 tablet  0  . flecainide (TAMBOCOR) 100 MG tablet Take 1 tablet (100 mg total) by mouth 2 (two) times daily.  60 tablet  5  . folic acid (FOLVITE) 1 MG tablet Take 1 tablet (1 mg total) by mouth daily.  30 tablet  2  . glucose 4 GM chewable tablet Chew 4 tablets (16 g total) by mouth as needed for low blood sugar.  50 tablet  12  . hyoscyamine (LEVSIN SL) 0.125 MG SL tablet Take 1 tablet (0.125 mg total) by mouth every 4 (four) hours as needed for cramping.  30 tablet  0  . ipratropium (ATROVENT) 0.02 % nebulizer solution Take 2.5 mLs (500 mcg total) by nebulization 4 (four) times daily as needed for wheezing (shortness of breath).  75 mL  1  . loratadine (CLARITIN) 10 MG tablet Take 1 tablet (10 mg total) by mouth daily.  30 tablet  0  . meclizine (ANTIVERT) 25 MG tablet Take 1 tablet (25 mg total) by mouth 3 (three) times daily as needed.  30 tablet  0  . MELATONIN ER PO Take 5 mg by mouth at bedtime.      . nebivolol (BYSTOLIC) 2.5 MG tablet Take 1 tablet (2.5 mg total) by mouth daily.  30 tablet  0  . oxyCODONE-acetaminophen (PERCOCET) 10-325 MG per tablet Take 1 tablet by mouth every 8 (eight) hours as needed for pain. Takes once daily   And prn for knees  30 tablet  0  . pantoprazole (PROTONIX) 40 MG tablet Take 1 tablet (40 mg total) by mouth daily. 40 mg po BID x 14 days, then daily continuous.  30 tablet  2  . potassium chloride SA (KLOR-CON M20) 20 MEQ tablet Take 1 tablet (20 mEq total) by mouth daily.  30 tablet  3  . rosuvastatin (CRESTOR) 40 MG tablet Take 1 tablet (40 mg total) by mouth daily.  30 tablet  11   No current facility-administered medications for this encounter.     ALLERGIES: Latex   LABORATORY DATA:  Lab Results  Component Value Date   WBC 7.4 03/11/2013   HGB 11.4* 03/11/2013   HCT 33.1* 03/11/2013   MCV 88.7 03/11/2013   PLT 74* 03/11/2013   Lab Results  Component Value Date   NA 143 02/19/2013   K 4.2 02/19/2013   CL 109* 02/19/2013   CO2 23 02/19/2013   Lab Results  Component Value Date   ALT 18 02/19/2013   AST 18 02/19/2013   ALKPHOS 51 02/19/2013   BILITOT 0.76 02/19/2013     NARRATIVE: [name] was seen today for weekly treatment management.  The chart was checked and the patient's films were reviewed. The patient complains of poor appetite. Otherwise doing well. No diarrhea. She does have some fatigue.  PHYSICAL EXAMINATION: height is 5\' 2"  (1.575 m) and weight is 146 lb 8 oz (66.452 kg). Her temperature is 98.4 F (36.9 C). Her blood pressure is 152/71 and her pulse is 88.        ASSESSMENT: The patient is doing satisfactorily with treatment.  PLAN: We will continue with the patient's radiation treatment as planned. The patient was encouraged to take in small amounts more frequently during the day. No real problem with nausea.

## 2013-03-23 NOTE — Progress Notes (Signed)
[name] here for weekly under treat visit.  She has missed her treatment on Friday.  She has had 9 fractions to her abdomen.  She denies pain.  She does have occasional nausea.  She has lost 2 lbs since 6/5/214.  She states that she does not feel like eating and her appetite is very poor.  She denies diarrhea.  She does have fatigue.   She does have hyperpigmentation on her abdomen.  She states the skin is itching.  She does use radiaplex twice a day.

## 2013-03-24 ENCOUNTER — Ambulatory Visit
Admission: RE | Admit: 2013-03-24 | Discharge: 2013-03-24 | Disposition: A | Discharge status: Home / Self Care | Payer: Medicare Other | Source: Ambulatory Visit | Attending: Radiation Oncology | Admitting: Radiation Oncology

## 2013-03-25 ENCOUNTER — Ambulatory Visit: Payer: Medicare Other

## 2013-03-25 ENCOUNTER — Other Ambulatory Visit: Payer: Medicare Other | Admitting: Lab

## 2013-03-25 ENCOUNTER — Ambulatory Visit: Payer: Medicare Other | Admitting: Radiation Oncology

## 2013-03-26 ENCOUNTER — Other Ambulatory Visit: Payer: Medicare Other

## 2013-03-26 ENCOUNTER — Ambulatory Visit
Admission: RE | Admit: 2013-03-26 | Discharge: 2013-03-26 | Disposition: A | Discharge status: Home / Self Care | Payer: Medicare Other | Source: Ambulatory Visit | Attending: Radiation Oncology | Admitting: Radiation Oncology

## 2013-03-27 ENCOUNTER — Ambulatory Visit: Payer: Medicare Other

## 2013-03-27 ENCOUNTER — Encounter: Payer: Self-pay | Admitting: Radiation Oncology

## 2013-03-27 ENCOUNTER — Ambulatory Visit
Admission: RE | Admit: 2013-03-27 | Discharge: 2013-03-27 | Disposition: A | Discharge status: Home / Self Care | Payer: Medicare Other | Source: Ambulatory Visit | Attending: Radiation Oncology | Admitting: Radiation Oncology

## 2013-03-27 VITALS — BP 151/66 | HR 80 | Temp 98.1°F | Resp 18 | Wt 144.4 lb

## 2013-03-27 DIAGNOSIS — C7972 Secondary malignant neoplasm of left adrenal gland: Secondary | ICD-10-CM

## 2013-03-27 MED ORDER — PROCHLORPERAZINE MALEATE 10 MG PO TABS
10.0000 mg | ORAL_TABLET | Freq: Four times a day (QID) | ORAL | Status: AC | PRN
Start: 2013-03-27 — End: ?

## 2013-03-27 NOTE — Progress Notes (Signed)
Reports persistent nausea. Patient states,"I don't take anything for nausea, I just let it pass but, today it hasn't yet." Patient has not been prescribed an antiemetic to date. Patient denies emesis. Patient reports a decreased appetite related to nausea. Two pound weight loss noted since 03/23/2013. Patient denies headache, pain or dizziness.

## 2013-03-27 NOTE — Progress Notes (Signed)
Department of Radiation Oncology  Phone:  [phone] Fax:        [phone]  Weekly Treatment Note    Name: DONNIE GEDEON Date: 03/27/2013 MRN: [id number] DOB: 02/05/1951   Current dose: 30 Gy  Current fraction: 12   MEDICATIONS: Current Outpatient Prescriptions  Medication Sig Dispense Refill  . albuterol (PROAIR HFA) 108 (90 BASE) MCG/ACT inhaler Inhale 2 puffs into the lungs every 6 (six) hours as needed for wheezing or shortness of breath.  1 Inhaler  1  . albuterol (PROVENTIL) (2.5 MG/3ML) 0.083% nebulizer solution Take 3 mLs (2.5 mg total) by nebulization every 6 (six) hours as needed for wheezing or shortness of breath.  75 mL  1  . aspirin EC 81 MG tablet Take 1 tablet (81 mg total) by mouth daily. Restart this medicine on 5/14  30 tablet  0  . flecainide (TAMBOCOR) 100 MG tablet Take 1 tablet (100 mg total) by mouth 2 (two) times daily.  60 tablet  5  . folic acid (FOLVITE) 1 MG tablet Take 1 tablet (1 mg total) by mouth daily.  30 tablet  2  . glucose 4 GM chewable tablet Chew 4 tablets (16 g total) by mouth as needed for low blood sugar.  50 tablet  12  . hyoscyamine (LEVSIN SL) 0.125 MG SL tablet Take 1 tablet (0.125 mg total) by mouth every 4 (four) hours as needed for cramping.  30 tablet  0  . ipratropium (ATROVENT) 0.02 % nebulizer solution Take 2.5 mLs (500 mcg total) by nebulization 4 (four) times daily as needed for wheezing (shortness of breath).  75 mL  1  . loratadine (CLARITIN) 10 MG tablet Take 1 tablet (10 mg total) by mouth daily.  30 tablet  0  . meclizine (ANTIVERT) 25 MG tablet Take 1 tablet (25 mg total) by mouth 3 (three) times daily as needed.  30 tablet  0  . MELATONIN ER PO Take 5 mg by mouth at bedtime.      . nebivolol (BYSTOLIC) 2.5 MG tablet Take 1 tablet (2.5 mg total) by mouth daily.  30 tablet  0  . oxyCODONE-acetaminophen (PERCOCET) 10-325 MG per tablet Take 1 tablet by mouth every 8 (eight) hours as needed for pain. Takes once daily   And prn for knees  30 tablet  0  . pantoprazole (PROTONIX) 40 MG tablet Take 1 tablet (40 mg total) by mouth daily. 40 mg po BID x 14 days, then daily continuous.  30 tablet  2  . potassium chloride SA (KLOR-CON M20) 20 MEQ tablet Take 1 tablet (20 mEq total) by mouth daily.  30 tablet  3  . rosuvastatin (CRESTOR) 40 MG tablet Take 1 tablet (40 mg total) by mouth daily.  30 tablet  11   No current facility-administered medications for this encounter.     ALLERGIES: Latex   LABORATORY DATA:  Lab Results  Component Value Date   WBC 7.4 03/11/2013   HGB 11.4* 03/11/2013   HCT 33.1* 03/11/2013   MCV 88.7 03/11/2013   PLT 74* 03/11/2013   Lab Results  Component Value Date   NA 143 02/19/2013   K 4.2 02/19/2013   CL 109* 02/19/2013   CO2 23 02/19/2013   Lab Results  Component Value Date   ALT 18 02/19/2013   AST 18 02/19/2013   ALKPHOS 51 02/19/2013   BILITOT 0.76 02/19/2013     NARRATIVE: [name] was seen today for weekly treatment management.  The chart was checked and the patient's films were reviewed. The patient is complaining of some nausea. She's had a little bit of this before but it is more persistent now. No diarrhea or other complaints.  PHYSICAL EXAMINATION: weight is 144 lb 6.4 oz (65.499 kg). Her oral temperature is 98.1 F (36.7 C). Her blood pressure is 151/66 and her pulse is 80. Her respiration is 18 and oxygen saturation is 100%.        ASSESSMENT: The patient is doing satisfactorily with treatment.  PLAN: We will continue with the patient's radiation treatment as planned. I have called in a prescription for Compazine for nausea. She is to let us know if this does not control her nausea.

## 2013-03-28 ENCOUNTER — Ambulatory Visit: Payer: Medicare Other

## 2013-03-30 ENCOUNTER — Ambulatory Visit: Payer: Medicare Other

## 2013-03-30 ENCOUNTER — Ambulatory Visit
Admission: RE | Admit: 2013-03-30 | Discharge: 2013-03-30 | Disposition: A | Discharge status: Home / Self Care | Payer: Medicare Other | Source: Ambulatory Visit | Attending: Radiation Oncology | Admitting: Radiation Oncology

## 2013-03-31 ENCOUNTER — Ambulatory Visit
Admission: RE | Admit: 2013-03-31 | Discharge: 2013-03-31 | Disposition: A | Discharge status: Home / Self Care | Payer: Medicare Other | Source: Ambulatory Visit | Attending: Radiation Oncology | Admitting: Radiation Oncology

## 2013-03-31 ENCOUNTER — Ambulatory Visit: Payer: Medicare Other

## 2013-04-01 ENCOUNTER — Telehealth: Payer: Self-pay | Admitting: Internal Medicine

## 2013-04-01 ENCOUNTER — Other Ambulatory Visit (HOSPITAL_BASED_OUTPATIENT_CLINIC_OR_DEPARTMENT_OTHER): Payer: Medicare Other | Admitting: Lab

## 2013-04-01 ENCOUNTER — Ambulatory Visit: Payer: Medicare Other

## 2013-04-01 ENCOUNTER — Ambulatory Visit
Admission: RE | Admit: 2013-04-01 | Discharge: 2013-04-01 | Disposition: A | Discharge status: Home / Self Care | Payer: Medicare Other | Source: Ambulatory Visit | Attending: Radiation Oncology | Admitting: Radiation Oncology

## 2013-04-01 ENCOUNTER — Other Ambulatory Visit: Payer: Medicare Other

## 2013-04-01 DIAGNOSIS — C342 Malignant neoplasm of middle lobe, bronchus or lung: Secondary | ICD-10-CM

## 2013-04-01 DIAGNOSIS — C3490 Malignant neoplasm of unspecified part of unspecified bronchus or lung: Secondary | ICD-10-CM

## 2013-04-01 LAB — COMPREHENSIVE METABOLIC PANEL (CC13)
ALT: 17 U/L (ref 0–55)
Alkaline Phosphatase: 42 U/L (ref 40–150)
Glucose: 97 mg/dl (ref 70–99)
Sodium: 142 mEq/L (ref 136–145)
Total Bilirubin: 0.49 mg/dL (ref 0.20–1.20)
Total Protein: 6.9 g/dL (ref 6.4–8.3)

## 2013-04-01 LAB — CBC WITH DIFFERENTIAL/PLATELET
BASO%: 0.3 % (ref 0.0–2.0)
EOS%: 0.8 % (ref 0.0–7.0)
LYMPH%: 3.6 % — ABNORMAL LOW (ref 14.0–49.7)
MCH: 31.7 pg (ref 25.1–34.0)
MCHC: 34.2 g/dL (ref 31.5–36.0)
MCV: 92.8 fL (ref 79.5–101.0)
MONO%: 14.2 % — ABNORMAL HIGH (ref 0.0–14.0)
Platelets: 162 10*3/uL (ref 145–400)
RBC: 3.58 10*6/uL — ABNORMAL LOW (ref 3.70–5.45)

## 2013-04-02 ENCOUNTER — Ambulatory Visit
Admission: RE | Admit: 2013-04-02 | Discharge: 2013-04-02 | Disposition: A | Discharge status: Home / Self Care | Payer: Medicare Other | Source: Ambulatory Visit | Attending: Radiation Oncology | Admitting: Radiation Oncology

## 2013-04-02 ENCOUNTER — Ambulatory Visit: Payer: Medicare Other

## 2013-04-02 ENCOUNTER — Other Ambulatory Visit: Payer: Medicare Other | Admitting: Lab

## 2013-04-02 ENCOUNTER — Encounter: Payer: Self-pay | Admitting: Radiation Oncology

## 2013-04-02 VITALS — BP 102/82 | HR 88 | Temp 98.2°F | Ht 62.0 in | Wt 149.4 lb

## 2013-04-02 DIAGNOSIS — C7972 Secondary malignant neoplasm of left adrenal gland: Secondary | ICD-10-CM

## 2013-04-02 MED ORDER — OXYCODONE-ACETAMINOPHEN 10-325 MG PO TABS: 1.0000 | ORAL_TABLET | Freq: Three times a day (TID) | ORAL | Status: DC | PRN

## 2013-04-02 NOTE — Progress Notes (Signed)
[name] here for final treatment visit.  She has had 16 fractions to her abdomen.  She denies pain.  She does have nausea and has compazine which helps.  She is requesting a refill on her percocet for pain in her knees.  She is having trouble with her memory and was having a little trouble talking today.  The skin on her upper arms are dry and peeling.  The skin on her abdomen is intact with hyperpigmentation.

## 2013-04-02 NOTE — Progress Notes (Signed)
Department of Radiation Oncology  Phone:  [phone] Fax:        [phone]  Weekly Treatment Note    Name: [name] Date: 04/02/2013 MRN: [id number] DOB: 1951-08-08   Current dose: 40 Gy  Current fraction: 16   MEDICATIONS: Current Outpatient Prescriptions  Medication Sig Dispense Refill  . albuterol (PROAIR HFA) 108 (90 BASE) MCG/ACT inhaler Inhale 2 puffs into the lungs every 6 (six) hours as needed for wheezing or shortness of breath.  1 Inhaler  1  . albuterol (PROVENTIL) (2.5 MG/3ML) 0.083% nebulizer solution Take 3 mLs (2.5 mg total) by nebulization every 6 (six) hours as needed for wheezing or shortness of breath.  75 mL  1  . aspirin EC 81 MG tablet Take 1 tablet (81 mg total) by mouth daily. Restart this medicine on 5/14  30 tablet  0  . flecainide (TAMBOCOR) 100 MG tablet Take 1 tablet (100 mg total) by mouth 2 (two) times daily.  60 tablet  5  . folic acid (FOLVITE) 1 MG tablet Take 1 tablet (1 mg total) by mouth daily.  30 tablet  2  . glucose 4 GM chewable tablet Chew 4 tablets (16 g total) by mouth as needed for low blood sugar.  50 tablet  12  . hyoscyamine (LEVSIN SL) 0.125 MG SL tablet Take 1 tablet (0.125 mg total) by mouth every 4 (four) hours as needed for cramping.  30 tablet  0  . ipratropium (ATROVENT) 0.02 % nebulizer solution Take 2.5 mLs (500 mcg total) by nebulization 4 (four) times daily as needed for wheezing (shortness of breath).  75 mL  1  . loratadine (CLARITIN) 10 MG tablet Take 1 tablet (10 mg total) by mouth daily.  30 tablet  0  . meclizine (ANTIVERT) 25 MG tablet Take 1 tablet (25 mg total) by mouth 3 (three) times daily as needed.  30 tablet  0  . nebivolol (BYSTOLIC) 2.5 MG tablet Take 1 tablet (2.5 mg total) by mouth daily.  30 tablet  0  . pantoprazole (PROTONIX) 40 MG tablet Take 1 tablet (40 mg total) by mouth daily. 40 mg po BID x 14 days, then daily continuous.  30 tablet  2  . potassium chloride SA (KLOR-CON M20) 20 MEQ  tablet Take 1 tablet (20 mEq total) by mouth daily.  30 tablet  3  . prochlorperazine (COMPAZINE) 10 MG tablet Take 1 tablet (10 mg total) by mouth every 6 (six) hours as needed.  30 tablet  0  . rosuvastatin (CRESTOR) 40 MG tablet Take 1 tablet (40 mg total) by mouth daily.  30 tablet  11  . MELATONIN ER PO Take 5 mg by mouth at bedtime.      [name] oxyCODONE-acetaminophen (PERCOCET) 10-325 MG per tablet Take 1 tablet by mouth every 8 (eight) hours as needed for pain. Takes once daily  And prn for knees  30 tablet  0   No current facility-administered medications for this encounter.     ALLERGIES: Latex   LABORATORY DATA:  Lab Results  Component Value Date   WBC 10.8* 04/01/2013   HGB 11.4* 04/01/2013   HCT 33.3* 04/01/2013   MCV 92.8 04/01/2013   PLT 162 04/01/2013   Lab Results  Component Value Date   NA 142 04/01/2013   K 3.4* 04/01/2013   CL 106 04/01/2013   CO2 23 04/01/2013   Lab Results  Component Value Date   ALT 17 04/01/2013   AST 23  04/01/2013   ALKPHOS 42 04/01/2013   BILITOT 0.49 04/01/2013     NARRATIVE: [name] was seen today for weekly treatment management. The chart was checked and the patient's films were reviewed. The patient finished her final fraction today. She did well with her treatment. She has had a little bit of nausea which has been well controlled with Compazine. No other significant complaints.  PHYSICAL EXAMINATION: height is 5\' 2"  (1.575 m) and weight is 149 lb 6.4 oz (67.767 kg). Her temperature is 98.2 F (36.8 C). Her blood pressure is 102/82 and her pulse is 88. Her oxygen saturation is 98%.        ASSESSMENT: The patient did satisfactorily with treatment.  PLAN: Followup in one month.

## 2013-04-03 ENCOUNTER — Telehealth: Payer: Self-pay | Admitting: Dietician

## 2013-04-08 ENCOUNTER — Other Ambulatory Visit (HOSPITAL_BASED_OUTPATIENT_CLINIC_OR_DEPARTMENT_OTHER): Payer: Medicare Other | Admitting: Lab

## 2013-04-08 ENCOUNTER — Ambulatory Visit (HOSPITAL_BASED_OUTPATIENT_CLINIC_OR_DEPARTMENT_OTHER): Payer: Medicare Other | Admitting: Internal Medicine

## 2013-04-08 ENCOUNTER — Telehealth: Payer: Self-pay | Admitting: Internal Medicine

## 2013-04-08 ENCOUNTER — Telehealth: Payer: Self-pay | Admitting: *Deleted

## 2013-04-08 ENCOUNTER — Encounter: Payer: Self-pay | Admitting: Internal Medicine

## 2013-04-08 ENCOUNTER — Ambulatory Visit (HOSPITAL_BASED_OUTPATIENT_CLINIC_OR_DEPARTMENT_OTHER): Payer: Medicare Other

## 2013-04-08 VITALS — BP 171/86 | HR 93 | Temp 96.8°F | Resp 19 | Ht 62.0 in | Wt 147.2 lb

## 2013-04-08 DIAGNOSIS — Z5111 Encounter for antineoplastic chemotherapy: Secondary | ICD-10-CM

## 2013-04-08 DIAGNOSIS — C342 Malignant neoplasm of middle lobe, bronchus or lung: Secondary | ICD-10-CM

## 2013-04-08 DIAGNOSIS — C7949 Secondary malignant neoplasm of other parts of nervous system: Secondary | ICD-10-CM

## 2013-04-08 DIAGNOSIS — C3490 Malignant neoplasm of unspecified part of unspecified bronchus or lung: Secondary | ICD-10-CM

## 2013-04-08 DIAGNOSIS — C7931 Secondary malignant neoplasm of brain: Secondary | ICD-10-CM

## 2013-04-08 DIAGNOSIS — C3430 Malignant neoplasm of lower lobe, unspecified bronchus or lung: Secondary | ICD-10-CM

## 2013-04-08 DIAGNOSIS — R0602 Shortness of breath: Secondary | ICD-10-CM

## 2013-04-08 LAB — COMPREHENSIVE METABOLIC PANEL (CC13)
BUN: 13.7 mg/dL (ref 7.0–26.0)
CO2: 24 mEq/L (ref 22–29)
Creatinine: 0.8 mg/dL (ref 0.6–1.1)
Glucose: 112 mg/dl (ref 70–140)
Total Bilirubin: 0.43 mg/dL (ref 0.20–1.20)

## 2013-04-08 LAB — TECHNOLOGIST REVIEW

## 2013-04-08 LAB — CBC WITH DIFFERENTIAL/PLATELET
Basophils Absolute: 0 10*3/uL (ref 0.0–0.1)
Eosinophils Absolute: 0.2 10*3/uL (ref 0.0–0.5)
LYMPH%: 9.6 % — ABNORMAL LOW (ref 14.0–49.7)
MCV: 96.1 fL (ref 79.5–101.0)
MONO%: 9.3 % (ref 0.0–14.0)
NEUT#: 7.1 10*3/uL — ABNORMAL HIGH (ref 1.5–6.5)
Platelets: 141 10*3/uL — ABNORMAL LOW (ref 145–400)
RBC: 3.58 10*6/uL — ABNORMAL LOW (ref 3.70–5.45)
nRBC: 0 % (ref 0–0)

## 2013-04-08 MED ORDER — SODIUM CHLORIDE 0.9 % IV SOLN
490.0000 mg | Freq: Once | INTRAVENOUS | Status: AC
  Administered 2013-04-08: 490 mg via INTRAVENOUS
  Filled 2013-04-08: qty 49

## 2013-04-08 MED ORDER — DEXAMETHASONE SODIUM PHOSPHATE 20 MG/5ML IJ SOLN
20.0000 mg | Freq: Once | INTRAMUSCULAR | Status: AC
  Administered 2013-04-08: 20 mg via INTRAVENOUS

## 2013-04-08 MED ORDER — SODIUM CHLORIDE 0.9 % IV SOLN
Freq: Once | INTRAVENOUS | Status: AC
  Administered 2013-04-08: 10:00:00 via INTRAVENOUS

## 2013-04-08 MED ORDER — SODIUM CHLORIDE 0.9 % IV SOLN
500.0000 mg/m2 | Freq: Once | INTRAVENOUS | Status: AC
  Administered 2013-04-08: 850 mg via INTRAVENOUS
  Filled 2013-04-08: qty 34

## 2013-04-08 MED ORDER — ONDANSETRON 16 MG/50ML IVPB (CHCC)
16.0000 mg | Freq: Once | INTRAVENOUS | Status: AC
  Administered 2013-04-08: 16 mg via INTRAVENOUS

## 2013-04-08 MED ORDER — CYANOCOBALAMIN 1000 MCG/ML IJ SOLN
1000.0000 ug | Freq: Once | INTRAMUSCULAR | Status: AC
  Administered 2013-04-08: 1000 ug via INTRAMUSCULAR

## 2013-04-08 NOTE — Telephone Encounter (Signed)
Per staff message and POF I have scheduled appts.  JMW  

## 2013-04-08 NOTE — Patient Instructions (Addendum)
[facility] Discharge Instructions for Patients Receiving Chemotherapy  Today you received the following chemotherapy agents: alimta, carboplatin   To help prevent nausea and vomiting after your treatment, we encourage you to take your nausea medication.  Take it as often as prescribed.     If you develop nausea and vomiting that is not controlled by your nausea medication, call the clinic. If it is after clinic hours your family physician or the after hours number for the clinic or go to the Emergency Department.   BELOW ARE SYMPTOMS THAT SHOULD BE REPORTED IMMEDIATELY:  *FEVER GREATER THAN 100.5 F  *CHILLS WITH OR WITHOUT FEVER  NAUSEA AND VOMITING THAT IS NOT CONTROLLED WITH YOUR NAUSEA MEDICATION  *UNUSUAL SHORTNESS OF BREATH  *UNUSUAL BRUISING OR BLEEDING  TENDERNESS IN MOUTH AND THROAT WITH OR WITHOUT PRESENCE OF ULCERS  *URINARY PROBLEMS  *BOWEL PROBLEMS  UNUSUAL RASH Items with * indicate a potential emergency and should be followed up as soon as possible.  Feel free to call the clinic you have any questions or concerns. The clinic phone number is [phone].   I have been informed and understand all the instructions given to me. I know to contact the clinic, my physician, or go to the Emergency Department if any problems should occur. I do not have any questions at this time, but understand that I may call the clinic during office hours   should I have any questions or need assistance in obtaining follow up care.    __________________________________________  _____________  __________ Signature of Patient or Authorized Representative            Date                   Time    __________________________________________ Nurse's Signature    

## 2013-04-08 NOTE — Patient Instructions (Signed)
Continue chemotherapy today as scheduled.  Followup visit in 3 weeks with the next cycle of chemotherapy 

## 2013-04-08 NOTE — Progress Notes (Signed)
[facility] [phone]   [phone]  OFFICE PROGRESS NOTE  Ernestine Conrad, MD [facility] [address] [place] [address]  DIAGNOSIS: Metastatic non-small cell lung cancer initially diagnosed as synchronous primary non-small cell lung cancer, adenocarcinoma with negative EGFR mutation and negative ALK gene translocation involving the right middle lobe and lower lobe diagnosed in July of 2013.   PRIOR THERAPY:  1) Stereotactic radiotherapy to the right lung lesions under the care of Dr. Mitzi Hansen completed on 07/09/2012.  2) whole brain irradiation under the care of Dr. Mitzi Hansen completed on 12/04/2012.  3) palliative radiotherapy to the left adrenal gland under the care of Dr. Mitzi Hansen expected to be completed on 03/13/13.  CURRENT THERAPY:  Systemic chemotherapy with carboplatin for AUC of 5 and Alimta 500 mg/M2 every 3 weeks. Status post 5 cycles.    INTERVAL HISTORY: [name] 62 y.o. female returns to the clinic today for followup visit. The patient is feeling fine today except for fatigue. She also has some memory issues and it takes her sometimes to remember what she wants to say. She denied having any significant chest pain, shortness breath, cough or hemoptysis. She denied having any nausea or vomiting. She has no fever or chills. The patient denied having any significant chest pain but continues to have shortness breath with exertion and no cough or hemoptysis. She is here today to resume her systemic chemotherapy with carboplatin and Alimta.  MEDICAL HISTORY: Past Medical History  Diagnosis Date  . Atrial fibrillation   . Coronary atherosclerosis of native coronary artery     Nonobstructive, LVEF 60%  . COPD (chronic obstructive pulmonary disease)   . Mixed hyperlipidemia   . Essential hypertension, benign   . Tachycardia-bradycardia syndrome   . Embolism - blood clot     PE  . SOB (shortness of breath) on exertion   .  Allergy     Latex  . Asthma   . Anxiety   . Myocardial infarction     12 years ago ?  [name] History of radiation therapy     59 gy fraction 5 eot 07/09/12 right lung  . Lung cancer 04/29/12 dx    RLL needle bx=invasive well diff adenocarcinoma  [facility]  . 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  MEDICATIONS:  Current Outpatient Prescriptions  Medication Sig Dispense Refill  . albuterol (PROAIR HFA) 108 (90 BASE) MCG/ACT inhaler Inhale 2 puffs into the lungs every 6 (six) hours as needed for wheezing or shortness of breath.  1 Inhaler  1  . albuterol (PROVENTIL) (2.5 MG/3ML) 0.083% nebulizer solution Take 3 mLs (2.5 mg total) by nebulization every 6 (six) hours as needed for wheezing or shortness of breath.  75 mL  1  . aspirin EC 81 MG tablet Take 1 tablet (81 mg total) by mouth daily. Restart this medicine on 5/14  30 tablet  0  . flecainide (TAMBOCOR) 100 MG tablet Take 1 tablet (100 mg total) by mouth 2 (two) times daily.  60 tablet  5  . folic acid (FOLVITE) 1 MG tablet Take 1 tablet (1 mg total) by mouth daily.  30 tablet  2  . glucose 4 GM chewable tablet Chew 4 tablets (16 g total) by mouth as needed for low blood sugar.  50 tablet  12  . hyoscyamine (LEVSIN SL) 0.125 MG SL tablet Take 1  tablet (0.125 mg total) by mouth every 4 (four) hours as needed for cramping.  30 tablet  0  . ipratropium (ATROVENT) 0.02 % nebulizer solution Take 2.5 mLs (500 mcg total) by nebulization 4 (four) times daily as needed for wheezing (shortness of breath).  75 mL  1  . loratadine (CLARITIN) 10 MG tablet Take 1 tablet (10 mg total) by mouth daily.  30 tablet  0  . meclizine (ANTIVERT) 25 MG tablet Take 1 tablet (25 mg total) by mouth 3 (three) times daily as needed.  30 tablet  0  . MELATONIN ER PO Take 5 mg by mouth at bedtime.      . nebivolol (BYSTOLIC) 2.5 MG tablet Take 1 tablet (2.5 mg total) by mouth daily.  30 tablet  0  .  oxyCODONE-acetaminophen (PERCOCET) 10-325 MG per tablet Take 1 tablet by mouth every 8 (eight) hours as needed for pain. Takes once daily  And prn for knees  30 tablet  0  . pantoprazole (PROTONIX) 40 MG tablet Take 1 tablet (40 mg total) by mouth daily. 40 mg po BID x 14 days, then daily continuous.  30 tablet  2  . potassium chloride SA (KLOR-CON M20) 20 MEQ tablet Take 1 tablet (20 mEq total) by mouth daily.  30 tablet  3  . prochlorperazine (COMPAZINE) 10 MG tablet Take 1 tablet (10 mg total) by mouth every 6 (six) hours as needed.  30 tablet  0  . rosuvastatin (CRESTOR) 40 MG tablet Take 1 tablet (40 mg total) by mouth daily.  30 tablet  11   No current facility-administered medications for this visit.    SURGICAL HISTORY:  Past Surgical History  Procedure Laterality Date  . Appendectomy    . Insert / replace / remove pacemaker  3/11     [employer] - Dr. [name]  . Video bronchoscopy  02/22/2012    Procedure: VIDEO BRONCHOSCOPY WITH FLUORO;  Surgeon: Nyoka Cowden, MD;  Location: [facility];  Service: Cardiopulmonary;  Laterality: Bilateral;  . Lung biopsy  04/29/12    RLL invasive well diff adenocarcinoma  . Lung biopsy  02/22/12    RML=benign lung parenchyma,no tumor sen  . Tonsillectomy and adenoidectomy      age 57  . Total abdominal hysterectomy  1975    1/2 right ovary intact, left salpingo-oppherctomy  . Mr brain w wo contrast  11/17/2012       . Esophagogastroduodenoscopy N/A 01/15/2013    Procedure: ESOPHAGOGASTRODUODENOSCOPY (EGD);  Surgeon: Corbin Ade, MD;  Location: [facility];  Service: Endoscopy;  Laterality: N/A;    REVIEW OF SYSTEMS:  A comprehensive review of systems was negative except for: Constitutional: positive for fatigue Neurological: positive for memory problems   PHYSICAL EXAMINATION: General appearance: alert, cooperative, fatigued and no distress Head: Normocephalic, without obvious abnormality, atraumatic Neck: no adenopathy Lymph nodes:  Cervical, supraclavicular, and axillary nodes normal. Resp: clear to auscultation bilaterally Cardio: regular rate and rhythm, S1, S2 normal, no murmur, click, rub or gallop GI: soft, non-tender; bowel sounds normal; no masses,  no organomegaly Extremities: extremities normal, atraumatic, no cyanosis or edema  ECOG PERFORMANCE STATUS: 2 - Symptomatic, <50% confined to bed  Blood pressure 171/86, pulse 93, temperature 96.8 F (36 C), temperature source Oral, resp. rate 19, height 5\' 2"  (1.575 m), weight 147 lb 3.2 oz (66.769 kg).  LABORATORY DATA: Lab Results  Component Value Date   WBC 9.1 04/08/2013   HGB 11.2* 04/08/2013  HCT 34.4* 04/08/2013   MCV 96.1 04/08/2013   PLT 141* 04/08/2013      Chemistry      Component Value Date/Time   NA 142 04/01/2013 1518   NA 142 01/19/2013 0412   K 3.4* 04/01/2013 1518   K 3.1* 01/19/2013 0412   CL 106 04/01/2013 1518   CL 108 01/19/2013 0412   CO2 23 04/01/2013 1518   CO2 27 01/19/2013 0412   BUN 14.2 04/01/2013 1518   BUN 16 01/19/2013 0412   CREATININE 0.8 04/01/2013 1518   CREATININE 0.57 01/19/2013 0412   CREATININE 0.80 09/24/2011 1058   GLU Above linear limit,repeat venous draw for confirmation 12/18/2012 1643      Component Value Date/Time   CALCIUM 10.1 04/01/2013 1518   CALCIUM 8.0* 01/19/2013 0412   ALKPHOS 42 04/01/2013 1518   ALKPHOS 62 01/16/2013 0434   AST 23 04/01/2013 1518   AST 20 01/16/2013 0434   ALT 17 04/01/2013 1518   ALT 36* 01/16/2013 0434   BILITOT 0.49 04/01/2013 1518   BILITOT 0.3 01/16/2013 0434       RADIOGRAPHIC STUDIES: No results found.  ASSESSMENT AND PLAN: This is a very pleasant 62 years old white female with history of metastatic non-small cell lung cancer status post stereotactic radiotherapy to the right lung lesion in addition to whole brain irradiation and she is currently on systemic chemotherapy with carboplatin and Alimta status post 4 cycles. The patient is doing fine today. We'll proceed with cycle #5 today  as scheduled. She would come back for followup visit in 3 weeks with the next cycle of her systemic chemotherapy.  She was advised to call immediately if she has any concerning symptoms in the interval please  All questions were answered. The patient knows to call the clinic with any problems, questions or concerns. We can certainly see the patient much sooner if necessary.

## 2013-04-08 NOTE — Telephone Encounter (Signed)
Gave pt appt for lab, chemo and MD on July 2014

## 2013-04-10 NOTE — Progress Notes (Signed)
  Radiation Oncology         [phone] ________________________________  Name: [name] MRN: [id number]  Date: 04/02/2013  DOB: 1951-08-10  End of Treatment Note  Diagnosis:   Metastatic lung cancer with adrenal metastasis     Indication for treatment:  Palliative       Radiation treatment dates:   03/09/2013 through 04/02/2013  Site/dose:   The patient was treated to an enlarging metastasis in the left adrenal gland region. The patient was treated using a 5 field 3-D conformal technique to a dose of 40 gray at 2.5 gray per fraction.  Narrative: The patient tolerated radiation treatment relatively well.   She did not exhibit any substantial acute toxicity during treatment. No significant ongoing nausea, diarrhea, or other GI issues.  Plan: The patient has completed radiation treatment. The patient will return to radiation oncology clinic for routine followup in one month. I advised the patient to call or return sooner if they have any questions or concerns related to their recovery or treatment. ________________________________  Radene Gunning, M.D., Ph.D.

## 2013-04-15 ENCOUNTER — Other Ambulatory Visit (HOSPITAL_BASED_OUTPATIENT_CLINIC_OR_DEPARTMENT_OTHER): Payer: Medicare Other | Admitting: Lab

## 2013-04-15 DIAGNOSIS — C3430 Malignant neoplasm of lower lobe, unspecified bronchus or lung: Secondary | ICD-10-CM

## 2013-04-15 DIAGNOSIS — R5383 Other fatigue: Secondary | ICD-10-CM

## 2013-04-15 DIAGNOSIS — C342 Malignant neoplasm of middle lobe, bronchus or lung: Secondary | ICD-10-CM

## 2013-04-15 DIAGNOSIS — C3490 Malignant neoplasm of unspecified part of unspecified bronchus or lung: Secondary | ICD-10-CM

## 2013-04-15 DIAGNOSIS — R5381 Other malaise: Secondary | ICD-10-CM

## 2013-04-15 LAB — CBC WITH DIFFERENTIAL/PLATELET
Basophils Absolute: 0 10*3/uL (ref 0.0–0.1)
HCT: 33.9 % — ABNORMAL LOW (ref 34.8–46.6)
HGB: 11.6 g/dL (ref 11.6–15.9)
MONO#: 0.2 10*3/uL (ref 0.1–0.9)
NEUT#: 3.9 10*3/uL (ref 1.5–6.5)
NEUT%: 89 % — ABNORMAL HIGH (ref 38.4–76.8)
WBC: 4.4 10*3/uL (ref 3.9–10.3)
lymph#: 0.2 10*3/uL — ABNORMAL LOW (ref 0.9–3.3)

## 2013-04-15 LAB — COMPREHENSIVE METABOLIC PANEL (CC13)
ALT: 20 U/L (ref 0–55)
Albumin: 3.5 g/dL (ref 3.5–5.0)
BUN: 24.9 mg/dL (ref 7.0–26.0)
CO2: 19 mEq/L — ABNORMAL LOW (ref 22–29)
Calcium: 9.5 mg/dL (ref 8.4–10.4)
Chloride: 101 mEq/L (ref 98–109)
Creatinine: 1.2 mg/dL — ABNORMAL HIGH (ref 0.6–1.1)
Potassium: 3.1 mEq/L — ABNORMAL LOW (ref 3.5–5.1)

## 2013-04-15 NOTE — Progress Notes (Signed)
Quick Note:  Call patient with the result and order K Dur 20 meq po qd X 7 days ______ 

## 2013-04-16 ENCOUNTER — Telehealth: Payer: Self-pay | Admitting: Medical Oncology

## 2013-04-16 DIAGNOSIS — E876 Hypokalemia: Secondary | ICD-10-CM

## 2013-04-16 MED ORDER — POTASSIUM CHLORIDE CRYS ER 20 MEQ PO TBCR: 20.0000 meq | EXTENDED_RELEASE_TABLET | Freq: Every day | ORAL | Status: DC

## 2013-04-16 NOTE — Telephone Encounter (Signed)
Message copied by Charma Igo on Thu Apr 16, 2013 10:09 AM ------      Message from: Si Gaul      Created: Wed Apr 15, 2013  4:37 PM       Call patient with the result and order K Dur 20 meq po qd X 7 days. ------

## 2013-04-16 NOTE — Telephone Encounter (Signed)
Called to pharmacy and pt via voice mail.

## 2013-04-16 NOTE — Telephone Encounter (Signed)
Pt has been admitted to [facility] for weakness .

## 2013-04-22 ENCOUNTER — Ambulatory Visit (HOSPITAL_BASED_OUTPATIENT_CLINIC_OR_DEPARTMENT_OTHER): Payer: Medicare Other

## 2013-04-22 ENCOUNTER — Other Ambulatory Visit (HOSPITAL_BASED_OUTPATIENT_CLINIC_OR_DEPARTMENT_OTHER): Payer: Medicare Other | Admitting: Lab

## 2013-04-22 ENCOUNTER — Encounter (HOSPITAL_COMMUNITY)
Admission: RE | Admit: 2013-04-22 | Discharge: 2013-04-22 | Disposition: A | Discharge status: Home / Self Care | Payer: Medicare Other | Source: Ambulatory Visit | Attending: Internal Medicine | Admitting: Internal Medicine

## 2013-04-22 ENCOUNTER — Other Ambulatory Visit: Payer: Self-pay | Admitting: *Deleted

## 2013-04-22 VITALS — BP 149/71 | HR 74 | Temp 97.7°F | Resp 16

## 2013-04-22 DIAGNOSIS — C3490 Malignant neoplasm of unspecified part of unspecified bronchus or lung: Secondary | ICD-10-CM

## 2013-04-22 DIAGNOSIS — D696 Thrombocytopenia, unspecified: Secondary | ICD-10-CM

## 2013-04-22 DIAGNOSIS — C342 Malignant neoplasm of middle lobe, bronchus or lung: Secondary | ICD-10-CM

## 2013-04-22 LAB — COMPREHENSIVE METABOLIC PANEL (CC13)
ALT: 18 U/L (ref 0–55)
AST: 25 U/L (ref 5–34)
Albumin: 3.5 g/dL (ref 3.5–5.0)
Alkaline Phosphatase: 42 U/L (ref 40–150)
BUN: 16 mg/dL (ref 7.0–26.0)
Calcium: 8.5 mg/dL (ref 8.4–10.4)
Chloride: 104 mEq/L (ref 98–109)
Potassium: 2.8 mEq/L — CL (ref 3.5–5.1)
Sodium: 138 mEq/L (ref 136–145)
Total Protein: 6.5 g/dL (ref 6.4–8.3)

## 2013-04-22 LAB — CBC WITH DIFFERENTIAL/PLATELET
BASO%: 0 % (ref 0.0–2.0)
EOS%: 0.9 % (ref 0.0–7.0)
HCT: 23.6 % — ABNORMAL LOW (ref 34.8–46.6)
HGB: 8.2 g/dL — ABNORMAL LOW (ref 11.6–15.9)
MCHC: 34.7 g/dL (ref 31.5–36.0)
MONO#: 0.2 10*3/uL (ref 0.1–0.9)
NEUT%: 51.8 % (ref 38.4–76.8)
RDW: 19.6 % — ABNORMAL HIGH (ref 11.2–14.5)
WBC: 1.1 10*3/uL — ABNORMAL LOW (ref 3.9–10.3)
lymph#: 0.3 10*3/uL — ABNORMAL LOW (ref 0.9–3.3)

## 2013-04-22 LAB — ABO/RH: ABO/RH(D): O NEG

## 2013-04-22 MED ORDER — ACETAMINOPHEN 325 MG PO TABS
650.0000 mg | ORAL_TABLET | Freq: Once | ORAL | Status: AC
  Administered 2013-04-22: 650 mg via ORAL

## 2013-04-22 MED ORDER — SODIUM CHLORIDE 0.9 % IV SOLN: 250.0000 mL | Freq: Once | INTRAVENOUS | Status: DC

## 2013-04-22 MED ORDER — DIPHENHYDRAMINE HCL 25 MG PO CAPS
25.0000 mg | ORAL_CAPSULE | Freq: Once | ORAL | Status: AC
  Administered 2013-04-22: 25 mg via ORAL

## 2013-04-22 NOTE — Patient Instructions (Addendum)
Neutropenia Neutropenia is a condition that occurs when the level of a certain type of white blood cell (neutrophil) in your body becomes lower than normal. Neutrophils are made in the bone marrow and fight infections. These cells protect against bacteria and viruses. The fewer neutrophils you have, and the longer your body remains without them, the greater your risk of getting a severe infection becomes. CAUSES  The cause of neutropenia may be hard to determine. However, it is usually due to 3 main problems:   Decreased production of neutrophils. This may be due to:  Certain medicines such as chemotherapy.  Genetic problems.  Cancer.  Radiation treatments.  Vitamin deficiency.  Some pesticides.  Increased destruction of neutrophils. This may be due to:  Overwhelming infections.  Hemolytic anemia. This is when the body destroys its own blood cells.  Chemotherapy.  Neutrophils moving to areas of the body where they cannot fight infections. This may be due to:  Dialysis procedures.  Conditions where the spleen becomes enlarged. Neutrophils are held in the spleen and are not available to the rest of the body.  Overwhelming infections. The neutrophils are held in the area of the infection and are not available to the rest of the body. SYMPTOMS  There are no specific symptoms of neutropenia. The lack of neutrophils can result in an infection, and an infection can cause various problems. DIAGNOSIS  Diagnosis is made by a blood test. A complete blood count is performed. The normal level of neutrophils in human blood differs with age and race. Infants have lower counts than older children and adults. African Americans have lower counts than Caucasians or Asians. The average adult level is 1500 cells/mm3 of blood. Neutrophil counts are interpreted as follows:  Greater than 1000 cells/mm3 gives normal protection against infection.  500 to 1000 cells/mm3 gives an increased risk for  infection.  200 to 500 cells/mm3 is a greater risk for severe infection.  Lower than 200 cells/mm3 is a marked risk of infection. This may require hospitalization and treatment with antibiotic medicines. TREATMENT  Treatment depends on the underlying cause, severity, and presence of infections or symptoms. It also depends on your health. Your caregiver will discuss the treatment plan with you. Mild cases are often easily treated and have a good outcome. Preventative measures may also be started to limit your risk of infections. Treatment can include:  Taking antibiotics.  Stopping medicines that are known to cause neutropenia.  Correcting nutritional deficiencies by eating green vegetables to supply folic acid and taking vitamin B supplements.  Stopping exposure to pesticides if your neutropenia is related to pesticide exposure.  Taking a blood growth factor called sargramostim, pegfilgrastim, or filgrastim if you are undergoing chemotherapy for cancer. This stimulates white blood cell production.  Removal of the spleen if you have Felty's syndrome and have repeated infections. HOME CARE INSTRUCTIONS   Follow your caregiver's instructions about when you need to have blood work done.  Wash your hands often. Make sure others who come in contact with you also wash their hands.  Wash raw fruits and vegetables before eating them. They can carry bacteria and fungi.  Avoid people with colds or spreadable (contagious) diseases (chickenpox, herpes zoster, influenza).  Avoid large crowds.  Avoid construction areas. The dust can release fungus into the air.  Be cautious around children in daycare or school environments.  Take care of your respiratory system by coughing and deep breathing.  Bathe daily.  Protect your skin from cuts and   Annisa Mazzarella.  Do not work in the garden or with flowers and plants.  Care for the mouth before and after meals by brushing with a soft toothbrush. If you have  mucositis, do not use mouthwash. Mouthwash contains alcohol and can dry out the mouth even more.  Clean the area between the genitals and the anus (perineal area) after urination and bowel movements. Women need to wipe from front to back.  Use a water soluble lubricant during sexual intercourse and practice good hygiene after. Do not have intercourse if you are severely neutropenic. Check with your caregiver for guidelines.  Exercise daily as tolerated.  Avoid people who were vaccinated with a live vaccine in the past 30 days. You should not receive live vaccines (polio, typhoid).  Do not provide direct care for pets. Avoid animal droppings. Do not clean litter boxes and bird cages.  Do not share food utensils.  Do not use tampons, enemas, or rectal suppositories unless directed by your caregiver.  Use an electric razor to remove hair.  Wash your hands after handling magazines, letters, and newspapers. SEEK IMMEDIATE MEDICAL CARE IF:   You have a fever.  You have chills or start to shake.  You feel nauseous or vomit.  You develop mouth sores.  You develop aches and pains.  You have redness and swelling around open wounds.  Your skin is warm to the touch.  You have pus coming from your wounds.  You develop swollen lymph nodes.  You feel weak or fatigued.  You develop red streaks on the skin. MAKE SURE YOU:  Understand these instructions.  Will watch your condition.  Will get help right away if you are not doing well or get worse. Document Released: 03/16/2002 Document Revised: 12/17/2011 Document Reviewed: 04/13/2011 [facility] Patient Information 2014 [place], [place].     Hypokalemia Hypokalemia means a low potassium level in the blood. Symptoms may include muscle weakness and cramping, fatigue, abdominal pain, vomiting, constipation, or irregularities of the heartbeat. Sometimes hypokalemia is discovered by your caregiver if you are taking certain medicines for  high blood pressure or kidney disease.  Potassium is an electrolyte that helps regulate the amount of fluid in the body. It also stimulates muscle contraction and maintains a stable acid-base balance. If potassium levels go too low or too high, your health may be in danger. You are at risk for developing shock, heart, and lung problems. Hypokalemia can occur if you have excessive diarrhea, vomiting, or sweating. Potassium can be lost through your kidneys in the urine. Certain common medicines can also cause potassium loss, especially water pills (diuretics). The same is possible with cortisone medications or certain types of antibiotics. Low potassium can be dangerous if you are taking certain heart medicines. In diabetes, your potassium may fall after you take insulin, especially if your diabetes had been out of control for a while. In rare cases, potassium may be low because you are not getting enough in your diet.  In adults, a potassium level below 3.5 mEq/L is usually considered low. Hypokalemia can be treated with potassium supplements taken by mouth and a diet that is high in potassium. Foods with high potassium content are:  Peas, lentils, lima beans, nuts, and dried fruit.  Whole grain and bran cereals and breads.  Fresh fruit and vegetables. Examples include:  Bananas.  Cantaloupe.  Grapefruit.  Oranges.  Tomatoes.  Honeydew melons.  Potatoes.  Peaches.  Orange and tomato juices.  Meats. See your caregiver as instructed for  a follow-up blood test to be sure your potassium is back to normal. SEEK MEDICAL CARE IF:   You have nausea, vomiting, constipation, or abdominal pain.  You have palpitations or irregular heartbeats, chest pain or shortness of breath.  You have muscle cramps or weakness or fatigue.  You have lethargy. SEEK IMMEDIATE MEDICAL CARE IF:   You have paralysis.  You have confusion or other mental status changes. Document Released: 09/24/2005  Document Revised: 12/17/2011 Document Reviewed: 01/18/2010 [facility] Patient Information 2014 [place], [place]. Platelet Transfusion Information This is information about transfusions of platelets. Platelets are tiny cells made by the bone marrow and found in the blood. When a blood vessel is damaged platelets rush to the damaged area to help form a clot. This begins the healing process. When platelets get very low your blood may have trouble clotting. This may be from:  Illness.  Blood disorder.  Chemotherapy to treat cancer. Often lower platelet counts do not usually cause problems.  Platelets usually last for 7 to 10 days. If they are not used not used in an injury, they are broken down by the liver or spleen. Symptoms of low platelet count include:  Nosebleeds.  Bleeding gums.  Heavy periods.  Bruising and tiny blood spots in the skin.  Pin point spots of bleeding are called (petechiae).  Larger bruises (purpura).  Bleeding can be more serious if it happens in the brain or bowel. Platelet transfusions are often used to keep the platelet count at an acceptable level. Serious bleeding due to low platelets is uncommon. RISKS AND COMPLICATIONS Severe side effects from platelet transfusions are uncommon. Minor reactions may include:  Itching.  Rashes.  High temperature and shivering. Medications are available to stop transfusion reactions. Let your caregivers know if you develop any of the above problems.  If you are having platelet transfusions frequently they may get less effective. This is called becoming refractory to platelets. It is uncommon. This can happen from non-immune causes and immune causes. Non-immune causes include:  High temperatures.  Some medications.  An enlarged spleen. Immune causes happen when your body discovers the platelets are not your own and begin making antibodies against them. The antibodies kill the platelets quickly. Even with platelet  transfusions you may still notice problems with bleeding or bruising. Let your caregivers know about this. Other things can be done to help if this happens.  BEFORE THE PROCEDURE   Your doctors will check your platelet count regularly.  If the platelet count is too low it may be necessary to have a platelet transfusion.  This is more important before certain procedures with a risk of bleeding such as a spinal tap.  Platelet transfusion reduces the risk of bleeding during or after the procedure.  Except in emergencies, giving a transfusion requires a written consent. Before blood is taken from a donor, a complete history is taken to make sure the person has no history of previous diseases, nor engages in risky social behavior. Examples of this are intravenous drug use or sexual activity with multiple partners. This could lead to infected blood or blood products being used. This history is done even in spite of the extensive testing to make sure the blood is safe. All blood products transfused are tested to make sure it is a match for the person getting the blood. It is also checked for infections. Blood is the safest it has ever been. The risk of getting an infection is very low. PROCEDURE  The  platelets are stored in small plastic bags which are kept at a low temperature.  Each bag is called a unit and sometimes two units are given. They are given through an intravenous line by drip infusion over about one half hour.  Usually blood is collected from multiple people to get enough to transfuse.  Sometimes, the platelets are collected from a single person. This is done using a special machine that separates the platelets from the blood. The machine is called an apheresis machine. Platelets collected in this way are called apheresed platelets. Apheresed platelets reduce the risk of becoming sensitive to the platelets. This lowers the chances of having a transfusion reaction.  As it only takes a  short time to give the platelets, this treatment can be given in an outpatients department. Platelets can also be given before or after other treatments. SEEK IMMEDIATE MEDICAL CARE IF: Any of the following symptoms over the next 12 hours or several days:  Shaking chills.  Fever with a temperature greater than 102 F (38.9 C) develops.  Back pain or muscle pain.  People around you feel you are not acting correctly, or you are confused.  Blood in the urine or bowel movements or bleeding from any place in your body.  Shortness of breath, or difficulty breathing.  Dizziness.  Fainting.  You break out in a rash or develop hives.  You have a decrease in the amount of urine you are putting out, or the urine turns a dark color or changes to pink, red, or brown.  A severe headache or stiff neck.  Bruising more easily. Document Released: 07/22/2007 Document Revised: 12/17/2011 Document Reviewed: 07/22/2007 [facility] Patient Information 2014 [place], [place].

## 2013-04-22 NOTE — Progress Notes (Signed)
Weekly lab work: plts 3, ANC 0.6, WBC 1.1, K 2.8.  Per Dr Donnald Garre, pt needs 1 unit plts, neutropenic pxns, and x 7 days of K-dur.  Pt is arranged to receive plts today, will discuss other instructions with pt when she arrives for her plt transfusion.  SLJ

## 2013-04-22 NOTE — Progress Notes (Signed)
Spoke to [name] and her caregiver in the infusion room.  [name] is aware to take K-dur x 7 days.  Order faxed to nursing facility where [name] resides fax # [phone].  Also instructed [name] regarding neutropenic pxns.  She verbalized understanding.  SLJ

## 2013-04-23 LAB — PREPARE PLATELET PHERESIS

## 2013-04-27 ENCOUNTER — Encounter (HOSPITAL_COMMUNITY): Payer: Self-pay | Admitting: Emergency Medicine

## 2013-04-27 ENCOUNTER — Inpatient Hospital Stay (HOSPITAL_COMMUNITY): Payer: Medicare Other

## 2013-04-27 ENCOUNTER — Inpatient Hospital Stay (HOSPITAL_COMMUNITY)
Admission: EM | Admit: 2013-04-27 | Discharge: 2013-04-30 | DRG: 809 | Disposition: A | Discharge status: Nursing Home (Includes ICF) | Payer: Medicare Other | Attending: Internal Medicine | Admitting: Internal Medicine

## 2013-04-27 DIAGNOSIS — D72829 Elevated white blood cell count, unspecified: Secondary | ICD-10-CM

## 2013-04-27 DIAGNOSIS — C7931 Secondary malignant neoplasm of brain: Secondary | ICD-10-CM

## 2013-04-27 DIAGNOSIS — C342 Malignant neoplasm of middle lobe, bronchus or lung: Secondary | ICD-10-CM

## 2013-04-27 DIAGNOSIS — R5381 Other malaise: Secondary | ICD-10-CM | POA: Diagnosis present

## 2013-04-27 DIAGNOSIS — D61810 Antineoplastic chemotherapy induced pancytopenia: Principal | ICD-10-CM

## 2013-04-27 DIAGNOSIS — T7840XA Allergy, unspecified, initial encounter: Secondary | ICD-10-CM

## 2013-04-27 DIAGNOSIS — R262 Difficulty in walking, not elsewhere classified: Secondary | ICD-10-CM | POA: Diagnosis present

## 2013-04-27 DIAGNOSIS — R5081 Fever presenting with conditions classified elsewhere: Secondary | ICD-10-CM | POA: Diagnosis present

## 2013-04-27 DIAGNOSIS — R918 Other nonspecific abnormal finding of lung field: Secondary | ICD-10-CM

## 2013-04-27 DIAGNOSIS — D709 Neutropenia, unspecified: Secondary | ICD-10-CM | POA: Diagnosis present

## 2013-04-27 DIAGNOSIS — I2510 Atherosclerotic heart disease of native coronary artery without angina pectoris: Secondary | ICD-10-CM

## 2013-04-27 DIAGNOSIS — C3490 Malignant neoplasm of unspecified part of unspecified bronchus or lung: Secondary | ICD-10-CM

## 2013-04-27 DIAGNOSIS — E875 Hyperkalemia: Secondary | ICD-10-CM

## 2013-04-27 DIAGNOSIS — E663 Overweight: Secondary | ICD-10-CM

## 2013-04-27 DIAGNOSIS — R571 Hypovolemic shock: Secondary | ICD-10-CM

## 2013-04-27 DIAGNOSIS — R739 Hyperglycemia, unspecified: Secondary | ICD-10-CM

## 2013-04-27 DIAGNOSIS — E162 Hypoglycemia, unspecified: Secondary | ICD-10-CM

## 2013-04-27 DIAGNOSIS — R112 Nausea with vomiting, unspecified: Secondary | ICD-10-CM

## 2013-04-27 DIAGNOSIS — M25519 Pain in unspecified shoulder: Secondary | ICD-10-CM

## 2013-04-27 DIAGNOSIS — E1110 Type 2 diabetes mellitus with ketoacidosis without coma: Secondary | ICD-10-CM

## 2013-04-27 DIAGNOSIS — Z87891 Personal history of nicotine dependence: Secondary | ICD-10-CM

## 2013-04-27 DIAGNOSIS — R42 Dizziness and giddiness: Secondary | ICD-10-CM | POA: Diagnosis present

## 2013-04-27 DIAGNOSIS — I495 Sick sinus syndrome: Secondary | ICD-10-CM

## 2013-04-27 DIAGNOSIS — J45901 Unspecified asthma with (acute) exacerbation: Secondary | ICD-10-CM

## 2013-04-27 DIAGNOSIS — K257 Chronic gastric ulcer without hemorrhage or perforation: Secondary | ICD-10-CM

## 2013-04-27 DIAGNOSIS — J449 Chronic obstructive pulmonary disease, unspecified: Secondary | ICD-10-CM

## 2013-04-27 DIAGNOSIS — J441 Chronic obstructive pulmonary disease with (acute) exacerbation: Secondary | ICD-10-CM

## 2013-04-27 DIAGNOSIS — I480 Paroxysmal atrial fibrillation: Secondary | ICD-10-CM

## 2013-04-27 DIAGNOSIS — C7949 Secondary malignant neoplasm of other parts of nervous system: Secondary | ICD-10-CM

## 2013-04-27 DIAGNOSIS — Z950 Presence of cardiac pacemaker: Secondary | ICD-10-CM

## 2013-04-27 DIAGNOSIS — F05 Delirium due to known physiological condition: Secondary | ICD-10-CM

## 2013-04-27 DIAGNOSIS — C7970 Secondary malignant neoplasm of unspecified adrenal gland: Secondary | ICD-10-CM

## 2013-04-27 DIAGNOSIS — I4891 Unspecified atrial fibrillation: Secondary | ICD-10-CM | POA: Diagnosis present

## 2013-04-27 DIAGNOSIS — Z7901 Long term (current) use of anticoagulants: Secondary | ICD-10-CM

## 2013-04-27 DIAGNOSIS — E876 Hypokalemia: Secondary | ICD-10-CM

## 2013-04-27 DIAGNOSIS — E785 Hyperlipidemia, unspecified: Secondary | ICD-10-CM

## 2013-04-27 DIAGNOSIS — T451X5A Adverse effect of antineoplastic and immunosuppressive drugs, initial encounter: Principal | ICD-10-CM | POA: Diagnosis present

## 2013-04-27 DIAGNOSIS — K589 Irritable bowel syndrome without diarrhea: Secondary | ICD-10-CM

## 2013-04-27 DIAGNOSIS — E871 Hypo-osmolality and hyponatremia: Secondary | ICD-10-CM

## 2013-04-27 DIAGNOSIS — R270 Ataxia, unspecified: Secondary | ICD-10-CM

## 2013-04-27 DIAGNOSIS — F17200 Nicotine dependence, unspecified, uncomplicated: Secondary | ICD-10-CM

## 2013-04-27 DIAGNOSIS — D696 Thrombocytopenia, unspecified: Secondary | ICD-10-CM

## 2013-04-27 DIAGNOSIS — I10 Essential (primary) hypertension: Secondary | ICD-10-CM

## 2013-04-27 DIAGNOSIS — D649 Anemia, unspecified: Secondary | ICD-10-CM

## 2013-04-27 DIAGNOSIS — R0602 Shortness of breath: Secondary | ICD-10-CM

## 2013-04-27 DIAGNOSIS — T380X5A Adverse effect of glucocorticoids and synthetic analogues, initial encounter: Secondary | ICD-10-CM

## 2013-04-27 DIAGNOSIS — D61818 Other pancytopenia: Secondary | ICD-10-CM

## 2013-04-27 LAB — CBC WITH DIFFERENTIAL/PLATELET
Basophils Absolute: 0 10*3/uL (ref 0.0–0.1)
Eosinophils Absolute: 0 10*3/uL (ref 0.0–0.7)
HCT: 19 % — ABNORMAL LOW (ref 36.0–46.0)
Lymphocytes Relative: 33 % (ref 12–46)
MCHC: 35.3 g/dL (ref 30.0–36.0)
Neutro Abs: 0.5 10*3/uL — ABNORMAL LOW (ref 1.7–7.7)
Neutrophils Relative %: 37 % — ABNORMAL LOW (ref 43–77)
Platelets: 12 10*3/uL — CL (ref 150–400)
RDW: 19.5 % — ABNORMAL HIGH (ref 11.5–15.5)

## 2013-04-27 LAB — URINALYSIS, ROUTINE W REFLEX MICROSCOPIC
Leukocytes, UA: NEGATIVE
Protein, ur: 100 mg/dL — AB
Urobilinogen, UA: 1 mg/dL (ref 0.0–1.0)

## 2013-04-27 LAB — COMPREHENSIVE METABOLIC PANEL
AST: 22 U/L (ref 0–37)
CO2: 20 mEq/L (ref 19–32)
Calcium: 8.8 mg/dL (ref 8.4–10.5)
Creatinine, Ser: 1.06 mg/dL (ref 0.50–1.10)
GFR calc Af Amer: 63 mL/min — ABNORMAL LOW (ref >90)
GFR calc non Af Amer: 55 mL/min — ABNORMAL LOW (ref >90)

## 2013-04-27 LAB — URINE MICROSCOPIC-ADD ON

## 2013-04-27 LAB — PREPARE RBC (CROSSMATCH)

## 2013-04-27 LAB — GLUCOSE, CAPILLARY: Glucose-Capillary: 140 mg/dL — ABNORMAL HIGH (ref 70–99)

## 2013-04-27 MED ORDER — DEXTROSE 5 % IV SOLN
2.0000 g | INTRAVENOUS | Status: AC
  Administered 2013-04-27: 2 g via INTRAVENOUS
  Filled 2013-04-27: qty 2

## 2013-04-27 MED ORDER — ONDANSETRON HCL 4 MG PO TABS: 4.0000 mg | ORAL_TABLET | Freq: Four times a day (QID) | ORAL | Status: DC | PRN

## 2013-04-27 MED ORDER — ONDANSETRON HCL 4 MG/2ML IJ SOLN
4.0000 mg | Freq: Four times a day (QID) | INTRAMUSCULAR | Status: DC | PRN
  Administered 2013-04-30: 4 mg via INTRAVENOUS
  Filled 2013-04-27: qty 2

## 2013-04-27 MED ORDER — SODIUM CHLORIDE 0.9 % IV BOLUS (SEPSIS)
2000.0000 mL | Freq: Once | INTRAVENOUS | Status: AC
  Administered 2013-04-27: 2000 mL via INTRAVENOUS

## 2013-04-27 MED ORDER — ACETAMINOPHEN 650 MG RE SUPP: 650.0000 mg | Freq: Four times a day (QID) | RECTAL | Status: DC | PRN

## 2013-04-27 MED ORDER — ATORVASTATIN CALCIUM 80 MG PO TABS
80.0000 mg | ORAL_TABLET | Freq: Every day | ORAL | Status: DC
  Administered 2013-04-27 – 2013-04-29 (×3): 80 mg via ORAL
  Filled 2013-04-27 (×4): qty 1

## 2013-04-27 MED ORDER — OXYCODONE-ACETAMINOPHEN 10-325 MG PO TABS: 1.0000 | ORAL_TABLET | Freq: Three times a day (TID) | ORAL | Status: DC | PRN

## 2013-04-27 MED ORDER — ASPIRIN EC 81 MG PO TBEC
81.0000 mg | DELAYED_RELEASE_TABLET | Freq: Every day | ORAL | Status: DC
  Administered 2013-04-28 – 2013-04-30 (×3): 81 mg via ORAL
  Filled 2013-04-27 (×3): qty 1

## 2013-04-27 MED ORDER — METOCLOPRAMIDE HCL 5 MG/ML IJ SOLN
5.0000 mg | Freq: Once | INTRAMUSCULAR | Status: AC
  Administered 2013-04-27: 5 mg via INTRAVENOUS
  Filled 2013-04-27: qty 2

## 2013-04-27 MED ORDER — OXYCODONE HCL 5 MG PO TABS: 5.0000 mg | ORAL_TABLET | Freq: Three times a day (TID) | ORAL | Status: DC | PRN

## 2013-04-27 MED ORDER — DEXTROSE 5 % IV SOLN
2.0000 g | Freq: Three times a day (TID) | INTRAVENOUS | Status: DC
  Administered 2013-04-27 – 2013-04-30 (×8): 2 g via INTRAVENOUS
  Filled 2013-04-27 (×10): qty 2

## 2013-04-27 MED ORDER — FOLIC ACID 1 MG PO TABS
1.0000 mg | ORAL_TABLET | Freq: Every day | ORAL | Status: DC
  Administered 2013-04-28 – 2013-04-30 (×3): 1 mg via ORAL
  Filled 2013-04-27 (×3): qty 1

## 2013-04-27 MED ORDER — ALBUTEROL SULFATE (5 MG/ML) 0.5% IN NEBU: 2.5000 mg | INHALATION_SOLUTION | RESPIRATORY_TRACT | Status: DC | PRN

## 2013-04-27 MED ORDER — FLECAINIDE ACETATE 100 MG PO TABS
100.0000 mg | ORAL_TABLET | Freq: Two times a day (BID) | ORAL | Status: DC
Start: 2013-04-27 — End: 2013-04-30
  Administered 2013-04-27 – 2013-04-30 (×6): 100 mg via ORAL
  Filled 2013-04-27 (×7): qty 1

## 2013-04-27 MED ORDER — FILGRASTIM 300 MCG/ML IJ SOLN
300.0000 ug | Freq: Every day | INTRAMUSCULAR | Status: DC
  Administered 2013-04-27 – 2013-04-28 (×2): 300 ug via SUBCUTANEOUS
  Filled 2013-04-27 (×3): qty 1

## 2013-04-27 MED ORDER — MORPHINE SULFATE 4 MG/ML IJ SOLN: 4.0000 mg | INTRAMUSCULAR | Status: DC | PRN

## 2013-04-27 MED ORDER — DIPHENHYDRAMINE HCL 50 MG/ML IJ SOLN
25.0000 mg | Freq: Once | INTRAMUSCULAR | Status: AC
  Administered 2013-04-27: 25 mg via INTRAVENOUS
  Filled 2013-04-27: qty 1

## 2013-04-27 MED ORDER — ACETAMINOPHEN 325 MG PO TABS: 650.0000 mg | ORAL_TABLET | Freq: Four times a day (QID) | ORAL | Status: DC | PRN

## 2013-04-27 MED ORDER — SODIUM CHLORIDE 0.9 % IV SOLN: INTRAVENOUS | Status: DC

## 2013-04-27 MED ORDER — POTASSIUM CHLORIDE CRYS ER 20 MEQ PO TBCR
40.0000 meq | EXTENDED_RELEASE_TABLET | Freq: Once | ORAL | Status: AC
  Administered 2013-04-27: 40 meq via ORAL
  Filled 2013-04-27: qty 2

## 2013-04-27 MED ORDER — TRAZODONE HCL 100 MG PO TABS
100.0000 mg | ORAL_TABLET | Freq: Every day | ORAL | Status: DC
  Administered 2013-04-27 – 2013-04-29 (×3): 100 mg via ORAL
  Filled 2013-04-27 (×4): qty 1

## 2013-04-27 MED ORDER — NEBIVOLOL HCL 2.5 MG PO TABS
2.5000 mg | ORAL_TABLET | Freq: Every day | ORAL | Status: DC
  Administered 2013-04-28 – 2013-04-30 (×3): 2.5 mg via ORAL
  Filled 2013-04-27 (×3): qty 1

## 2013-04-27 MED ORDER — OXYCODONE-ACETAMINOPHEN 5-325 MG PO TABS: 1.0000 | ORAL_TABLET | Freq: Three times a day (TID) | ORAL | Status: DC | PRN

## 2013-04-27 MED ORDER — ALBUTEROL SULFATE HFA 108 (90 BASE) MCG/ACT IN AERS
2.0000 | INHALATION_SPRAY | Freq: Four times a day (QID) | RESPIRATORY_TRACT | Status: DC | PRN
  Filled 2013-04-27: qty 6.7

## 2013-04-27 MED ORDER — MECLIZINE HCL 25 MG PO TABS
25.0000 mg | ORAL_TABLET | Freq: Every day | ORAL | Status: DC
  Administered 2013-04-28 – 2013-04-30 (×3): 25 mg via ORAL
  Filled 2013-04-27 (×3): qty 1

## 2013-04-27 MED ORDER — PROCHLORPERAZINE MALEATE 10 MG PO TABS: 10.0000 mg | ORAL_TABLET | Freq: Four times a day (QID) | ORAL | Status: DC | PRN

## 2013-04-27 MED ORDER — POTASSIUM CHLORIDE CRYS ER 20 MEQ PO TBCR
20.0000 meq | EXTENDED_RELEASE_TABLET | Freq: Two times a day (BID) | ORAL | Status: DC
  Administered 2013-04-27 – 2013-04-30 (×6): 20 meq via ORAL
  Filled 2013-04-27 (×7): qty 1

## 2013-04-27 MED ORDER — IPRATROPIUM BROMIDE 0.02 % IN SOLN: 500.0000 ug | Freq: Four times a day (QID) | RESPIRATORY_TRACT | Status: DC | PRN

## 2013-04-27 MED ORDER — ALUM & MAG HYDROXIDE-SIMETH 200-200-20 MG/5ML PO SUSP
30.0000 mL | Freq: Four times a day (QID) | ORAL | Status: DC | PRN
  Administered 2013-04-30: 30 mL via ORAL
  Filled 2013-04-27: qty 30

## 2013-04-27 MED ORDER — POLYETHYLENE GLYCOL 3350 17 G PO PACK
17.0000 g | PACK | Freq: Every day | ORAL | Status: DC | PRN
  Filled 2013-04-27: qty 1

## 2013-04-27 MED ORDER — LORAZEPAM 2 MG/ML IJ SOLN
1.0000 mg | Freq: Once | INTRAMUSCULAR | Status: AC
  Administered 2013-04-27: 1 mg via INTRAVENOUS
  Filled 2013-04-27: qty 1

## 2013-04-27 MED ORDER — POTASSIUM CHLORIDE IN NACL 40-0.9 MEQ/L-% IV SOLN
INTRAVENOUS | Status: DC
  Administered 2013-04-27 – 2013-04-30 (×5): via INTRAVENOUS
  Filled 2013-04-27 (×8): qty 1000

## 2013-04-27 NOTE — ED Notes (Signed)
Patient receiving fluids. Advised will attempt to get urine sample in next 5-10 mins. RN Fleet Contras at bedside for conversation.

## 2013-04-27 NOTE — Progress Notes (Signed)
ANTIBIOTIC CONSULT NOTE - INITIAL  Pharmacy Consult for Cefepime Indication: Neutropenia  Allergies  Allergen Reactions  . Latex     "pulls skin off"    Patient Measurements: Wt 66.8kg on 04/08/13  Vital Signs: Temp: 98.3 F (36.8 C) (07/21 1052) Temp src: Oral (07/21 1052) BP: 153/82 mmHg (07/21 1402) Pulse Rate: 84 (07/21 1402) Intake/Output from previous day:   Intake/Output from this shift:    Labs:  Recent Labs  04/27/13 1145  WBC 1.4*  HGB 6.7*  PLT 12*  CREATININE 1.06   CrC; ~60 ml/min/1.53m2 (normalized)  Microbiology: Recent Results (from the past 720 hour(s))  TECHNOLOGIST REVIEW     Status: None   Collection Time    04/08/13  8:20 AM      Result Value Range Status   Technologist Review Metas and Myelocytes present   Final  7/21 blood x2: sent  Medical History: Past Medical History  Diagnosis Date  . Atrial fibrillation   . Coronary atherosclerosis of native coronary artery     Nonobstructive, LVEF 60%  . COPD (chronic obstructive pulmonary disease)   . Mixed hyperlipidemia   . Essential hypertension, benign   . Tachycardia-bradycardia syndrome   . Embolism - blood clot     PE  . SOB (shortness of breath) on exertion   . Allergy     Latex  . Asthma   . Anxiety   . Myocardial infarction     12 years ago ?  [name] History of radiation therapy     59 gy fraction 5 eot 07/09/12 right lung  . Lung cancer 04/29/12 dx    RLL needle bx=invasive well diff adenocarcinoma  [facility]  . Adrenal insufficiency     iatrogenic from dexamethasone     Assessment: 41 yof with met NSCLC s/p chemo on 7/2 who prresented to ED with N/V. Critical labs include WBC 1.4, Hgb 6.7, Pltc 12K. Begin empiric cefepime for neutropenia and shivering.  Goal of Therapy:  Dose per renal function  Plan:   Cefepime 2g IV q8h Follow up renal function & cultures  Loralee Pacas, PharmD, BCPS Pager: [phone] 04/27/2013,2:34 PM

## 2013-04-27 NOTE — H&P (Addendum)
[facility] History and Physical  [name] RUE:[id number] DOB: May 09, 1951 DOA: 04/27/2013  Referring physician: Dr. Einar Grad PCP: Kari Conrad, MD   Chief Complaint: Nausea and vomiting, weakness   History of Present Illness: [name] is an 62 y.o. female with a PMH of atrial fibrillation, CAD, COPD, HTN, metastatic non-small cell lung cancer initially diagnosed as synchronous primary non-small cell lung cancer, adenocarcinoma with negative EGFR mutation and negative ALK gene translocation involving the right middle lobe and lower lobe diagnosed in July of 2013 status post stereotactic radiotherapy to right lung lesions completed on 07/09/2012; whole brain irradiation completed on 12/04/2012 and palliative radiotherapy to the left adrenal gland completed on 03/13/13.  Also s/p 5 cycles of Alimta and carboplatin Q 3 weeks under the care of Dr. Arbutus Ped.  Has had several days of nausea and vomiting.  No associated diarrhea.  Has had subjective fever and chills.  The patient cannot tell me when she had her last chemotherapy. There are no aggravating or alleviating factors.    Review of Systems: Constitutional: + fever, + chills;  Appetite poor; + weight loss, no weight gain, no fatigue.  HEENT: No blurry vision, no diplopia, no pharyngitis, no dysphagia CV: No chest pain, no palpitations.  Resp: + SOB, + cough with white sputum GI: + nausea, + vomiting, no diarrhea, no melena, no hematochezia.  GU: No dysuria, no hematuria. MSK: no myalgias, no arthralgias.  Neuro:  No headache, no focal neurological deficits, no history of seizures.  Psych: + depression, no anxiety.  Endo: No heat intolerance, no cold intolerance, no polyuria, no polydipsia  Skin: No rashes, no skin lesions.  Heme: + easy bruising.  Past Medical History Past Medical History  Diagnosis Date  . Atrial fibrillation   . Coronary atherosclerosis of native coronary artery     Nonobstructive, LVEF 60%  . COPD (chronic  obstructive pulmonary disease)   . Mixed hyperlipidemia   . Essential hypertension, benign   . Tachycardia-bradycardia syndrome   . Embolism - blood clot     PE  . SOB (shortness of breath) on exertion   . Allergy     Latex  . Asthma   . Anxiety   . Myocardial infarction     12 years ago ?  [name] History of radiation therapy     59 gy fraction 5 eot 07/09/12 right lung  . Lung cancer 04/29/12 dx    RLL needle bx=invasive well diff adenocarcinoma  nscca  . Adrenal insufficiency     iatrogenic from dexamethasone     Past Surgical History Past Surgical History  Procedure Laterality Date  . Appendectomy    . Insert / replace / remove pacemaker  3/11     St. Jude - Dr. [name]  . Video bronchoscopy  02/22/2012    Procedure: VIDEO BRONCHOSCOPY WITH FLUORO;  Surgeon: Nyoka Cowden, MD;  Location: [facility];  Service: Cardiopulmonary;  Laterality: Bilateral;  . Lung biopsy  04/29/12    RLL invasive well diff adenocarcinoma  . Lung biopsy  02/22/12    RML=benign lung parenchyma,no tumor sen  . Tonsillectomy and adenoidectomy      age 8  . Total abdominal hysterectomy  1975    1/2 right ovary intact, left salpingo-oppherctomy  . Mr brain w wo contrast  11/17/2012       . Esophagogastroduodenoscopy N/A 01/15/2013    Procedure: ESOPHAGOGASTRODUODENOSCOPY (EGD);  Surgeon: Corbin Ade, MD;  Location: [facility];  Service: Endoscopy;  Laterality: N/A;     Social History: History   Social History  . Marital Status: Widowed    Spouse Name: N/A    Number of Children: 1  . Years of Education: N/A   Occupational History  . Unemployed    Social History Main Topics  . Smoking status: Former Smoker -- 0.25 packs/day for 43 years    Types: Cigarettes    Quit date: 10/08/2012  . Smokeless tobacco: Former Neurosurgeon     Comment: quit 2 weeks ago after smoking since age 56. Up to 1 pack a day  . Alcohol Use: No  . Drug Use: No     Comment: will quit labor day stated  patient 06/09/12  .  Sexually Active: No     Comment: menses age 17, first pregnamcy age 61, HRT 4-5 years,premarin,stopped 2000 or 2001   Other Topics Concern  . Not on file   Social History Narrative   Widowed.  Lives with her father who is in his 16's.  Ambulates with a walker.    Family History:  Family History  Problem Relation Age of Onset  . Hypertension Mother   . Emphysema Brother     smoker  . Asthma Father   . Asthma Brother   . Lung cancer Paternal Grandmother     was a smoker  . Cancer Maternal Grandmother     lung, dx early 79's, died 3 days after lobectomy    Allergies: Latex  Meds: Prior to Admission medications   Medication Sig Start Date End Date Taking? Authorizing Provider  albuterol (PROAIR HFA) 108 (90 BASE) MCG/ACT inhaler Inhale 2 puffs into the lungs every 6 (six) hours as needed for wheezing or shortness of breath. 01/20/13  Yes Hollice Espy, MD  albuterol (PROVENTIL) (2.5 MG/3ML) 0.083% nebulizer solution Take 3 mLs (2.5 mg total) by nebulization every 6 (six) hours as needed for wheezing or shortness of breath. 01/20/13  Yes Hollice Espy, MD  aspirin EC 81 MG tablet Take 81 mg by mouth daily.   Yes Historical Provider, MD  flecainide (TAMBOCOR) 100 MG tablet Take 1 tablet (100 mg total) by mouth 2 (two) times daily. 01/20/13  Yes Hollice Espy, MD  folic acid (FOLVITE) 1 MG tablet Take 1 tablet (1 mg total) by mouth daily. 01/20/13  Yes Hollice Espy, MD  glucose 4 GM chewable tablet Chew 4 tablets (16 g total) by mouth as needed for low blood sugar. 01/20/13  Yes Hollice Espy, MD  ipratropium (ATROVENT) 0.02 % nebulizer solution Take 2.5 mLs (500 mcg total) by nebulization 4 (four) times daily as needed for wheezing (shortness of breath). 01/20/13  Yes Hollice Espy, MD  meclizine (ANTIVERT) 25 MG tablet Take 25 mg by mouth daily.    Yes Historical Provider, MD  MELATONIN ER PO Take 5 mg by mouth at bedtime.    Yes Historical Provider, MD  nebivolol  (BYSTOLIC) 2.5 MG tablet Take 2.5 mg by mouth daily. 01/20/13  Yes Hollice Espy, MD  oxyCODONE-acetaminophen (PERCOCET) 10-325 MG per tablet Take 1 tablet by mouth every 8 (eight) hours as needed for pain.   Yes Historical Provider, MD  potassium chloride SA (K-DUR,KLOR-CON) 20 MEQ tablet Take 1 tablet (20 mEq total) by mouth daily. 04/16/13  Yes Si Gaul, MD  PRESCRIPTION MEDICATION Chemo: Carbo/Alimta [phone] per Dr Arbutus Ped for lung ca; Cycle 5 7/2, Cycle 6 due 7/23   Yes Historical Provider, MD  prochlorperazine (COMPAZINE) 10 MG tablet Take 1 tablet (10 mg total) by mouth every 6 (six) hours as needed. 03/27/13  Yes Jonna Coup, MD  rosuvastatin (CRESTOR) 40 MG tablet Take 40 mg by mouth daily. 01/20/13  Yes Hollice Espy, MD  traZODone (DESYREL) 100 MG tablet Take 100 mg by mouth at bedtime.   Yes Historical Provider, MD    Physical Exam: Filed Vitals:   04/27/13 1052 04/27/13 1402  BP: 136/93 153/82  Pulse: 104 84  Temp: 98.3 F (36.8 C)   TempSrc: Oral   Resp: 16 16  SpO2: 100% 100%     Physical Exam: Blood pressure 153/82, pulse 84, temperature 98.3 F (36.8 C), temperature source Oral, resp. rate 16, SpO2 100.00%. Gen: No acute distress.  Rigors noted. Head: Normocephalic, atraumatic. Eyes: PERRL, EOMI, sclerae nonicteric. Mouth: Oropharynx clear.  Dentures upper, fair dentition.  No mucositis. Neck: Supple, no thyromegaly, no lymphadenopathy, no jugular venous distention. Chest: Lungs diminished throughout. CV: Heart sounds are tachycardic, no murmurs, rubs, or gallops. Abdomen: Soft, nontender, nondistended with normal active bowel sounds. Extremities: Extremities are with trace edema bilaterally. Skin: Warm and dry. Neuro: Alert and oriented times 2; cranial nerves II through XII grossly intact. Psych: Mood and affect depressed.  Labs on Admission:  Basic Metabolic Panel:  Recent Labs Lab 04/22/13 0906 04/27/13 1145  NA 138 137  K 2.8  Repeated and Verified* 3.0*  CL  --  103  CO2 18* 20  GLUCOSE 99 118*  BUN 16.0 12  CREATININE 1.1 1.06  CALCIUM 8.5 8.8   Liver Function Tests:  Recent Labs Lab 04/22/13 0906 04/27/13 1145  AST 25 22  ALT 18 12  ALKPHOS 42 47  BILITOT 0.92 0.6  PROT 6.5 6.0  ALBUMIN 3.5 3.3*    Recent Labs Lab 04/27/13 1145  LIPASE 30   No results found for this basename: AMMONIA,  in the last 168 hours CBC:  Recent Labs Lab 04/22/13 0906 04/27/13 1145  WBC 1.1* 1.4*  NEUTROABS 0.6* 0.5*  HGB 8.2* 6.7*  HCT 23.6* 19.0*  MCV 89.4 89.6  PLT 3* 12*  CBG:  Recent Labs Lab 04/27/13 1051  GLUCAP 140*    Radiological Exams on Admission: No results found.  Assessment/Plan Principal Problem:   Pancytopenia due to antineoplastic chemotherapy -For her significant normocytic anemia, 2 units of packed red blood cells has been ordered. -No current signs of bleeding given her low platelet count but will hold DVT prophylaxis and monitor for signs of bleeding. -For her neutropenia and rigors, will send 2 sets of blood cultures, check a chest x-ray, urinalysis/culture and cover with empiric cefepime. She does not have a Port-A-Cath or indwelling hardware so vancomycin not felt to be needed at this time. -Consider Neupogen. Active Problems:   Nausea and vomiting -Continue IV fluids. Antinausea medications ordered as needed.   CORONARY ATHEROSCLEROSIS NATIVE CORONARY ARTERY -No complaints of current chest pain or symptoms suggestive of acute coronary syndrome. -Continue aspirin and statin therapy. Continue bystolic.   PAF (paroxysmal atrial fibrillation) -Heart sounds are currently regular. Continue Tambocor.   COPD (chronic obstructive pulmonary disease) -No evidence of acute exacerbation at present. No wheezing on exam. -Continue nebulized bronchodilator therapy as needed.   Essential hypertension, benign -Continue current medications including Tambocor, Bystolic.   Lung  cancer -Will notify Dr. Arbutus Ped of the patient's admission.   Hypokalemia -Increase usual supplementation dose and add potassium to IV fluids.  Code Status: Full. Family Communication: None  present.  Legrand Pitts, brother is emergency contact. Disposition Plan: Home when stable.  Time spent: 65 minutes.  Birdie Beveridge [facility] Pager [phone]  If 7PM-7AM, please contact night-coverage [email or web address] Password [facility] 04/27/2013, 2:44 PM

## 2013-04-27 NOTE — ED Provider Notes (Signed)
History    CSN: [id number] Arrival date & time 04/27/13  1047  First MD Initiated Contact with Patient 04/27/13 1048     Chief Complaint  Patient presents with  . Emesis    HPI    Pt is a 62 yo Caucasian F with pmh CAD, HL,  afib on flecainide, COPD not on home O2, metastatic non-small lung cancer (dx 04/2012) s/p radiation to right lung, brain, and left adrenal and currently s/p 5 cycles of chemotherapy who presents with 5 day h/o of nausea, emesis, and weakness. Pt reports she has been feeling nauseous with episodes of emesis  for the past 5 days. She has been receiving chemotherapy regimen (Carboplatin and Alimta)  every 3 weeks with latest treatment per patient 1 day ago. However per records last treatment (5th) was on 7/2. Her oncologist is Dr Arbutus Ped. She denies fever, chills, HA, dyspnea, CP, and abdominal pain. No changes in BM or urination. No recent infections. She does not have a permanent catheter. She reports feeling weak and dizzy for past 3 weeks with resulting difficulty in ambulating.     .      Past Medical History  Diagnosis Date  . Atrial fibrillation   . Coronary atherosclerosis of native coronary artery     Nonobstructive, LVEF 60%  . COPD (chronic obstructive pulmonary disease)   . Mixed hyperlipidemia   . Essential hypertension, benign   . Tachycardia-bradycardia syndrome   . Embolism - blood clot     PE  . SOB (shortness of breath) on exertion   . Allergy     Latex  . Asthma   . Anxiety   . Myocardial infarction     12 years ago ?  [name] History of radiation therapy     59 gy fraction 5 eot 07/09/12 right lung  . Lung cancer 04/29/12 dx    RLL needle bx=invasive well diff adenocarcinoma  nscca  . Adrenal insufficiency     iatrogenic from dexamethasone   Past Surgical History  Procedure Laterality Date  . Appendectomy    . Insert / replace / remove pacemaker  3/11     St. Jude - Dr. [name]  . Video bronchoscopy  02/22/2012    Procedure: VIDEO  BRONCHOSCOPY WITH FLUORO;  Surgeon: Nyoka Cowden, MD;  Location: [facility];  Service: Cardiopulmonary;  Laterality: Bilateral;  . Lung biopsy  04/29/12    RLL invasive well diff adenocarcinoma  . Lung biopsy  02/22/12    RML=benign lung parenchyma,no tumor sen  . Tonsillectomy and adenoidectomy      age 73  . Total abdominal hysterectomy  1975    1/2 right ovary intact, left salpingo-oppherctomy  . Mr brain w wo contrast  11/17/2012       . Esophagogastroduodenoscopy N/A 01/15/2013    Procedure: ESOPHAGOGASTRODUODENOSCOPY (EGD);  Surgeon: Corbin Ade, MD;  Location: [facility];  Service: Endoscopy;  Laterality: N/A;   Family History  Problem Relation Age of Onset  . Hypertension Mother   . Emphysema Brother     smoker  . Asthma Father   . Asthma Brother   . Lung cancer Paternal Grandmother     was a smoker  . Cancer Maternal Grandmother     lung, dx early 87's, died 3 days after lobectomy   History  Substance Use Topics  . Smoking status: Former Smoker -- 0.25 packs/day for 43 years    Types: Cigarettes    Quit date: 10/08/2012  .  Smokeless tobacco: Former Neurosurgeon     Comment: quit 2 weeks ago after smoking since age 62. Up to 1 pack a day  . Alcohol Use: No   OB History   Grav Para Term Preterm Abortions TAB SAB Ect Mult Living                 Review of Systems  Constitutional: Positive for appetite change and fatigue. Negative for fever and chills.  HENT: Negative for sore throat, trouble swallowing and neck stiffness.   Respiratory: Negative for cough and shortness of breath.   Cardiovascular: Negative for chest pain, palpitations and leg swelling.  Gastrointestinal: Positive for nausea and vomiting. Negative for abdominal pain, diarrhea, constipation, blood in stool and abdominal distention.  Genitourinary: Negative for difficulty urinating.  Neurological: Positive for dizziness and weakness. Negative for headaches.  Psychiatric/Behavioral:       Memory deficit     Allergies  Latex  Home Medications   Current Outpatient Rx  Name  Route  Sig  Dispense  Refill  . albuterol (PROAIR HFA) 108 (90 BASE) MCG/ACT inhaler   Inhalation   Inhale 2 puffs into the lungs every 6 (six) hours as needed for wheezing or shortness of breath.   1 Inhaler   1   . albuterol (PROVENTIL) (2.5 MG/3ML) 0.083% nebulizer solution   Nebulization   Take 3 mLs (2.5 mg total) by nebulization every 6 (six) hours as needed for wheezing or shortness of breath.   75 mL   1   . aspirin EC 81 MG tablet   Oral   Take 1 tablet (81 mg total) by mouth daily. Restart this medicine on 5/14   30 tablet   0   . flecainide (TAMBOCOR) 100 MG tablet   Oral   Take 1 tablet (100 mg total) by mouth 2 (two) times daily.   60 tablet   5   . folic acid (FOLVITE) 1 MG tablet   Oral   Take 1 tablet (1 mg total) by mouth daily.   30 tablet   2   . glucose 4 GM chewable tablet   Oral   Chew 4 tablets (16 g total) by mouth as needed for low blood sugar.   50 tablet   12   . hyoscyamine (LEVSIN SL) 0.125 MG SL tablet   Oral   Take 1 tablet (0.125 mg total) by mouth every 4 (four) hours as needed for cramping.   30 tablet   0   . ipratropium (ATROVENT) 0.02 % nebulizer solution   Nebulization   Take 2.5 mLs (500 mcg total) by nebulization 4 (four) times daily as needed for wheezing (shortness of breath).   75 mL   1   . loratadine (CLARITIN) 10 MG tablet   Oral   Take 1 tablet (10 mg total) by mouth daily.   30 tablet   0   . meclizine (ANTIVERT) 25 MG tablet   Oral   Take 1 tablet (25 mg total) by mouth 3 (three) times daily as needed.   30 tablet   0   . MELATONIN ER PO   Oral   Take 5 mg by mouth at bedtime.         . nebivolol (BYSTOLIC) 2.5 MG tablet   Oral   Take 1 tablet (2.5 mg total) by mouth daily.   30 tablet   0   . oxyCODONE-acetaminophen (PERCOCET) 10-325 MG per tablet   Oral   Take  1 tablet by mouth every 8 (eight) hours as needed for  pain. Takes once daily  And prn for knees   30 tablet   0   . potassium chloride SA (K-DUR,KLOR-CON) 20 MEQ tablet   Oral   Take 1 tablet (20 mEq total) by mouth daily.   7 tablet   0   . prochlorperazine (COMPAZINE) 10 MG tablet   Oral   Take 1 tablet (10 mg total) by mouth every 6 (six) hours as needed.   30 tablet   0   . pantoprazole (PROTONIX) 40 MG tablet   Oral   Take 1 tablet (40 mg total) by mouth daily. 40 mg po BID x 14 days, then daily continuous.   30 tablet   2   . potassium chloride SA (KLOR-CON M20) 20 MEQ tablet   Oral   Take 1 tablet (20 mEq total) by mouth daily.   30 tablet   3   . rosuvastatin (CRESTOR) 40 MG tablet   Oral   Take 1 tablet (40 mg total) by mouth daily.   30 tablet   11    BP 136/93  Pulse 104  Temp(Src) 98.3 F (36.8 C) (Oral)  Resp 16  SpO2 100% Physical Exam  Constitutional: She is oriented to person, place, and time. No distress.  Very pale, shivering   HENT:  Head: Normocephalic and atraumatic.  Eyes: EOM are normal.  Neck: Normal range of motion. Neck supple.  Cardiovascular: Normal rate and regular rhythm.   Pulmonary/Chest: Effort normal and breath sounds normal. No respiratory distress.  Abdominal: Soft. Bowel sounds are normal. She exhibits no distension. There is no tenderness. There is no rebound and no guarding.  Musculoskeletal: Normal range of motion. She exhibits no edema and no tenderness.  Neurological: She is alert and oriented to person, place, and time.  Skin: Skin is warm and dry. She is not diaphoretic. There is pallor.  Psychiatric:  Trouble with speech and memory    ED Course  Procedures (including critical care time) Labs Reviewed  GLUCOSE, CAPILLARY - Abnormal; Notable for the following:    Glucose-Capillary 140 (*)    All other components within normal limits  CBC WITH DIFFERENTIAL - Abnormal; Notable for the following:    WBC 1.4 (*)    RBC 2.12 (*)    Hemoglobin 6.7 (*)    HCT 19.0  (*)    RDW 19.5 (*)    Platelets 12 (*)    Neutrophils Relative % 37 (*)    Monocytes Relative 29 (*)    Neutro Abs 0.5 (*)    Lymphs Abs 0.5 (*)    All other components within normal limits  COMPREHENSIVE METABOLIC PANEL - Abnormal; Notable for the following:    Potassium 3.0 (*)    Glucose, Bld 118 (*)    Albumin 3.3 (*)    GFR calc non Af Amer 55 (*)    GFR calc Af Amer 63 (*)    All other components within normal limits  URINALYSIS, ROUTINE W REFLEX MICROSCOPIC - Abnormal; Notable for the following:    Hgb urine dipstick TRACE (*)    Protein, ur 100 (*)    All other components within normal limits  CULTURE, BLOOD (ROUTINE X 2)  CULTURE, BLOOD (ROUTINE X 2)  LIPASE, BLOOD  URINE MICROSCOPIC-ADD ON  PREPARE RBC (CROSSMATCH)  TYPE AND SCREEN   No results found. 1. Pancytopenia     MDM  Assessment:  62 yo  Caucasian F with pmh CAD, HL, afib on flecainide, COPD not on home O2, metastatic non-small lung cancer (dx 04/2012) s/p radiation to right lung, brain, and left adrenal,  s/p 5 cycles of chemotherapy who presents with 5 day h/o of nausea, emesis, and weakness.     Plan:   Weakness, Nausea, & Emesis - SE chemotherapy vs neutropenic fever vs hyponatremia vs anemia vs high ICP vs adrenal insufficiency  -Obtain CBC w/ diff ---> neutropenia, pancytopenia, Hg 6.7---> type and screen, transfuse 2 PRBC for goal Hg>8 -Obtain CMP ---> hypokalemia--->replace with PO potassium chloride, hypoalbuminemia most likely due to starvation -Obtain Lipase --> WNL  -Obtain UA ---> trace hematuria, proteinuria -Obtain blood cultures x 2 ---> pending  -Administer 2L NS bolus for hypovolemia  -Administer Ativan, Reglan, benadryl for nausea     Disposition: Admit to inpatient ---> Chemotherapy patient with pancytopenia most likely due to bone marrow suppression.   Symptomatic anemia requiring blood transfusion. Also with asymptomatic significant thrombocytopenia. Currently afebrile and  normotensive, will obtain blood cultures due to significant neutropenia (ANC 0.5, very high risk of rapidly fatal infection) and start empiric broad-spectrum, anti-pseudomonal monotherapy with cefepime.  Oral potassium replacement given for hypokalemia.        Otis Brace, MD 04/27/13 [phone]

## 2013-04-27 NOTE — ED Notes (Addendum)
Pt reports n/v for past 4 days.  Had chemo yesterday for brain ca, states this is the same chemo she has had for a while.  No diarrhea. Also reports shakiness for past 24 hours. cbg 140. Denies pain. ems gave NS and 4mg  zofran which has relieved nausea.

## 2013-04-27 NOTE — ED Notes (Signed)
AVW:[id number]<XB> Expected date:<BR> Expected time:<BR> Means of arrival:<BR> Comments:<BR> ems- cancer pt, n/v

## 2013-04-27 NOTE — ED Provider Notes (Signed)
I saw and evaluated the patient, reviewed the resident's note and I agree with the findings and plan.  Patient here complaining of nausea and vomiting x4 days. She is currently getting chemotherapy for cancer. Also notes whole-body weakness.  Patient with pancytopenia. Patient has a type and screen a packed red blood cells and ordered  Patient to be admitted to the hospitalist service for blood transfusion and further workup  Toy Baker, MD 04/27/13 1252

## 2013-04-28 ENCOUNTER — Encounter: Payer: Self-pay | Admitting: Oncology

## 2013-04-28 LAB — CBC WITH DIFFERENTIAL/PLATELET
Basophils Absolute: 0 10*3/uL (ref 0.0–0.1)
Eosinophils Absolute: 0 10*3/uL (ref 0.0–0.7)
Lymphs Abs: 0.2 10*3/uL — ABNORMAL LOW (ref 0.7–4.0)
MCH: 31.9 pg (ref 26.0–34.0)
MCHC: 35.4 g/dL (ref 30.0–36.0)
MCV: 90.1 fL (ref 78.0–100.0)
Monocytes Absolute: 0.8 10*3/uL (ref 0.1–1.0)
Monocytes Relative: 41 % — ABNORMAL HIGH (ref 3–12)
Neutro Abs: 0.9 10*3/uL — ABNORMAL LOW (ref 1.7–7.7)
Platelets: 13 10*3/uL — CL (ref 150–400)
RDW: 17.6 % — ABNORMAL HIGH (ref 11.5–15.5)
WBC: 1.9 10*3/uL — ABNORMAL LOW (ref 4.0–10.5)

## 2013-04-28 LAB — BASIC METABOLIC PANEL
BUN: 9 mg/dL (ref 6–23)
CO2: 21 mEq/L (ref 19–32)
Calcium: 7.9 mg/dL — ABNORMAL LOW (ref 8.4–10.5)
Creatinine, Ser: 1 mg/dL (ref 0.50–1.10)

## 2013-04-28 LAB — TYPE AND SCREEN: Unit division: 0

## 2013-04-28 NOTE — Evaluation (Signed)
Physical Therapy Evaluation Patient Details Name: [name] MRN: [id number] DOB: [birth year] Today's Date: 04/28/2013 Time: 1330-1350 PT Time Calculation (min): 20 min  PT Assessment / Plan / Recommendation History of Present Illness  62 y.o. female with h/o metastatic lung cancer, mets to brain, admitted with nausea, vomiting, pancytopenia.   Clinical Impression  *Pt with poor memory, unable to provide home info or prior functional level. Ambulated 25' with RW and min A, distance limited by SOB/fatigue.  24* assist recommended 2* cognitive status. SNF vs home with family. She would benefit from acute PT to maximize safety and independence with mobility.  **    PT Assessment  Patient needs continued PT services    Follow Up Recommendations  SNF;Supervision/Assistance - 24 hour (SNF if family unable to provide 24* assist)    Does the patient have the potential to tolerate intense rehabilitation      Barriers to Discharge        Equipment Recommendations  Rolling walker with 5" wheels    Recommendations for Other Services     Frequency Min 3X/week    Precautions / Restrictions Precautions Precautions: Fall Restrictions Weight Bearing Restrictions: No   Pertinent Vitals/Pain *0/10**      Mobility  Bed Mobility Bed Mobility: Supine to Sit Supine to Sit: HOB elevated;4: Min guard Transfers Transfers: Sit to Stand;Stand to Sit Sit to Stand: 4: Min assist;4: Min guard;From bed Stand to Sit: 5: Supervision;To chair/3-in-1 Details for Transfer Assistance: min/guard for balance/safety Ambulation/Gait Ambulation/Gait Assistance: 4: Min guard Ambulation Distance (Feet): 34 Feet Assistive device: Rolling walker Ambulation/Gait Assistance Details: distance limited by SOB, fatigue, SaO2 95% on RA, HR 106 Gait Pattern: Within Functional Limits General Gait Details: min/guard for safety/balance    Exercises     PT Diagnosis: Generalized weakness  PT Problem List:  Decreased activity tolerance;Decreased mobility PT Treatment Interventions: Gait training;Functional mobility training;Therapeutic activities;Therapeutic exercise;Patient/family education     PT Goals(Current goals can be found in the care plan section) Acute Rehab PT Goals Patient Stated Goal: "to be able to talk like I want to" PT Goal Formulation: Patient unable to participate in goal setting Time For Goal Achievement: 05/12/13 Potential to Achieve Goals: Fair  Visit Information  Last PT Received On: 04/28/13 Assistance Needed: +1 History of Present Illness: 62 y.o. female with h/o metastatic lung cancer, mets to brain, admitted with nausea, vomiting, pancytopenia.        Prior Functioning  Home Living Family/patient expects to be discharged to:: Private residence Additional Comments:   Difficult to get a clear picture of home environment, pt is poor historian, gives varying answers to questions about home set up and prior functional level.     Cognition  Cognition Arousal/Alertness: Awake/alert Behavior During Therapy: WFL for tasks assessed/performed Overall Cognitive Status: No family/caregiver present to determine baseline cognitive functioning Memory: Decreased short-term memory    Extremity/Trunk Assessment Upper Extremity Assessment Upper Extremity Assessment: Overall WFL for tasks assessed Lower Extremity Assessment Lower Extremity Assessment: Overall WFL for tasks assessed Cervical / Trunk Assessment Cervical / Trunk Assessment: Normal   Balance    End of Session PT - End of Session Equipment Utilized During Treatment: Gait belt Activity Tolerance: Patient limited by fatigue Patient left: in chair;with call bell/phone within reach Nurse Communication: Mobility status  GP     Ralene Bathe Kistler 04/28/2013, 2:03 PM [phone]

## 2013-04-28 NOTE — Progress Notes (Signed)
[facility] PROGRESS NOTE  [name] ZOX:[id number] DOB: 09-Jun-1951 DOA: 04/27/2013 PCP: Ernestine Conrad, MD  Brief narrative: [name] is an 62 y.o. female with a PMH of atrial fibrillation, CAD, COPD, HTN, metastatic non-small cell lung cancer initially diagnosed as synchronous primary non-small cell lung cancer, adenocarcinoma with negative EGFR mutation and negative ALK gene translocation involving the right middle lobe and lower lobe diagnosed in July of 2013 status post stereotactic radiotherapy to right lung lesions completed on 07/09/2012; whole brain irradiation completed on 12/04/2012 and palliative radiotherapy to the left adrenal gland completed on 03/13/13. Also s/p 5 cycles of Alimta and carboplatin Q 3 weeks under the care of Dr. Arbutus Ped.  She was admitted on 04/27/2013 with nausea, vomiting, weakness, rigors and pancytopenia/neutropenia.   Assessment/Plan: Principal Problem:  Pancytopenia due to antineoplastic chemotherapy  -Status post 2 units of packed red blood cells for her significant anemia with an appropriate rise in her hemoglobin.  -No current signs of bleeding given her low platelet count but will continue to hold DVT prophylaxis and monitor for signs of bleeding.  -For her neutropenia and rigors, 2 sets of blood cultures were sent, a chest x-ray was done which did not show any obvious pneumonia, urinalysis was negative for nitrites/leukocytes.  Continue empiric cefepime. She does not have a Port-A-Cath or indwelling hardware so vancomycin not felt to be needed at this time.  -Continue Neupogen. Absolute neutrophil count currently 0.9. Active Problems:  Nausea and vomiting  -Continue IV fluids. Antinausea medications ordered as needed.  CORONARY ATHEROSCLEROSIS NATIVE CORONARY ARTERY  -No complaints of current chest pain or symptoms suggestive of acute coronary syndrome.  -Continue aspirin and statin therapy. Continue bystolic.  PAF (paroxysmal atrial  fibrillation)  -Heart sounds are currently regular. Continue Tambocor.  COPD (chronic obstructive pulmonary disease)  -No evidence of acute exacerbation at present. No wheezing on exam.  -Continue nebulized bronchodilator therapy as needed.  Essential hypertension, benign  -Continue current medications including Tambocor, Bystolic.  Lung cancer with brain metastasis -Dr. Arbutus Ped has been notified of the patient's admission.  Hypokalemia  -Resolved with supplementation.  Code Status: Full.  Family Communication: None present. Legrand Pitts, brother is emergency contact.  Disposition Plan: Home when stable.  Medical Consultants:  None.  Other Consultants:  None.  Anti-infectives:  Cefepime 04/27/2013--->  HPI/Subjective: [name] still feels poorly though her symptoms are mainly nonspecific. She has not had any active vomiting overnight or so far this morning. Her appetite is fair. No specific complaints of pain. No shortness of breath. She does have some word finding difficulty but says this is chronic. No new focal neurological deficits.  Objective: Filed Vitals:   04/27/13 2122 04/27/13 2222 04/27/13 2300 04/28/13 0514  BP: 115/60 115/58 110/60 128/60  Pulse: 78 76 77 78  Temp: 98.6 F (37 C) 98.5 F (36.9 C) 98.5 F (36.9 C) 98.5 F (36.9 C)  TempSrc:    Oral  Resp: 18 20 20 16   Height:      Weight:      SpO2:    100%    Intake/Output Summary (Last 24 hours) at 04/28/13 0747 Last data filed at 04/28/13 0514  Gross per 24 hour  Intake    930 ml  Output      0 ml  Net    930 ml    Exam: Gen:  NAD Cardiovascular:  RRR, No M/R/G Respiratory:  Lungs diminished but clear Gastrointestinal:  Abdomen soft, NT/ND, + BS  Extremities:  Trace edema  Data Reviewed: Basic Metabolic Panel:  Recent Labs Lab 04/22/13 0906 04/27/13 1145 04/28/13 0343  NA 138 137 140  K 2.8 Repeated and Verified* 3.0* 3.5  CL  --  103 111  CO2 18* 20 21  GLUCOSE 99 118*  92  BUN 16.0 12 9  CREATININE 1.1 1.06 1.00  CALCIUM 8.5 8.8 7.9*   GFR Estimated Creatinine Clearance: 51.5 ml/min (by C-G formula based on Cr of 1). Liver Function Tests:  Recent Labs Lab 04/22/13 0906 04/27/13 1145  AST 25 22  ALT 18 12  ALKPHOS 42 47  BILITOT 0.92 0.6  PROT 6.5 6.0  ALBUMIN 3.5 3.3*    Recent Labs Lab 04/27/13 1145  LIPASE 30   CBC:  Recent Labs Lab 04/22/13 0906 04/27/13 1145 04/28/13 0343  WBC 1.1* 1.4* 1.9*  NEUTROABS 0.6* 0.5* 0.9*  HGB 8.2* 6.7* 8.7*  HCT 23.6* 19.0* 24.6*  MCV 89.4 89.6 90.1  PLT 3* 12* 13*   CBG:  Recent Labs Lab 04/27/13 1051  GLUCAP 140*   Microbiology No results found for this or any previous visit (from the past 240 hour(s)).   Procedures and Diagnostic Studies: Dg Chest Port 1 View  04/27/2013   *RADIOLOGY REPORT*  Clinical Data: Evaluate for pneumonia.  Fever, cough.  Neutropenia. History of lung cancer, COPD, asthma, atrial fibrillation.  PORTABLE CHEST - 1 VIEW  Comparison: 04/15/2013  Findings: Heart is enlarged.  Left-sided pacemaker leads overlie the right atrium and right ventricle.  Nodular density at the right lung base appears stable radiographically.  No new consolidations or pleural effusions are identified.  IMPRESSION: Persistent right lung base nodule. Cardiomegaly.   Original Report Authenticated By: Norva Pavlov, M.D.    Scheduled Meds: . aspirin EC  81 mg Oral Daily  . atorvastatin  80 mg Oral q1800  . ceFEPime (MAXIPIME) IV  2 g Intravenous Q8H  . filgrastim (NEUPOGEN)  SQ  300 mcg Subcutaneous q1800  . flecainide  100 mg Oral BID  . folic acid  1 mg Oral Daily  . meclizine  25 mg Oral Daily  . nebivolol  2.5 mg Oral Daily  . potassium chloride SA  20 mEq Oral BID  . traZODone  100 mg Oral QHS   Continuous Infusions: . 0.9 % NaCl with KCl 40 mEq / L 75 mL/hr at 04/27/13 1534    Time spent: 35 minutes with greater than 50 time spent counseling the patient on her current  diagnostic test results, clinical impression and plan of care.   LOS: 1 day   Marnesha Gagen  [facility] Pager [phone].   *Please note that the hospitalists switch teams on Wednesdays. Please call the flow manager at [phone] if you are having difficulty reaching the hospitalist taking care of this patient as she can update you and provide the most up-to-date pager number of provider caring for the patient. If 8PM-8AM, please contact night-coverage at [email or web address], password [facility]  04/28/2013, 7:47 AM

## 2013-04-28 NOTE — ED Provider Notes (Signed)
I saw and evaluated the patient, reviewed the resident's note and I agree with the findings and plan.  Krista Godsil T Gabriellia Rempel, MD 04/28/13 0758 

## 2013-04-29 ENCOUNTER — Other Ambulatory Visit: Payer: Medicare Other | Admitting: Lab

## 2013-04-29 ENCOUNTER — Ambulatory Visit: Payer: Medicare Other

## 2013-04-29 ENCOUNTER — Ambulatory Visit: Payer: Medicare Other | Admitting: Internal Medicine

## 2013-04-29 DIAGNOSIS — F05 Delirium due to known physiological condition: Secondary | ICD-10-CM

## 2013-04-29 DIAGNOSIS — J45901 Unspecified asthma with (acute) exacerbation: Secondary | ICD-10-CM

## 2013-04-29 DIAGNOSIS — C342 Malignant neoplasm of middle lobe, bronchus or lung: Secondary | ICD-10-CM

## 2013-04-29 LAB — CBC WITH DIFFERENTIAL/PLATELET
Basophils Relative: 1 % (ref 0–1)
Eosinophils Relative: 1 % (ref 0–5)
Hemoglobin: 9 g/dL — ABNORMAL LOW (ref 12.0–15.0)
MCH: 31.6 pg (ref 26.0–34.0)
Monocytes Absolute: 1 10*3/uL (ref 0.1–1.0)
Neutrophils Relative %: 59 % (ref 43–77)
RBC: 2.85 MIL/uL — ABNORMAL LOW (ref 3.87–5.11)
WBC Morphology: INCREASED

## 2013-04-29 LAB — BASIC METABOLIC PANEL
BUN: 6 mg/dL (ref 6–23)
Chloride: 109 mEq/L (ref 96–112)
GFR calc Af Amer: 86 mL/min — ABNORMAL LOW (ref >90)
Potassium: 3.5 mEq/L (ref 3.5–5.1)
Sodium: 136 mEq/L (ref 135–145)

## 2013-04-29 MED ORDER — OXYCODONE-ACETAMINOPHEN 5-325 MG PO TABS
1.0000 | ORAL_TABLET | Freq: Three times a day (TID) | ORAL | Status: DC
  Administered 2013-04-29 – 2013-04-30 (×2): 1 via ORAL
  Filled 2013-04-29 (×4): qty 1

## 2013-04-29 MED ORDER — OXYCODONE HCL 5 MG PO TABS
5.0000 mg | ORAL_TABLET | Freq: Three times a day (TID) | ORAL | Status: DC
  Administered 2013-04-29 – 2013-04-30 (×2): 5 mg via ORAL
  Filled 2013-04-29 (×4): qty 1

## 2013-04-29 MED ORDER — LORAZEPAM 2 MG/ML IJ SOLN: 1.0000 mg | Freq: Four times a day (QID) | INTRAMUSCULAR | Status: DC | PRN

## 2013-04-29 NOTE — Progress Notes (Signed)
Clinical Social Work Department BRIEF PSYCHOSOCIAL ASSESSMENT 04/29/2013  Patient:  [name], [name]     Account Number:  [id number]     Admit date:  04/27/2013  Clinical Social Worker:  Jacelyn Grip  Date/Time:  04/29/2013 01:26 PM  Referred by:  Physician  Date Referred:  04/29/2013 Referred for  ALF Placement   Other Referral:   Interview type:  Patient Other interview type:    PSYCHOSOCIAL DATA Living Status:  FACILITY Admitted from facility:  OTHER Level of care:  Assisted Living Primary support name:  [name] Trollinger/brother/[phone] Primary support relationship to patient:  SIBLING Degree of support available:   Patient admitted from [facility] in [place], [place]    Pt brother provides adequate support to pt    CURRENT CONCERNS Current Concerns  Post-Acute Placement   Other Concerns:    SOCIAL WORK ASSESSMENT / PLAN CSW received referral that pt admitted from [facility] in [place], [place].    CSW met with pt at bedside to discuss. Pt confirmed that she is from [facility]. Pt states that she would like to return to ALF at discharge as she is satisfied with the care that she receives there. CSW discussed that PT recommending that pt needs therapy upon discharge. Pt agreeable to [facility] PT and agreeable to using the home health agency that the ALF recommends.    Pt stated that she did not want CSW to contact pt brother at this time, but would like for pt brother to be updated when pt nearing discharge.    CSW contacted [facility], Darrall Dears, supervisor of facility. Facility Supervisor discussed that facility provides assistance with patients activites of daily living. Facility supervisor reports that the facility is small and that pt walking min assist 34 feet is adequate for pt to return to ALF level of care. Facility supervisor discussed that ALF provides transportation to all of patients appointments including pt treatments at  [facility]. Facility supervisor does agree that pt will need HH PT at ALF and facility preference is [facility].    CSW notified RNCM of HH needs at ALF.    CSW to continue to follow to assist with pt return to [facility] when pt medically ready for discharge.   Assessment/plan status:  Psychosocial Support/Ongoing Assessment of Needs Other assessment/ plan:   discharge planning   Information/referral to community resources:   Referral back to [facility].  RNCM for [facility] needs.    PATIENT'S/FAMILY'S RESPONSE TO PLAN OF CARE: Pt alert and oriented x 3. Pt answered questions appropriately, but was tired and resting at this time. Pt reports that she is satisfied with her care at [facility] and plans to return.     Jacklynn Lewis, MSW, LCSWA  Clinical Social Work [phone]

## 2013-04-29 NOTE — Progress Notes (Signed)
[facility]  Patient Status: Active (receiving services up to time of hospitalization)  [facility] is providing the following services: RN and PT  If patient discharges after hours, please call [phone].   Lanae Crumbly 04/29/2013, 3:22 PM

## 2013-04-29 NOTE — Progress Notes (Signed)
[facility] PROGRESS NOTE  [name] NWG:[id number] DOB: October 14, 1950 DOA: 04/27/2013 PCP: Ernestine Conrad, MD  Brief narrative: 62 y.o. female with a PMH of atrial fibrillation, CAD, COPD, HTN, metastatic non-small cell lung cancer initially diagnosed as synchronous primary non-small cell lung cancer involving the right middle lobe and lower lobe diagnosed in July of 2013 status post stereotactic radiotherapy to right lung lesions completed on 07/09/2012; whole brain irradiation completed on 12/04/2012 and palliative radiotherapy to the left adrenal gland completed on 03/13/13. Also s/p 5 cycles of Alimta and carboplatin Q 3 weeks under the care of Dr. Arbutus Ped. She was admitted on 04/27/2013 with nausea, vomiting, weakness, rigors and pancytopenia/neutropenia.   Assessment/Plan:   Principal Problem:  Pancytopenia due to antineoplastic chemotherapy  - Neutropenia improving, WBC 1.4 --> 3.7; we will discontinue Neupogen today - Hemoglobin stable at 9.0, status post 2 units of packed red blood cells with an appropriate rise in her hemoglobin (6.7 --> 9.0) - Wound slowly trending up, and 16 today. Hold DVT prophylaxis and continue to monitor for any signs of bleeding. - Please note the dad chest x-ray was negative for pneumonia. Urinalysis was negative for nitrites or leukocytes. We will however continue empiric cefepime until final results of blood cultures are available. Preliminary report for blood cultures shows no growth to date  Active Problems:  Nausea and vomiting  - Improving with anti-emetics CORONARY ATHEROSCLEROSIS NATIVE CORONARY ARTERY  - Continue aspirin and statin therapy; continue bystolic  PAF (paroxysmal atrial fibrillation)  - Heart sounds are regular. Continue Tambocor.  COPD (chronic obstructive pulmonary disease)  - stable  - Continue nebulized bronchodilator therapy as needed.  Essential hypertension, benign  - Continue current medications including Tambocor,  Bystolic.  Lung cancer with brain metastasis  - management per oncology.  Hypokalemia  - Resolved with supplementation.   Code Status: Full.  Family Communication: None present. Legrand Pitts, brother is emergency contact.  Disposition Plan: Home when stable.   Medical Consultants:  None. Other Consultants:  None. Anti-infectives:  Cefepime 04/27/2013--->    If 7PM-7AM, please contact night-coverage [email or web address] Password [email or web address] 04/29/2013, 10:18 AM   LOS: 2 days    HPI/Subjective: No acute overnight events.  Objective: Filed Vitals:   04/28/13 1357 04/28/13 1400 04/28/13 2100 04/29/13 0500  BP:  138/62 124/58 131/50  Pulse:  83 76 80  Temp:  98 F (36.7 C) 98.6 F (37 C) 97.7 F (36.5 C)  TempSrc:  Oral Oral Oral  Resp:  20 18 20   Height:      Weight:      SpO2: 95% 100% 100% 100%    Intake/Output Summary (Last 24 hours) at 04/29/13 1018 Last data filed at 04/29/13 0836  Gross per 24 hour  Intake    240 ml  Output   3450 ml  Net  -3210 ml    Exam:   General:  Pt is alert, follows commands appropriately, not in acute distress  Cardiovascular: Regular rate and rhythm, S1/S2, no murmurs, no rubs, no gallops  Respiratory: Clear to auscultation bilaterally, no wheezing, no crackles, no rhonchi  Abdomen: Soft, non tender, non distended, bowel sounds present, no guarding  Extremities: No edema, pulses DP and PT palpable bilaterally; UE ecchymoses   Neuro: Grossly nonfocal  Data Reviewed: Basic Metabolic Panel:  Recent Labs Lab 04/27/13 1145 04/28/13 0343 04/29/13 0338  NA 137 140 136  K 3.0* 3.5 3.5  CL 103 111 109  CO2 20 21 16*  GLUCOSE 118*  92 82  BUN 12 9 6   CREATININE 1.06 1.00 0.82  CALCIUM 8.8 7.9* 8.2*   Liver Function Tests:  Recent Labs Lab 04/27/13 1145  AST 22  ALT 12  ALKPHOS 47  BILITOT 0.6  PROT 6.0  ALBUMIN 3.3*    Recent Labs Lab 04/27/13 1145  LIPASE 30   No results found for this basename: AMMONIA,  in  the last 168 hours CBC:  Recent Labs Lab 04/27/13 1145 04/28/13 0343 04/29/13 0338  WBC 1.4* 1.9* 3.7*  NEUTROABS 0.5* 0.9* 2.3  HGB 6.7* 8.7* 9.0*  HCT 19.0* 24.6* 25.5*  MCV 89.6 90.1 89.5  PLT 12* 13* 16*   Cardiac Enzymes: No results found for this basename: CKTOTAL, CKMB, CKMBINDEX, TROPONINI,  in the last 168 hours BNP: No components found with this basename: POCBNP,  CBG:  Recent Labs Lab 04/27/13 1051  GLUCAP 140*    CULTURE, BLOOD (ROUTINE X 2)     Status: None   Collection Time    04/27/13  1:45 PM      Result Value Range Status   Specimen Description BLOOD LEFT HAND   Final   Special Requests BOTTLES DRAWN AEROBIC ONLY 3 ML   Final   Culture  Setup Time 04/27/2013 22:10   Final   Culture     Final   Value:        BLOOD CULTURE RECEIVED NO GROWTH TO DATE CULTURE WILL BE HELD FOR 5 DAYS BEFORE ISSUING A FINAL NEGATIVE REPORT   Report Status PENDING   Incomplete  CULTURE, BLOOD (ROUTINE X 2)     Status: None   Collection Time    04/27/13  1:50 PM      Result Value Range Status   Specimen Description BLOOD LEFT ANTECUBITAL   Final   Special Requests BOTTLES DRAWN AEROBIC ONLY 3 ML   Final   Culture  Setup Time 04/27/2013 22:09   Final   Culture     Final   Value:        BLOOD CULTURE RECEIVED NO GROWTH TO DATE CULTURE WILL BE HELD FOR 5 DAYS BEFORE ISSUING A FINAL NEGATIVE REPORT   Report Status PENDING   Incomplete     Studies: Dg Chest Port 1 View 04/27/2013   * IMPRESSION: Persistent right lung base nodule. Cardiomegaly.   Original Report Authenticated By: Norva Pavlov, M.D.    Scheduled Meds: . aspirin EC  81 mg Oral Daily  . atorvastatin  80 mg Oral q1800  . ceFEPime (MAXIPIME) IV  2 g Intravenous Q8H  . flecainide  100 mg Oral BID  . folic acid  1 mg Oral Daily  . meclizine  25 mg Oral Daily  . nebivolol  2.5 mg Oral Daily  . potassium chloride SA  20 mEq Oral BID  . traZODone  100 mg Oral QHS   Continuous Infusions: . 0.9 % NaCl with  KCl 40 mEq / L 75 mL/hr at 04/28/13 2345

## 2013-04-29 NOTE — Care Management Note (Signed)
PT eval recommends SNF. Pt currently resides at [facility], [address], [place], [place]. where she plans to return at discharge. Pt offered choice of HH services to provide HHPT. Upon pt choice [facility] to provide [facility] services. [facility] liasion Kristen Hayworth notified of referal. Awaiting MD order for HHPT,HHRN upon discharge.   Roxy Manns Almond Fitzgibbon,RN,BSN [phone]

## 2013-04-30 ENCOUNTER — Ambulatory Visit: Payer: Medicare Other | Admitting: Radiation Oncology

## 2013-04-30 DIAGNOSIS — C3490 Malignant neoplasm of unspecified part of unspecified bronchus or lung: Secondary | ICD-10-CM

## 2013-04-30 DIAGNOSIS — D649 Anemia, unspecified: Secondary | ICD-10-CM

## 2013-04-30 LAB — CBC WITH DIFFERENTIAL/PLATELET
Basophils Relative: 0 % (ref 0–1)
Eosinophils Relative: 0 % (ref 0–5)
HCT: 26 % — ABNORMAL LOW (ref 36.0–46.0)
Hemoglobin: 9.2 g/dL — ABNORMAL LOW (ref 12.0–15.0)
Lymphocytes Relative: 9 % — ABNORMAL LOW (ref 12–46)
Monocytes Relative: 33 % — ABNORMAL HIGH (ref 3–12)
Neutro Abs: 2.7 10*3/uL (ref 1.7–7.7)
RBC: 2.85 MIL/uL — ABNORMAL LOW (ref 3.87–5.11)
WBC: 4.6 10*3/uL (ref 4.0–10.5)

## 2013-04-30 LAB — BASIC METABOLIC PANEL
CO2: 19 mEq/L (ref 19–32)
GFR calc non Af Amer: 77 mL/min — ABNORMAL LOW (ref >90)
Glucose, Bld: 87 mg/dL (ref 70–99)
Potassium: 3.5 mEq/L (ref 3.5–5.1)
Sodium: 138 mEq/L (ref 135–145)

## 2013-04-30 MED ORDER — MORPHINE SULFATE ER 15 MG PO TBCR: 15.0000 mg | EXTENDED_RELEASE_TABLET | Freq: Two times a day (BID) | ORAL | Status: DC

## 2013-04-30 MED ORDER — ONDANSETRON HCL 4 MG PO TABS: 4.0000 mg | ORAL_TABLET | Freq: Four times a day (QID) | ORAL | Status: AC | PRN

## 2013-04-30 MED ORDER — ALBUTEROL SULFATE HFA 108 (90 BASE) MCG/ACT IN AERS: 2.0000 | INHALATION_SPRAY | RESPIRATORY_TRACT | Status: AC | PRN

## 2013-04-30 MED ORDER — LEVOFLOXACIN 750 MG PO TABS: 750.0000 mg | ORAL_TABLET | Freq: Every day | ORAL | Status: DC

## 2013-04-30 MED ORDER — ALUM & MAG HYDROXIDE-SIMETH 200-200-20 MG/5ML PO SUSP: 30.0000 mL | Freq: Four times a day (QID) | ORAL | Status: AC | PRN

## 2013-04-30 MED ORDER — ALBUTEROL SULFATE (2.5 MG/3ML) 0.083% IN NEBU: 2.5000 mg | INHALATION_SOLUTION | RESPIRATORY_TRACT | Status: AC | PRN

## 2013-04-30 MED ORDER — POLYETHYLENE GLYCOL 3350 17 G PO PACK: 17.0000 g | PACK | Freq: Every day | ORAL | Status: AC | PRN

## 2013-04-30 MED ORDER — OXYCODONE-ACETAMINOPHEN 10-325 MG PO TABS: 1.0000 | ORAL_TABLET | Freq: Three times a day (TID) | ORAL | Status: DC | PRN

## 2013-04-30 NOTE — Progress Notes (Signed)
ANTIBIOTIC CONSULT NOTE - Follow Up  Pharmacy Consult for Cefepime Indication: Neutropenia  Allergies  Allergen Reactions  . Latex     "pulls skin off"    Patient Measurements: Wt 66.8kg on 04/08/13  Vital Signs: Temp: 98.3 F (36.8 C) (07/24 0526) Temp src: Oral (07/24 0526) BP: 140/81 mmHg (07/24 0526) Pulse Rate: 87 (07/24 0526) Intake/Output from previous day: 07/23 0701 - 07/24 0700 In: 935 [P.O.:120; I.V.:815] Out: 2900 [Urine:2900] Intake/Output from this shift:    Labs:  Recent Labs  04/28/13 0343 04/29/13 0338 04/30/13 0352  WBC 1.9* 3.7* 4.6  HGB 8.7* 9.0* 9.2*  PLT 13* 16* 20*  CREATININE 1.00 0.82 0.80   CrC; ~60 ml/min/1.73m2 (normalized)  Microbiology: Recent Results (from the past 720 hour(s))  TECHNOLOGIST REVIEW     Status: None   Collection Time    04/08/13  8:20 AM      Result Value Range Status   Technologist Review Metas and Myelocytes present   Final  CULTURE, BLOOD (ROUTINE X 2)     Status: None   Collection Time    04/27/13  1:45 PM      Result Value Range Status   Specimen Description BLOOD LEFT HAND   Final   Special Requests BOTTLES DRAWN AEROBIC ONLY 3 ML   Final   Culture  Setup Time 04/27/2013 22:10   Final   Culture     Final   Value:        BLOOD CULTURE RECEIVED NO GROWTH TO DATE CULTURE WILL BE HELD FOR 5 DAYS BEFORE ISSUING A FINAL NEGATIVE REPORT   Report Status PENDING   Incomplete  CULTURE, BLOOD (ROUTINE X 2)     Status: None   Collection Time    04/27/13  1:50 PM      Result Value Range Status   Specimen Description BLOOD LEFT ANTECUBITAL   Final   Special Requests BOTTLES DRAWN AEROBIC ONLY 3 ML   Final   Culture  Setup Time 04/27/2013 22:09   Final   Culture     Final   Value:        BLOOD CULTURE RECEIVED NO GROWTH TO DATE CULTURE WILL BE HELD FOR 5 DAYS BEFORE ISSUING A FINAL NEGATIVE REPORT   Report Status PENDING   Incomplete  7/21 blood x2: sent  Medical History: Past Medical History  Diagnosis Date   . Atrial fibrillation   . Coronary atherosclerosis of native coronary artery     Nonobstructive, LVEF 60%  . COPD (chronic obstructive pulmonary disease)   . Mixed hyperlipidemia   . Essential hypertension, benign   . Tachycardia-bradycardia syndrome   . Embolism - blood clot     PE  . SOB (shortness of breath) on exertion   . Allergy     Latex  . Asthma   . Anxiety   . Myocardial infarction     12 years ago ?  [name] History of radiation therapy     59 gy fraction 5 eot 07/09/12 right lung  . Lung cancer 04/29/12 dx    RLL needle bx=invasive well diff adenocarcinoma  [facility]  . 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 History of radiation therapy 03/09/2013-04/02/2013    40 gray to left adrenal gland region     Assessment: 86 yof with met NSCLC s/p chemo (carbo/alimta C5) on 7/2 who prresented to ED with N/V. Started empiric cefepime for neutropenia and shivering.  D#4 Cefepime 2g IV q8h  Scr wnl/stable, CrCl 64 ml/min  WBC improved to wnl, ANC 2.7  Afebrile, Bcx x 2 7/21 NGTD  Goal of Therapy:  Dose per renal function  Plan:   Continue cefepime 2g IV q8h  Continue to monitor  Gwen Her PharmD  [phone] 04/30/2013 10:23 AM

## 2013-04-30 NOTE — Discharge Summary (Signed)
Physician Discharge Summary  RAVIN BENDALL ZOX:[id number] DOB: [birth year] DOA: 04/27/2013  PCP: Ernestine Conrad, MD  Admit date: 04/27/2013 Discharge date: 04/30/2013  Recommendations for Outpatient Follow-up:  1. patient was admitted for pancytopenia. Patient required Neupogen for white blood cell count boost. White blood cell count is within normal limits prior to discharge. Hemoglobin is stable at 9.2 prior to discharge. Please note that the platelet count is 20 at the time of discharge which is low but it has been trending up from 13. We have checked with oncology the patient is stable for discharge at this time and further blood count will be monitored in cancer Center. Patient should follow up in cancer Center in about one week from discharge. Oncology to set up the appointment for patient. [phone] - cancer center number please call to confirm the appointment).  2. please note that the patient required morphine IV 4 mg every 3 hours for severe pain. We will continue by mouth version of morphine, MS Contin 15 mg twice daily to provide long acting regimen. In addition, please use Percocet for breakthrough pain. Please avoid using scheduled Percocet dosage.  3. kidney function has been stable and within normal limits throughout his hospital stay  4. potassium and sodium have been stable throughout the hospital stay. You may continue taking potassium supplements as before but please make sure that your blood work is being checked by your primary care physician at least one week after this discharge to ensure the stability.  5. you may continue taking daily aspirin 81 mg a day but please if you notice any bleeding stop this medication immediately.  6. you may continue taking the Crestor 40 mg daily  7. please note that you were on cefepime antibiotic by the hospital stay for febrile neutropenia. No obvious source of infection was found. We will however continue antibiotic treatment with  Levaquin 750 mg daily for additional 6 days on discharge to complete a total of 10 days of treatment with antibiotics.  8. please continue taking folic acid  9. please continue taking your blood pressure medications, bysystolic 2.5 mg daily  Discharge Diagnoses:  Principal Problem:   Pancytopenia due to antineoplastic chemotherapy Active Problems:   CORONARY ATHEROSCLEROSIS NATIVE CORONARY ARTERY   PAF (paroxysmal atrial fibrillation)   COPD (chronic obstructive pulmonary disease)   Essential hypertension, benign   Lung cancer   Hypokalemia   Nausea and vomiting  Discharge Condition: Medically stable for discharge to assisted living facility today. Order placed for home health RN, PT, OT  Diet recommendation: as tolearted  History of present illness:  62 y.o. female with a PMH of atrial fibrillation, CAD, COPD, HTN, metastatic non-small cell lung cancer initially diagnosed as synchronous primary non-small cell lung cancer involving the right middle lobe and lower lobe diagnosed in July of 2013 status post stereotactic radiotherapy to right lung lesions completed on 07/09/2012; whole brain irradiation completed on 12/04/2012 and palliative radiotherapy to the left adrenal gland completed on 03/13/13. Also s/p 5 cycles of Alimta and carboplatin Q 3 weeks under the care of Dr. Arbutus Ped. She was admitted on 04/27/2013 with nausea, vomiting, weakness, rigors and pancytopenia/neutropenia.   Assessment/Plan:   Principal Problem:  Pancytopenia due to antineoplastic chemotherapy  - Neutropenia improving, WBC 1.4 --> 3.7; Neupogen discontinued 04/29/2013 - Hemoglobin stable at 9.0, status post 2 units of packed red blood cells with an appropriate rise in her hemoglobin (6.7 --> 9.0)  - Wound slowly trending up, and 20 today.  DVT prophylaxis was on hold, we used SCDs bilateral because of thrombocytopenia - Please note the dad chest x-ray was negative for pneumonia. Urinalysis was negative for  nitrites or leukocytes. We will however continue empiric cefepime until final results of blood cultures are available. Blood cultures to date are negative. Patient will continue taking Levaquin 750 mg daily for 6 more days on discharge to complete a total of 10 days of antibiotics.  Active Problems:  Nausea and vomiting  - Has improved with antiemetics CORONARY ATHEROSCLEROSIS NATIVE CORONARY ARTERY  - Continue aspirin and statin therapy; continue bystolic  PAF (paroxysmal atrial fibrillation)  - Heart sounds are regular. Continue Tambocor.  COPD (chronic obstructive pulmonary disease)  - stable  - Continue nebulized bronchodilator therapy as needed.  Essential hypertension, benign  - Continue current medications including Tambocor, Bystolic.  Lung cancer with brain metastasis  - management per oncology.  Hypokalemia  - Resolved with supplementation.    Code Status: Full.  Family Communication: None present. Legrand Pitts, brother is emergency contact. informed POA at [name] and they agree with discharge plan   Medical Consultants:  None. Other Consultants:  None. Anti-infectives:  Cefepime 04/27/2013---> 04/30/2013 Levaquin 750 mg PO daily for 6 more days on discharge    Discharge Exam: Filed Vitals:   04/30/13 0526  BP: 140/81  Pulse: 87  Temp: 98.3 F (36.8 C)  Resp: 20   Filed Vitals:   04/29/13 0500 04/29/13 1500 04/29/13 2038 04/30/13 0526  BP: 131/50 102/48 142/70 140/81  Pulse: 80 83 75 87  Temp: 97.7 F (36.5 C) 98.3 F (36.8 C) 98.5 F (36.9 C) 98.3 F (36.8 C)  TempSrc: Oral Oral Oral Oral  Resp: 20 20 16 20   Height:      Weight:      SpO2: 100% 100% 100% 99%    General: Pt is alert, follows commands appropriately, not in acute distress Cardiovascular: Regular rate and rhythm, S1/S2 appreciated Respiratory: Clear to auscultation bilaterally, no wheezing, no crackles, no rhonchi Abdominal: Soft, non tender, non distended, bowel sounds  +, no guarding Extremities: no edema, no cyanosis, pulses palpable bilaterally DP and PT Neuro: Grossly nonfocal  Discharge Instructions  Discharge Orders   Future Appointments Provider Department Dept Phone   05/06/2013 9:45 AM Dava Najjar [facility] [phone]   05/20/2013 10:00 AM [facility] [place] [facility] [phone]   Future Orders Complete By Expires     Call MD for:  difficulty breathing, headache or visual disturbances  As directed     Call MD for:  persistant dizziness or light-headedness  As directed     Call MD for:  persistant nausea and vomiting  As directed     Call MD for:  redness, tenderness, or signs of infection (pain, swelling, redness, odor or green/yellow discharge around incision site)  As directed     Call MD for:  severe uncontrolled pain  As directed     Diet - low sodium heart healthy  As directed     Discharge instructions  As directed     Comments:      1. patient was admitted for pancytopenia. Patient required Neupogen for white blood cell count boost. White blood cell count is within normal limits prior to discharge. Hemoglobin is stable at 9.2 prior to discharge. Please note that the platelet count is 20 at the time of discharge which is low but it has been trending up  from 13. We have checked with oncology the patient is stable for discharge at this time and further blood count will be monitored in cancer Center. Patient should follow up in cancer Center in about one week from discharge. Oncology to set up the appointment for patient. [phone] - cancer center number please call to confirm the appointment). 2. please note that the patient required morphine IV 4 mg every 3 hours for severe pain. We will continue by mouth version of morphine, MS Contin 15 mg twice daily to provide long acting regimen. In addition, please use Percocet for breakthrough pain. Please avoid using scheduled Percocet  dosage. 3. kidney function has been stable and within normal limits throughout his hospital stay 4. potassium and sodium have been stable throughout the hospital stay. You may continue taking potassium supplements as before but please make sure that your blood work is being checked by your primary care physician at least one week after this discharge to ensure the stability. 5. you may continue taking daily aspirin 81 mg a day but please if you notice any bleeding stop this medication immediately. 6. you may continue taking the Crestor 40 mg daily 7. please note that you were on cefepime antibiotic by the hospital stay for febrile neutropenia. No obvious source of infection was found. We will however continue antibiotic treatment with Levaquin 750 mg daily for additional 6 days on discharge to complete a total of 10 days of treatment with antibiotics. 8. please continue taking folic acid 9. please continue taking your blood pressure medications, bysystolic 2.5 mg daily    Increase activity slowly  As directed         Medication List         albuterol 108 (90 BASE) MCG/ACT inhaler  Commonly known as:  PROAIR HFA  Inhale 2 puffs into the lungs every 4 (four) hours as needed for wheezing or shortness of breath.     albuterol (2.5 MG/3ML) 0.083% nebulizer solution  Commonly known as:  PROVENTIL  Take 3 mLs (2.5 mg total) by nebulization every 4 (four) hours as needed for wheezing or shortness of breath.     alum & mag hydroxide-simeth 200-200-20 MG/5ML suspension  Commonly known as:  MAALOX/MYLANTA  Take 30 mLs by mouth every 6 (six) hours as needed.     aspirin EC 81 MG tablet  Take 81 mg by mouth daily.     flecainide 100 MG tablet  Commonly known as:  TAMBOCOR  Take 1 tablet (100 mg total) by mouth 2 (two) times daily.     folic acid 1 MG tablet  Commonly known as:  FOLVITE  Take 1 tablet (1 mg total) by mouth daily.     glucose 4 GM chewable tablet  Chew 4 tablets (16 g total)  by mouth as needed for low blood sugar.     ipratropium 0.02 % nebulizer solution  Commonly known as:  ATROVENT  Take 2.5 mLs (500 mcg total) by nebulization 4 (four) times daily as needed for wheezing (shortness of breath).     levofloxacin 750 MG tablet  Commonly known as:  LEVAQUIN  Take 1 tablet (750 mg total) by mouth daily.     meclizine 25 MG tablet  Commonly known as:  ANTIVERT  Take 25 mg by mouth daily.     MELATONIN ER PO  Take 5 mg by mouth at bedtime.     morphine 15 MG 12 hr tablet  Commonly known as:  MS CONTIN  Take 1 tablet (15 mg total) by mouth 2 (two) times daily.     nebivolol 2.5 MG tablet  Commonly known as:  BYSTOLIC  Take 2.5 mg by mouth daily.     ondansetron 4 MG tablet  Commonly known as:  ZOFRAN  Take 1 tablet (4 mg total) by mouth every 6 (six) hours as needed for nausea.     oxyCODONE-acetaminophen 10-325 MG per tablet  Commonly known as:  PERCOCET  Take 1 tablet by mouth every 8 (eight) hours as needed for pain.     polyethylene glycol packet  Commonly known as:  MIRALAX / GLYCOLAX  Take 17 g by mouth daily as needed.     potassium chloride SA 20 MEQ tablet  Commonly known as:  K-DUR,KLOR-CON  Take 1 tablet (20 mEq total) by mouth daily.     PRESCRIPTION MEDICATION  Chemo: Carbo/Alimta q21days per Dr Arbutus Ped for lung ca; Cycle 5 7/2, Cycle 6 due 7/23     prochlorperazine 10 MG tablet  Commonly known as:  COMPAZINE  Take 1 tablet (10 mg total) by mouth every 6 (six) hours as needed.     rosuvastatin 40 MG tablet  Commonly known as:  CRESTOR  Take 40 mg by mouth daily.     traZODone 100 MG tablet  Commonly known as:  DESYREL  Take 100 mg by mouth at bedtime.           Follow-up Information   Follow up with Ernestine Conrad, MD In 1 week.   Contact information:   [facility] [address] [place] [address] [phone]        The results of significant diagnostics from this hospitalization (including  imaging, microbiology, ancillary and laboratory) are listed below for reference.    Significant Diagnostic Studies: Dg Chest Port 1 View  04/27/2013   *RADIOLOGY REPORT*  Clinical Data: Evaluate for pneumonia.  Fever, cough.  Neutropenia. History of lung cancer, COPD, asthma, atrial fibrillation.  PORTABLE CHEST - 1 VIEW  Comparison: 04/15/2013  Findings: Heart is enlarged.  Left-sided pacemaker leads overlie the right atrium and right ventricle.  Nodular density at the right lung base appears stable radiographically.  No new consolidations or pleural effusions are identified.  IMPRESSION: Persistent right lung base nodule. Cardiomegaly.   Original Report Authenticated By: Norva Pavlov, M.D.    Microbiology: Recent Results (from the past 240 hour(s))  CULTURE, BLOOD (ROUTINE X 2)     Status: None   Collection Time    04/27/13  1:45 PM      Result Value Range Status   Specimen Description BLOOD LEFT HAND   Final   Special Requests BOTTLES DRAWN AEROBIC ONLY 3 ML   Final   Culture  Setup Time 04/27/2013 22:10   Final   Culture     Final   Value:        BLOOD CULTURE RECEIVED NO GROWTH TO DATE CULTURE WILL BE HELD FOR 5 DAYS BEFORE ISSUING A FINAL NEGATIVE REPORT   Report Status PENDING   Incomplete  CULTURE, BLOOD (ROUTINE X 2)     Status: None   Collection Time    04/27/13  1:50 PM      Result Value Range Status   Specimen Description BLOOD LEFT ANTECUBITAL   Final   Special Requests BOTTLES DRAWN AEROBIC ONLY 3 ML   Final   Culture  Setup Time 04/27/2013 22:09   Final   Culture     Final  Value:        BLOOD CULTURE RECEIVED NO GROWTH TO DATE CULTURE WILL BE HELD FOR 5 DAYS BEFORE ISSUING A FINAL NEGATIVE REPORT   Report Status PENDING   Incomplete     Labs: Basic Metabolic Panel:  Recent Labs Lab 04/27/13 1145 04/28/13 0343 04/29/13 0338 04/30/13 0352  NA 137 140 136 138  K 3.0* 3.5 3.5 3.5  CL 103 111 109 108  CO2 20 21 16* 19  GLUCOSE 118* 92 82 87  BUN 12 9 6 6    CREATININE 1.06 1.00 0.82 0.80  CALCIUM 8.8 7.9* 8.2* 8.4   Liver Function Tests:  Recent Labs Lab 04/27/13 1145  AST 22  ALT 12  ALKPHOS 47  BILITOT 0.6  PROT 6.0  ALBUMIN 3.3*    Recent Labs Lab 04/27/13 1145  LIPASE 30   No results found for this basename: AMMONIA,  in the last 168 hours CBC:  Recent Labs Lab 04/27/13 1145 04/28/13 0343 04/29/13 0338 04/30/13 0352  WBC 1.4* 1.9* 3.7* 4.6  NEUTROABS 0.5* 0.9* 2.3 2.7  HGB 6.7* 8.7* 9.0* 9.2*  HCT 19.0* 24.6* 25.5* 26.0*  MCV 89.6 90.1 89.5 91.2  PLT 12* 13* 16* 20*   Cardiac Enzymes: No results found for this basename: CKTOTAL, CKMB, CKMBINDEX, TROPONINI,  in the last 168 hours BNP: BNP (last 3 results) No results found for this basename: PROBNP,  in the last 8760 hours CBG:  Recent Labs Lab 04/27/13 1051  GLUCAP 140*    Time coordinating discharge: Over 30 minutes  Signed:  Manson Passey, MD  [facility]  04/30/2013, 12:02 PM  Pager #: [phone]

## 2013-04-30 NOTE — Progress Notes (Signed)
Physical Therapy Treatment Patient Details Name: KEIDY THURGOOD MRN: [id number] DOB: 1951/08/06 Today's Date: 04/30/2013 Time: 4098-1191 PT Time Calculation (min): 26 min  PT Assessment / Plan / Recommendation  History of Present Illness     Clinical Impression    PT Comments   Assisted pt OOB to amb to BR to void then attempted to amb in hallway however pt was too fatigued after this activity so amb to recliner.  Pt required increased VC's for direction and safety.  Used a RW however pt required MAX instruction on proper use.  Positioned in recliner to comfort. Pt tolerated session well.    Follow Up Recommendations  SNF;Supervision/Assistance - 24 hour (pending family support)     Does the patient have the potential to tolerate intense rehabilitation     Barriers to Discharge        Equipment Recommendations  Rolling walker with 5" wheels    Recommendations for Other Services    Frequency Min 3X/week   Progress towards PT Goals Progress towards PT goals: Progressing toward goals  Plan      Precautions / Restrictions Precautions Precautions: Fall    Pertinent Vitals/Pain RA 94% No c/o pain   Mobility  Bed Mobility Bed Mobility: Supine to Sit Supine to Sit: 5: Supervision Details for Bed Mobility Assistance: increased time Transfers Transfers: Sit to Stand;Stand to Sit Sit to Stand: 4: Min guard;From toilet;From bed;5: Supervision Stand to Sit: 4: Min guard;5: Supervision;To toilet;To chair/3-in-1 Details for Transfer Assistance: 25% VC's on safety and hand placement Ambulation/Gait Ambulation/Gait Assistance: 4: Min guard;4: Min assist Ambulation Distance (Feet): 25 Feet (10', 15' to and from [facility]) Assistive device: Rolling walker Ambulation/Gait Assistance Details: only tolerated amb to and from BR due to mod dyspnea and increased tremors throughout.  RA sats 94% HR 122. Pt also required increased VC's for direction demon delayed response.  Gait Pattern:  Step-through pattern;Trunk flexed Gait velocity: slow     PT Goals (current goals can now be found in the care plan section)    Visit Information  Last PT Received On: 04/30/13 Assistance Needed: +1    Subjective Data      Cognition       Balance     End of Session PT - End of Session Equipment Utilized During Treatment: Gait belt Activity Tolerance: Patient limited by fatigue Patient left: in chair;with call bell/phone within reach   Felecia Shelling  PTA [facility]  Acute  Rehab Pager      [phone]

## 2013-04-30 NOTE — Progress Notes (Signed)
CSW received return phone call from [facility] stating that facility had reviewed patient discharge information and pt can return to facility.  [facility] requested that CSW fax pt hard prescriptions to facility pharmacy, [facility]. CSW faxed hard prescriptions to pharmacy.   CSW provided RN phone number to call report and arranged for ambulance transportation to [facility] (Service Request # [phone]).   CSW discussed with pt at bedside and contacted pt brother and sister-in-law via telephone to notify of transportation being arranged.   No further social work needs identified at this time.  CSW signing off.   Jacklynn Lewis, MSW, LCSWA  Clinical Social Work [phone]

## 2013-04-30 NOTE — Progress Notes (Signed)
CSW faxed pt discharge information to [facility] and awaiting for facility to review discharge information before ambulance transport can be arranged for pt return to [facility].  Pt and pt family updated at bedside.  RN updated.  CSW to facilitate pt discharge needs this afternoon.   Jacklynn Lewis, MSW, LCSWA  Clinical Social Work [phone]

## 2013-05-03 LAB — CULTURE, BLOOD (ROUTINE X 2)
Culture: NO GROWTH
Culture: NO GROWTH

## 2013-05-06 ENCOUNTER — Other Ambulatory Visit: Payer: Self-pay | Admitting: *Deleted

## 2013-05-06 ENCOUNTER — Other Ambulatory Visit (HOSPITAL_BASED_OUTPATIENT_CLINIC_OR_DEPARTMENT_OTHER): Payer: Medicare Other | Admitting: Lab

## 2013-05-06 DIAGNOSIS — C3490 Malignant neoplasm of unspecified part of unspecified bronchus or lung: Secondary | ICD-10-CM

## 2013-05-06 DIAGNOSIS — C342 Malignant neoplasm of middle lobe, bronchus or lung: Secondary | ICD-10-CM

## 2013-05-06 LAB — CBC WITH DIFFERENTIAL/PLATELET
Basophils Absolute: 0 10*3/uL (ref 0.0–0.1)
Eosinophils Absolute: 0 10*3/uL (ref 0.0–0.5)
HGB: 9.7 g/dL — ABNORMAL LOW (ref 11.6–15.9)
MCV: 93.1 fL (ref 79.5–101.0)
MONO#: 1.9 10*3/uL — ABNORMAL HIGH (ref 0.1–0.9)
MONO%: 14.1 % — ABNORMAL HIGH (ref 0.0–14.0)
NEUT#: 11 10*3/uL — ABNORMAL HIGH (ref 1.5–6.5)
Platelets: 73 10*3/uL — ABNORMAL LOW (ref 145–400)
RDW: 19.6 % — ABNORMAL HIGH (ref 11.2–14.5)
WBC: 13.2 10*3/uL — ABNORMAL HIGH (ref 3.9–10.3)

## 2013-05-06 LAB — COMPREHENSIVE METABOLIC PANEL (CC13)
Albumin: 2.7 g/dL — ABNORMAL LOW (ref 3.5–5.0)
Alkaline Phosphatase: 64 U/L (ref 40–150)
BUN: 13.4 mg/dL (ref 7.0–26.0)
CO2: 23 mEq/L (ref 22–29)
Calcium: 8.6 mg/dL (ref 8.4–10.4)
Glucose: 115 mg/dl (ref 70–140)
Potassium: 3.6 mEq/L (ref 3.5–5.1)
Sodium: 141 mEq/L (ref 136–145)
Total Protein: 5.9 g/dL — ABNORMAL LOW (ref 6.4–8.3)

## 2013-05-07 ENCOUNTER — Encounter (HOSPITAL_COMMUNITY): Payer: Self-pay | Admitting: *Deleted

## 2013-05-07 ENCOUNTER — Emergency Department (HOSPITAL_COMMUNITY): Payer: Medicare Other

## 2013-05-07 ENCOUNTER — Inpatient Hospital Stay (HOSPITAL_COMMUNITY)
Admission: EM | Admit: 2013-05-07 | Discharge: 2013-05-11 | DRG: 054 | Disposition: A | Discharge status: Hospice | Payer: Medicare Other | Attending: Internal Medicine | Admitting: Internal Medicine

## 2013-05-07 ENCOUNTER — Other Ambulatory Visit: Payer: Self-pay

## 2013-05-07 DIAGNOSIS — Z515 Encounter for palliative care: Secondary | ICD-10-CM

## 2013-05-07 DIAGNOSIS — R918 Other nonspecific abnormal finding of lung field: Secondary | ICD-10-CM

## 2013-05-07 DIAGNOSIS — I4891 Unspecified atrial fibrillation: Secondary | ICD-10-CM | POA: Diagnosis present

## 2013-05-07 DIAGNOSIS — R410 Disorientation, unspecified: Secondary | ICD-10-CM

## 2013-05-07 DIAGNOSIS — Z87891 Personal history of nicotine dependence: Secondary | ICD-10-CM

## 2013-05-07 DIAGNOSIS — E162 Hypoglycemia, unspecified: Secondary | ICD-10-CM

## 2013-05-07 DIAGNOSIS — D696 Thrombocytopenia, unspecified: Secondary | ICD-10-CM

## 2013-05-07 DIAGNOSIS — C3430 Malignant neoplasm of lower lobe, unspecified bronchus or lung: Secondary | ICD-10-CM | POA: Diagnosis present

## 2013-05-07 DIAGNOSIS — E663 Overweight: Secondary | ICD-10-CM

## 2013-05-07 DIAGNOSIS — D649 Anemia, unspecified: Secondary | ICD-10-CM

## 2013-05-07 DIAGNOSIS — R404 Transient alteration of awareness: Secondary | ICD-10-CM | POA: Diagnosis present

## 2013-05-07 DIAGNOSIS — J438 Other emphysema: Secondary | ICD-10-CM | POA: Diagnosis present

## 2013-05-07 DIAGNOSIS — E875 Hyperkalemia: Secondary | ICD-10-CM

## 2013-05-07 DIAGNOSIS — R0602 Shortness of breath: Secondary | ICD-10-CM

## 2013-05-07 DIAGNOSIS — R0902 Hypoxemia: Secondary | ICD-10-CM | POA: Diagnosis present

## 2013-05-07 DIAGNOSIS — K589 Irritable bowel syndrome without diarrhea: Secondary | ICD-10-CM

## 2013-05-07 DIAGNOSIS — C7970 Secondary malignant neoplasm of unspecified adrenal gland: Secondary | ICD-10-CM

## 2013-05-07 DIAGNOSIS — E871 Hypo-osmolality and hyponatremia: Secondary | ICD-10-CM

## 2013-05-07 DIAGNOSIS — Z66 Do not resuscitate: Secondary | ICD-10-CM | POA: Diagnosis present

## 2013-05-07 DIAGNOSIS — E785 Hyperlipidemia, unspecified: Secondary | ICD-10-CM

## 2013-05-07 DIAGNOSIS — R110 Nausea: Secondary | ICD-10-CM | POA: Diagnosis present

## 2013-05-07 DIAGNOSIS — R571 Hypovolemic shock: Secondary | ICD-10-CM

## 2013-05-07 DIAGNOSIS — I480 Paroxysmal atrial fibrillation: Secondary | ICD-10-CM

## 2013-05-07 DIAGNOSIS — I2510 Atherosclerotic heart disease of native coronary artery without angina pectoris: Secondary | ICD-10-CM

## 2013-05-07 DIAGNOSIS — Z781 Physical restraint status: Secondary | ICD-10-CM | POA: Diagnosis present

## 2013-05-07 DIAGNOSIS — D61810 Antineoplastic chemotherapy induced pancytopenia: Secondary | ICD-10-CM

## 2013-05-07 DIAGNOSIS — I495 Sick sinus syndrome: Secondary | ICD-10-CM

## 2013-05-07 DIAGNOSIS — G8929 Other chronic pain: Secondary | ICD-10-CM | POA: Diagnosis present

## 2013-05-07 DIAGNOSIS — C342 Malignant neoplasm of middle lobe, bronchus or lung: Secondary | ICD-10-CM

## 2013-05-07 DIAGNOSIS — D630 Anemia in neoplastic disease: Secondary | ICD-10-CM | POA: Diagnosis present

## 2013-05-07 DIAGNOSIS — R4182 Altered mental status, unspecified: Secondary | ICD-10-CM | POA: Diagnosis present

## 2013-05-07 DIAGNOSIS — F17200 Nicotine dependence, unspecified, uncomplicated: Secondary | ICD-10-CM

## 2013-05-07 DIAGNOSIS — R112 Nausea with vomiting, unspecified: Secondary | ICD-10-CM

## 2013-05-07 DIAGNOSIS — T380X5A Adverse effect of glucocorticoids and synthetic analogues, initial encounter: Secondary | ICD-10-CM

## 2013-05-07 DIAGNOSIS — C7931 Secondary malignant neoplasm of brain: Principal | ICD-10-CM

## 2013-05-07 DIAGNOSIS — R197 Diarrhea, unspecified: Secondary | ICD-10-CM | POA: Diagnosis present

## 2013-05-07 DIAGNOSIS — Z950 Presence of cardiac pacemaker: Secondary | ICD-10-CM

## 2013-05-07 DIAGNOSIS — R739 Hyperglycemia, unspecified: Secondary | ICD-10-CM

## 2013-05-07 DIAGNOSIS — C3490 Malignant neoplasm of unspecified part of unspecified bronchus or lung: Secondary | ICD-10-CM

## 2013-05-07 DIAGNOSIS — R630 Anorexia: Secondary | ICD-10-CM | POA: Diagnosis present

## 2013-05-07 DIAGNOSIS — J449 Chronic obstructive pulmonary disease, unspecified: Secondary | ICD-10-CM

## 2013-05-07 DIAGNOSIS — R270 Ataxia, unspecified: Secondary | ICD-10-CM

## 2013-05-07 DIAGNOSIS — J441 Chronic obstructive pulmonary disease with (acute) exacerbation: Secondary | ICD-10-CM

## 2013-05-07 DIAGNOSIS — F05 Delirium due to known physiological condition: Secondary | ICD-10-CM

## 2013-05-07 DIAGNOSIS — I10 Essential (primary) hypertension: Secondary | ICD-10-CM

## 2013-05-07 DIAGNOSIS — E43 Unspecified severe protein-calorie malnutrition: Secondary | ICD-10-CM | POA: Diagnosis present

## 2013-05-07 DIAGNOSIS — M25519 Pain in unspecified shoulder: Secondary | ICD-10-CM | POA: Diagnosis present

## 2013-05-07 DIAGNOSIS — Z7901 Long term (current) use of anticoagulants: Secondary | ICD-10-CM

## 2013-05-07 DIAGNOSIS — R21 Rash and other nonspecific skin eruption: Secondary | ICD-10-CM | POA: Diagnosis present

## 2013-05-07 DIAGNOSIS — E876 Hypokalemia: Secondary | ICD-10-CM

## 2013-05-07 DIAGNOSIS — R5381 Other malaise: Secondary | ICD-10-CM | POA: Diagnosis present

## 2013-05-07 DIAGNOSIS — E1110 Type 2 diabetes mellitus with ketoacidosis without coma: Secondary | ICD-10-CM

## 2013-05-07 DIAGNOSIS — C7949 Secondary malignant neoplasm of other parts of nervous system: Principal | ICD-10-CM | POA: Diagnosis present

## 2013-05-07 DIAGNOSIS — D72829 Elevated white blood cell count, unspecified: Secondary | ICD-10-CM

## 2013-05-07 HISTORY — DX: Malignant neoplasm of brain, unspecified: C71.9

## 2013-05-07 LAB — BASIC METABOLIC PANEL
BUN: 20 mg/dL (ref 6–23)
CO2: 25 mEq/L (ref 19–32)
Chloride: 112 mEq/L (ref 96–112)
GFR calc Af Amer: 58 mL/min — ABNORMAL LOW (ref >90)
Potassium: 2.8 mEq/L — ABNORMAL LOW (ref 3.5–5.1)

## 2013-05-07 LAB — CBC WITH DIFFERENTIAL/PLATELET
Basophils Relative: 0 % (ref 0–1)
Eosinophils Relative: 0 % (ref 0–5)
HCT: 26.7 % — ABNORMAL LOW (ref 36.0–46.0)
Hemoglobin: 9 g/dL — ABNORMAL LOW (ref 12.0–15.0)
MCH: 31.7 pg (ref 26.0–34.0)
MCHC: 33.7 g/dL (ref 30.0–36.0)
MCV: 94 fL (ref 78.0–100.0)
Monocytes Absolute: 1.8 10*3/uL — ABNORMAL HIGH (ref 0.1–1.0)
Neutro Abs: 12.6 10*3/uL — ABNORMAL HIGH (ref 1.7–7.7)
Neutrophils Relative %: 84 % — ABNORMAL HIGH (ref 43–77)
RBC: 2.84 MIL/uL — ABNORMAL LOW (ref 3.87–5.11)

## 2013-05-07 LAB — BLOOD GAS, ARTERIAL
Acid-base deficit: 1.4 mmol/L (ref 0.0–2.0)
Bicarbonate: 22.1 mEq/L (ref 20.0–24.0)
O2 Saturation: 95.3 %
Patient temperature: 37
TCO2: 19.2 mmol/L (ref 0–100)
pO2, Arterial: 81 mmHg (ref 80.0–100.0)

## 2013-05-07 LAB — URINALYSIS, ROUTINE W REFLEX MICROSCOPIC
Bilirubin Urine: NEGATIVE
Glucose, UA: NEGATIVE mg/dL
Leukocytes, UA: NEGATIVE
Nitrite: NEGATIVE
Specific Gravity, Urine: >1.03 — ABNORMAL HIGH (ref 1.005–1.030)
pH: 6 (ref 5.0–8.0)

## 2013-05-07 LAB — URINE MICROSCOPIC-ADD ON

## 2013-05-07 LAB — HEPATIC FUNCTION PANEL
Indirect Bilirubin: 0.4 mg/dL (ref 0.3–0.9)
Total Protein: 6 g/dL (ref 6.0–8.3)

## 2013-05-07 LAB — TROPONIN I: Troponin I: <0.3 ng/mL (ref <0.30)

## 2013-05-07 MED ORDER — POTASSIUM CHLORIDE 10 MEQ/100ML IV SOLN
10.0000 meq | INTRAVENOUS | Status: AC
  Administered 2013-05-07 – 2013-05-08 (×3): 10 meq via INTRAVENOUS
  Filled 2013-05-07 (×3): qty 100

## 2013-05-07 MED ORDER — PIPERACILLIN-TAZOBACTAM 3.375 G IVPB
3.3750 g | Freq: Three times a day (TID) | INTRAVENOUS | Status: DC
  Administered 2013-05-08 – 2013-05-09 (×5): 3.375 g via INTRAVENOUS
  Filled 2013-05-07 (×6): qty 50

## 2013-05-07 MED ORDER — DEXAMETHASONE SODIUM PHOSPHATE 10 MG/ML IJ SOLN
10.0000 mg | Freq: Once | INTRAMUSCULAR | Status: AC
  Administered 2013-05-07: 10 mg via INTRAVENOUS
  Filled 2013-05-07: qty 1

## 2013-05-07 MED ORDER — VANCOMYCIN HCL IN DEXTROSE 1-5 GM/200ML-% IV SOLN
1000.0000 mg | Freq: Once | INTRAVENOUS | Status: AC
  Administered 2013-05-08: 1000 mg via INTRAVENOUS
  Filled 2013-05-07: qty 200

## 2013-05-07 MED ORDER — VANCOMYCIN HCL 500 MG IV SOLR
500.0000 mg | Freq: Two times a day (BID) | INTRAVENOUS | Status: DC
  Administered 2013-05-08 (×2): 500 mg via INTRAVENOUS
  Filled 2013-05-07 (×4): qty 500

## 2013-05-07 MED ORDER — SODIUM CHLORIDE 0.9 % IV BOLUS (SEPSIS)
1000.0000 mL | Freq: Once | INTRAVENOUS | Status: AC
  Administered 2013-05-07: 1000 mL via INTRAVENOUS

## 2013-05-07 MED ORDER — VANCOMYCIN HCL IN DEXTROSE 750-5 MG/150ML-% IV SOLN
750.0000 mg | Freq: Two times a day (BID) | INTRAVENOUS | Status: DC
  Filled 2013-05-07 (×3): qty 150

## 2013-05-07 MED ORDER — SODIUM CHLORIDE 0.9 % IV SOLN
1000.0000 mg | Freq: Once | INTRAVENOUS | Status: AC
  Administered 2013-05-08: 1000 mg via INTRAVENOUS
  Filled 2013-05-07 (×2): qty 10

## 2013-05-07 NOTE — ED Notes (Signed)
Patient is asleep on stretcher at this time.

## 2013-05-07 NOTE — Progress Notes (Signed)
ANTIBIOTIC CONSULT NOTE - INITIAL  Pharmacy Consult for Vancomycin & Zosyn Indication: Infection - unknown site  Allergies  Allergen Reactions  . Latex Other (See Comments)    "pulls skin off"    Patient Measurements:     Vital Signs: BP: 86/72 mmHg (07/31 2204) Pulse Rate: 87 (07/31 2204) Intake/Output from previous day:   Intake/Output from this shift:    Labs:  Recent Labs  05/06/13 1008 05/07/13 1953  WBC 13.2* 15.0*  HGB 9.7* 9.0*  PLT 73* 89*  CREATININE 1.0 1.14*   The CrCl is unknown because both a height and weight (above a minimum accepted value) are required for this calculation. No results found for this basename: VANCOTROUGH, VANCOPEAK, VANCORANDOM, GENTTROUGH, GENTPEAK, GENTRANDOM, TOBRATROUGH, TOBRAPEAK, TOBRARND, AMIKACINPEAK, AMIKACINTROU, AMIKACIN,  in the last 72 hours   Microbiology: Recent Results (from the past 720 hour(s))  TECHNOLOGIST REVIEW     Status: None   Collection Time    04/08/13  8:20 AM      Result Value Range Status   Technologist Review Metas and Myelocytes present   Final  CULTURE, BLOOD (ROUTINE X 2)     Status: None   Collection Time    04/27/13  1:45 PM      Result Value Range Status   Specimen Description BLOOD LEFT HAND   Final   Special Requests BOTTLES DRAWN AEROBIC ONLY 3 ML   Final   Culture  Setup Time 04/27/2013 22:10   Final   Culture NO GROWTH 5 DAYS   Final   Report Status 05/03/2013 FINAL   Final  CULTURE, BLOOD (ROUTINE X 2)     Status: None   Collection Time    04/27/13  1:50 PM      Result Value Range Status   Specimen Description BLOOD LEFT ANTECUBITAL   Final   Special Requests BOTTLES DRAWN AEROBIC ONLY 3 ML   Final   Culture  Setup Time 04/27/2013 22:09   Final   Culture NO GROWTH 5 DAYS   Final   Report Status 05/03/2013 FINAL   Final  CULTURE, BLOOD (ROUTINE X 2)     Status: None   Collection Time    05/07/13  7:54 PM      Result Value Range Status   Specimen Description BLOOD LEFT  ANTECUBITAL   Final   Special Requests     Final   Value: BOTTLES DRAWN AEROBIC AND ANAEROBIC AEB 10CC ANA 12CC   Culture PENDING   Incomplete   Report Status PENDING   Incomplete  CULTURE, BLOOD (ROUTINE X 2)     Status: None   Collection Time    05/07/13  8:00 PM      Result Value Range Status   Specimen Description BLOOD LEFT HAND   Final   Special Requests     Final   Value: BOTTLES DRAWN AEROBIC AND ANAEROBIC AEB 7CC ANA 5CC   Culture PENDING   Incomplete   Report Status PENDING   Incomplete    Medical History: Past Medical History  Diagnosis Date  . Atrial fibrillation   . Coronary atherosclerosis of native coronary artery     Nonobstructive, LVEF 60%  . COPD (chronic obstructive pulmonary disease)   . Mixed hyperlipidemia   . Essential hypertension, benign   . Tachycardia-bradycardia syndrome   . Embolism - blood clot     PE  . SOB (shortness of breath) on exertion   . Allergy     Latex  .  Asthma   . Anxiety   . Myocardial infarction     12 years ago ?  [name] History of radiation therapy     59 gy fraction 5 eot 07/09/12 right lung  . 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 History of radiation therapy 03/09/2013-04/02/2013    40 gray to left adrenal gland region  . Lung cancer 04/29/12 dx    RLL needle bx=invasive well diff adenocarcinoma  [facility]  . Brain cancer     Medications:  Scheduled:   Assessment: History of cancer, unknown site of possible infection  Patient weight approximately 66 kg Labs reviwed SCR 1.14 Calculated CrCl approximately 50 ml/min  Goal of Therapy:  Vancomycin trough level 15-20 mcg/ml  Plan:  Vancomycin 1 GM IV loading dose, then 500 mg IV every 12 hours Zosyn 3.375 Gm IV every 8 hours, infuse over 4 hours Recalculate dosing/frequency with updated weight Vancomycin trough at steady state Monitor renal function Labs per protocol  Raquel James, Jarvis Knodel Bennett 05/07/2013,10:20 PM

## 2013-05-07 NOTE — ED Notes (Addendum)
Pt c/o decreased appetite and n/d x 3 wks. Pt has to be asked questions multiple times for her to answer you. Most answers are incoherent.

## 2013-05-07 NOTE — ED Provider Notes (Signed)
CSN: [id number]     Arrival date & time 05/07/13  1856 History     First MD Initiated Contact with Patient 05/07/13 1905     Chief Complaint  Patient presents with  . Nausea  . Diarrhea  . decreased appetite    (Consider location/radiation/quality/duration/timing/severity/associated sxs/prior Treatment) Patient is a 62 y.o. female presenting with altered mental status. The history is limited by the condition of the patient (LEVEL 5 Caveat).  Altered Mental Status Presenting symptoms: confusion and disorientation   Severity:  Unable to specify Episode history:  Unable to specify Timing:  Unable to specify Context comment:  Hx of cancer Associated symptoms: decreased appetite and nausea   Associated symptoms comment:  Diarrhea   Past Medical History  Diagnosis Date  . Atrial fibrillation   . Coronary atherosclerosis of native coronary artery     Nonobstructive, LVEF 60%  . COPD (chronic obstructive pulmonary disease)   . Mixed hyperlipidemia   . Essential hypertension, benign   . Tachycardia-bradycardia syndrome   . Embolism - blood clot     PE  . SOB (shortness of breath) on exertion   . Allergy     Latex  . Asthma   . Anxiety   . Myocardial infarction     12 years ago ?  [name] History of radiation therapy     59 gy fraction 5 eot 07/09/12 right lung  . 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 History of radiation therapy 03/09/2013-04/02/2013    40 gray to left adrenal gland region  . Lung cancer 04/29/12 dx    RLL needle bx=invasive well diff adenocarcinoma  [facility]  . Brain cancer    Past Surgical History  Procedure Laterality Date  . Appendectomy    . Insert / replace / remove pacemaker  3/11     [employer] - Dr. [name]  . Video bronchoscopy  02/22/2012    Procedure: VIDEO BRONCHOSCOPY WITH FLUORO;  Surgeon: Nyoka Cowden, MD;  Location: [facility];  Service: Cardiopulmonary;  Laterality: Bilateral;  . Lung biopsy  04/29/12    RLL invasive well  diff adenocarcinoma  . Lung biopsy  02/22/12    RML=benign lung parenchyma,no tumor sen  . Tonsillectomy and adenoidectomy      age 22  . Total abdominal hysterectomy  1975    1/2 right ovary intact, left salpingo-oppherctomy  . Mr brain w wo contrast  11/17/2012       . Esophagogastroduodenoscopy N/A 01/15/2013    Procedure: ESOPHAGOGASTRODUODENOSCOPY (EGD);  Surgeon: Corbin Ade, MD;  Location: [facility];  Service: Endoscopy;  Laterality: N/A;   Family History  Problem Relation Age of Onset  . Hypertension Mother   . Emphysema Brother     smoker  . Asthma Father   . Asthma Brother   . Lung cancer Paternal Grandmother     was a smoker  . Cancer Maternal Grandmother     lung, dx early 27's, died 3 days after lobectomy   History  Substance Use Topics  . Smoking status: Former Smoker -- 0.25 packs/day for 43 years    Types: Cigarettes    Quit date: 10/08/2012  . Smokeless tobacco: Former Neurosurgeon     Comment: quit 2 weeks ago after smoking since age 62. Up to 1 pack a day  . Alcohol Use: No   OB History   Grav Para Term Preterm Abortions TAB SAB Ect Mult Living  Review of Systems  Unable to perform ROS: Mental status change  Constitutional: Positive for decreased appetite.  Gastrointestinal: Positive for nausea.  Psychiatric/Behavioral: Positive for confusion and altered mental status.    Allergies  Latex  Home Medications   Current Outpatient Rx  Name  Route  Sig  Dispense  Refill  . albuterol (PROAIR HFA) 108 (90 BASE) MCG/ACT inhaler   Inhalation   Inhale 2 puffs into the lungs every 4 (four) hours as needed for wheezing or shortness of breath.   1 Inhaler   1   . albuterol (PROVENTIL) (2.5 MG/3ML) 0.083% nebulizer solution   Nebulization   Take 3 mLs (2.5 mg total) by nebulization every 4 (four) hours as needed for wheezing or shortness of breath.   75 mL   1   . alum & mag hydroxide-simeth (MAALOX/MYLANTA) 200-200-20 MG/5ML suspension    Oral   Take 30 mLs by mouth every 6 (six) hours as needed.   355 mL   0   . aspirin EC 81 MG tablet   Oral   Take 81 mg by mouth daily.         . flecainide (TAMBOCOR) 100 MG tablet   Oral   Take 1 tablet (100 mg total) by mouth 2 (two) times daily.   60 tablet   5   . folic acid (FOLVITE) 1 MG tablet   Oral   Take 1 tablet (1 mg total) by mouth daily.   30 tablet   2   . glucose 4 GM chewable tablet   Oral   Chew 4 tablets (16 g total) by mouth as needed for low blood sugar.   50 tablet   12   . ipratropium (ATROVENT) 0.02 % nebulizer solution   Nebulization   Take 2.5 mLs (500 mcg total) by nebulization 4 (four) times daily as needed for wheezing (shortness of breath).   75 mL   1   . levofloxacin (LEVAQUIN) 750 MG tablet   Oral   Take 1 tablet (750 mg total) by mouth daily.   6 tablet   0   . meclizine (ANTIVERT) 25 MG tablet   Oral   Take 25 mg by mouth daily.          [name] MELATONIN ER PO   Oral   Take 5 mg by mouth at bedtime.          [name] morphine (MS CONTIN) 15 MG 12 hr tablet   Oral   Take 1 tablet (15 mg total) by mouth 2 (two) times daily.   60 tablet   0   . nebivolol (BYSTOLIC) 2.5 MG tablet   Oral   Take 2.5 mg by mouth daily.         . ondansetron (ZOFRAN) 4 MG tablet   Oral   Take 1 tablet (4 mg total) by mouth every 6 (six) hours as needed for nausea.   20 tablet   0   . oxyCODONE-acetaminophen (PERCOCET) 10-325 MG per tablet   Oral   Take 1 tablet by mouth every 8 (eight) hours as needed for pain.   30 tablet   0   . polyethylene glycol (MIRALAX / GLYCOLAX) packet   Oral   Take 17 g by mouth daily as needed.   14 each   0   . potassium chloride SA (K-DUR,KLOR-CON) 20 MEQ tablet   Oral   Take 1 tablet (20 mEq total) by mouth daily.   7 tablet  0   . PRESCRIPTION MEDICATION      Chemo: Carbo/Alimta q21days per Dr Arbutus Ped for lung ca; Cycle 5 7/2, Cycle 6 due 7/23         . prochlorperazine (COMPAZINE) 10 MG  tablet   Oral   Take 1 tablet (10 mg total) by mouth every 6 (six) hours as needed.   30 tablet   0   . rosuvastatin (CRESTOR) 40 MG tablet   Oral   Take 40 mg by mouth daily.         . traZODone (DESYREL) 100 MG tablet   Oral   Take 100 mg by mouth at bedtime.          There were no vitals taken for this visit. Physical Exam  Nursing note and vitals reviewed. Constitutional: She is oriented to person, place, and time. She appears well-developed and well-nourished. No distress.  HENT:  Head: Normocephalic and atraumatic.  Mouth/Throat: Oropharynx is clear and moist.  Eyes: Conjunctivae are normal. Pupils are equal, round, and reactive to light. No scleral icterus.  Neck: Neck supple.  Cardiovascular: Normal rate, regular rhythm, normal heart sounds and intact distal pulses.   No murmur heard. Pulmonary/Chest: Effort normal and breath sounds normal. No stridor. No respiratory distress. She has no rales.  Abdominal: Soft. Bowel sounds are normal. She exhibits no distension. There is no tenderness.  Musculoskeletal: Normal range of motion.  Neurological: She is alert and oriented to person, place, and time.  Follows some but not all commands. Moves all extremities. Disoriented, incoherent speech  Skin: Skin is warm and dry. No rash noted.  Psychiatric: She has a normal mood and affect. Her behavior is normal.    ED Course   Procedures (including critical care time)  Labs Reviewed  BLOOD GAS, ARTERIAL - Abnormal; Notable for the following:    pCO2 arterial 32.8 (*)    All other components within normal limits  CBC WITH DIFFERENTIAL - Abnormal; Notable for the following:    WBC 15.0 (*)    RBC 2.84 (*)    Hemoglobin 9.0 (*)    HCT 26.7 (*)    RDW 20.0 (*)    Platelets 89 (*)    Neutrophils Relative % 84 (*)    Lymphocytes Relative 4 (*)    Neutro Abs 12.6 (*)    Lymphs Abs 0.6 (*)    Monocytes Absolute 1.8 (*)    All other components within normal limits   BASIC METABOLIC PANEL - Abnormal; Notable for the following:    Sodium 123 (*)    Potassium 2.8 (*)    Glucose, Bld 127 (*)    Creatinine, Ser 1.14 (*)    Calcium 8.2 (*)    GFR calc non Af Amer 50 (*)    GFR calc Af Amer 58 (*)    All other components within normal limits  HEPATIC FUNCTION PANEL - Abnormal; Notable for the following:    Albumin 2.8 (*)    All other components within normal limits  CULTURE, BLOOD (ROUTINE X 2)  CULTURE, BLOOD (ROUTINE X 2)  LACTIC ACID, PLASMA  TROPONIN I  URINALYSIS, ROUTINE W REFLEX MICROSCOPIC   Ct Head Wo Contrast  05/07/2013   *RADIOLOGY REPORT*  Clinical Data: Altered mental status.  Lung cancer with brain metastasis.  CT HEAD WITHOUT CONTRAST  Technique:  Contiguous axial images were obtained from the base of the skull through the vertex without contrast.  Comparison: January 15, 2013  Findings: There  is heterogeneous density with mass effect replacing the right cerebellum, presumably the patient's known brain metastasis either unchanged or minimally enlarged compared prior exam.  There is chronic diffuse atrophy.  Chronic bilateral periventricular white matter small vessel ischemic changes identified.  There is no hydrocephalus or midline shift.  No acute transcortical infarct is identified.  IMPRESSION: There is heterogeneous density with mass effect replacing the right cerebellum, presumably the patient's known brain metastasis either unchanged or minimally enlarged compared prior exam. No evidence of acute infarct.   Original Report Authenticated By: Sherian Rein, M.D.   Dg Chest Port 1 View  05/07/2013   *RADIOLOGY REPORT*  Clinical Data: Antral fibrillation, COPD  PORTABLE CHEST - 1 VIEW  Comparison: 04/27/2013  Findings: Normal heart size and vascularity.  No CHF or pneumonia. Stable right lower lobe nodular opacity, better visualize by comparison CT.  No superimposed pneumonia or CHF.  No effusion or pneumothorax.  Background COPD/emphysema  noted.  IMPRESSION: No superimposed acute chest process  Persistent right lung nodule   Original Report Authenticated By: Judie Petit. Miles Costain, M.D.  All radiology studies independently viewed by me.     EKG - NSR, rate 91, normal axis, normal intervals, no ST/T changes, similar to prior.   1. Hyponatremia   2. Confusion     MDM  62 yo female with hx of cancer with recent admission for pancytopenia presenting with AMS.  Unable to obtain any history from patient (level 5 caveat).  Workup initiated.    9:53 PM Labs show significant drop in sodium level compared to yesterday.  This is felt to be most likely explanation for her confusion.  CT head shows brain met of comparable to slightly increased size compared to prior.  She has a leukocytosis, but meets no other SIRS criteria.  However, given unclear history, will also cover with Vanco and Zosyn.  I discussed case with Dr. Sharl Ma ([facility]) who will admit and facilitate transfer to [facility] stepdown unit for more specialized oncology care and at family request.    Candyce Churn, MD 05/07/13 2154

## 2013-05-07 NOTE — H&P (Addendum)
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 Patient was discharged to assisted living facility and today was brought for confusion. Patient is confused and unable to provide any significant history. She is full code. Patient also found to be hyponatremic and hypokalemic. As per nursing staff patient had decreased appetite with nausea and diarrhea for 3 weeks.   Allergies:   Allergies  Allergen Reactions  . Latex Other (See Comments)    "pulls skin off"      Past Medical History  Diagnosis Date  . Atrial fibrillation   . Coronary atherosclerosis of native coronary artery     Nonobstructive, LVEF 60%  . COPD (chronic obstructive pulmonary disease)   . Mixed hyperlipidemia   . Essential hypertension, benign   . Tachycardia-bradycardia syndrome   . Embolism - blood clot     PE  . SOB (shortness of breath) on exertion   . Allergy     Latex  . Asthma   . Anxiety   . Myocardial infarction     12 years ago ?  [name] History of radiation therapy     59 gy fraction 5 eot 07/09/12 right lung  . 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 History of radiation therapy 03/09/2013-04/02/2013    40 gray to left adrenal gland region  . Lung cancer 04/29/12 dx    RLL needle bx=invasive well diff adenocarcinoma  [facility]  . Brain cancer     Past Surgical History  Procedure Laterality Date  . Appendectomy    . Insert / replace / remove pacemaker  3/11     St. Jude - Dr. [name]  . Video bronchoscopy  02/22/2012    Procedure: VIDEO BRONCHOSCOPY WITH FLUORO;  Surgeon: Nyoka Cowden, MD;  Location: [facility];  Service: Cardiopulmonary;  Laterality: Bilateral;  . Lung biopsy  04/29/12     RLL invasive well diff adenocarcinoma  . Lung biopsy  02/22/12    RML=benign lung parenchyma,no tumor sen  . Tonsillectomy and adenoidectomy      age 45  . Total abdominal hysterectomy  1975    1/2 right ovary intact, left salpingo-oppherctomy  . Mr brain w wo contrast  11/17/2012       . Esophagogastroduodenoscopy N/A 01/15/2013    Procedure: ESOPHAGOGASTRODUODENOSCOPY (EGD);  Surgeon: Corbin Ade, MD;  [facility];  Service: Endoscopy;  Laterality: N/A;    Prior to Admission medications   Medication Sig Start Date End Date Taking? Authorizing Provider  albuterol (PROAIR HFA) 108 (90 BASE) MCG/ACT inhaler Inhale 2 puffs into the lungs every 4 (four) hours as needed for wheezing or shortness of breath. 04/30/13  Yes Alison Murray, MD  albuterol (PROVENTIL) (2.5 MG/3ML) 0.083% nebulizer solution Take 3 mLs (2.5 mg total) by nebulization every 4 (four) hours as needed for wheezing or shortness of breath. 04/30/13  Yes Alison Murray, MD  alum & mag hydroxide-simeth (MAALOX/MYLANTA) 200-200-20 MG/5ML suspension Take 30 mLs by mouth every 6 (six) hours as needed. 04/30/13  Yes Alison Murray, MD  aspirin EC 81 MG tablet Take 81 mg by mouth daily.   Yes Historical Provider, MD  flecainide (TAMBOCOR) 100 MG tablet Take 1 tablet (  100 mg total) by mouth 2 (two) times daily. 01/20/13  Yes Hollice Espy, MD  folic acid (FOLVITE) 1 MG tablet Take 1 tablet (1 mg total) by mouth daily. 01/20/13  Yes Hollice Espy, MD  levofloxacin (LEVAQUIN) 750 MG tablet Take 1 tablet (750 mg total) by mouth daily. 04/30/13  Yes Alison Murray, MD  meclizine (ANTIVERT) 25 MG tablet Take 25 mg by mouth daily.    Yes Historical Provider, MD  MELATONIN ER PO Take 5 mg by mouth at bedtime.    Yes Historical Provider, MD  morphine (MS CONTIN) 15 MG 12 hr tablet Take 1 tablet (15 mg total) by mouth 2 (two) times daily. 04/30/13  Yes Alison Murray, MD  nebivolol (BYSTOLIC) 2.5 MG tablet Take 2.5 mg by mouth daily.  01/20/13  Yes Hollice Espy, MD  potassium chloride SA (K-DUR,KLOR-CON) 20 MEQ tablet Take 1 tablet (20 mEq total) by mouth daily. 04/16/13  Yes Si Gaul, MD  PRESCRIPTION MEDICATION Chemo: Carbo/Alimta [phone] per Dr Arbutus Ped for lung ca; Cycle 5 7/2, Cycle 6 due 7/23   Yes Historical Provider, MD  prochlorperazine (COMPAZINE) 10 MG tablet Take 1 tablet (10 mg total) by mouth every 6 (six) hours as needed. 03/27/13  Yes Jonna Coup, MD  rosuvastatin (CRESTOR) 40 MG tablet Take 40 mg by mouth daily. 01/20/13  Yes Hollice Espy, MD  traZODone (DESYREL) 100 MG tablet Take 100 mg by mouth at bedtime.   Yes Historical Provider, MD  glucose 4 GM chewable tablet Chew 4 tablets (16 g total) by mouth as needed for low blood sugar. 01/20/13   Hollice Espy, MD  ipratropium (ATROVENT) 0.02 % nebulizer solution Take 2.5 mLs (500 mcg total) by nebulization 4 (four) times daily as needed for wheezing (shortness of breath). 01/20/13   Hollice Espy, MD  ondansetron (ZOFRAN) 4 MG tablet Take 1 tablet (4 mg total) by mouth every 6 (six) hours as needed for nausea. 04/30/13   Alison Murray, MD  oxyCODONE-acetaminophen (PERCOCET) 10-325 MG per tablet Take 1 tablet by mouth every 8 (eight) hours as needed for pain. 04/30/13   Alison Murray, MD  polyethylene glycol [facility] / Ethelene Hal) packet Take 17 g by mouth daily as needed. 04/30/13   Alison Murray, MD    Social History:  reports that she quit smoking about 6 months ago. Her smoking use included Cigarettes. She has a 10.75 pack-year smoking history. She has quit using smokeless tobacco. She reports that she does not drink alcohol or use illicit drugs.  Family History  Problem Relation Age of Onset  . Hypertension Mother   . Emphysema Brother     smoker  . Asthma Father   . Asthma Brother   . Lung cancer Paternal Grandmother     was a smoker  . Cancer Maternal Grandmother     lung, dx early 35's, died 3 days after lobectomy       Review of Systems:  Unable to obtain due to patient's confusion  Physical Exam: Blood pressure 86/72, pulse 87, resp. rate 15, SpO2 100.00%. Constitutional:   Patient is a well-developed and well-nourished female in no acute distress and cooperative with exam. Head: Normocephalic and atraumatic Mouth: Mucus membranes moist Eyes: PERRL, EOMI, conjunctivae normal Neck: Supple, No Thyromegaly Cardiovascular: RRR, S1 normal, S2 normal Pulmonary/Chest: CTAB, no wheezes, rales, or rhonchi Abdominal: Soft. Non-tender, non-distended, bowel sounds are normal, no masses, organomegaly, or guarding present.  Neurological:  Confused, though follows commands,  Strenght is normal and symmetric bilaterally, cranial nerve II-XII are grossly intact, no focal motor deficit, sensory intact to light touch bilaterally.  Extremities : No Cyanosis, Clubbing or Edema   Labs on Admission:  Results for orders placed during the hospital encounter of 05/07/13 (from the past 48 hour(s))  BLOOD GAS, ARTERIAL     Status: Abnormal   Collection Time    05/07/13  7:45 PM      Result Value Range   O2 Content 21.0     Delivery systems ROOM AIR     pH, Arterial 7.444  7.350 - 7.450   pCO2 arterial 32.8 (*) 35.0 - 45.0 mmHg   pO2, Arterial 81.0  80.0 - 100.0 mmHg   Bicarbonate 22.1  20.0 - 24.0 mEq/L   TCO2 19.2  0 - 100 mmol/L   Acid-base deficit 1.4  0.0 - 2.0 mmol/L   O2 Saturation 95.3     Patient temperature 37.0     Collection site RIGHT RADIAL     Drawn by [id number]     Sample type ARTERIAL     Allens test (pass/fail) PASS  PASS  CBC WITH DIFFERENTIAL     Status: Abnormal   Collection Time    05/07/13  7:53 PM      Result Value Range   WBC 15.0 (*) 4.0 - 10.5 K/uL   RBC 2.84 (*) 3.87 - 5.11 MIL/uL   Hemoglobin 9.0 (*) 12.0 - 15.0 g/dL   HCT 16.1 (*) 09.6 - 04.5 %   MCV 94.0  78.0 - 100.0 fL   MCH 31.7  26.0 - 34.0 pg   MCHC 33.7  30.0 - 36.0 g/dL   RDW 40.9 (*) 81.1 - 91.4 %   Platelets 89 (*)  150 - 400 K/uL   Neutrophils Relative % 84 (*) 43 - 77 %   Lymphocytes Relative 4 (*) 12 - 46 %   Monocytes Relative 12  3 - 12 %   Eosinophils Relative 0  0 - 5 %   Basophils Relative 0  0 - 1 %   Neutro Abs 12.6 (*) 1.7 - 7.7 K/uL   Lymphs Abs 0.6 (*) 0.7 - 4.0 K/uL   Monocytes Absolute 1.8 (*) 0.1 - 1.0 K/uL   Eosinophils Absolute 0.0  0.0 - 0.7 K/uL   Basophils Absolute 0.0  0.0 - 0.1 K/uL   RBC Morphology POLYCHROMASIA PRESENT     WBC Morphology MILD LEFT SHIFT (1-5% METAS, OCC MYELO, OCC BANDS)     Comment: TOXIC GRANULATION     DOHLE BODIES   Smear Review PLATELET COUNT CONFIRMED BY SMEAR     Comment: PLATELETS APPEAR DECREASED  BASIC METABOLIC PANEL     Status: Abnormal   Collection Time    05/07/13  7:53 PM      Result Value Range   Sodium 123 (*) 135 - 145 mEq/L   Potassium 2.8 (*) 3.5 - 5.1 mEq/L   Chloride 112  96 - 112 mEq/L   CO2 25  19 - 32 mEq/L   Glucose, Bld 127 (*) 70 - 99 mg/dL   BUN 20  6 - 23 mg/dL   Creatinine, Ser 7.82 (*) 0.50 - 1.10 mg/dL   Calcium 8.2 (*) 8.4 - 10.5 mg/dL   GFR calc non Af Amer 50 (*) >90 mL/min   GFR calc Af Amer 58 (*) >90 mL/min   Comment:  The eGFR has been calculated     using the CKD EPI equation.     This calculation has not been     validated in all clinical     situations.     eGFR's persistently     <90 mL/min signify     possible Chronic Kidney Disease.  LACTIC ACID, PLASMA     Status: None   Collection Time    05/07/13  7:53 PM      Result Value Range   Lactic Acid, Venous 1.3  0.5 - 2.2 mmol/L  TROPONIN I     Status: None   Collection Time    05/07/13  7:53 PM      Result Value Range   Troponin I <0.30  <0.30 ng/mL   Comment:            Due to the release kinetics of cTnI,     a negative result within the first hours     of the onset of symptoms does not rule out     myocardial infarction with certainty.     If myocardial infarction is still suspected,     repeat the test at appropriate  intervals.  HEPATIC FUNCTION PANEL     Status: Abnormal   Collection Time    05/07/13  7:53 PM      Result Value Range   Total Protein 6.0  6.0 - 8.3 g/dL   Albumin 2.8 (*) 3.5 - 5.2 g/dL   AST 23  0 - 37 U/L   ALT 10  0 - 35 U/L   Alkaline Phosphatase 70  39 - 117 U/L   Total Bilirubin 0.6  0.3 - 1.2 mg/dL   Bilirubin, Direct 0.2  0.0 - 0.3 mg/dL   Indirect Bilirubin 0.4  0.3 - 0.9 mg/dL  CULTURE, BLOOD (ROUTINE X 2)     Status: None   Collection Time    05/07/13  7:54 PM      Result Value Range   Specimen Description BLOOD LEFT ANTECUBITAL     Special Requests       Value: BOTTLES DRAWN AEROBIC AND ANAEROBIC AEB 10CC ANA 12CC   Culture PENDING     Report Status PENDING    CULTURE, BLOOD (ROUTINE X 2)     Status: None   Collection Time    05/07/13  8:00 PM      Result Value Range   Specimen Description BLOOD LEFT HAND     Special Requests       Value: BOTTLES DRAWN AEROBIC AND ANAEROBIC AEB 7CC ANA 5CC   Culture PENDING     Report Status PENDING      Radiological Exams on Admission: Ct Head Wo Contrast  05/07/2013   *RADIOLOGY REPORT*  Clinical Data: Altered mental status.  Lung cancer with brain metastasis.  CT HEAD WITHOUT CONTRAST  Technique:  Contiguous axial images were obtained from the base of the skull through the vertex without contrast.  Comparison: January 15, 2013  Findings: There is heterogeneous density with mass effect replacing the right cerebellum, presumably the patient's known brain metastasis either unchanged or minimally enlarged compared prior exam.  There is chronic diffuse atrophy.  Chronic bilateral periventricular white matter small vessel ischemic changes identified.  There is no hydrocephalus or midline shift.  No acute transcortical infarct is identified.  IMPRESSION: There is heterogeneous density with mass effect replacing the right cerebellum, presumably the patient's known brain metastasis either unchanged or minimally enlarged  compared prior exam.  No evidence of acute infarct.   Original Report Authenticated By: Sherian Rein, M.D.   Dg Chest Port 1 View  05/07/2013   *RADIOLOGY REPORT*  Clinical Data: Antral fibrillation, COPD  PORTABLE CHEST - 1 VIEW  Comparison: 04/27/2013  Findings: Normal heart size and vascularity.  No CHF or pneumonia. Stable right lower lobe nodular opacity, better visualize by comparison CT.  No superimposed pneumonia or CHF.  No effusion or pneumothorax.  Background COPD/emphysema noted.  IMPRESSION: No superimposed acute chest process  Persistent right lung nodule   Original Report Authenticated By: Judie Petit. Miles Costain, M.D.    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 No  evidence of acute infarct.  Patient will be given Decadron 10 mg IV, and Keppra 1 g IV before transfer Will order Decadron 4 mg IV every 6 hours starting from tomorrow And Keppra 500 IV every 12 hours starting from tomorrow Also patient have underlying infectious process as patient is mildly hypotensive in the ED. She will be given vancomycin and Zosyn before transfer to [facility].  Hyponatremia Could be due to nausea and vomiting versus brain mass We'll obtain urine and serum osmolality Follow serum sodium in the a.m. 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  Arbutus Ped Please call oncology in a.m.  Leukocytosis ? Cause Patient was not on steroids at home Will give vancomycin and Zosyn empirically Blood cultures and urine cultures have been obtained UA and chest x-ray are negative for infection Follow WBC him  COPD Continue nebulizer when necessary  Paroxysmal atrial fibrillation Patient is currently in normal sinus rhythm Continue flecainide DVT prophylaxis SCDs  Code status: Full code  Family discussion: Called and discussed with patient's brother Rudi Rummage on phone  Patient will be transferred to [facility]. Called and discussed with Dr. Manson Passey. Please consult oncology in a.m.  Time Spent on Admission: 75 min  Aquinnah Devin S [facility] Pager: [phone] 05/07/2013, 10:06 PM  If 7PM-7AM, please contact night-coverage  [email or web address]  Password [email or web address]

## 2013-05-07 NOTE — ED Notes (Signed)
Patient is talking in cohernt when asked a question. Patient shaking and asked if she was cold she states that she is low.

## 2013-05-08 DIAGNOSIS — I2510 Atherosclerotic heart disease of native coronary artery without angina pectoris: Secondary | ICD-10-CM

## 2013-05-08 DIAGNOSIS — R5381 Other malaise: Secondary | ICD-10-CM

## 2013-05-08 DIAGNOSIS — C342 Malignant neoplasm of middle lobe, bronchus or lung: Secondary | ICD-10-CM

## 2013-05-08 DIAGNOSIS — D649 Anemia, unspecified: Secondary | ICD-10-CM

## 2013-05-08 DIAGNOSIS — C3430 Malignant neoplasm of lower lobe, unspecified bronchus or lung: Secondary | ICD-10-CM

## 2013-05-08 DIAGNOSIS — C7949 Secondary malignant neoplasm of other parts of nervous system: Principal | ICD-10-CM

## 2013-05-08 DIAGNOSIS — F29 Unspecified psychosis not due to a substance or known physiological condition: Secondary | ICD-10-CM

## 2013-05-08 LAB — COMPREHENSIVE METABOLIC PANEL
ALT: 8 U/L (ref 0–35)
Alkaline Phosphatase: 61 U/L (ref 39–117)
BUN: 19 mg/dL (ref 6–23)
CO2: 24 mEq/L (ref 19–32)
GFR calc Af Amer: 59 mL/min — ABNORMAL LOW (ref >90)
GFR calc non Af Amer: 51 mL/min — ABNORMAL LOW (ref >90)
Glucose, Bld: 132 mg/dL — ABNORMAL HIGH (ref 70–99)
Potassium: 4.1 mEq/L (ref 3.5–5.1)
Sodium: 137 mEq/L (ref 135–145)
Total Bilirubin: 0.4 mg/dL (ref 0.3–1.2)

## 2013-05-08 LAB — OSMOLALITY: Osmolality: 284 mOsm/kg (ref 275–300)

## 2013-05-08 LAB — CBC
HCT: 23.8 % — ABNORMAL LOW (ref 36.0–46.0)
Hemoglobin: 8.2 g/dL — ABNORMAL LOW (ref 12.0–15.0)
RBC: 2.52 MIL/uL — ABNORMAL LOW (ref 3.87–5.11)

## 2013-05-08 MED ORDER — POTASSIUM CHLORIDE IN NACL 20-0.9 MEQ/L-% IV SOLN
INTRAVENOUS | Status: DC
  Administered 2013-05-08 – 2013-05-09 (×3): via INTRAVENOUS
  Filled 2013-05-08 (×6): qty 1000

## 2013-05-08 MED ORDER — MORPHINE SULFATE 2 MG/ML IJ SOLN
1.0000 mg | INTRAMUSCULAR | Status: DC | PRN
  Administered 2013-05-08: 1 mg via INTRAVENOUS
  Filled 2013-05-08: qty 1

## 2013-05-08 MED ORDER — SODIUM CHLORIDE 0.9 % IV SOLN
500.0000 mg | Freq: Two times a day (BID) | INTRAVENOUS | Status: DC
  Administered 2013-05-08 – 2013-05-09 (×3): 500 mg via INTRAVENOUS
  Filled 2013-05-08 (×3): qty 5

## 2013-05-08 MED ORDER — ALBUTEROL SULFATE (5 MG/ML) 0.5% IN NEBU: 2.5000 mg | INHALATION_SOLUTION | RESPIRATORY_TRACT | Status: DC | PRN

## 2013-05-08 MED ORDER — ONDANSETRON HCL 4 MG PO TABS: 4.0000 mg | ORAL_TABLET | Freq: Four times a day (QID) | ORAL | Status: DC | PRN

## 2013-05-08 MED ORDER — HALOPERIDOL LACTATE 5 MG/ML IJ SOLN
0.5000 mg | Freq: Four times a day (QID) | INTRAMUSCULAR | Status: DC | PRN
  Administered 2013-05-08: 1 mg via INTRAVENOUS
  Filled 2013-05-08: qty 1

## 2013-05-08 MED ORDER — LORAZEPAM 2 MG/ML IJ SOLN
1.0000 mg | Freq: Once | INTRAMUSCULAR | Status: AC
  Administered 2013-05-08: 1 mg via INTRAVENOUS
  Filled 2013-05-08: qty 1

## 2013-05-08 MED ORDER — DEXAMETHASONE SODIUM PHOSPHATE 4 MG/ML IJ SOLN
4.0000 mg | Freq: Four times a day (QID) | INTRAMUSCULAR | Status: DC
  Administered 2013-05-08 – 2013-05-09 (×6): 4 mg via INTRAVENOUS
  Filled 2013-05-08 (×9): qty 1

## 2013-05-08 MED ORDER — IPRATROPIUM BROMIDE 0.02 % IN SOLN: 500.0000 ug | Freq: Four times a day (QID) | RESPIRATORY_TRACT | Status: DC | PRN

## 2013-05-08 MED ORDER — ONDANSETRON HCL 4 MG/2ML IJ SOLN: 4.0000 mg | Freq: Four times a day (QID) | INTRAMUSCULAR | Status: DC | PRN

## 2013-05-08 MED ORDER — FLECAINIDE ACETATE 100 MG PO TABS
100.0000 mg | ORAL_TABLET | Freq: Two times a day (BID) | ORAL | Status: DC
  Administered 2013-05-08 – 2013-05-11 (×6): 100 mg via ORAL
  Filled 2013-05-08 (×9): qty 1

## 2013-05-08 MED ORDER — FOLIC ACID 1 MG PO TABS
1.0000 mg | ORAL_TABLET | Freq: Every day | ORAL | Status: DC
  Administered 2013-05-09 – 2013-05-11 (×3): 1 mg via ORAL
  Filled 2013-05-08 (×4): qty 1

## 2013-05-08 NOTE — Progress Notes (Signed)
Called to bedside to answer questions by the family.  The family expressed that they feel that the patient has been progressively declining and they recognize that she has had multiple admissions to the hospital in recent months.  They requested information about hospice care and possible transfer to hospice facility when she is ready for discharge.  I told them that I would contact the social worker to start working on arrangements.  I brought up code status again given their expressions of wanting more comfort measures and so I discussed ventilation/chest compressions and possibility of rib fractures and prolonged ICU stay and [name] (who is NOK with his brother [name] currently) decided DNR/DNI status reflected the patient's best wishes.  I have changed her code status and placed consult for SW to start making arrangements for hospice.  I will notify Dr. Arbutus Ped of the family's desires so that he may speak with them personally if he has any concerns or options for the family.

## 2013-05-08 NOTE — Progress Notes (Signed)
CSW was contacted by MD concerning HCPOA information and Hospice referral.   MD asked CSW to contact family to answer any questions they may have and provide support.   CSW contacted son Raiford Noble [phone]). Pt's son stated that they would like to complete HCPOA paperwork tomorrow and to receive a Residential Hospice listing.   CSW will leave a message for weekend CSW.   Leron Croak, LCSWA [facility] Emergency Dept.  [phone]

## 2013-05-08 NOTE — Evaluation (Signed)
Clinical/Bedside Swallow Evaluation Patient Details  Name: [name] MRN: [id number] Date of Birth: [birth year]  Today's Date: 05/08/2013 Time: 1330-1405 SLP Time Calculation (min): 35 min  Past Medical History:  Past Medical History  Diagnosis Date  . Atrial fibrillation   . Coronary atherosclerosis of native coronary artery     Nonobstructive, LVEF 60%  . COPD (chronic obstructive pulmonary disease)   . Mixed hyperlipidemia   . Essential hypertension, benign   . Tachycardia-bradycardia syndrome   . Embolism - blood clot     PE  . SOB (shortness of breath) on exertion   . Allergy     Latex  . Asthma   . Anxiety   . Myocardial infarction     12 years ago ?  [name] History of radiation therapy     59 gy fraction 5 eot 07/09/12 right lung  . 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 History of radiation therapy 03/09/2013-04/02/2013    40 gray to left adrenal gland region  . Lung cancer 04/29/12 dx    RLL needle bx=invasive well diff adenocarcinoma  [facility]  . Brain cancer    Past Surgical History:  Past Surgical History  Procedure Laterality Date  . Appendectomy    . Insert / replace / remove pacemaker  3/11     St. Jude - Dr. [name]  . Video bronchoscopy  02/22/2012    Procedure: VIDEO BRONCHOSCOPY WITH FLUORO;  Surgeon: Nyoka Cowden, MD;  Location: [facility];  Service: Cardiopulmonary;  Laterality: Bilateral;  . Lung biopsy  04/29/12    RLL invasive well diff adenocarcinoma  . Lung biopsy  02/22/12    RML=benign lung parenchyma,no tumor sen  . Tonsillectomy and adenoidectomy      age 62  . Total abdominal hysterectomy  1975    1/2 right ovary intact, left salpingo-oppherctomy  . Mr brain w wo contrast  11/17/2012       . Esophagogastroduodenoscopy N/A 01/15/2013    Procedure: ESOPHAGOGASTRODUODENOSCOPY (EGD);  Surgeon: Corbin Ade, MD;  [facility];  Service: Endoscopy;  Laterality: N/A;   HPI:  62 yo female adm to [facility] with AMS-  recentlly dc'd from hospital for pantocytopenia.  Pt has small celll lung cancer s/p XRT and chemo, palliative XRT to adrenal.  Pt CXR 7/31 indicated no acute process persistent right nodule.  Pt CT head revealed mass replacing right cerebellum, stable or mildly enlarged.   Order was received for bedside swallow evaluation and diet order as indicated.     Assessment / Plan / Recommendation Clinical Impression  Pt presents with mild dysphagia due deconditioning and cognition.  No s/s of aspiration with po observed but pt is easily distracted and required cueing to stay on task.  Slow effective mastication with crackers x3 noted without residuals, however pt became fatigued during minimal intake.  For energy conservation and aspiration mitigation, recommend soft/ground/thin diet with full assist.  Pt able to self feed with hand over hand assist.  Anticipate intake to be minimal based on evaluation results.    SLP educated pt to findings, recommendations and plan - she agreed to plan.  Note plans for possible hospice referral.    SLP to follow up x1 for family education.      Aspiration Risk  Mild    Diet Recommendation Dysphagia 3 (Mechanical Soft);Thin liquid   Liquid Administration via: Straw;Cup Medication Administration: Whole meds with puree Supervision: Full supervision/cueing for compensatory strategies;Staff feed  patient (hand over hand assist to allow self feed as able) Compensations: Slow rate;Small sips/bites Postural Changes and/or Swallow Maneuvers: Seated upright 90 degrees;Upright 30-60 min after meal    Other  Recommendations Oral Care Recommendations: Oral care before and after PO Other Recommendations: Clarify dietary restrictions   Follow Up Recommendations       Frequency and Duration        Pertinent Vitals/Pain Afebrile, decreased    SLP Swallow Goals Patient will utilize recommended strategies during swallow to increase swallowing safety with: Maximal  cueing   Swallow Study Prior Functional Status   difficulty with collard stems prior to admission per pt    General Date of Onset: 05/08/13 HPI: 62 yo female adm to [facility] with AMS- recentlly dc'd from hospital for pantocytopenia.  Pt has small celll lung cancer s/p XRT and chemo, palliative XRT to adrenal.  Pt CXR 7/31 indicated no acute process persistent right nodule.  Pt CT head revealed mass replacing right cerebellum, stable or mildly enlarged.   Order was received for bedside swallow evaluation and diet order as indicated.   Type of Study: Bedside swallow evaluation Previous Swallow Assessment: none in EPIC Diet Prior to this Study: NPO Temperature Spikes Noted: No Respiratory Status: Room air History of Recent Intubation: No Behavior/Cognition: Alert;Cooperative;Confused;Requires cueing;Decreased sustained attention Oral Cavity - Dentition: Dentures, top;Poor condition (lower poor condition) Self-Feeding Abilities: Needs assist Patient Positioning: Upright in bed Baseline Vocal Quality: Clear;Low vocal intensity Volitional Cough: Weak    Oral/Motor/Sensory Function Overall Oral Motor/Sensory Function:  (generalized weakness, slight lingual deviation, discoordinat) Labial ROM: Reduced left;Reduced right Labial Strength: Reduced Lingual Strength: Reduced Facial Strength: Reduced Velum: Within Functional Limits Mandible: Impaired   Ice Chips Ice chips: Not tested   Thin Liquid Thin Liquid: Within functional limits Presentation: Straw    Nectar Thick Nectar Thick Liquid: Not tested   Honey Thick Honey Thick Liquid: Not tested   Puree Puree: Within functional limits Presentation: Self Fed;Spoon (with hand over hand assist)   Solid   GO    Solid: Within functional limits Presentation: Self Fed Other Comments: graham cracker, slow mastication but functional, pt did become fatigued during intake       Donavan Burnet, MS [facility] SLP [phone]

## 2013-05-08 NOTE — Progress Notes (Signed)
CSW received referral for Residential Hospice Facility. No family at bedside - CSW left message for patient's son, Raiford Noble (ph#: [phone]), awaiting call back.   Unice Bailey, LCSW [facility] Clinical Social Worker cell #: [phone]

## 2013-05-08 NOTE — Progress Notes (Addendum)
DIAGNOSIS: Metastatic non-small cell lung cancer initially diagnosed as synchronous primary non-small cell lung cancer, adenocarcinoma with negative EGFR mutation and negative ALK gene translocation involving the right middle lobe and lower lobe diagnosed in July of 2013.  PRIOR THERAPY:  1) Stereotactic radiotherapy to the right lung lesions under the care of Dr. Mitzi Hansen completed on 07/09/2012.  2) whole brain irradiation under the care of Dr. Mitzi Hansen completed on 12/04/2012.  3) palliative radiotherapy to the left adrenal gland under the care of Dr. Mitzi Hansen expected to be completed on 03/13/13.  CURRENT THERAPY:  Systemic chemotherapy with carboplatin for AUC of 5 and Alimta 500 mg/M2 every 3 weeks. Status post 5 cycles.   Subjective: The patient is seen and examined today. She is so sleepy this morning. She was admitted yesterday with altered mental status. She was recently admitted with pancytopenia which completely resolved. On admission yesterday her sodium is low. The patient was started on IV hydration with normal saline and her sodium today is much better. She has no fever or chills.   Objective: Vital signs in last 24 hours: Temp:  [98.6 F (37 C)-98.8 F (37.1 C)] 98.6 F (37 C) (08/01 0407) Pulse Rate:  [79-102] 79 (08/01 0407) Resp:  [15-36] 17 (08/01 0407) BP: (86-121)/(35-81) 95/56 mmHg (08/01 0407) SpO2:  [83 %-100 %] 97 % (08/01 0407) Weight:  [148 lb 2.4 oz (67.2 kg)] 148 lb 2.4 oz (67.2 kg) (08/01 0100)  Intake/Output from previous day: 07/31 0701 - 08/01 0700 In: 1037.5 [I.V.:375; IV Piggyback:662.5] Out: 275 [Urine:275] Intake/Output this shift:    General appearance: alert, cooperative, distracted, no distress and slowed mentation Resp: clear to auscultation bilaterally Cardio: regular rate and rhythm, S1, S2 normal, no murmur, click, rub or gallop GI: soft, non-tender; bowel sounds normal; no masses,  no organomegaly Extremities: extremities normal, atraumatic, no  cyanosis or edema  Lab Results:   Recent Labs  05/07/13 1953 05/08/13 0333  WBC 15.0* 11.3*  HGB 9.0* 8.2*  HCT 26.7* 23.8*  PLT 89* 81*   BMET  Recent Labs  05/07/13 1953 05/08/13 0333  NA 123* 137  K 2.8* 4.1  CL 112 103  CO2 25 24  GLUCOSE 127* 132*  BUN 20 19  CREATININE 1.14* 1.13*  CALCIUM 8.2* 7.9*    Studies/Results: Ct Head Wo Contrast  05/07/2013   *RADIOLOGY REPORT*  Clinical Data: Altered mental status.  Lung cancer with brain metastasis.  CT HEAD WITHOUT CONTRAST  Technique:  Contiguous axial images were obtained from the base of the skull through the vertex without contrast.  Comparison: January 15, 2013  Findings: There is heterogeneous density with mass effect replacing the right cerebellum, presumably the patient's known brain metastasis either unchanged or minimally enlarged compared prior exam.  There is chronic diffuse atrophy.  Chronic bilateral periventricular white matter small vessel ischemic changes identified.  There is no hydrocephalus or midline shift.  No acute transcortical infarct is identified.  IMPRESSION: There is heterogeneous density with mass effect replacing the right cerebellum, presumably the patient's known brain metastasis either unchanged or minimally enlarged compared prior exam. No evidence of acute infarct.   Original Report Authenticated By: Sherian Rein, M.D.   Dg Chest Port 1 View  05/07/2013   *RADIOLOGY REPORT*  Clinical Data: Antral fibrillation, COPD  PORTABLE CHEST - 1 VIEW  Comparison: 04/27/2013  Findings: Normal heart size and vascularity.  No CHF or pneumonia. Stable right lower lobe nodular opacity, better visualize by comparison CT.  No  superimposed pneumonia or CHF.  No effusion or pneumothorax.  Background COPD/emphysema noted.  IMPRESSION: No superimposed acute chest process  Persistent right lung nodule   Original Report Authenticated By: Judie Petit. Miles Costain, M.D.    Medications: I have reviewed the patient's current  medications.  Assessment/Plan: This is a very pleasant 62 years old white female with metastatic non-small cell lung cancer, adenocarcinoma with multiple brain lesions status post stereotactic radiotherapy followed by whole brain irradiation and currently undergoing systemic chemotherapy with carboplatin and Alimta status post 5 cycles. Over the last several weeks since her brain radiation the patient has been feeling weak and fatigued with occasional confusion. It was worse yesterday. This could be multifactorial secondary to her metastatic brain lesions, overmedication or infection. I will try to limit her pain medication to the minimum amount required. Consider blood culture as well as UA and culture sensitivity. If no improvement in the next 1-2 days, consider repeating an MRI of the brain to rule out leptomeningeal disease. Thank you for taking good care of Ms. Tarlton. I will continue to follow the patient with you and assist in her management an as-needed basis.  LOS: 1 day    Helem Reesor K. 05/08/2013

## 2013-05-08 NOTE — Progress Notes (Addendum)
[facility] PROGRESS NOTE  [name]:[id number] DOB: 1951-08-07 DOA: 05/07/2013 PCP: Kari Conrad, MD  Assessment/Plan Altered mental status head CT demonstrates mass replacing the right cerebellum which is stable or minimally enlarged.   -  Continue Decadron 4 mg IV every 6 hours today  -  Continue Keppra 500 IV every 12 hours -  Rule out infectious etiology.  UA negative, CXR negative, but does have cough -  F/u blood cultures and urine culture -  Continue vanc and zosyn until cultures negative for at least 24 to 48 hours.    -  Minimize narcotics/benzos.  -  Haldol prn agitation -  May continue waist restraint until sitter available.    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   -  Appreciate Dr. Arbutus [name] assistance  Leukocytosis resolved with hydration and antibiotics -  Trend  Normocytic anemia, hgb trending down, likely secondary marrow suppression from acute illness superimposed on anemia of chronic disease from underlying malignancy. -  Monitor for signs of bleeding -  Transfuse for hemoglobin less than 7 or hemodynamic instability with evidence of active bleeding  COPD with cough.   -  Scheduled nebulizer treatments  Paroxysmal atrial fibrillation  Patient is currently in normal sinus rhythm  Continue flecainide No a/c due to brain mets  Diet:  NPO, speech evaluation Access:  PIV IVF:  NS at 56ml/h Proph:  SCDs  Code Status: full code Family Communication: Spoke with the patient's sister-in-law. Her next of kin also her healthcare power of attorney is her brother [name].  He is coming in this afternoon.  I will continue to address CODE STATUS with him.   Disposition Plan:  Okay to transfer to telemetry.     Consultants:  Oncology, Dr. Arbutus [name]  Procedures:  CT  head  CXR  Antibiotics:  Vancomycin  7/31 >>  Zosyn 7/31 >>  HPI/Subjective:  Patient confused.  Denies pain, difficulty breathing, and nausea.   Objective: Filed Vitals:   05/08/13 0040 05/08/13 0100 05/08/13 0407 05/08/13 0800  BP:  117/56 95/56 117/33  Pulse: 98 92 79 76  Temp:  98.8 F (37.1 C) 98.6 F (37 C) 98.2 F (36.8 C)  TempSrc:  Oral Oral Axillary  Resp: 36 23 17 17   Height:  5\' 2"  (1.575 m)    Weight:  67.2 kg (148 lb 2.4 oz)    SpO2: 98% 97% 97% 98%    Intake/Output Summary (Last 24 hours) at 05/08/13 0914 Last data filed at 05/08/13 0837  Gross per 24 hour  Intake 1243.5 ml  Output    275 ml  Net  968.5 ml   Filed Weights   05/08/13 0100  Weight: 67.2 kg (148 lb 2.4 oz)    Exam:   General:  CF, alopecia, No acute distress  HEENT:  NCAT, MMM  Cardiovascular:  RRR, nl S1, S2 no mrg, 2+ pulses, warm extremities  Respiratory:  CTAB, no increased WOB, frequent cough  Abdomen:   NABS, soft, NT/ND  MSK:   Normal tone and bulk, no LEE  Neuro:  Grossly moves all extremities  Data Reviewed: Basic Metabolic Panel:  Recent Labs Lab 05/06/13 1008 05/07/13 1953 05/08/13 0333  NA 141 123* 137  K 3.6 2.8* 4.1  CL  --  112 103  CO2 23 25 24   GLUCOSE 115 127*  132*  BUN 13.4 20 19   CREATININE 1.0 1.14* 1.13*  CALCIUM 8.6 8.2* 7.9*   Liver Function Tests:  Recent Labs Lab 05/06/13 1008 05/07/13 1953 05/08/13 0333  AST 22 23 21   ALT 10 10 8   ALKPHOS 64 70 61  BILITOT 0.61 0.6 0.4  PROT 5.9* 6.0 5.2*  ALBUMIN 2.7* 2.8* 2.5*   No results found for this basename: LIPASE, AMYLASE,  in the last 168 hours No results found for this basename: AMMONIA,  in the last 168 hours CBC:  Recent Labs Lab 05/06/13 1008 05/07/13 1953 05/08/13 0333  WBC 13.2* 15.0* 11.3*  NEUTROABS 11.0* 12.6*  --   HGB 9.7* 9.0* 8.2*  HCT 27.7* 26.7* 23.8*  MCV 93.1 94.0 94.4  PLT 73* 89* 81*   Cardiac Enzymes:  Recent Labs Lab 05/07/13 1953   TROPONINI <0.30   BNP (last 3 results) No results found for this basename: PROBNP,  in the last 8760 hours CBG: No results found for this basename: GLUCAP,  in the last 168 hours  Recent Results (from the past 240 hour(s))  CULTURE, BLOOD (ROUTINE X 2)     Status: None   Collection Time    05/07/13  7:54 PM      Result Value Range Status   Specimen Description BLOOD LEFT ANTECUBITAL   Final   Special Requests     Final   Value: BOTTLES DRAWN AEROBIC AND ANAEROBIC AEB 10CC ANA 12CC   Culture NO GROWTH 1 DAY   Final   Report Status PENDING   Incomplete  CULTURE, BLOOD (ROUTINE X 2)     Status: None   Collection Time    05/07/13  8:00 PM      Result Value Range Status   Specimen Description BLOOD LEFT HAND   Final   Special Requests     Final   Value: BOTTLES DRAWN AEROBIC AND ANAEROBIC AEB 7CC ANA 5CC   Culture NO GROWTH 1 DAY   Final   Report Status PENDING   Incomplete  MRSA PCR SCREENING     Status: None   Collection Time    05/08/13  1:15 AM      Result Value Range Status   MRSA by PCR NEGATIVE  NEGATIVE Final   Comment:            The GeneXpert MRSA Assay (FDA     approved for NASAL specimens     only), is one component of a     comprehensive MRSA colonization     surveillance program. It is not     intended to diagnose MRSA     infection nor to guide or     monitor treatment for     MRSA infections.     Studies: Ct Head Wo Contrast  05/07/2013   *RADIOLOGY REPORT*  Clinical Data: Altered mental status.  Lung cancer with brain metastasis.  CT HEAD WITHOUT CONTRAST  Technique:  Contiguous axial images were obtained from the base of the skull through the vertex without contrast.  Comparison: January 15, 2013  Findings: There is heterogeneous density with mass effect replacing the right cerebellum, presumably the patient's known brain metastasis either unchanged or minimally enlarged compared prior exam.  There is chronic diffuse atrophy.  Chronic bilateral  periventricular white matter small vessel ischemic changes identified.  There is no hydrocephalus or midline shift.  No acute transcortical infarct is identified.  IMPRESSION: There is heterogeneous density with mass effect replacing the right  cerebellum, presumably the patient's known brain metastasis either unchanged or minimally enlarged compared prior exam. No evidence of acute infarct.   Original Report Authenticated By: Sherian Rein, M.D.   Dg Chest Port 1 View  05/07/2013   *RADIOLOGY REPORT*  Clinical Data: Antral fibrillation, COPD  PORTABLE CHEST - 1 VIEW  Comparison: 04/27/2013  Findings: Normal heart size and vascularity.  No CHF or pneumonia. Stable right lower lobe nodular opacity, better visualize by comparison CT.  No superimposed pneumonia or CHF.  No effusion or pneumothorax.  Background COPD/emphysema noted.  IMPRESSION: No superimposed acute chest process  Persistent right lung nodule   Original Report Authenticated By: Judie Petit. Shick, M.D.    Scheduled Meds: . dexamethasone  4 mg Intravenous Q6H  . flecainide  100 mg Oral BID  . folic acid  1 mg Oral Daily  . levETIRAcetam  500 mg Intravenous Q12H  . piperacillin-tazobactam (ZOSYN)  IV  3.375 g Intravenous Q8H  . vancomycin  500 mg Intravenous Q12H   Continuous Infusions: . 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 If 7PM-7AM, please contact night-coverage at [email or web address], password [facility] 05/08/2013, 9:14 AM  LOS: 1 day

## 2013-05-08 NOTE — Progress Notes (Signed)
Comment:  Was asked to see [name] following her transfer from [facility] to [facility] room 1236. [name] is a 62 year old female with history of CAD; COP; atrial fibrillation;  metastatic non-small cell lung cancer with metastases to brain s/p whole brain radiation who was recently discharged from [facility] on 04/30/2013 after she was admitted there for pancytopenia. Patient was discharged to assisted living facility and today was brought in for confusion. Patient was noted to be confused and unable to provide any significant history in ED. Pt also found to be hyponatremic and hypokalemic. Staff at ALF also reported 3 week h/o decreased appetite w/ nausea and diarrhea x 3 weeks. Upon her arrival to room 1236 pt was very confused and restless w/ multiple attempts to get OOB. A waist belt restraint was applied and pt was given Ativan 1mg  IV. Currently at bedside pt is resting quietly in NAD. VSS. Cardiac telemetry reveals NSR w/ rate of 82. Will continue to monitor closely in SDU.   Leanne Chang, NP-C [facility] Pager [phone]

## 2013-05-09 DIAGNOSIS — R112 Nausea with vomiting, unspecified: Secondary | ICD-10-CM

## 2013-05-09 DIAGNOSIS — C801 Malignant (primary) neoplasm, unspecified: Secondary | ICD-10-CM

## 2013-05-09 LAB — BASIC METABOLIC PANEL
CO2: 20 mEq/L (ref 19–32)
Calcium: 7.3 mg/dL — ABNORMAL LOW (ref 8.4–10.5)
Creatinine, Ser: 1.28 mg/dL — ABNORMAL HIGH (ref 0.50–1.10)
GFR calc non Af Amer: 44 mL/min — ABNORMAL LOW (ref >90)
Sodium: 138 mEq/L (ref 135–145)

## 2013-05-09 LAB — CBC
MCH: 31.5 pg (ref 26.0–34.0)
MCV: 94.5 fL (ref 78.0–100.0)
Platelets: 105 10*3/uL — ABNORMAL LOW (ref 150–400)
RBC: 2.54 MIL/uL — ABNORMAL LOW (ref 3.87–5.11)
RDW: 20.5 % — ABNORMAL HIGH (ref 11.5–15.5)
WBC: 15.8 10*3/uL — ABNORMAL HIGH (ref 4.0–10.5)

## 2013-05-09 LAB — URINE CULTURE

## 2013-05-09 MED ORDER — LEVETIRACETAM 500 MG PO TABS
500.0000 mg | ORAL_TABLET | Freq: Two times a day (BID) | ORAL | Status: DC
  Administered 2013-05-09 – 2013-05-11 (×4): 500 mg via ORAL
  Filled 2013-05-09 (×6): qty 1

## 2013-05-09 MED ORDER — DEXAMETHASONE 4 MG PO TABS
4.0000 mg | ORAL_TABLET | Freq: Four times a day (QID) | ORAL | Status: DC
  Administered 2013-05-09 – 2013-05-10 (×4): 4 mg via ORAL
  Filled 2013-05-09 (×9): qty 1

## 2013-05-09 NOTE — Progress Notes (Signed)
Oncology Note. Covering for Dr. Si Gaul  DIAGNOSIS: Metastatic non-small cell lung cancer initially diagnosed as synchronous primary non-small cell lung cancer, adenocarcinoma with negative EGFR mutation and negative ALK gene translocation involving the right middle lobe and lower lobe diagnosed in July of 2013.   PRIOR THERAPY:  1) Stereotactic radiotherapy to the right lung lesions under the care of Dr. Mitzi Hansen completed on 07/09/2012.  2) whole brain irradiation under the care of Dr. Mitzi Hansen completed on 12/04/2012.  3) palliative radiotherapy to the left adrenal gland under the care of Dr. Mitzi Hansen expected to be completed on 03/13/13.   CURRENT THERAPY:  Systemic chemotherapy with carboplatin for AUC of 5 and Alimta 500 mg/M2 every 3 weeks. Status post 5 cycles.   Subjective:  The patient is seen and examined today. She is transferred out of ICU now. No acute events overnight. She denied any complaints; however, she remains with confusion.  It seems that her mental status has slightly improved from her admission. She has no fever or chills.  Objective:  Vital signs  Blood pressure 127/66, pulse 95, temperature 97.6 F (36.4 C), temperature source Oral, resp. rate 22, height 5\' 2"  (1.575 m), weight 148 lb 2.4 oz (67.2 kg), SpO2 100.00%. General appearance: alert to self, cooperative, unable to express her thoughts, no distress and slowed mentation  Resp: clear to auscultation bilaterally  Cardio: regular rate and rhythm, S1, S2 normal, no murmur, click, rub or gallop  GI: soft, non-tender; bowel sounds normal; no masses, no organomegaly  Extremities: extremities normal, atraumatic, no cyanosis or edema    Lab Results:   CBC    Component Value Date/Time   WBC 15.8* 05/09/2013 0502   WBC 13.2* 05/06/2013 1008   RBC 2.54* 05/09/2013 0502   RBC 2.98* 05/06/2013 1008   HGB 8.0* 05/09/2013 0502   HGB 9.7* 05/06/2013 1008   HCT 24.0* 05/09/2013 0502   HCT 27.7* 05/06/2013 1008   PLT 105*  05/09/2013 0502   PLT 73* 05/06/2013 1008   MCV 94.5 05/09/2013 0502   MCV 93.1 05/06/2013 1008   MCH 31.5 05/09/2013 0502   MCH 32.4 05/06/2013 1008   MCHC 33.3 05/09/2013 0502   MCHC 34.8 05/06/2013 1008   RDW 20.5* 05/09/2013 0502   RDW 19.6* 05/06/2013 1008   LYMPHSABS 0.6* 05/07/2013 1953   LYMPHSABS 0.3* 05/06/2013 1008   MONOABS 1.8* 05/07/2013 1953   MONOABS 1.9* 05/06/2013 1008   EOSABS 0.0 05/07/2013 1953   EOSABS 0.0 05/06/2013 1008   BASOSABS 0.0 05/07/2013 1953   BASOSABS 0.0 05/06/2013 1008   BMET    Component Value Date/Time   NA 138 05/09/2013 0502   NA 141 05/06/2013 1008   K 3.9 05/09/2013 0502   K 3.6 05/06/2013 1008   CL 104 05/09/2013 0502   CL 106 04/01/2013 1518   CO2 20 05/09/2013 0502   CO2 23 05/06/2013 1008   GLUCOSE 156* 05/09/2013 0502   GLUCOSE 115 05/06/2013 1008   GLUCOSE 97 04/01/2013 1518   BUN 18 05/09/2013 0502   BUN 13.4 05/06/2013 1008   CREATININE 1.28* 05/09/2013 0502   CREATININE 1.0 05/06/2013 1008   CREATININE 0.80 09/24/2011 1058   CALCIUM 7.3* 05/09/2013 0502   CALCIUM 8.6 05/06/2013 1008   GFRNONAA 44* 05/09/2013 0502   GFRAA 50* 05/09/2013 0502        Studies/Results:  Ct Head Wo Contrast  05/07/2013 *RADIOLOGY REPORT* Clinical Data: Altered mental status. Lung cancer with brain metastasis. CT HEAD WITHOUT  CONTRAST Technique: Contiguous axial images were obtained from the base of the skull through the vertex without contrast. Comparison: January 15, 2013 Findings: There is heterogeneous density with mass effect replacing the right cerebellum, presumably the patient's known brain metastasis either unchanged or minimally enlarged compared prior exam. There is chronic diffuse atrophy. Chronic bilateral periventricular white matter small vessel ischemic changes identified. There is no hydrocephalus or midline shift. No acute transcortical infarct is identified. IMPRESSION: There is heterogeneous density with mass effect replacing the right cerebellum, presumably the patient's  known brain metastasis either unchanged or minimally enlarged compared prior exam. No evidence of acute infarct. Original Report Authenticated By: Sherian Rein, M.D.  Dg Chest Port 1 View  05/07/2013 *RADIOLOGY REPORT* Clinical Data: Antral fibrillation, COPD PORTABLE CHEST - 1 VIEW Comparison: 04/27/2013 Findings: Normal heart size and vascularity. No CHF or pneumonia. Stable right lower lobe nodular opacity, better visualize by comparison CT. No superimposed pneumonia or CHF. No effusion or pneumothorax. Background COPD/emphysema noted. IMPRESSION: No superimposed acute chest process Persistent right lung nodule Original Report Authenticated By: Judie Petit. Miles Costain, M.D.   Medications: I have reviewed the patient's current medications.   Assessment/Plan:  [name] is a very pleasant 62 years old white female with metastatic non-small cell lung cancer, adenocarcinoma with multiple brain lesions status post stereotactic radiotherapy followed by whole brain irradiation and currently undergoing systemic chemotherapy with carboplatin and Alimta status post 5 cycles.  Over the last several weeks since her brain radiation the patient has been feeling weak and fatigued with occasional confusion. Her mental status seems to be slightly better today.   We will try to minimize her meds as much as possible including her pain medication to the minimum amount required.  May need repeating an MRI of the brain to rule out leptomeningeal disease.   Dr. Cyndie Chime will be covering tomorrow 05/10/2013.   Kari Satre E, MD @T @ 2:44 PM

## 2013-05-09 NOTE — Progress Notes (Signed)
Referral made to [facility].    Faxed requested information.  Kim at [place] to contact CSW if additional information is needed.  Providence Crosby, LCSWA Clinical Social Work [phone]

## 2013-05-09 NOTE — Progress Notes (Signed)
Patient ZO:XWRUEAV MARIJANE TROWER      DOB: August 26, 1951      WUJ:[id number]   Received consult for hospice placement . Spoke with Dr. Malachi Bonds to further elucidate reason for consult.  Dr. Malachi Bonds states Goals of care established was looking for physical referral to hospice only.  Affirmed process completed correctly, affirmed no PMT need to interact with family at this time.  Made myself available to further affirm goals or symptom management.   Bryten Maher L. [name], MD MBA The Palliative Medicine Team at [facility] Phone: [phone] Pager: [phone]

## 2013-05-09 NOTE — Progress Notes (Signed)
Clinical Social Work Department BRIEF PSYCHOSOCIAL ASSESSMENT 05/09/2013  Patient:  [name], [name]     Account Number:  [id number]     Admit date:  05/07/2013  Clinical Social Worker:  Doroteo Glassman  Date/Time:  05/09/2013 11:09 AM  Referred by:  Physician  Date Referred:  05/09/2013 Referred for  Residential hospice placement   Other Referral:   Interview type:  Other - See comment Other interview type:   Pt's brother, [name].    PSYCHOSOCIAL DATA Living Status:  FACILITY Admitted from facility:  OTHER Level of care:  Assisted Living Primary support name:  [name] Primary support relationship to patient:  SIBLING Degree of support available:   strong    CURRENT CONCERNS Current Concerns  Post-Acute Placement   Other Concerns:    SOCIAL WORK ASSESSMENT / PLAN Met with Pt and brother, [name], to discuss d/c plans.    Provided Pt's brother with Residential Hospice Facility list.  Pt's brother asking for CSW to explore the Hospice Home in [place], as this is where Pt resides.    Pt's brother asking CSW to [employer] POA documents.  CSW unable to ascertain whether Pt is alert and oriented.  CSW consulted with RN; RN unable to make this decision, as well.  Contacted MD who stated that Pt not alert and oriented and lacks capacity to make decisions/sign documents.    Notified Pt's brother.  Pt's brother voiced an understanding.    Referral made to [facility].  Informed by Selena Batten that there will not likely be a bed available until Monday.    Pt is from [facility].    CSW thanked Pt and her brother for their time.    CSW to follow.   Assessment/plan status:  Psychosocial Support/Ongoing Assessment of Needs Other assessment/ plan:   Information/referral to community resources:   Residential Hospice Facility list    PATIENT'S/FAMILY'S RESPONSE TO PLAN OF CARE: Pt and family coping well and are pleased that there is a  Hospice Home in [place].    Brother thanked CSW for time and assistance.   Providence Crosby, LCSWA Clinical Social Work [phone]

## 2013-05-09 NOTE — Progress Notes (Addendum)
[facility] PROGRESS NOTE  [name] ZOX:[id number] DOB: 1951-08-13 DOA: 05/07/2013 PCP: Ernestine Conrad, MD  Assessment/Plan  Altered mental status head CT demonstrates mass replacing the right cerebellum which is stable or minimally enlarged.   -  Change to Decadron 4 mg by mouth every 6 hours today and plan to taper slowly -  Change to Keppra 500 by mouth every 12 hours -  UA negative, CXR negative, and blood cultures neg x 48 hours -  D/c antibiotics. -  F/u blood cultures and urine culture (drawn post antibiotics) -  Minimize narcotics/benzos.  -  Haldol prn agitation -  May continue waist restraint until sitter available.    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  Appreciate Dr. Arbutus Ped assistance.  May have had some progression of brain metastasis and has also had multiple admissions in the last several months.  She has stopped eating and now has severe protein calorie malnutrition.  Her prognosis is poor and she would be a good candidate for inpatient hospice for management of her nausea, appetite, confusion.  Leukocytosis rising, likely due to steroids  Normocytic anemia, hgb trending down, likely secondary marrow suppression from acute illness superimposed on anemia of chronic disease from underlying malignancy. -  Monitor for signs of bleeding -  Transfuse for hemoglobin less than 7 or hemodynamic instability with evidence of active bleeding  Thrombocytopenia, resolving. Possibly due to marrow suppression from acute illness versus mild DIC  BUN and creatinine rising, possible due to steroids vs. Dehydration from not eating -  Repeat in AM and continue IVF  COPD with cough.   -  Scheduled nebulizer treatments  Paroxysmal atrial fibrillation  Patient is currently in normal sinus rhythm   Continue flecainide No a/c due to brain mets  Poor appetite -  Continue dexamethasone indefinitely for combined effect on cerebral edema and appetite stimulation  Diet:  Dysphagia 3 with thin liquids Access:  PIV IVF:  NS at 7ml/h Proph:  SCDs  Code Status: DNR Family Communication: Spoke to the son regarding healthcare and financial power of attorney and placement in residential hospice  Disposition Plan:  D/c telemetry.  Awaiting hospice arrangements by social work   Consultants:  Oncology, Dr. Arbutus Ped  Procedures:  CT head  CXR  Antibiotics:  Vancomycin  7/31 >> 8/2  Zosyn 7/31 >> 8/2  HPI/Subjective:  Patient confused but more alert and articulate.  Denies pain, shortness of breath, nausea.    Objective: Filed Vitals:   05/08/13 1436 05/08/13 1530 05/08/13 2219 05/09/13 0548  BP:  114/66 130/78 125/66  Pulse: 82 80 85 85  Temp:  97.8 F (36.6 C) 97.5 F (36.4 C) 98.7 F (37.1 C)  TempSrc:  Oral Oral Oral  Resp: 17 21 18 16   Height:      Weight:      SpO2: 100% 100%  100%    Intake/Output Summary (Last 24 hours) at 05/09/13 0938 Last data filed at 05/09/13 0831  Gross per 24 hour  Intake 1332.5 ml  Output    710 ml  Net  622.5 ml   Filed Weights   05/08/13 0100  Weight: 67.2 kg (148 lb 2.4 oz)    Exam:   General:  CF, alopecia, No acute distress, sitting up in chair, alert and smiling  HEENT:  NCAT, MMM  Cardiovascular:  RRR, nl S1, S2 no mrg, 2+ pulses, warm extremities  Respiratory:  CTAB, no increased WOB, frequent cough  Abdomen:   NABS, soft, NT/ND  MSK:   Normal tone and bulk, no LEE  Neuro:  Grossly moves all extremities  Psych: Alert but not oriented to person, place, or time  Data Reviewed: Basic Metabolic Panel:  Recent Labs Lab 05/06/13 1008 05/07/13 1953 05/08/13 0333 05/09/13 0502  NA 141 123* 137 138  K 3.6 2.8* 4.1 3.9  CL  --  112 103 104  CO2 23 25 24 20   GLUCOSE 115 127* 132* 156*  BUN 13.4 20 19  18   CREATININE 1.0 1.14* 1.13* 1.28*  CALCIUM 8.6 8.2* 7.9* 7.3*   Liver Function Tests:  Recent Labs Lab 05/06/13 1008 05/07/13 1953 05/08/13 0333  AST 22 23 21   ALT 10 10 8   ALKPHOS 64 70 61  BILITOT 0.61 0.6 0.4  PROT 5.9* 6.0 5.2*  ALBUMIN 2.7* 2.8* 2.5*   No results found for this basename: LIPASE, AMYLASE,  in the last 168 hours No results found for this basename: AMMONIA,  in the last 168 hours CBC:  Recent Labs Lab 05/06/13 1008 05/07/13 1953 05/08/13 0333 05/09/13 0502  WBC 13.2* 15.0* 11.3* 15.8*  NEUTROABS 11.0* 12.6*  --   --   HGB 9.7* 9.0* 8.2* 8.0*  HCT 27.7* 26.7* 23.8* 24.0*  MCV 93.1 94.0 94.4 94.5  PLT 73* 89* 81* 105*   Cardiac Enzymes:  Recent Labs Lab 05/07/13 1953  TROPONINI <0.30   BNP (last 3 results) No results found for this basename: PROBNP,  in the last 8760 hours CBG: No results found for this basename: GLUCAP,  in the last 168 hours  Recent Results (from the past 240 hour(s))  CULTURE, BLOOD (ROUTINE X 2)     Status: None   Collection Time    05/07/13  7:54 PM      Result Value Range Status   Specimen Description BLOOD LEFT ANTECUBITAL   Final   Special Requests     Final   Value: BOTTLES DRAWN AEROBIC AND ANAEROBIC AEB 10CC ANA 12CC   Culture NO GROWTH 2 DAYS   Final   Report Status PENDING   Incomplete  CULTURE, BLOOD (ROUTINE X 2)     Status: None   Collection Time    05/07/13  8:00 PM      Result Value Range Status   Specimen Description BLOOD LEFT HAND   Final   Special Requests     Final   Value: BOTTLES DRAWN AEROBIC AND ANAEROBIC AEB 7CC ANA 5CC   Culture NO GROWTH 2 DAYS   Final   Report Status PENDING   Incomplete  MRSA PCR SCREENING     Status: None   Collection Time    05/08/13  1:15 AM      Result Value Range Status   MRSA by PCR NEGATIVE  NEGATIVE Final   Comment:            The GeneXpert MRSA Assay (FDA     approved for NASAL specimens     only), is one component of a     comprehensive MRSA  colonization     surveillance program. It is not     intended to diagnose MRSA     infection nor to guide or     monitor treatment for     MRSA infections.     Studies: Ct Head Wo Contrast  05/07/2013   *  RADIOLOGY REPORT*  Clinical Data: Altered mental status.  Lung cancer with brain metastasis.  CT HEAD WITHOUT CONTRAST  Technique:  Contiguous axial images were obtained from the base of the skull through the vertex without contrast.  Comparison: January 15, 2013  Findings: There is heterogeneous density with mass effect replacing the right cerebellum, presumably the patient's known brain metastasis either unchanged or minimally enlarged compared prior exam.  There is chronic diffuse atrophy.  Chronic bilateral periventricular white matter small vessel ischemic changes identified.  There is no hydrocephalus or midline shift.  No acute transcortical infarct is identified.  IMPRESSION: There is heterogeneous density with mass effect replacing the right cerebellum, presumably the patient's known brain metastasis either unchanged or minimally enlarged compared prior exam. No evidence of acute infarct.   Original Report Authenticated By: Sherian Rein, M.D.   Dg Chest Port 1 View  05/07/2013   *RADIOLOGY REPORT*  Clinical Data: Antral fibrillation, COPD  PORTABLE CHEST - 1 VIEW  Comparison: 04/27/2013  Findings: Normal heart size and vascularity.  No CHF or pneumonia. Stable right lower lobe nodular opacity, better visualize by comparison CT.  No superimposed pneumonia or CHF.  No effusion or pneumothorax.  Background COPD/emphysema noted.  IMPRESSION: No superimposed acute chest process  Persistent right lung nodule   Original Report Authenticated By: Judie Petit. Shick, M.D.    Scheduled Meds: . dexamethasone  4 mg Oral Q6H  . flecainide  100 mg Oral BID  . folic acid  1 mg Oral Daily  . levETIRAcetam  500 mg Intravenous Q12H  . piperacillin-tazobactam (ZOSYN)  IV  3.375 g Intravenous Q8H  . vancomycin  500 mg  Intravenous Q12H   Continuous Infusions: . 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 If 7PM-7AM, please contact night-coverage at [email or web address], password [facility] 05/09/2013, 9:38 AM  LOS: 2 days

## 2013-05-10 ENCOUNTER — Inpatient Hospital Stay (HOSPITAL_COMMUNITY): Payer: Medicare Other

## 2013-05-10 DIAGNOSIS — F05 Delirium due to known physiological condition: Secondary | ICD-10-CM

## 2013-05-10 LAB — BASIC METABOLIC PANEL
Calcium: 7.3 mg/dL — ABNORMAL LOW (ref 8.4–10.5)
Creatinine, Ser: 1.14 mg/dL — ABNORMAL HIGH (ref 0.50–1.10)
GFR calc Af Amer: 58 mL/min — ABNORMAL LOW (ref >90)
Sodium: 138 mEq/L (ref 135–145)

## 2013-05-10 LAB — CBC
Platelets: 116 10*3/uL — ABNORMAL LOW (ref 150–400)
RBC: 2.47 MIL/uL — ABNORMAL LOW (ref 3.87–5.11)
RDW: 20.4 % — ABNORMAL HIGH (ref 11.5–15.5)
WBC: 11.5 10*3/uL — ABNORMAL HIGH (ref 4.0–10.5)

## 2013-05-10 MED ORDER — SODIUM CHLORIDE 0.9 % IJ SOLN: 3.0000 mL | INTRAMUSCULAR | Status: DC | PRN

## 2013-05-10 MED ORDER — SODIUM CHLORIDE 0.9 % IV SOLN: 250.0000 mL | INTRAVENOUS | Status: DC | PRN

## 2013-05-10 MED ORDER — ENSURE COMPLETE PO LIQD
237.0000 mL | Freq: Two times a day (BID) | ORAL | Status: DC
  Administered 2013-05-10 – 2013-05-11 (×3): 237 mL via ORAL

## 2013-05-10 MED ORDER — DEXAMETHASONE 4 MG PO TABS
4.0000 mg | ORAL_TABLET | Freq: Three times a day (TID) | ORAL | Status: DC
  Administered 2013-05-10 – 2013-05-11 (×3): 4 mg via ORAL
  Filled 2013-05-10 (×6): qty 1

## 2013-05-10 MED ORDER — SODIUM CHLORIDE 0.9 % IJ SOLN
3.0000 mL | Freq: Two times a day (BID) | INTRAMUSCULAR | Status: DC
  Administered 2013-05-10 – 2013-05-11 (×2): 3 mL via INTRAVENOUS

## 2013-05-10 MED ORDER — MORPHINE SULFATE 15 MG PO TABS
15.0000 mg | ORAL_TABLET | ORAL | Status: DC | PRN
  Administered 2013-05-10 – 2013-05-11 (×4): 15 mg via ORAL
  Filled 2013-05-10 (×4): qty 1

## 2013-05-10 NOTE — Progress Notes (Addendum)
[facility] PROGRESS NOTE  [name] WJX:[id number] DOB: [birth year] DOA: 05/07/2013 PCP: Ernestine Conrad, MD  Assessment/Plan  Altered mental status head CT demonstrates mass replacing the right cerebellum which is stable or minimally enlarged.   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  Appreciate Dr. Arbutus Ped assistance.  May have had some progression of brain metastasis and has also had multiple admissions in the last several months.  She has stopped eating and now has severe protein calorie malnutrition.  Her prognosis is poor and she would be a good candidate for inpatient hospice for management of her nausea, appetite, confusion.  Leukocytosis trending down as steroids are tapered   Normocytic anemia, hgb trending down, likely secondary marrow suppression from acute illness superimposed on anemia of chronic disease from underlying malignancy.  Thrombocytopenia, resolving. Possibly due to marrow suppression from acute illness versus mild DIC  BUN and creatinine now trending down after IVF -  D/c IVF and encourage eating  COPD with episode of tachypnea to the 20s and transient hypoxia to 89%.  Minimal cough.   -  Scheduled nebulizer treatments -  Repeat CXR:  Stable, no evidence of vascular congestion, pulmonary edema, or PNA  Paroxysmal atrial fibrillation  Continue flecainide No a/c due to brain mets  Poor appetite -  Add supplements and encourage  patient to eat  Diet:  Dysphagia 3 with thin liquids Access:  PIV IVF:  OFF Proph:  SCDs  Code Status: DNR Family Communication: Spoke to the son regarding healthcare and financial power of attorney and placement in residential hospice  Disposition Plan:  D/c telemetry.  Awaiting hospice arrangements by social work   Consultants:  Oncology, Dr. Arbutus Ped and Dr. Cyndie Chime  Procedures:  CT head  CXR  Antibiotics:  Vancomycin  7/31 >> 8/2  Zosyn 7/31 >> 8/2  HPI/Subjective:  Patient more alert and articulate.  Denies pain, shortness of breath, nausea.  Not eating more than a few bites despite decadron.    Objective: Filed Vitals:   05/09/13 1300 05/09/13 2056 05/10/13 0451 05/10/13 0500  BP: 127/66 104/49 119/56   Pulse: 95 80 109   Temp: 97.6 F (36.4 C) 98.3 F (36.8 C) 97.2 F (36.2 C)   TempSrc: Oral Oral Oral   Resp: 22 18 18    Height:      Weight:      SpO2: 100% 100% 89% 98%    Intake/Output Summary (Last 24 hours) at 05/10/13 0854 Last data filed at 05/10/13 0600  Gross per 24 hour  Intake   3420 ml  Output   1554 ml  Net   1866 ml   Filed Weights   05/08/13 0100  Weight: 67.2 kg (148 lb 2.4 oz)    Exam:   General:  CF, alopecia, No acute distress, sitting up in chair, alert and smiling  HEENT:  NCAT, MMM  Cardiovascular:  RRR, nl S1, S2 no mrg, 2+ pulses, warm extremities  Respiratory:  Rales at the bilateral bases that clear somewhat with repeat respirations, no rhonchi or wheeze, no increased WOB  Abdomen:   NABS, soft, NT/ND  MSK:   Normal tone and bulk, no LEE  Neuro:  Grossly moves all extremities  Psych: Alert but not oriented to place or time  Data Reviewed: Basic Metabolic Panel:  Recent Labs Lab 05/06/13 1008 05/07/13 1953 05/08/13 0333 05/09/13 0502 05/10/13 0421  NA 141 123* 137 138 138  K 3.6 2.8* 4.1 3.9 3.8  CL  --  112 103 104 107  CO2 23 25 24 20 21   GLUCOSE 115 127* 132* 156* 144*  BUN 13.4 20 19 18  15   CREATININE 1.0 1.14* 1.13* 1.28* 1.14*  CALCIUM 8.6 8.2* 7.9* 7.3* 7.3*   Liver Function Tests:  Recent Labs Lab 05/06/13 1008 05/07/13 1953 05/08/13 0333  AST 22 23 21   ALT 10 10 8   ALKPHOS 64 70 61  BILITOT 0.61 0.6 0.4  PROT 5.9* 6.0 5.2*  ALBUMIN 2.7* 2.8* 2.5*   No results found for this basename: LIPASE, AMYLASE,  in the last 168 hours No results found for this basename: AMMONIA,  in the last 168 hours CBC:  Recent Labs Lab 05/06/13 1008 05/07/13 1953 05/08/13 0333 05/09/13 0502 05/10/13 0421  WBC 13.2* 15.0* 11.3* 15.8* 11.5*  NEUTROABS 11.0* 12.6*  --   --   --   HGB 9.7* 9.0* 8.2* 8.0* 7.9*  HCT 27.7* 26.7* 23.8* 24.0* 23.6*  MCV 93.1 94.0 94.4 94.5 95.5  PLT 73* 89* 81* 105* 116*   Cardiac Enzymes:  Recent Labs Lab 05/07/13 1953  TROPONINI <0.30   BNP (last 3 results) No results found for this basename: PROBNP,  in the last 8760 hours CBG: No results found for this basename: GLUCAP,  in the last 168 hours  Recent Results (from the past 240 hour(s))  CULTURE, BLOOD (ROUTINE X 2)     Status: None   Collection Time    05/07/13  7:54 PM      Result Value Range Status   Specimen Description BLOOD LEFT ANTECUBITAL   Final   Special Requests     Final   Value: BOTTLES DRAWN AEROBIC AND ANAEROBIC AEB 10CC ANA 12CC   Culture NO GROWTH 2 DAYS   Final   Report Status PENDING   Incomplete  CULTURE, BLOOD (ROUTINE X 2)     Status: None   Collection Time    05/07/13  8:00 PM      Result Value Range Status   Specimen Description BLOOD LEFT HAND   Final   Special Requests     Final   Value: BOTTLES DRAWN AEROBIC AND ANAEROBIC AEB 7CC ANA 5CC   Culture NO GROWTH 2 DAYS   Final   Report Status PENDING   Incomplete  MRSA PCR SCREENING     Status: None   Collection Time    05/08/13  1:15 AM      Result Value Range Status   MRSA by PCR NEGATIVE  NEGATIVE Final   Comment:            The GeneXpert MRSA Assay (FDA     approved for NASAL specimens      only), is one component of a     comprehensive MRSA colonization     surveillance program. It is not     intended to diagnose MRSA     infection nor to guide or  monitor treatment for     MRSA infections.  URINE CULTURE     Status: None   Collection Time    05/08/13  9:30 AM      Result Value Range Status   Specimen Description URINE, RANDOM   Final   Special Requests NONE   Final   Culture  Setup Time 05/08/2013 14:02   Final   Colony Count NO GROWTH   Final   Culture NO GROWTH   Final   Report Status 05/09/2013 FINAL   Final     Studies: No results found.  Scheduled Meds: . dexamethasone  4 mg Oral Q8H  . flecainide  100 mg Oral BID  . folic acid  1 mg Oral Daily  . levETIRAcetam  500 mg Oral BID   Continuous Infusions: . 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 If 7PM-7AM, please contact night-coverage at [email or web address], password [facility] 05/10/2013, 8:54 AM  LOS: 3 days

## 2013-05-10 NOTE — Progress Notes (Signed)
We appreciate Dr Joan Mayans time spent in discussing the patient's status with her family yesterday. Efforts are now being appropriately directed towards hospice care with plan for the patient to be discharged to a hospice residential facility soon. DO NOT RESUSCITATE status established. Exam: She is alert, pleasant, and cooperative but she remains disoriented to person and place. PERRLA. Motor strength 5 over 5. Lungs clear. Regular cardiac rhythm. Extremities no edema Skin: Extensive ecchymosis left arm Impression: #1. Lung cancer metastatic to brain status post palliative radiation #2. Confusion secondary to #1 prompting readmission on July 31. #3. Initial admission for nausea and vomiting July 21. #4. Initial hypokalemia and hyponatremia resolved with hydration and electrolyte replacement #5. Initial profound pancytopenia from chemotherapy White count and platelet count have recovered. She remains anemic. Plan: As outlined above.

## 2013-05-11 MED ORDER — MORPHINE SULFATE 15 MG PO TABS: 15.0000 mg | ORAL_TABLET | ORAL | Status: AC | PRN

## 2013-05-11 MED ORDER — LEVETIRACETAM 500 MG PO TABS: 500.0000 mg | ORAL_TABLET | Freq: Two times a day (BID) | ORAL | Status: AC

## 2013-05-11 MED ORDER — DEXAMETHASONE 4 MG PO TABS
4.0000 mg | ORAL_TABLET | Freq: Two times a day (BID) | ORAL | Status: DC
  Filled 2013-05-11: qty 1

## 2013-05-11 MED ORDER — ENSURE COMPLETE PO LIQD: 237.0000 mL | Freq: Two times a day (BID) | ORAL | Status: AC

## 2013-05-11 MED ORDER — HALOPERIDOL 0.5 MG PO TABS: 0.5000 mg | ORAL_TABLET | Freq: Four times a day (QID) | ORAL | Status: AC | PRN

## 2013-05-11 MED ORDER — DEXAMETHASONE 4 MG PO TABS: 4.0000 mg | ORAL_TABLET | Freq: Two times a day (BID) | ORAL | Status: AC

## 2013-05-11 NOTE — Progress Notes (Signed)
Pt to be d/c to [facility] today.a [facility] transport has been arranged. Pt/family are in agreement with d/c plan.   Cori Razor LCSW [phone]

## 2013-05-11 NOTE — Progress Notes (Signed)
Patient received awake, alert, oriented to person and place. Not in respiratory distress. She was emotional when ambulanced picked her up 5 minutes ago. Emotional support given.

## 2013-05-11 NOTE — Progress Notes (Signed)
CSW spoke with [facility] @ [facility] (ph#: [phone]) re: referral. Sharyl Nimrod is trying to reach patient's sons, [name] (ph#: [phone], [phone], [phone]) and Raiford Noble (ph#: [phone]) to meet with them to discuss hospice. Per Sharyl Nimrod, they have beds available - anticipating possible discharge today. Dr. Malachi Bonds made aware.   Unice Bailey, LCSW [facility] Clinical Social Worker cell #: [phone]

## 2013-05-11 NOTE — Discharge Summary (Signed)
Physician Discharge Summary  [name] OZH:[id number] DOB: 04/08/1951 DOA: 05/07/2013  PCP: Ernestine Conrad, MD  Admit date: 05/07/2013 Discharge date: 05/11/2013  Recommendations for Outpatient Follow-up:  1. Transfer to inpatient hospice for ongoing care 2. Blood cultures 7/31 pending at time of discharge.    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 Patient was discharged to assisted living facility and today was brought for confusion. Patient is confused and unable to provide any significant history. She is full code. Patient also found to be hyponatremic and hypokalemic. As per nursing staff patient had decreased appetite with nausea and diarrhea for 3 weeks.    Hospital Course:   Altered mental status likely due to brain metastases and possible underlying infection, although no infection was clearly identified.  Head CT demonstrated mass replacing the right cerebellum which was stable to mildly enlarged.  She was started on IV decadron and her dose was slowly tapered to 4mg  twice daily which she should continue indefinitely.  She was also given keprra 500mg  BID for seizure prophylaxis.  She was treated with broad spectrum antibiotics initially. Her  urinalysis was negative, chest x-ray negative for infiltrate and blood cultures were negative for recent hospitalization. After 48 hours her antibiotics were discontinued. Her narcotics and benzodiazepines were minimized. She was given Haldol as needed for agitation.  Her mentation slowly improved somewhat and she was able to sit comfortably in a chair for the duration of the day with a soft waist restraint.  She remained oriented to person, but not place or time or situation.  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 She was seen by her oncologist Dr. Arbutus Ped during this admission.  Her family had concerns about her progressive decline, poor appetite, and frequent rehospitalization. They elected for full palliation and to discontinue any palliative chemotherapy or radiation at this time.  Due to her poor appetite and progressive cancer, her prognosis is poor. Her family decided to transfer her to inpatient hospice and arrangements were made for her transfer to [facility].    Chronic shoulder pain:  Continue morphine IR prn.  Leukocytosis trending down as steroids are tapered   Normocytic anemia, hgb trending down, likely secondary marrow suppression from acute illness superimposed on anemia of chronic disease from underlying malignancy.   Thrombocytopenia, resolving. Possibly due to marrow suppression from acute illness versus mild DIC.  She had purpuric rash on arms and extremities.  BUN and creatinine trended down after IVF.  Due to poor appetite, I expect that she will have progressive dehydration.    COPD with episode of tachypnea to the 20s and transient hypoxia  to 89% the day prior to discharge. Minimal cough.  Her chest x-ray demonstrated no evidence of vascular congestion, pulmonary edema, or  pneumonia.  Paroxysmal atrial fibrillation Continued flecainide and bystolic to prevent episodes of a-fib.  No a/c due to brain mets.  Holding asa due to purpura, thrombocytopenia, and high falls risk.  HLD:  Discontinued her statin   Poor appetite  Added supplements and encourage patient to eat.  She requires full assistance for meals.   Consultants:  Oncology, Dr. Arbutus Ped and Dr. Cyndie Chime Procedures:  CT head  CXR Antibiotics:  Vancomycin 7/31 >> 8/2  Zosyn 7/31 >> 8/2   Discharge Exam: Filed Vitals:   05/11/13 0500  BP: 141/76  Pulse: 75  Temp: 97.8 F (36.6 C)  Resp: 22   Filed Vitals:   05/10/13 0500 05/10/13 1330 05/10/13 2100 05/11/13 0500  BP:  139/71 130/67 141/76  Pulse:  89 87 75  Temp:  97.2 F (36.2 C) 98.4 F (36.9 C) 97.8 F (36.6 C)  TempSrc:  Oral Oral Oral  Resp:  20 18 22   Height:      Weight:      SpO2: 98% 97% 97% 96%   States she feels well.  Pleasantly confused. General: CF, alopecia, No acute distress, lying in bed, alert and smiling  HEENT: NCAT, MMM  Cardiovascular: RRR, nl S1, S2 no mrg, 2+ pulses, warm extremities  Respiratory: Rales at the bilateral bases that clear somewhat with repeat respirations, no rhonchi or wheeze, no increased WOB  Abdomen: NABS, soft, NT/ND  MSK: Normal tone and bulk, 1+ bilateral LEE  Neuro: Grossly moves all extremities  Psych: Alert but not oriented to place or time Skin:  Purpuric rash on arms and legs   Discharge Instructions      Discharge Orders   Future Appointments Provider Department Dept Phone   05/20/2013 10:00 AM [facility]  [facility] [phone]   Future Orders Complete By Expires     Call MD for:  difficulty breathing, headache or visual disturbances  As directed     Call MD for:  extreme fatigue  As directed     Call MD for:  hives  As directed     Call MD for:  persistant dizziness or light-headedness  As directed     Call MD for:  persistant  nausea and vomiting  As directed     Call MD for:  severe uncontrolled pain  As directed     Call MD for:  temperature >100.4  As directed     Diet general  As directed     Comments:      Dysphagia 3 with thin liquids    Increase activity slowly  As directed         Medication List    STOP taking these medications       aspirin EC 81 MG tablet     glucose 4 GM chewable tablet     levofloxacin 750 MG tablet  Commonly known as:  LEVAQUIN     meclizine 25 MG tablet  Commonly known as:  ANTIVERT     morphine 15 MG 12 hr tablet  Commonly known as:  MS CONTIN  Replaced by:  morphine 15 MG tablet     oxyCODONE-acetaminophen 10-325 MG per tablet  Commonly known as:  PERCOCET     potassium chloride SA 20 MEQ tablet  Commonly known as:  K-DUR,KLOR-CON     PRESCRIPTION MEDICATION  rosuvastatin 40 MG tablet  Commonly known as:  CRESTOR      TAKE these medications       albuterol 108 (90 BASE) MCG/ACT inhaler  Commonly known as:  PROAIR HFA  Inhale 2 puffs into the lungs every 4 (four) hours as needed for wheezing or shortness of breath.     albuterol (2.5 MG/3ML) 0.083% nebulizer solution  Commonly known as:  PROVENTIL  Take 3 mLs (2.5 mg total) by nebulization every 4 (four) hours as needed for wheezing or shortness of breath.     alum & mag hydroxide-simeth 200-200-20 MG/5ML suspension  Commonly known as:  MAALOX/MYLANTA  Take 30 mLs by mouth every 6 (six) hours as needed.     dexamethasone 4 MG tablet  Commonly known as:  DECADRON  Take 1 tablet (4 mg total) by mouth every 12 (twelve) hours.     feeding supplement Liqd  Take 237 mLs by mouth 2 (two) times daily between meals.     flecainide 100 MG tablet  Commonly known as:  TAMBOCOR  Take 1 tablet (100 mg total) by mouth 2 (two) times daily.     folic acid 1 MG tablet  Commonly known as:  FOLVITE  Take 1 tablet (1 mg total) by mouth daily.     haloperidol 0.5 MG tablet  Commonly known as:  HALDOL   Take 1-2 tablets (0.5-1 mg total) by mouth every 6 (six) hours as needed (agitation).     ipratropium 0.02 % nebulizer solution  Commonly known as:  ATROVENT  Take 2.5 mLs (500 mcg total) by nebulization 4 (four) times daily as needed for wheezing (shortness of breath).     levETIRAcetam 500 MG tablet  Commonly known as:  KEPPRA  Take 1 tablet (500 mg total) by mouth 2 (two) times daily.     MELATONIN ER PO  Take 5 mg by mouth at bedtime.     morphine 15 MG tablet  Commonly known as:  MSIR  Take 1 tablet (15 mg total) by mouth every 4 (four) hours as needed.     nebivolol 2.5 MG tablet  Commonly known as:  BYSTOLIC  Take 2.5 mg by mouth daily.     ondansetron 4 MG tablet  Commonly known as:  ZOFRAN  Take 1 tablet (4 mg total) by mouth every 6 (six) hours as needed for nausea.     polyethylene glycol packet  Commonly known as:  MIRALAX / GLYCOLAX  Take 17 g by mouth daily as needed.     prochlorperazine 10 MG tablet  Commonly known as:  COMPAZINE  Take 1 tablet (10 mg total) by mouth every 6 (six) hours as needed.     traZODone 100 MG tablet  Commonly known as:  DESYREL  Take 100 mg by mouth at bedtime.       Follow-up Information   Follow up with [facility].   Contact information:   [address] [place] [address] [phone]      The results of significant diagnostics from this hospitalization (including imaging, microbiology, ancillary and laboratory) are listed below for reference.    Significant Diagnostic Studies: Ct Head Wo Contrast  05/07/2013   *RADIOLOGY REPORT*  Clinical Data: Altered mental status.  Lung cancer with brain metastasis.  CT HEAD WITHOUT CONTRAST  Technique:  Contiguous axial images were obtained from the base of the skull through the vertex without contrast.  Comparison: January 15, 2013  Findings: There  is heterogeneous density with mass effect replacing the right cerebellum, presumably the  patient's known brain metastasis either unchanged or minimally enlarged compared prior exam.  There is chronic diffuse atrophy.  Chronic bilateral periventricular white matter small vessel ischemic changes identified.  There is no hydrocephalus or midline shift.  No acute transcortical infarct is identified.  IMPRESSION: There is heterogeneous density with mass effect replacing the right cerebellum, presumably the patient's known brain metastasis either unchanged or minimally enlarged compared prior exam. No evidence of acute infarct.   Original Report Authenticated By: Sherian Rein, M.D.   Dg Chest Port 1 View  05/10/2013   *RADIOLOGY REPORT*  Clinical Data: Tachypnea, hypoxia  PORTABLE CHEST - 1 VIEW  Comparison: Chest radiograph dated 05/07/2013.  CT chest dated 02/16/2013.  Findings: Nodular opacity overlying the lateral right lower lung, corresponding to the known right middle lobe nodule on prior CT.  No focal consolidation. No pleural effusion or pneumothorax.  Mild cardiomegaly.  Left subclavian pacemaker.  IMPRESSION: No evidence of acute cardiopulmonary disease.  Nodular opacity overlying the right lower lung, better visualized on prior CT.   Original Report Authenticated By: Charline Bills, M.D.   Dg Chest Port 1 View  05/07/2013   *RADIOLOGY REPORT*  Clinical Data: Antral fibrillation, COPD  PORTABLE CHEST - 1 VIEW  Comparison: 04/27/2013  Findings: Normal heart size and vascularity.  No CHF or pneumonia. Stable right lower lobe nodular opacity, better visualize by comparison CT.  No superimposed pneumonia or CHF.  No effusion or pneumothorax.  Background COPD/emphysema noted.  IMPRESSION: No superimposed acute chest process  Persistent right lung nodule   Original Report Authenticated By: Judie Petit. Miles Costain, M.D.   Dg Chest Port 1 View  04/27/2013   *RADIOLOGY REPORT*  Clinical Data: Evaluate for pneumonia.  Fever, cough.  Neutropenia. History of lung cancer, COPD, asthma, atrial fibrillation.  PORTABLE  CHEST - 1 VIEW  Comparison: 04/15/2013  Findings: Heart is enlarged.  Left-sided pacemaker leads overlie the right atrium and right ventricle.  Nodular density at the right lung base appears stable radiographically.  No new consolidations or pleural effusions are identified.  IMPRESSION: Persistent right lung base nodule. Cardiomegaly.   Original Report Authenticated By: Norva Pavlov, M.D.    Microbiology: Recent Results (from the past 240 hour(s))  CULTURE, BLOOD (ROUTINE X 2)     Status: None   Collection Time    05/07/13  7:54 PM      Result Value Range Status   Specimen Description BLOOD LEFT ANTECUBITAL   Final   Special Requests     Final   Value: BOTTLES DRAWN AEROBIC AND ANAEROBIC AEB 10CC ANA 12CC   Culture NO GROWTH 4 DAYS   Final   Report Status PENDING   Incomplete  CULTURE, BLOOD (ROUTINE X 2)     Status: None   Collection Time    05/07/13  8:00 PM      Result Value Range Status   Specimen Description BLOOD LEFT HAND   Final   Special Requests     Final   Value: BOTTLES DRAWN AEROBIC AND ANAEROBIC AEB 7CC ANA 5CC   Culture NO GROWTH 4 DAYS   Final   Report Status PENDING   Incomplete  MRSA PCR SCREENING     Status: None   Collection Time    05/08/13  1:15 AM      Result Value Range Status   MRSA by PCR NEGATIVE  NEGATIVE Final   Comment:  The GeneXpert MRSA Assay (FDA     approved for NASAL specimens     only), is one component of a     comprehensive MRSA colonization     surveillance program. It is not     intended to diagnose MRSA     infection nor to guide or     monitor treatment for     MRSA infections.  URINE CULTURE     Status: None   Collection Time    05/08/13  9:30 AM      Result Value Range Status   Specimen Description URINE, RANDOM   Final   Special Requests NONE   Final   Culture  Setup Time 05/08/2013 14:02   Final   Colony Count NO GROWTH   Final   Culture NO GROWTH   Final   Report Status 05/09/2013 FINAL   Final      Labs: Basic Metabolic Panel:  Recent Labs Lab 05/06/13 1008 05/07/13 1953 05/08/13 0333 05/09/13 0502 05/10/13 0421  NA 141 123* 137 138 138  K 3.6 2.8* 4.1 3.9 3.8  CL  --  112 103 104 107  CO2 23 25 24 20 21   GLUCOSE 115 127* 132* 156* 144*  BUN 13.4 20 19 18 15   CREATININE 1.0 1.14* 1.13* 1.28* 1.14*  CALCIUM 8.6 8.2* 7.9* 7.3* 7.3*   Liver Function Tests:  Recent Labs Lab 05/06/13 1008 05/07/13 1953 05/08/13 0333  AST 22 23 21   ALT 10 10 8   ALKPHOS 64 70 61  BILITOT 0.61 0.6 0.4  PROT 5.9* 6.0 5.2*  ALBUMIN 2.7* 2.8* 2.5*   No results found for this basename: LIPASE, AMYLASE,  in the last 168 hours No results found for this basename: AMMONIA,  in the last 168 hours CBC:  Recent Labs Lab 05/06/13 1008 05/07/13 1953 05/08/13 0333 05/09/13 0502 05/10/13 0421  WBC 13.2* 15.0* 11.3* 15.8* 11.5*  NEUTROABS 11.0* 12.6*  --   --   --   HGB 9.7* 9.0* 8.2* 8.0* 7.9*  HCT 27.7* 26.7* 23.8* 24.0* 23.6*  MCV 93.1 94.0 94.4 94.5 95.5  PLT 73* 89* 81* 105* 116*   Cardiac Enzymes:  Recent Labs Lab 05/07/13 1953  TROPONINI <0.30   BNP: BNP (last 3 results) No results found for this basename: PROBNP,  in the last 8760 hours CBG: No results found for this basename: GLUCAP,  in the last 168 hours  Time coordinating discharge: 45 minutes  Signed:  Delrose Rohwer  [facility] 05/11/2013, 10:49 AM

## 2013-05-12 LAB — CULTURE, BLOOD (ROUTINE X 2): Culture: NO GROWTH

## 2013-05-14 ENCOUNTER — Ambulatory Visit: Payer: Medicare Other | Admitting: Radiation Oncology

## 2013-05-20 ENCOUNTER — Ambulatory Visit: Payer: Medicare Other

## 2013-06-03 ENCOUNTER — Ambulatory Visit: Payer: Self-pay | Admitting: Pharmacist

## 2013-06-03 DIAGNOSIS — I480 Paroxysmal atrial fibrillation: Secondary | ICD-10-CM

## 2013-06-03 DIAGNOSIS — Z7901 Long term (current) use of anticoagulants: Secondary | ICD-10-CM

## 2013-06-15 ENCOUNTER — Telehealth: Payer: Self-pay | Admitting: *Deleted

## 2013-06-15 NOTE — Telephone Encounter (Signed)
Spoke with family member, patient is with Hospice in [place].

## 2013-07-08 DEATH — deceased

## 2013-08-13 ENCOUNTER — Other Ambulatory Visit: Payer: Self-pay

## 2013-11-11 ENCOUNTER — Encounter: Payer: Self-pay | Admitting: *Deleted

## 2013-11-27 ENCOUNTER — Telehealth: Payer: Self-pay | Admitting: Internal Medicine

## 2013-11-27 NOTE — Telephone Encounter (Signed)
Per pt's sister pt passed away June 30, 2013/mt

## 2013-12-30 ENCOUNTER — Encounter (INDEPENDENT_AMBULATORY_CARE_PROVIDER_SITE_OTHER): Payer: Self-pay | Admitting: *Deleted

## 2014-01-21 ENCOUNTER — Ambulatory Visit (INDEPENDENT_AMBULATORY_CARE_PROVIDER_SITE_OTHER): Payer: Self-pay | Admitting: Internal Medicine

## 2014-02-10 IMAGING — CT CT CHEST W/O CM
2 of 4 series · 15 of 36 positions shown, 18 images · non-contrast
Comparison: PET CT 10/05/2011 and CT chest 09/26/2011.

CLINICAL DATA: Follow-up pulmonary nodule.  Productive cough and
chest congestion.

CT CHEST WITHOUT CONTRAST
TECHNIQUE: Multidetector CT imaging of the chest was performed
following the standard protocol without IV contrast.

[Series 2: chest routine with · axial · 0.66mm/px · z∈[-266,-36]mm · 12 of 56 slices shown, 15 images]
[im 5/56  mediastinal]
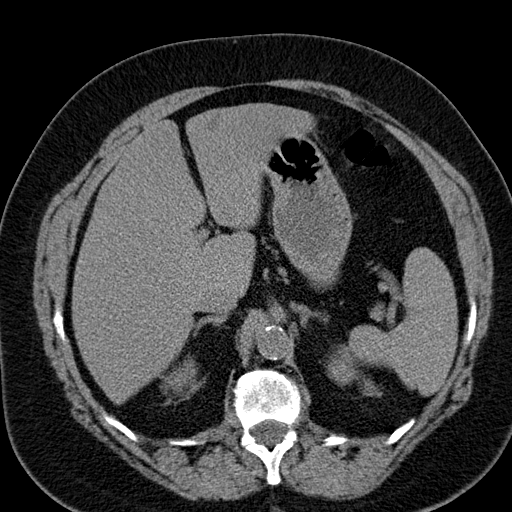
[im 5/56  lung]
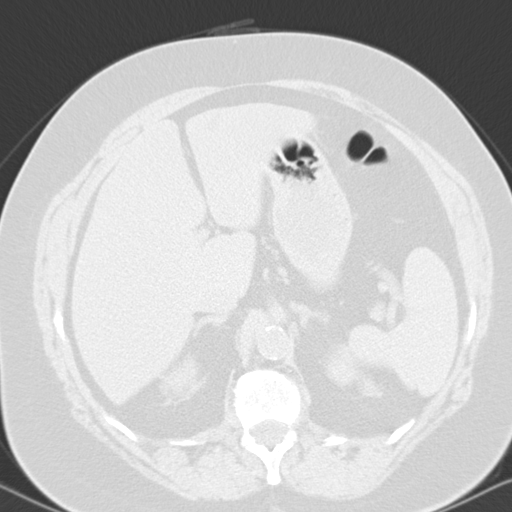
[im 9/56  lung]
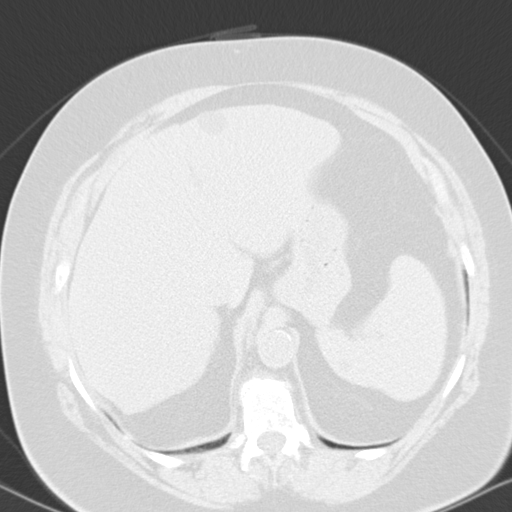
[im 13/56  lung]
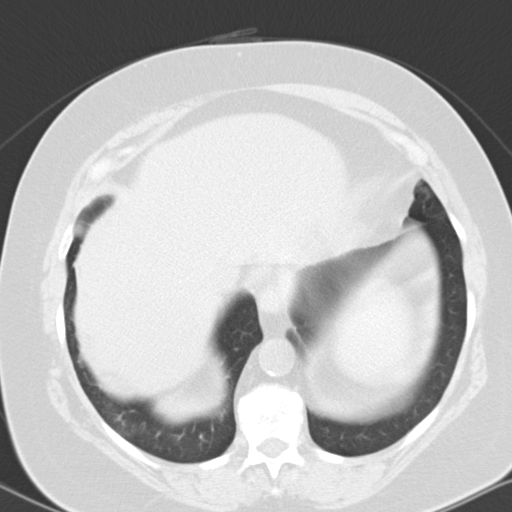
[im 17/56  lung]
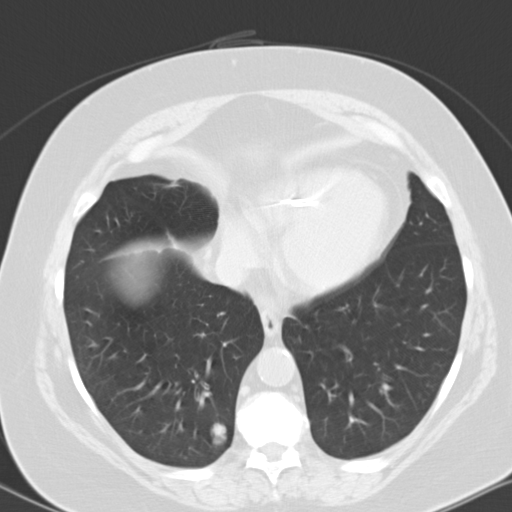
[im 22/56  mediastinal]
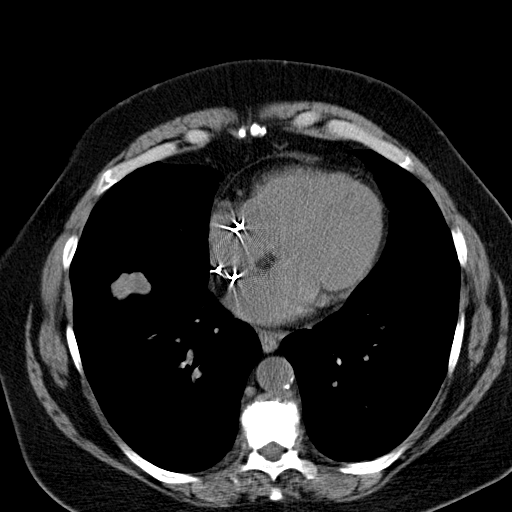
[im 22/56  lung]
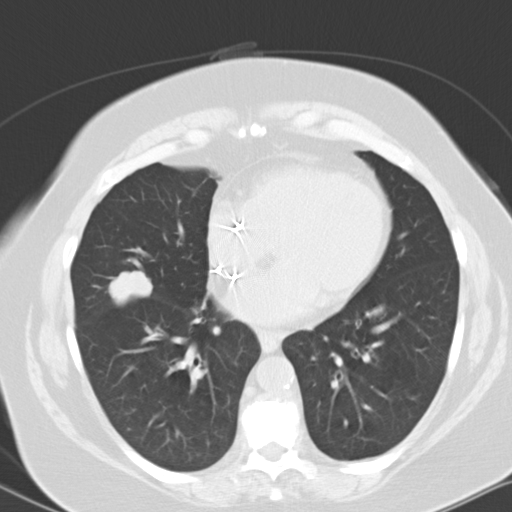
[im 26/56  lung]
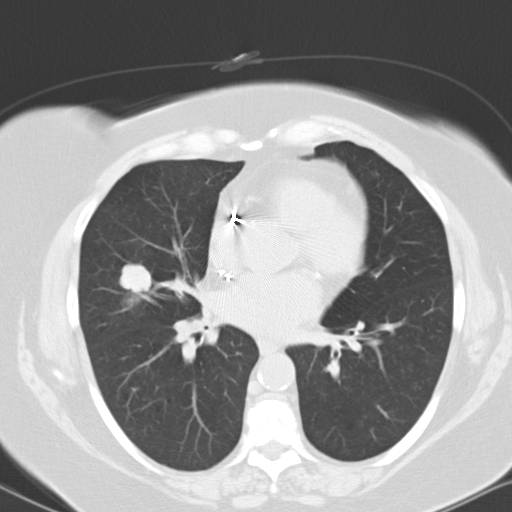
[im 30/56  lung]
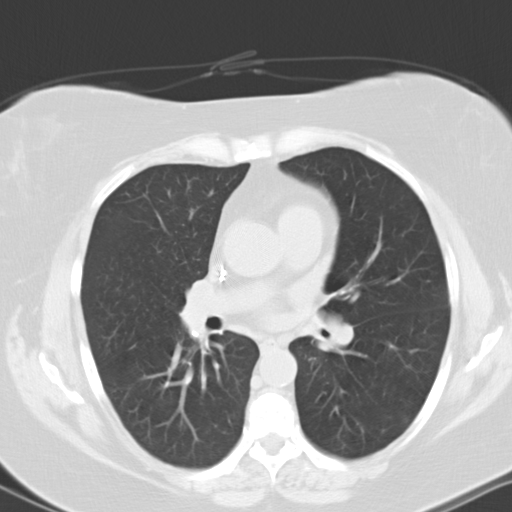
[im 34/56  lung]
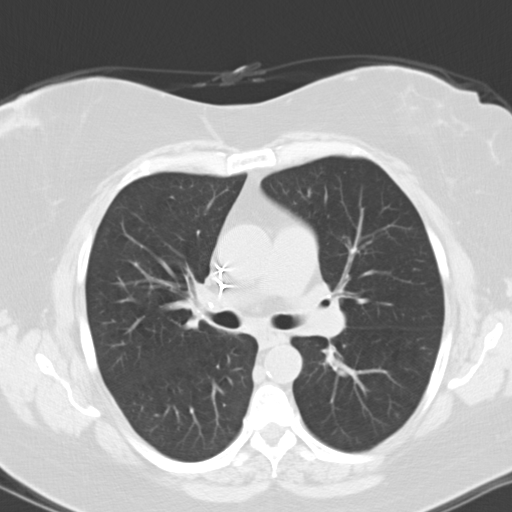
[im 39/56  mediastinal]
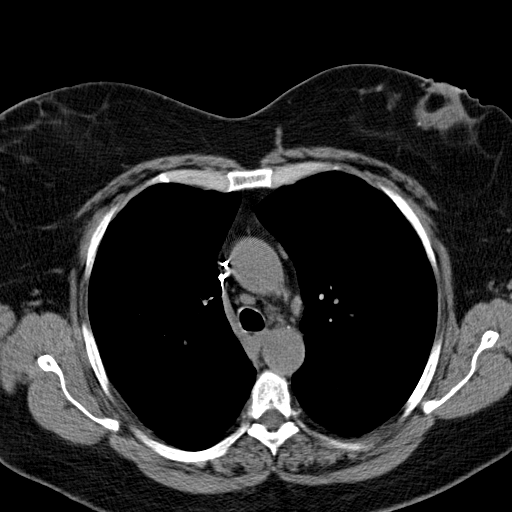
[im 39/56  lung]
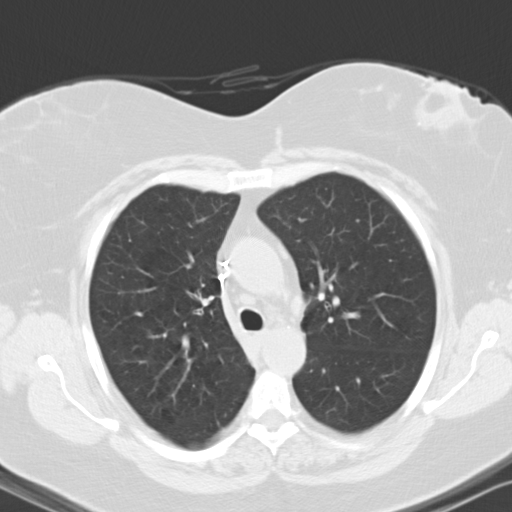
[im 43/56  lung]
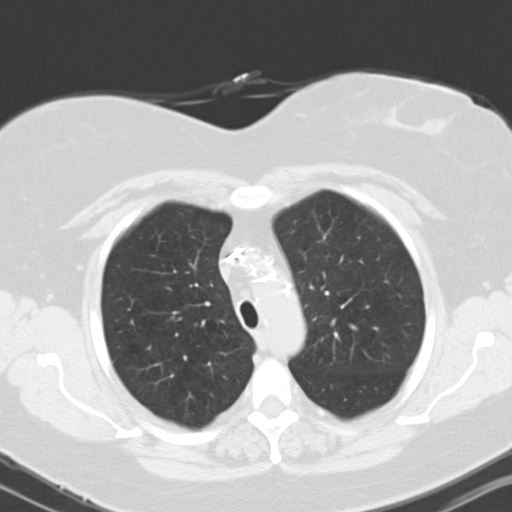
[im 47/56  lung]
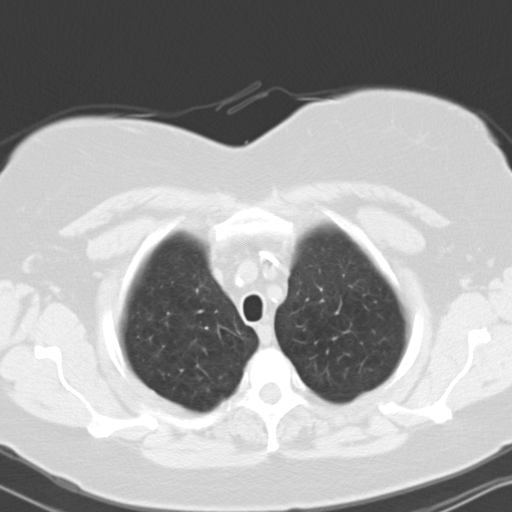
[im 51/56  lung]
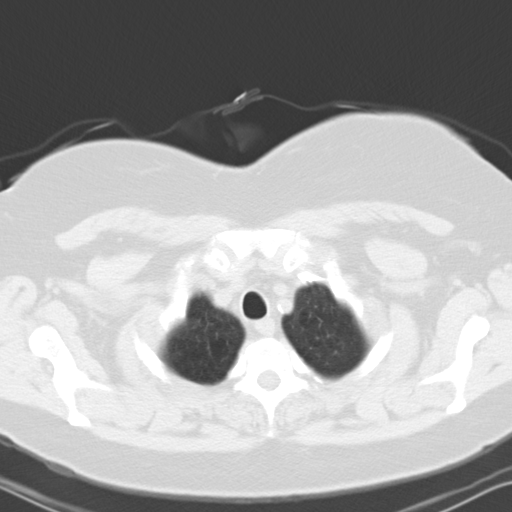

[Series 602: cor · coronal · 0.66mm/px · 3 of 109 slices shown]
[im 22/109  lung]
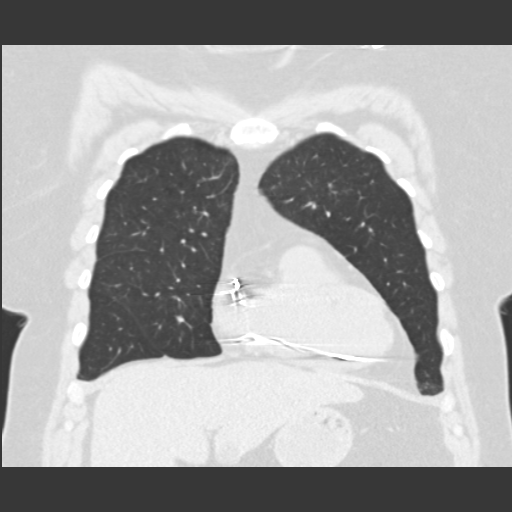
[im 44/109  lung]
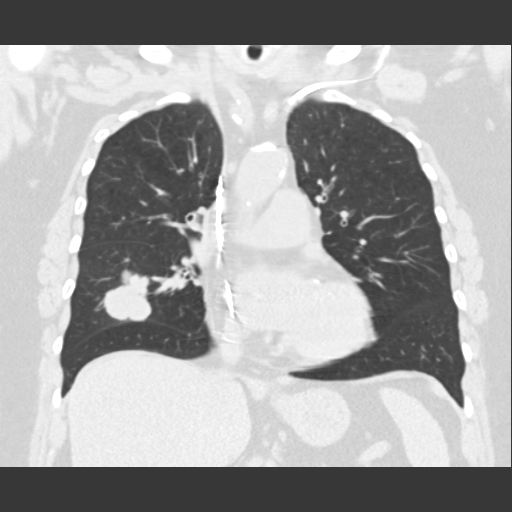
[im 65/109  lung]
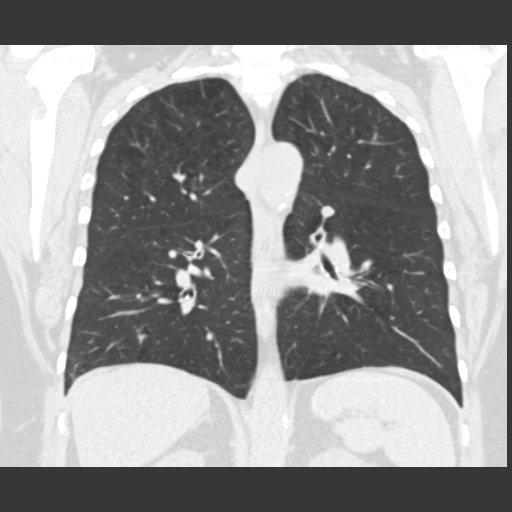

[15 of 36 positions shown; findings below may reference images not displayed]

FINDINGS: Mediastinal lymph nodes are not enlarged by CT size
criteria.  There are calcified right hilar lymph nodes.  Hilar
regions are otherwise difficult to definitively evaluate without IV
contrast.  No axillary adenopathy.  Atherosclerotic calcification
of the arterial vasculature, including coronary arteries.  Heart
size normal.  No pericardial effusion.  Small lymph nodes are seen
along the course of the descending thoracic aorta.

Centrilobular emphysema.  Calcified granuloma in the superior
segment right lower lobe.  A microlobulated mass in the lateral
segment right middle lobe measures 3.1 x 3.5 cm (previously 1.8 x
2.4 cm).  It abuts the right major fissure, retracting is slightly.
A subpleural nodule in the adjacent right middle lobe measures 6 mm
(previously 4 mm).  Heterogeneous nodule in the medial right lower
lobe measures 1.3 x 1.8 cm (previously 1.4 x 1.6 cm).  Curvilinear
subpleural density in the left lower lobe (image 38) is unchanged.
Lungs are otherwise clear.  No pleural fluid.  Airway is
unremarkable.

Incidental imaging of the upper abdomen shows low attenuation
lesions in the liver, measuring up to 2.1 cm in the left hepatic
lobe, as before.  Sclerosis in two contiguous lower thoracic
vertebral bodies is likely due to advanced degenerative disc
disease.  No associated lucency or abnormal soft tissue.
IMPRESSION: Enlarging right middle and right lower lobe nodules, highly
worrisome for synchronous primary bronchogenic carcinomas.

## 2014-06-04 ENCOUNTER — Other Ambulatory Visit: Payer: Self-pay | Admitting: *Deleted
